# Patient Record
Sex: Female | Born: 1935 | Race: White | Hispanic: No | Smoking: Never smoker
Health system: Southern US, Community
[De-identification: ages and names within clinical notes are randomized; demographics above are authoritative.]

## PROBLEM LIST (undated history)

## (undated) DIAGNOSIS — F439 Reaction to severe stress, unspecified: Secondary | ICD-10-CM

## (undated) DIAGNOSIS — R55 Syncope and collapse: Secondary | ICD-10-CM

## (undated) DIAGNOSIS — M549 Dorsalgia, unspecified: Secondary | ICD-10-CM

## (undated) DIAGNOSIS — J42 Unspecified chronic bronchitis: Secondary | ICD-10-CM

## (undated) DIAGNOSIS — C4490 Unspecified malignant neoplasm of skin, unspecified: Secondary | ICD-10-CM

## (undated) DIAGNOSIS — F329 Major depressive disorder, single episode, unspecified: Secondary | ICD-10-CM

## (undated) DIAGNOSIS — R002 Palpitations: Secondary | ICD-10-CM

## (undated) DIAGNOSIS — R42 Dizziness and giddiness: Secondary | ICD-10-CM

## (undated) DIAGNOSIS — R5383 Other fatigue: Secondary | ICD-10-CM

## (undated) DIAGNOSIS — I10 Essential (primary) hypertension: Secondary | ICD-10-CM

## (undated) DIAGNOSIS — F32A Depression, unspecified: Secondary | ICD-10-CM

## (undated) DIAGNOSIS — R200 Anesthesia of skin: Secondary | ICD-10-CM

## (undated) HISTORY — DX: Reaction to severe stress, unspecified: F43.9

## (undated) HISTORY — DX: Other fatigue: R53.83

## (undated) HISTORY — DX: Essential (primary) hypertension: I10

## (undated) HISTORY — DX: Depression, unspecified: F32.A

## (undated) HISTORY — DX: Anesthesia of skin: R20.0

## (undated) HISTORY — DX: Unspecified chronic bronchitis: J42

## (undated) HISTORY — DX: Syncope and collapse: R55

## (undated) HISTORY — DX: Unspecified malignant neoplasm of skin, unspecified: C44.90

## (undated) HISTORY — DX: Dizziness and giddiness: R42

## (undated) HISTORY — PX: APPENDECTOMY: SHX54

## (undated) HISTORY — DX: Dorsalgia, unspecified: M54.9

## (undated) HISTORY — DX: Palpitations: R00.2

## (undated) HISTORY — DX: Major depressive disorder, single episode, unspecified: F32.9

---

## 1986-03-16 DIAGNOSIS — C4491 Basal cell carcinoma of skin, unspecified: Secondary | ICD-10-CM

## 1986-03-16 HISTORY — DX: Basal cell carcinoma of skin, unspecified: C44.91

## 1995-07-11 DIAGNOSIS — C4491 Basal cell carcinoma of skin, unspecified: Secondary | ICD-10-CM

## 1995-07-11 HISTORY — DX: Basal cell carcinoma of skin, unspecified: C44.91

## 1998-06-08 ENCOUNTER — Other Ambulatory Visit: Admission: RE | Admit: 1998-06-08 | Discharge: 1998-06-08 | Payer: Self-pay | Admitting: Obstetrics and Gynecology

## 1998-07-28 ENCOUNTER — Encounter: Payer: Self-pay | Admitting: Emergency Medicine

## 1998-07-28 ENCOUNTER — Emergency Department (HOSPITAL_COMMUNITY): Admission: EM | Admit: 1998-07-28 | Discharge: 1998-07-28 | Payer: Self-pay | Admitting: Emergency Medicine

## 1998-08-02 ENCOUNTER — Encounter: Payer: Self-pay | Admitting: Emergency Medicine

## 1998-08-02 ENCOUNTER — Emergency Department (HOSPITAL_COMMUNITY): Admission: EM | Admit: 1998-08-02 | Discharge: 1998-08-02 | Payer: Self-pay

## 1998-11-17 ENCOUNTER — Encounter: Admission: RE | Admit: 1998-11-17 | Discharge: 1998-12-17 | Payer: Self-pay | Admitting: Neurology

## 2000-03-20 ENCOUNTER — Encounter: Payer: Self-pay | Admitting: Family Medicine

## 2000-03-20 ENCOUNTER — Encounter: Admission: RE | Admit: 2000-03-20 | Discharge: 2000-03-20 | Payer: Self-pay | Admitting: Family Medicine

## 2000-03-30 ENCOUNTER — Encounter: Payer: Self-pay | Admitting: Orthopedic Surgery

## 2000-03-30 ENCOUNTER — Encounter: Admission: RE | Admit: 2000-03-30 | Discharge: 2000-03-30 | Payer: Self-pay | Admitting: Orthopedic Surgery

## 2000-06-21 ENCOUNTER — Other Ambulatory Visit: Admission: RE | Admit: 2000-06-21 | Discharge: 2000-06-21 | Payer: Self-pay | Admitting: Obstetrics and Gynecology

## 2001-01-24 ENCOUNTER — Encounter: Admission: RE | Admit: 2001-01-24 | Discharge: 2001-01-24 | Payer: Self-pay | Admitting: *Deleted

## 2001-01-24 ENCOUNTER — Encounter: Payer: Self-pay | Admitting: *Deleted

## 2001-07-29 ENCOUNTER — Other Ambulatory Visit: Admission: RE | Admit: 2001-07-29 | Discharge: 2001-07-29 | Payer: Self-pay | Admitting: Obstetrics and Gynecology

## 2003-07-24 ENCOUNTER — Encounter: Admission: RE | Admit: 2003-07-24 | Discharge: 2003-07-24 | Payer: Self-pay | Admitting: Internal Medicine

## 2003-08-06 ENCOUNTER — Encounter: Admission: RE | Admit: 2003-08-06 | Discharge: 2003-08-06 | Payer: Self-pay | Admitting: Internal Medicine

## 2004-01-06 ENCOUNTER — Encounter: Admission: RE | Admit: 2004-01-06 | Discharge: 2004-01-06 | Payer: Self-pay | Admitting: Internal Medicine

## 2004-10-12 ENCOUNTER — Encounter: Admission: RE | Admit: 2004-10-12 | Discharge: 2004-10-12 | Payer: Self-pay | Admitting: Family Medicine

## 2004-11-13 ENCOUNTER — Inpatient Hospital Stay (HOSPITAL_COMMUNITY): Admission: EM | Admit: 2004-11-13 | Discharge: 2004-11-15 | Payer: Self-pay | Admitting: *Deleted

## 2004-11-14 ENCOUNTER — Encounter (INDEPENDENT_AMBULATORY_CARE_PROVIDER_SITE_OTHER): Payer: Self-pay | Admitting: *Deleted

## 2005-05-15 ENCOUNTER — Ambulatory Visit (HOSPITAL_COMMUNITY): Admission: RE | Admit: 2005-05-15 | Discharge: 2005-05-15 | Payer: Self-pay | Admitting: Neurosurgery

## 2005-07-07 ENCOUNTER — Ambulatory Visit: Payer: Self-pay | Admitting: Emergency Medicine

## 2005-07-14 ENCOUNTER — Encounter: Admission: RE | Admit: 2005-07-14 | Discharge: 2005-07-14 | Payer: Self-pay | Admitting: Gastroenterology

## 2005-08-14 ENCOUNTER — Encounter: Admission: RE | Admit: 2005-08-14 | Discharge: 2005-08-14 | Payer: Self-pay | Admitting: Gastroenterology

## 2005-09-01 ENCOUNTER — Ambulatory Visit: Admission: RE | Admit: 2005-09-01 | Discharge: 2005-09-01 | Payer: Self-pay | Admitting: Gynecology

## 2005-09-06 ENCOUNTER — Encounter (INDEPENDENT_AMBULATORY_CARE_PROVIDER_SITE_OTHER): Payer: Self-pay | Admitting: *Deleted

## 2005-09-06 ENCOUNTER — Ambulatory Visit (HOSPITAL_COMMUNITY): Admission: RE | Admit: 2005-09-06 | Discharge: 2005-09-06 | Payer: Self-pay | Admitting: Obstetrics and Gynecology

## 2005-09-21 ENCOUNTER — Encounter: Admission: RE | Admit: 2005-09-21 | Discharge: 2005-09-21 | Payer: Self-pay | Admitting: Gastroenterology

## 2005-09-27 ENCOUNTER — Ambulatory Visit: Payer: Self-pay | Admitting: Internal Medicine

## 2005-09-29 ENCOUNTER — Ambulatory Visit: Payer: Self-pay | Admitting: Cardiology

## 2005-10-27 ENCOUNTER — Ambulatory Visit: Payer: Self-pay | Admitting: Internal Medicine

## 2006-01-25 ENCOUNTER — Ambulatory Visit: Payer: Self-pay | Admitting: Internal Medicine

## 2006-05-15 ENCOUNTER — Encounter: Admission: RE | Admit: 2006-05-15 | Discharge: 2006-05-15 | Payer: Self-pay | Admitting: Orthopedic Surgery

## 2006-08-28 ENCOUNTER — Ambulatory Visit: Payer: Self-pay | Admitting: Internal Medicine

## 2007-08-28 DIAGNOSIS — R059 Cough, unspecified: Secondary | ICD-10-CM | POA: Insufficient documentation

## 2007-08-28 DIAGNOSIS — R05 Cough: Secondary | ICD-10-CM

## 2007-08-28 DIAGNOSIS — J479 Bronchiectasis, uncomplicated: Secondary | ICD-10-CM

## 2007-08-28 HISTORY — DX: Bronchiectasis, uncomplicated: J47.9

## 2007-08-29 ENCOUNTER — Ambulatory Visit: Payer: Self-pay | Admitting: Internal Medicine

## 2007-09-04 ENCOUNTER — Telehealth (INDEPENDENT_AMBULATORY_CARE_PROVIDER_SITE_OTHER): Payer: Self-pay | Admitting: *Deleted

## 2007-09-16 ENCOUNTER — Ambulatory Visit: Payer: Self-pay | Admitting: Internal Medicine

## 2007-09-30 ENCOUNTER — Encounter (INDEPENDENT_AMBULATORY_CARE_PROVIDER_SITE_OTHER): Payer: Self-pay | Admitting: *Deleted

## 2007-10-01 ENCOUNTER — Ambulatory Visit: Payer: Self-pay | Admitting: Pulmonary Disease

## 2007-10-16 ENCOUNTER — Ambulatory Visit: Payer: Self-pay | Admitting: Internal Medicine

## 2007-12-26 ENCOUNTER — Ambulatory Visit (HOSPITAL_COMMUNITY): Admission: RE | Admit: 2007-12-26 | Discharge: 2007-12-26 | Payer: Self-pay | Admitting: Gastroenterology

## 2007-12-26 ENCOUNTER — Encounter (INDEPENDENT_AMBULATORY_CARE_PROVIDER_SITE_OTHER): Payer: Self-pay | Admitting: Diagnostic Radiology

## 2008-11-19 ENCOUNTER — Ambulatory Visit (HOSPITAL_COMMUNITY): Admission: RE | Admit: 2008-11-19 | Discharge: 2008-11-19 | Payer: Self-pay | Admitting: Urology

## 2008-12-10 ENCOUNTER — Encounter: Admission: RE | Admit: 2008-12-10 | Discharge: 2008-12-10 | Payer: Self-pay | Admitting: Neurosurgery

## 2009-01-11 ENCOUNTER — Encounter: Admission: RE | Admit: 2009-01-11 | Discharge: 2009-01-11 | Payer: Self-pay | Admitting: Neurosurgery

## 2009-01-28 ENCOUNTER — Encounter: Admission: RE | Admit: 2009-01-28 | Discharge: 2009-01-28 | Payer: Self-pay | Admitting: Neurosurgery

## 2009-03-09 ENCOUNTER — Encounter: Admission: RE | Admit: 2009-03-09 | Discharge: 2009-03-09 | Payer: Self-pay | Admitting: Urology

## 2009-04-21 ENCOUNTER — Ambulatory Visit (HOSPITAL_COMMUNITY): Admission: RE | Admit: 2009-04-21 | Discharge: 2009-04-21 | Payer: Self-pay | Admitting: Diagnostic Radiology

## 2009-04-27 ENCOUNTER — Telehealth (INDEPENDENT_AMBULATORY_CARE_PROVIDER_SITE_OTHER): Payer: Self-pay | Admitting: *Deleted

## 2009-04-28 ENCOUNTER — Ambulatory Visit: Payer: Self-pay | Admitting: Internal Medicine

## 2010-01-17 ENCOUNTER — Ambulatory Visit: Payer: Self-pay | Admitting: Cardiovascular Disease

## 2010-04-04 ENCOUNTER — Ambulatory Visit: Payer: Self-pay | Admitting: Cardiovascular Disease

## 2010-07-26 ENCOUNTER — Other Ambulatory Visit: Payer: Self-pay | Admitting: Gastroenterology

## 2010-07-26 DIAGNOSIS — R1012 Left upper quadrant pain: Secondary | ICD-10-CM

## 2010-07-29 ENCOUNTER — Ambulatory Visit
Admission: RE | Admit: 2010-07-29 | Discharge: 2010-07-29 | Disposition: A | Discharge status: Home / Self Care | Payer: Medicare Other | Source: Ambulatory Visit | Attending: Gastroenterology | Admitting: Gastroenterology

## 2010-07-29 DIAGNOSIS — R1012 Left upper quadrant pain: Secondary | ICD-10-CM

## 2010-07-29 MED ORDER — IOHEXOL 300 MG/ML  SOLN
100.0000 mL | Freq: Once | INTRAMUSCULAR | Status: AC | PRN
  Administered 2010-07-29: 100 mL via INTRAVENOUS

## 2010-08-09 ENCOUNTER — Ambulatory Visit: Payer: Medicare Other | Admitting: Internal Medicine

## 2010-08-18 ENCOUNTER — Other Ambulatory Visit: Payer: Self-pay | Admitting: Internal Medicine

## 2010-08-18 ENCOUNTER — Encounter: Payer: Self-pay | Admitting: Internal Medicine

## 2010-08-18 ENCOUNTER — Ambulatory Visit (INDEPENDENT_AMBULATORY_CARE_PROVIDER_SITE_OTHER): Payer: Medicare Other | Admitting: Internal Medicine

## 2010-08-18 ENCOUNTER — Ambulatory Visit (INDEPENDENT_AMBULATORY_CARE_PROVIDER_SITE_OTHER)
Admission: RE | Admit: 2010-08-18 | Discharge: 2010-08-18 | Disposition: A | Discharge status: Home / Self Care | Payer: Medicare Other | Source: Ambulatory Visit | Attending: Internal Medicine | Admitting: Internal Medicine

## 2010-08-18 ENCOUNTER — Other Ambulatory Visit: Payer: Medicare Other

## 2010-08-18 DIAGNOSIS — R05 Cough: Secondary | ICD-10-CM

## 2010-08-18 DIAGNOSIS — J479 Bronchiectasis, uncomplicated: Secondary | ICD-10-CM

## 2010-08-18 DIAGNOSIS — R053 Chronic cough: Secondary | ICD-10-CM

## 2010-08-18 DIAGNOSIS — R059 Cough, unspecified: Secondary | ICD-10-CM

## 2010-08-18 LAB — CBC WITH DIFFERENTIAL/PLATELET
Basophils Relative: 0.2 % (ref 0.0–3.0)
Eosinophils Relative: 2.8 % (ref 0.0–5.0)
HCT: 40.3 % (ref 36.0–46.0)
Lymphs Abs: 1.7 10*3/uL (ref 0.7–4.0)
MCV: 93.2 fl (ref 78.0–100.0)
Monocytes Absolute: 0.4 10*3/uL (ref 0.1–1.0)
Neutrophils Relative %: 65.7 % (ref 43.0–77.0)
RBC: 4.33 Mil/uL (ref 3.87–5.11)
WBC: 6.7 10*3/uL (ref 4.5–10.5)

## 2010-08-18 LAB — CONVERTED CEMR LAB: IgE (Immunoglobulin E), Serum: 2 intl units/mL (ref 0.0–180.0)

## 2010-08-23 NOTE — Assessment & Plan Note (Signed)
Summary: Pulmonary/ ext f/u ovfor bronchiectasis   Primary Provider/Referring Provider:  Izola Price  CC:  Cough and left side pain.  History of Present Illness: 11 yowf  never smoker  with recurrent cough  since age 77 with right middle lobe/lingular syndrome with limited bronchiectatic changes on her most recent CT study dated 09/2005   April 28, 2009 Acute visit.  Pt c/o dry cough worse since September occ yellow.  rec:  Levaquin 750 x 5 days Prednisone 4 each am x 2days, 2x2days, 1x2days and stop Vicodin 5  one every 4 hours for excess cough or pain Pneumonia shot today. Last one you'll ever need unless the CDC guidelines change Flu shot  today.   August 18, 2010 ov for  bronchiectasis / chronic cough worse x 6 months and LUQ pain worse when lie down present 24 hours per day x year already neg abd CT Scan >  mucus is clear assoc with nasal drainage also clear, worse in ams with mild nasal congestion.    Pt denies any significant sore throat, dysphagia, itching, sneezing,    fever, chills, sweats, unintended wt loss, classically pleuritic or exertional cp, hempoptysis, change in activity tolerance  orthopnea pnd or leg swelling. Pt also denies any obvious fluctuation in symptoms with weather or environmental change or other alleviating or aggravating factors.           Current Medications (verified): 1)  Adult Aspirin Low Strength 81 Mg  Tbdp (Aspirin) .... Once Daily 2)  Multivitamins   Tabs (Multiple Vitamin) .... Once Daily 3)  Vitamin D 2000 Unit  Tabs (Cholecalciferol) .... Once Daily 4)  Calcium 500 500 Mg  Tabs (Calcium Carbonate) .... Two Times A Day 5)  Tylenol 325 Mg Tabs (Acetaminophen) .... As Directed As Needed 6)  Vicodin 5-500 Mg Tabs (Hydrocodone-Acetaminophen) .... One Every 4 Hours As Needed  Allergies (verified): 1)  ! Pcn 2)  ! Sulfa  Past History:  Past Medical History: BRONCHIECTASIS (ICD-494.0) see CT scan of the chest dated 09/29/2005     - Pneumovax  April 28, 2009  (over 65 so last shot)     - Alpha one Screen sent August 18, 2010 >>>     - Allergy eval August 18, 2010 >>>     - Sinus CT August 18, 2010 >> neg  COUGH, CHRONIC (ICD-786.2)  Left Kidney Cyst        -  04/21/09 [facility]    PROCEDURE(S): ULTRASOUND GUIDED AND FLUOROSCOPIC GUIDED LEFT                      RENAL CYST ASPIRATION AND SCLEROSIS LUQ Abd pain 2011...............................................................Marland KitchenSchooler       - CT ABD  07/29/10  no etiology  Vital Signs:  Patient profile:   77 year old female Weight:      137 pounds BMI:     23.24 O2 Sat:      99 % on Room air Temp:     97.7 degrees F oral Pulse rate:   64 / minute BP sitting:   114 / 70  (left arm)  Vitals Entered By: Vernie Murders (August 18, 2010 2:29 PM)  O2 Flow:  Room air  Physical Exam  Additional Exam:  thin somewhat anxious but pleasant ambulatory white female in no acute distress. wt 137 > 137 April 28, 2009 > August 18, 2010 137  HEENT mild turbinate edema.  Oropharynx no thrush or excess pnd or cobblestoning.  No JVD or cervical adenopathy. Mild accessory muscle hypertrophy. Trachea midline, nl thryroid. Chest was hyperinflated by percussion with diminished breath sounds and moderate increased exp time without wheeze. Hoover sign positive at mid inspiration. Regular rate and rhythm without murmur gallop or rub or increase P2.  no edema Abd: no hsm, nl excursion. Ext warm without cyanosis or clubbing.       White Cell Count          6.7 K/uL                    4.5-10.5   Red Cell Count            4.33 Mil/uL                 3.87-5.11   Hemoglobin                14.0 g/dL                   16.1-09.6   Hematocrit                40.3 %                      36.0-46.0   MCV                       93.2 fl                     78.0-100.0   MCHC                      34.6 g/dL                   04.5-40.9   RDW                       12.9 %                      11.5-14.6   Platelet Count             203.0 K/uL                  150.0-400.0   Neutrophil %              65.7 %                      43.0-77.0   Lymphocyte %              25.4 %                      12.0-46.0   Monocyte %                5.9 %                       3.0-12.0   Eosinophils%              2.8 %                       0.0-5.0   Basophils %               0.2 %  0.0-3.0   Neutrophill Absolute      4.4 K/uL                    1.4-7.7   Lymphocyte Absolute       1.7 K/uL                    0.7-4.0   Monocyte Absolute         0.4 K/uL                    0.1-1.0  Eosinophils, Absolute                             0.2 K/uL                    0.0-0.7   Basophils Absolute        0.0 K/uL                    0.0-0.1  Tests: (2) Sed Rate (ESR)   Sed Rate                  10 mm/hr                    0-22  CXR  Procedure date:  08/18/2010  Findings:      Comparison: 04/28/2009, CT 09/29/2005  Findings: Right middle lobe scarring and bronchiectasis is unchanged.  There is also some mild scarring in the left lung base and in the right lung base.  No acute pneumonia.  Negative for heart failure.  IMPRESSION: COPD and scarring.  Right middle lobe bronchiectasis.  No superimposed pneumonia.   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   I had an extended discussion with the patient today lasting 15 to 20 minutes of a 25 minute visit on the following issues:   there has been no clinical or radiographic progression of her dz no hemoptysis/ sepsis or other complications to date using a relatively conservative approach  vs repeat fob/ search for MAI or resistant gnr involvement which may someday be necessary but not for now  Discussed in detail all the  indications, usual  risks and alternatives  relative to the  benefits with patient who agrees to proceed with conservative rx   Problem # 2:  COUGH, CHRONIC (ICD-786.2) The most common causes of chronic cough in immunocompetent adults include: upper airway cough syndrome (UACS), previously referred to as postnasal drip syndrome,  caused by variety of rhinosinus conditions; (2) asthma; (3) GERD; (4) chronic bronchitis from cigarette smoking or other inhaled environmental irritants; (5) nonasthmatic eosinophilic bronchitis; and (6) bronchiectasis. These conditions, singly or in combination, have accounted for up to 94% of the causes of chronic cough in prospective studies.   Most likely this is bronchiectasis related but could certainly be also  Classic Upper airway cough syndrome, so named because it's frequently impossible to sort out how much is  CR/sinusitis with freq throat clearing (which can be related to primary GERD)   vs  causing  secondary extra esophageal GERD from wide swings in gastric pressure that occur with throat clearing, promoting self use of mint and menthol lozenges that reduce the lower  esophageal sphincter tone and exacerbate the problem further These are the same pts who not infrequently have failed to tolerate ace inhibitors,  dry powder inhalers or biphosphonates or report having reflux symptoms that don't respond to standard doses of PPI   For now try to keep it simple by adding hs h2  and h1 and w/u for allergy with rast.  Medications Added to Medication List This Visit: 1)  Tagamet Hb 200 Mg Tabs (Cimetidine) .... 2 at bedtime 2)  Chlor-trimeton 4 Mg Tabs (Chlorpheniramine maleate) .... One at bedtime  Other Orders: T-2 View CXR (71020TC) TLB-Alpha-1-Antitrypsin Total ( Home Kit) MISC ([address]) T-Allergy Profile Region [place], [place], [place], [place], [place] (5484) Est. Patient Level IV ([address]454) Misc. Referral (Misc. Ref) TLB-CBC Platelet - w/Differential (85025-CBCD) TLB-Sedimentation Rate (ESR) (85652-ESR)  Patient Instructions: 1)   Chlortrimeton 4 mg one at bedtime 2)  Tagamet 400mg  at bedtime 3)  See Patient Care Coordinator before leaving for sinus ct 4)  Please schedule a follow-up appointment in 6 weeks, sooner if needed

## 2010-09-07 ENCOUNTER — Telehealth: Payer: Self-pay | Admitting: Internal Medicine

## 2010-09-07 LAB — CBC
HCT: 40.6 % (ref 36.0–46.0)
Hemoglobin: 13.9 g/dL (ref 12.0–15.0)
RDW: 12.7 % (ref 11.5–15.5)

## 2010-09-07 NOTE — Telephone Encounter (Signed)
Alpha 1 test was negative. ATC pt and inform her this.  LMTCB.

## 2010-09-08 ENCOUNTER — Encounter: Payer: Self-pay | Admitting: Internal Medicine

## 2010-09-08 NOTE — Telephone Encounter (Signed)
Returning [name]'s call ok to lmom.

## 2010-09-12 LAB — CBC
HCT: 45.3 % (ref 36.0–46.0)
MCV: 94.6 fL (ref 78.0–100.0)
Platelets: 230 10*3/uL (ref 150–400)
RBC: 4.8 MIL/uL (ref 3.87–5.11)
WBC: 7 10*3/uL (ref 4.0–10.5)

## 2010-09-12 NOTE — Telephone Encounter (Signed)
Called, spoke with pt.  She was informed alpha 1 test negative.  She verbalized understanding of this and voiced no further questions at this time.

## 2010-09-12 NOTE — Telephone Encounter (Signed)
Pt returned call from leslie. Cell [phone]. [name]

## 2010-09-27 ENCOUNTER — Encounter: Payer: Self-pay | Admitting: Internal Medicine

## 2010-09-29 ENCOUNTER — Ambulatory Visit (INDEPENDENT_AMBULATORY_CARE_PROVIDER_SITE_OTHER): Payer: Medicare Other | Admitting: Internal Medicine

## 2010-09-29 ENCOUNTER — Encounter: Payer: Self-pay | Admitting: Internal Medicine

## 2010-09-29 VITALS — BP 108/60 | HR 78 | Temp 97.8°F | Ht 64.0 in | Wt 137.0 lb

## 2010-09-29 DIAGNOSIS — J479 Bronchiectasis, uncomplicated: Secondary | ICD-10-CM

## 2010-09-29 NOTE — Patient Instructions (Signed)
GERD (REFLUX)  is an extremely common cause of respiratory symptoms, many times with no significant heartburn at all.    It can be treated with medication, but also with lifestyle changes including avoidance of late meals, excessive alcohol, smoking cessation, and avoid fatty foods, chocolate, peppermint, colas, red wine, and acidic juices such as orange juice.  NO MINT OR MENTHOL PRODUCTS SO NO COUGH DROPS  USE SUGARLESS CANDY INSTEAD (jolley ranchers or Stover's)  Or Lifefsavers NO OIL BASED VITAMINS   Return yearly for cxr  - if cough worsens use mucinex dm and if not satisfied return for further evaluation

## 2010-09-29 NOTE — Progress Notes (Signed)
  Subjective:    Patient ID: [name], female    DOB: 01/09/1933, 77 y.o.   MRN: [id number]  HPI Summary: Pulmonary/ ext f/u ovfor bronchiectasis  Primary Provider/Referring Provider: Izola Price    16  yowf never smoker with recurrent cough since age 72 with right middle lobe/lingular syndrome with limited bronchiectatic changes on her most recent CT study dated 09/2005   April 28, 2009 Acute visit. Pt c/o dry cough worse since September occ yellow.  rec: Levaquin 750 x 5 days  Prednisone 4 each am x 2days, 2x2days, 1x2days and stop  Vicodin 5 one every 4 hours for excess cough or pain  Pneumonia @ ov = last one unless the CDC guidelines change  Flu shot today.   August 18, 2010 ov for bronchiectasis / chronic cough worse x 6 months and LUQ pain worse when lie down present 24 hours per day x year already neg abd CT Scan > mucus is clear assoc with nasal drainage also clear, worse in ams with mild nasal congestion. Sinus ct neg so REC 1) Chlortrimeton 4 mg one at bedtime  2) Tagamet 400mg  at bedtime    09/29/2010 ov [name]/ cc cough much better,  No sob.   Pt denies any significant sore throat, dysphagia, itching, sneezing,  nasal congestion or excess/ purulent secretions,  fever, chills, sweats, unintended wt loss, pleuritic or exertional cp, hempoptysis, orthopnea pnd or leg swelling.    Also denies any obvious fluctuation of symptoms with weather or environmental changes or other aggravating or alleviating factors.     Past Medical History:  BRONCHIECTASIS (ICD-494.0) see CT scan of the chest dated 09/29/2005  - Pneumovax April 28, 2009 (over 67 so last shot)  - Alpha one Screen sent August 18, 2010 >>>  Neg - Sinus CT August 18, 2010 >>  neg  COUGH, CHRONIC (ICD-786.2)  Left Kidney Cyst  - 04/21/09 [facility] PROCEDURE(S): ULTRASOUND GUIDED AND FLUOROSCOPIC GUIDED LEFT RENAL CYST ASPIRATION AND SCLEROSIS  LUQ Abd pain  2011...............................................................Marland KitchenSchooler  - CT ABD 07/29/10 no etiology              Review of Systems     Objective:   Physical Exam    thin pleasant ambulatory white female in no acute distress.  wt 137 > 137 April 28, 2009 > August 18, 2010 137 >  137 09/29/2010  HEENT mild turbinate edema. Oropharynx no thrush or excess pnd or cobblestoning. No JVD or cervical adenopathy. Mild accessory muscle hypertrophy. Trachea midline, nl thryroid. Chest was hyperinflated by percussion with diminished breath sounds and moderate increased exp time without wheeze. Hoover sign positive at mid inspiration. Regular rate and rhythm without murmur gallop or rub or increase P2. no edema Abd: no hsm, nl excursion. Ext warm without cyanosis or clubbing.   08/18/10 data reviewed Sed Rate 10 mm/hr 0-22  CXR   Comparison: 04/28/2009, CT 09/29/2005  Findings: Right middle lobe scarring and bronchiectasis is  unchanged. There is also some mild scarring in the left lung base  and in the right lung base. No acute pneumonia. Negative for  heart failure.  IMPRESSION:  COPD and scarring. Right middle lobe bronchiectasis. No  superimposed pneumonia. Assessment & Plan:

## 2010-09-29 NOTE — Assessment & Plan Note (Signed)
Symptoms well controlled on present rx    Each maintenance medication was reviewed in detail including most importantly the difference between maintenance and as needed and under what circumstances the prns are to be used.  Please see instructions for details which were reviewed in writing and the patient given a copy.

## 2010-10-19 ENCOUNTER — Encounter: Payer: Self-pay | Admitting: Cardiovascular Disease

## 2010-10-19 DIAGNOSIS — R55 Syncope and collapse: Secondary | ICD-10-CM | POA: Insufficient documentation

## 2010-10-19 DIAGNOSIS — F439 Reaction to severe stress, unspecified: Secondary | ICD-10-CM | POA: Insufficient documentation

## 2010-10-19 DIAGNOSIS — J42 Unspecified chronic bronchitis: Secondary | ICD-10-CM | POA: Insufficient documentation

## 2010-10-19 DIAGNOSIS — F329 Major depressive disorder, single episode, unspecified: Secondary | ICD-10-CM | POA: Insufficient documentation

## 2010-10-19 DIAGNOSIS — I10 Essential (primary) hypertension: Secondary | ICD-10-CM | POA: Insufficient documentation

## 2010-10-19 DIAGNOSIS — R002 Palpitations: Secondary | ICD-10-CM | POA: Insufficient documentation

## 2010-10-19 DIAGNOSIS — R5383 Other fatigue: Secondary | ICD-10-CM | POA: Insufficient documentation

## 2010-10-19 DIAGNOSIS — M549 Dorsalgia, unspecified: Secondary | ICD-10-CM | POA: Insufficient documentation

## 2010-10-19 DIAGNOSIS — F32A Depression, unspecified: Secondary | ICD-10-CM | POA: Insufficient documentation

## 2010-10-19 DIAGNOSIS — C4490 Unspecified malignant neoplasm of skin, unspecified: Secondary | ICD-10-CM | POA: Insufficient documentation

## 2010-10-19 DIAGNOSIS — R42 Dizziness and giddiness: Secondary | ICD-10-CM | POA: Insufficient documentation

## 2010-10-19 DIAGNOSIS — R200 Anesthesia of skin: Secondary | ICD-10-CM | POA: Insufficient documentation

## 2010-10-20 ENCOUNTER — Encounter: Payer: Self-pay | Admitting: Cardiovascular Disease

## 2010-10-20 ENCOUNTER — Ambulatory Visit (INDEPENDENT_AMBULATORY_CARE_PROVIDER_SITE_OTHER): Payer: Medicare Other | Admitting: Cardiovascular Disease

## 2010-10-20 VITALS — BP 136/78 | HR 68 | Ht 64.0 in | Wt 136.4 lb

## 2010-10-20 DIAGNOSIS — R002 Palpitations: Secondary | ICD-10-CM

## 2010-10-20 NOTE — Patient Instructions (Signed)
Try Zertec for allergies.

## 2010-10-20 NOTE — Assessment & Plan Note (Signed)
She is doing very well. She has not had any significant palpitations. She would like to see me again in 6 months.

## 2010-10-20 NOTE — Progress Notes (Signed)
[name] Date of Birth  [birth year] [facility] [address]     [address] [place], [place]  [address] [phone]  Fax  [phone]  History of Present Illness:  [name] is an elderly female with a history of palpitations and history of dizziness. She has mild hypertension. She's done very well since I last saw her. She has not had any episodes of chest pain or shortness of breath. She has had some leg discomfort particularly on busy days.  Current Outpatient Prescriptions on File Prior to Visit  Medication Sig Dispense Refill  . aspirin 81 MG tablet Take 81 mg by mouth daily.        Marland Kitchen CALCIUM PO Take 1,500 mg by mouth daily.        . Cholecalciferol (VITAMIN D) 2000 UNITS CAPS Take by mouth daily.        . Multiple Vitamin (MULTIVITAMIN) tablet Take 1 tablet by mouth daily.        Marland Kitchen DISCONTD: chlorpheniramine (CHLOR-TRIMETON) 4 MG tablet Take 4 mg by mouth at bedtime.        Marland Kitchen DISCONTD: cimetidine (TAGAMET) 200 MG tablet 2 tablets at bedtime         Allergies  Allergen Reactions  . Penicillins   . Sulfonamide Derivatives     Past Medical History  Diagnosis Date  . Palpitations   . Syncope     History of   . Dizziness   . Hypertension     mild  . Back pain   . Depression   . Stress   . Fatigue   . Arm numbness left   . Skin cancer   . Bronchitis, chronic     Past Surgical History  Procedure Date  . Appendectomy     History  Smoking status  . Never Smoker   Smokeless tobacco  . Never Used    History  Alcohol Use  . Yes    social ETOH    Family History  Problem Relation Age of Onset  . Atrial fibrillation Mother   . Alcohol abuse Father     Reviw of Systems:  Reviewed in the HPI.  All other systems are negative.  Physical Exam: BP 136/78  Pulse 68  Ht 5\' 4"  (1.626 m)  Wt 136 lb 6.4 oz (61.871 kg)  BMI 23.41 kg/m2 The patient is alert and oriented x 3.  The mood and affect are normal.  The skin is  warm and dry.  Color is normal.  The HEENT exam reveals that the sclera are nonicteric.  The mucous membranes are moist.  The carotids are 2+ without bruits.  There is no thyromegaly.  There is no JVD.  The lungs are clear.  The chest wall is non tender.  The heart exam reveals a regular rate with a normal S1 and S2.  There are no murmurs, gallops, or rubs.  The PMI is not displaced.   Abdominal exam reveals good bowel sounds.  There is no guarding or rebound.  There is no hepatosplenomegaly or tenderness.  There are no masses.  Exam of the legs reveal no clubbing, cyanosis, or edema.  The legs are without rashes.  The distal pulses are intact.  Cranial nerves II - XII are intact.  Motor and sensory functions are intact.  The gait is normal.  Assessment / Plan:

## 2010-10-21 NOTE — Op Note (Signed)
[name], [name]             ACCOUNT NO.:  [id number]   MEDICAL RECORD NO.:  [id number]          PATIENT TYPE:  AMB   LOCATION:  SDC                           FACILITY:  [facility]   PHYSICIAN:  Sherry A. Dickstein, M.D.DATE OF BIRTH:  06-27-1932   DATE OF PROCEDURE:  09/06/2005  DATE OF DISCHARGE:                                 OPERATIVE REPORT   PREOPERATIVE DIAGNOSIS:  Left ovarian cyst, left abdominal pain.   POSTOPERATIVE DIAGNOSIS:  Left ovarian cyst, left abdominal pain.   PROCEDURE:  Diagnostic laparoscopy with bilateral salpingo-oophorectomy.   SURGEON:  Dr. Rosalio Macadamia.  Dr. Delia Heady.   ANESTHESIA:  General.   INDICATIONS:  This is a 77 year old G3, P 3-0-0-3 woman who has been having  increasing left lower quadrant pain with left lower back pain.  The patient  has had a known left ovarian cyst several years previously which apparently  disappeared on ultrasound; however, over the past several months, the  patient has had increased abdominal pain.  The patient recently had an  ultrasound which revealed a 3 cm left ovarian cyst.  This appeared to be  simple in nature.  The patient's right ovary had less than 1 cm ovarian  cyst.  The patient was sent for consultation with GYN oncologist for  possibility of having this performed at a center where a periaortic node  dissection could be performed if this were a malignancy.  Because of the  simple nature of the cyst and the unlikelihood that this were malignancy,  the patient declined having it done there.  Because of the size of the cyst  and the persistence of the pain, the patient is brought to the operating  room for bilateral salpingo-oophorectomy.   FINDINGS:  Normal sized anteflexed uterus with a 1 cm fibroid off of the  fundus.  The right fallopian tube and ovary normal, left fallopian tube  normal, left ovary approximately 4 to 5 cm in size.   PROCEDURE:  The patient is brought into the operating room, given  adequate  general anesthesia.  She is placed in dorsal lithotomy position.  Her  abdomen and perineum were washed with Betadine.  Her vagina was then washed  with Betadine.  A Foley catheter was inserted into the bladder and a pelvic  examination was performed.  A speculum placed within the vagina.  Single-  tooth Hulka tenaculum placed in the endocervical canal in anteflexed  fashion.  Surgeon's gown and gloves were changed.  The patient was draped in  sterile fashion.  Subumbilical area was infiltrated with 0.25%  Marcaine and  incision was made in the subumbilical area.  This was brought down to the  fascia.  The fascia was grasped and was incised sharply.  Peritoneal cavity  was entered sharply.  The fascial edges were identified and a pursestring  stitch was taken circumferentially.  The Hasson sleeve was introduced into  the peritoneal cavity and it was cinched down with the pursestring stitch.  Carbon dioxide was insufflated.  The pelvis was inspected by placing the  laparoscope.  Pictures were obtained.  A right  5 mm port was placed after  infiltrating with 0.25% Marcaine.  Incision was made.  The trocar was  introduced under direct visualization.  A left 11 mm incision was made after  infiltrating with 0.25% Marcaine.  Under direct visualization, a 11 mm  trocar was placed.  500 mL of Ringer's lactate was introduced into the  peritoneal cavity.  A washings were obtained approximately 400 mL of fluid  were removed.  The pelvis was completely inspected.  Both ureters were  identified.  The right infundibulopelvic ligament was identified.  It was  cauterized with tripolar cautery.  It was cut. The right adnexa was then  continued be dissected using tripolar cautery and cutting in this fashion  over to the utero-ovarian ligament.  This was cut and cauterized.  The right  tube and ovary was then dropped in the cul-de-sac. There were few adhesions  of the bowel underneath the uterus.   These were just dissected free with  blunt dissection.  The left infundibulopelvic ligament was then felt to be  freed enough to cauterize.  It was clamped with tripolar, it was cauterized  x3 and cut in the middle, then it was continued to be cauterized and cut  over to the utero-ovarian ligament and it was cauterized and cut in this  fashion until it was completely free.  There were some small bleeders along  the cauterized portion.  This was very carefully cauterized and it was  completely free from the bowel while it was being cauterized.  Adequate  hemostasis was present throughout this area.  The left tube and ovary was  then placed in EndoCatch bag. It was brought out through the 11 mm port on  the left.  As was brought out, the fluid was drained off.  It was felt to be  very serous clear fluid and then the tube and ovary was able to be removed  easily through the port.  A new EndoCatch bag was then replaced into the  abdomen and right tube and ovary was placed within this bag.  It was easily  brought out through the port.  The trocar was then replaced.  The pelvis was  completely inspected.  There was adequate hemostasis throughout.  The pelvis  was irrigated.  All the fluid was removed that could be removed.  Approximately 40 mL of 0.25% Marcaine was left in the abdominal cavity for  pain relief postoperatively.  The 11 mm sleeve was removed.  Kocher clamps  were placed on the fascia.  The fascia was closed with a 0 Vicryl figure-of-  eight stitch.  The carbon dioxide was allowed to escape and under direct  visualization, the laparoscope was removed.  The umbilical incision was  closed over a finger to assure that no bowel was caught in the umbilical  stitch.  The pursestring stitch that had been placed was cinched down and  closed.  The skin incisions were closed with 4-0 Vicryl subcuticular running  stitches.  All incisions were then closed with Dermabond.  It was felt  that adequate hemostasis was present throughout.  The tenaculum was removed from  the cervix.  The Foley catheter was to be removed in the recovery room.  The  patient was taken out of dorsal lithotomy position.  She was awakened.  She  was extubated.  She was moved from the operating table to a stretcher in  stable condition.  Complications were none.  Estimated blood loss less than  50 mL.   SPECIMEN:  1.  Right tube and ovary.  2.  Left tube and ovary.      Sherry A. Rosalio Macadamia, M.D.  Electronically Signed     SAD/MEDQ  D:  09/06/2005  T:  09/07/2005  Job:  [id number]   cc:   De Blanch, M.D.  [address]  [place]  [place] [address]

## 2010-10-21 NOTE — Discharge Summary (Signed)
NAMETEEGHAN, HAMMER             ACCOUNT NO.:  [id number]   MEDICAL RECORD NO.:  [id number]          PATIENT TYPE:  INP   LOCATION:  1618                         FACILITY:  [facility]   PHYSICIAN:  Hollice Espy, M.D.DATE OF BIRTH:  29-May-1933   DATE OF ADMISSION:  11/13/2004  DATE OF DISCHARGE:  11/15/2004                                 DISCHARGE SUMMARY   CONSULTANTS ON THIS CASE:  Althea Grimmer. Luther Parody, M.D., [facility].   PRIMARY CARE PHYSICIAN:  [name], M.D.   DISCHARGE DIAGNOSES:  1.  Ischemic colitis.  2.  General complaints of joint pain.   DISCHARGE MEDICATIONS:  Aspirin 325 mg 1 p.o. daily.   HOSPITAL COURSE:  The patient is a 77 year old white female with essentially  no significant past medical history who presented with worsening abdominal  pain in the left lower quadrant as well as some rectal bleeding on the day  of admission.  She came in for further evaluation.  A CT of the abdomen and  pelvis confirmed colitis within the transverse distal region as well as in  the sigmoid area.  The patient was made n.p.o., started on IV antibiotics  for possible infectious colitis.  Stool cultures were sent, which ended up  being negative.  [facility], Dr. Luther Parody, came by to evaluate the patient,  and she underwent a colonoscopy on November 14, 2004.  At that time, he found  evidence of ischemic colitis from the transverse to the descending colon,  suspected erosions, bleeding, and granularity were present with those  ulcers.  Noted some mild friability and muscle-sparing biopsies were taken.  Patient otherwise following the CT is feeling a little bit better although  still quite fatigued.  Dr. Luther Parody recommended starting the patient on a  daily aspirin and discharging her home on a low-residue diet.  The patient  was given information about this diet, which she understands and will  follow.  Plan will be for the patient to be discharged to home.   OVERALL DISPOSITION:   Improved.   She will follow up with Dr. Luther Parody in his office in two to three weeks.  Following that, she will follow up with her PCP, Dr. Lenise Arena, in  approximately four weeks.  The patient, prior to these issues, has told me  for the last month to two months she has been complaining of problems with  pain and numbness over the left side of her body, with involvement of the  head all the way down to her feet.  She also is complaining of significantly  of some left shoulder  pain and bilateral hip pain.  Sed rate was checked, which was found to  actually be normal at 2, thereby likely ruling out a rheumatologic cause.  Dr. Lenise Arena, after this colitis has settled down, may want to follow up on  this to pursue other possible avenues for this.       SKK/MEDQ  D:  11/15/2004  T:  11/15/2004  Job:  [id number]   cc:   [name], M.D.  [address]  [place] [address]  Fax: [phone]   Althea Grimmer. Luther Parody, M.D.  [address]  [place]  [place] [address]  Fax: [phone]

## 2010-10-21 NOTE — Assessment & Plan Note (Signed)
[name]                               PULMONARY OFFICE NOTE   [name], [name]                      MRN:          [id number]  DATE:01/25/2006                            DOB:          [birth year]    HISTORY:  A 77 year old white female with documented bronchiectasis with  bilateral chronic infiltrates involving the right middle lobe and lingula  for which I contemplate the possibility of MAI or resistant organism  infection.  However, the patient has done quite well since her previous  visit using Mucinex DM two b.i.d. and Cipro 750 b.i.d. for 7 days p.r.n.  (she rarely needs either of these, however).  She denies any dyspnea or  chest pain.  She __________ a significant cough.   PHYSICAL EXAMINATION:  GENERAL APPEARANCE:  She is a pleasant ambulatory  white female in no acute distress.  VITAL SIGNS:  Afebrile with stable vital signs.  HEENT:  Unremarkable.  Oropharynx clear.  LUNGS:  Lung fields reveal slightly diminished breath sounds but no  wheezing.  CARDIOVASCULAR:  There is regular rhythm without murmurs, rubs, or gallops.  ABDOMEN:  Soft and benign.  EXTREMITIES:  Warm without calf tenderness, clubbing, cyanosis, or edema.   PFTs performed today and reviewed with the patient and indicated an FEV1 and  FVC ratio of 67% with no improvement after bronchodilators, diffusing  capacity of 81%.   Chest x-ray reveals scarring in the lingula and right middle lobe.   IMPRESSION:  Well compensated bronchiectasis with minimal air flow  obstruction and therefore does not need more aggressive treatment at this  point.   I did recommend follow-up in February of 2008, for an annual chest x-ray to  track the changes that are seen but in the absence of refractory purulent  sputum or increased dyspnea, I do not believe there is any change in regimen  needed.                                   Charlaine Dalton. Sherene Sires, MD, [facility]   MBW/MedQ  DD:   01/25/2006  DT:  01/26/2006  Job #:  [id number]   cc:   [name], MD

## 2010-10-21 NOTE — Consult Note (Signed)
NAMEPERRI, ARAGONES             ACCOUNT NO.:  [id number]   MEDICAL RECORD NO.:  [id number]          PATIENT TYPE:  INP   LOCATION:  1618                         FACILITY:  [facility]   PHYSICIAN:  Althea Grimmer. Santogade, M.D.DATE OF BIRTH:  May 25, 1933   DATE OF CONSULTATION:  11/13/2004  DATE OF DISCHARGE:                                   CONSULTATION   HISTORY OF PRESENT ILLNESS:  Ms. Logsdon is a 77 year old female who was in  her reasonably good state of health until early this morning when she had  cramping lower abdominal pain and three attacks of diarrhea which she  describes as bright red blood mixed with the stool. She has had chronic left  lower quadrant abdominal discomfort previously evaluated with CT scans and  these were negative. She had an appointment with me this week to set up  colonoscopy but came to the emergency room because of recurrent symptoms.   In the ER, she had a CT scan that demonstrates thick wall distal transverse  and sigmoid colon. She denies any recent travel, any ill associates, any  fevers or chills. She has been recently seeing Dr. Genene Churn. Love for left-  sided body numbness and pain and he just placed her on Relafen but she is  only taking two doses. She has not had any recent antibiotics. She denies  weight loss or upper GI symptoms.   PAST MEDICAL HISTORY:  Pertinent for cervical and lumbar disc disease and a  reported heart murmur.   CURRENT MEDICATIONS:  Relafen, Excedrin, and Vicodin. She is supposed to be  taking a baby aspirin which she usually forgets by her report.   ALLERGIES:  PENICILLIN and SULFA.   FAMILY HISTORY:  Married Advertising account planner, nonsmoker and nondrinker.   REVIEW OF SYMPTOMS:  GENERAL:  No weight loss or night sweats. ENDOCRINE:  No history of diabetes or thyroid problems. SKIN:  No rash or pruritus.  EYES:  No icterus or change in vision. ENT:  No aphthous ulcers or chronic  sore throat. RESPIRATORY:  No shortness of  breath, cough, or wheezing.  CARDIAC:  Reported murmur. No chest pain. GI:  As above. GU:  No dysuria or  hematuria.   FAMILY HISTORY:  Mother who is a patient of mine had nonsteroidal-induced  gastrointestinal bleeding. Her uncle has Crohn's colitis. Her father had  ulcers. No one has had colonic neoplasia.   PHYSICAL EXAMINATION:  GENERAL:  She is in no acute distress.  VITAL SIGNS:  Afebrile. Blood pressure 119/67, pulse 73 and regular.  SKIN:  Normal.  HEENT:  Eyes are anicteric. Conjunctivae are pink. Oropharynx unremarkable.  CHEST:  Sounds clear.  HEART:  Regular rate and rhythm. I do not appreciate a murmur.  ABDOMEN:  She has audible bowel sounds. Soft with active bowel sounds. There  is minimal left lower quadrant tenderness to deep palpation. There is no  mass, rebound, or hernia.  RECTAL:  Not performed.  EXTREMITIES:  Without clubbing, cyanosis, edema, or rash.   LABORATORY DATA:  Hemoglobin 14, white blood count 7.9, platelet count  normal. Sedimentation rate  2. Electrolytes and liver function tests normal.  Albumin 3.3.   IMPRESSION:  Left-sided colitis in a 77 year old female. I doubt this is due  to the new institution of nonsteroidals since she is only taking two pills.  I suspect this is ischemic. There is no risk factor for C. difficile. She  does have a family history of Crohn's and nonsteroidal induced bowel disease  and Crohn's does remain a possibility.   RECOMMENDATIONS:  I think she requires a colonoscopy on this admission. I am  uncertain of the timing since the schedule is very packed tomorrow. I would  keep her on clear liquids and continuous low dose of MiraLax to potentially  keep her cleaned out when ever we can schedule it on Monday or Tuesday.  Please see the orders.       PJS/MEDQ  D:  11/13/2004  T:  11/13/2004  Job:  [id number]   cc:   Theone Stanley, MD   [name], M.D.  [address] [place]  [place] [address]  Fax:  [phone]

## 2010-10-21 NOTE — Assessment & Plan Note (Signed)
[facility]                             PULMONARY OFFICE NOTE   Rita, Lane                      MRN:          [id number]  DATE:08/28/2006                            DOB:          03-27-1933    HISTORY:  A 77 year old white female with right middle lobe/lingular  syndrome with limited bronchiectatic changes on her most recent study  dated August 2007.  She returns today continuing to use Cipro and  Mucinex on a p.r.n. basis but has not needed any since her last visit in  August 2007.  She is complaining of jaw pain for which she saw a  dentist yesterday who apparently did x-rays but was not convinced the  pain was related to her teeth.  She denies any difficulty with chewing  or hearing or obvious nasal or sinus syndromes.   She also denies any pleuritic pain, purulent sputum, fever, chills,  sweats, weight loss, or dyspnea.   She is on no maintenance medications, no pulmonary medications at all at  this point.   PHYSICAL EXAMINATION:  GENERAL:  She is a pleasant healthy-appearing  white female in no acute distress.  VITAL SIGNS:  Stable.  HEENT:  Unremarkable.  Pharynx clear.  Dentition is intact.  There was  no tenderness over the TMJ on the right, and ear canal was also  completely normal including the tympanic membrane.  There was no  associated adenopathy.  LUNGS:  Lung fields are completely clear bilaterally to auscultation and  percussion.  HEART:  Regular rhythm without murmur, gallop, or rub.  ABDOMEN:  Soft, benign.  EXTREMITIES:  Normal without calf tenderness, cyanosis, clubbing or  edema.   IMPRESSION:  1. Right jaw pain, acute onset over the last week, probably most      likely dental related but no evidence of active upper respiratory      tract infection including no evidence of otitis or sinusitis      clinically.  2. Silent bronchiectasis involving the right middle lobe and      lingula.  She is certainly at risk  for exacerbations but is doing      great on a very consistent regimen and has no evidence of      significant airflow obstruction warranting more aggressive therapy,      as documented on her previous study dated January 25, 2006.  Most      likely, this is attributed to the fact that she has never smoked.  3. Followup should be yearly with yearly chest x-ray, therefore, which      we completed today, the results of which are pending.    [name]. Rita Sires, MD, [facility]  Electronically Signed   MBW/MedQ  DD: 08/28/2006  DT: 08/28/2006  Job #: [id number]   cc:   [name], M.D.

## 2010-10-21 NOTE — Consult Note (Signed)
[name], [name]             ACCOUNT NO.:  [id number]   MEDICAL RECORD NO.:  [id number]          PATIENT TYPE:  OUT   LOCATION:  GYN                          FACILITY:  [facility]   PHYSICIAN:  De Blanch, M.D.DATE OF BIRTH:  1932/11/30   DATE OF CONSULTATION:  09/01/2005  DATE OF DISCHARGE:  09/01/2005                                   CONSULTATION   CHIEF COMPLAINT:  Ovarian cyst.   HISTORY OF PRESENT ILLNESS:  A 77 year old white female seen in consultation  at the request of Dr. Floyde Parkins regarding management of bilateral  ovarian cysts.  The patient apparently has had ovarian cysts recognized as  far back as 2004 on ultrasound.  Apparently she had an ultrasound in 2005  which showed resolution of the cysts.   More recently, the patient has had an achy left lower quadrant pain and  has undergone GI workup.  The details of that workup are not available but  she claims the only thing noted was diverticulosis.  In addition, the  patient has had an ultrasound obtained which shows the left ovary to have  contained a simple cyst measuring 3.3 x 2.2 x 2.2 cm and a small 1 cm cyst  in the right ovary.  There is no increased blood flow with  the Doppler  studies and no free fluid noted in the cul-de-sac.  CA125 was also obtained  and is 7 units per mL.   The patient denies any vaginal bleeding or discharge and has no apparent  uterine problems.   PAST MEDICAL HISTORY:  Medical illnesses:  The patient claims to have a  heart murmur.   PAST SURGICAL HISTORY:  1.  Cesarean section x 3.  2.  Laparotomy for ruptured appendix.  3.  Tonsils and adenoidectomy.   CURRENT MEDICATIONS:  Baby aspirin.   DRUG ALLERGIES:  PENICILLIN AND SULFA.   FAMILY HISTORY:  Cousin with lung cancer and a cousin with colon cancer.   SOCIAL HISTORY:  The patient is separated for 27 years.  She remains active  as a Scientist, water quality.  She does not smoke.   REVIEW OF  SYSTEMS:  A 10-point comprehensive review of systems negative  except as noted above.   PHYSICAL EXAM:  Weight 132 pounds, height 5 foot 4, blood pressure 120/70,  pulse 80, respiratory rate 20.  GENERAL:  The patient is a healthy white female in no acute distress.  HEENT:  Negative.  NECK:  Supple without thyromegaly.  There is no supraclavicular or inguinal  adenopathy.  ABDOMEN:  Soft, nontender.  No masses, organomegaly, ascites or hernias are  noted.  She has as well-healed midline no interval change.  PELVIC:  EG, BUS, vagina, bladder and urethra are normal but atrophic.  Cervix is atrophic.  Uterus is anterior, normal shape, size and consistency.  I do not feel any adnexal masses.   The patient's laboratory work and scans are reviewed.   IMPRESSION:  Simple bilateral ovarian cysts.  Whether the achy pain in the  left lower quadrant is associated with the left adnexal cyst is uncertain.  I do not think the patient has ovarian cancer based on all the  characteristics noted above and therefore would recommend she undergo  laparoscopic bilateral salpingo-oophorectomy under the care of Dr.  Rosalio Macadamia.  Certainly if she were to have the rare occurrence of an ovarian  cancer, completion of surgical staging with a second laparoscopy would be  very reasonable.   We also discussed the pros and cons of performing a hysterectomy and the  patient is inclined not to extend her surgical procedure any further than  necessary.   She will return to the care of Dr. Rosalio Macadamia and is scheduled to go ahead  with surgery next Wednesday.      De Blanch, M.D.  Electronically Signed     DC/MEDQ  D:  09/01/2005  T:  09/04/2005  Job:  [id number]   cc:   Sherry A. Rosalio Macadamia, M.D.  Fax: [phone]   Telford Nab, R.N.  [address]  [place], [place] [address]

## 2010-10-21 NOTE — H&P (Signed)
[name], [name]             ACCOUNT NO.:  [id number]   MEDICAL RECORD NO.:  [id number]          PATIENT TYPE:  INP   LOCATION:  1618                         FACILITY:  [facility]   PHYSICIAN:  Hollice Espy, M.D.DATE OF BIRTH:  06/22/1932   DATE OF ADMISSION:  11/13/2004  DATE OF DISCHARGE:                                HISTORY & PHYSICAL   PRIMARY CARE PHYSICIAN:  [name], M.D.   CHIEF COMPLAINT:  Abdominal pain, diarrhea, and rectal bleeding.   HISTORY OF PRESENT ILLNESS:  The patient is a 77 year old white female with  past medical history only of DJD and cervical arthritis, who for the past  six months to a year has had episodes of intermittent abdominal pain.  Workup has always been negative, including CT scans.  The patient has also  undergone a flexible sigmoidoscopy in the past with annual Hemoccult which  have been always normal.  For the past few days she has had some worsening  abdominal pain, mostly located in the left lower quadrant.  No associated  nausea or vomiting or abdominal pain elsewhere.  She has been able to  tolerate p.o. and doing well, and then today she started having problems  with diarrhea this morning followed by noticeable rectal bleeding.  She  became quite concerned and came into the emergency room.  She tells me that  normally her bowels move on a regular basis without issue.  Currently she  still complains of some abdominal discomfort, despite being given doses of  morphine.  She denies any other symptoms.  She also has noted in the last  few weeks she has been complaining of intermittent, vague complaints of  pain, including left shoulder pain, neck pain, headache, scalp pain,  bilateral hip pain, and numbness in her left foot, and she has had problems  with bilateral carpal tunnel.  She tells me this has all been going on  within the last month or so.   REVIEW OF SYSTEMS:  Review of systems for now is otherwise negative.  She  has no chest pain or shortness of breath.  No hematuria, dysuria.  No  constipation.  No vision changes or dysphagia.   PAST MEDICAL HISTORY:  1.  Includes intermittent episodes of abdominal pain.  2.  Cervical arthritis.  3.  DJD of the lower spine.   MEDICATIONS:  She has been on Relafen p.r.n., but otherwise is not on any  medications.   PAST SURGICAL HISTORY:  She has had three C-sections.   SOCIAL HISTORY:  She denies tobacco or drug use.  She drinks alcohol on  social occasions.   FAMILY HISTORY:  Noted for an uncle with Crohn's disease.   ALLERGIES:  1.  PENICILLIN.  2.  SULFA.   PHYSICAL EXAMINATION:  VITAL SIGNS:  Temperature 98.2, blood pressure  142/74, respirations 18, heart rate initially 84, down to 62, O2 saturation  96% on room air.  GENERAL:  The patient is alert and oriented x3, in minimal distress  secondary to her abdominal pain.  HEENT:  Normocephalic, atraumatic.  Mucous membranes are slightly dry.  NECK:  She has no carotid bruits.  HEART:  Regular rate and rhythm, S1/S2.  LUNGS:  Clear to auscultation bilaterally.  ABDOMEN:  Soft with some tenderness, especially in the right lower quadrant  with deep palpation.  Scant bowel sounds.  EXTREMITIES:  Show no clubbing or cyanosis.  Trace pitting edema.   LABORATORY DATA:  White count is 7.9, H&H 14 and 41.6, MCV 91, platelet  count 220, 83% neutrophils.  Sodium is 140, potassium 4.1, chloride 108,  bicarb 25, BUN 16, creatinine 0.9, glucose 91.  LFTs are only remarkable for  an albumin of 3.3 and total protein of 5.9.  The rest of her LFTs are  unremarkable.   ASSESSMENT/PLAN:  1.  __________.  May be inflammatory versus infectious.  I less likely think      ischemic, as she has no evidence of any anion gap.  Her white count is      normal with only a mild shift.  Will send off stool for culture.  Have      put her on IV antibiotics and have spoken with Dr. Luther Parody of [facility], who will follow  her and perhaps we will get a colonoscopy.  2.  Generalized complaints of joint pain.  I question whether the patient      may have a systemic rheumatoid condition.  She seems to have signs of      polymyalgia rheumatica and this colitis may be even an inflammatory      component of that.  Checking a sedimentation rate, especially now, may      be nonspecific, but will still check regardless to see if it is markedly      elevated.  If so, once this colitis is treated, perhaps a course of      prednisone may improve her generalized symptoms.       SKK/MEDQ  D:  11/13/2004  T:  11/13/2004  Job:  [id number]   cc:   Althea Grimmer. Luther Parody, M.D.  [address]  [place]  [place] [address]  Fax: [phone]   [name], M.D.  [address] [place]  [place] [address]  Fax: [phone]

## 2010-10-27 ENCOUNTER — Telehealth: Payer: Self-pay | Admitting: *Deleted

## 2010-10-27 NOTE — Telephone Encounter (Signed)
caled to get pcp name

## 2010-12-21 ENCOUNTER — Other Ambulatory Visit: Payer: Self-pay | Admitting: Family Medicine

## 2010-12-21 ENCOUNTER — Ambulatory Visit
Admission: RE | Admit: 2010-12-21 | Discharge: 2010-12-21 | Disposition: A | Discharge status: Home / Self Care | Payer: Medicare Other | Source: Ambulatory Visit | Attending: Family Medicine | Admitting: Family Medicine

## 2010-12-21 DIAGNOSIS — R1032 Left lower quadrant pain: Secondary | ICD-10-CM

## 2010-12-21 DIAGNOSIS — R1012 Left upper quadrant pain: Secondary | ICD-10-CM

## 2010-12-21 MED ORDER — IOHEXOL 300 MG/ML  SOLN: 100.0000 mL | Freq: Once | INTRAMUSCULAR | Status: AC | PRN

## 2011-01-08 ENCOUNTER — Observation Stay (HOSPITAL_COMMUNITY)
Admission: EM | Admit: 2011-01-08 | Discharge: 2011-01-10 | Disposition: A | Discharge status: Home / Self Care | Payer: Medicare Other | Attending: Internal Medicine | Admitting: Internal Medicine

## 2011-01-08 ENCOUNTER — Emergency Department (HOSPITAL_COMMUNITY): Payer: Medicare Other

## 2011-01-08 DIAGNOSIS — Z9089 Acquired absence of other organs: Secondary | ICD-10-CM | POA: Insufficient documentation

## 2011-01-08 DIAGNOSIS — G8929 Other chronic pain: Secondary | ICD-10-CM | POA: Insufficient documentation

## 2011-01-08 DIAGNOSIS — R1032 Left lower quadrant pain: Secondary | ICD-10-CM | POA: Insufficient documentation

## 2011-01-08 DIAGNOSIS — R209 Unspecified disturbances of skin sensation: Principal | ICD-10-CM | POA: Insufficient documentation

## 2011-01-08 DIAGNOSIS — R1310 Dysphagia, unspecified: Secondary | ICD-10-CM | POA: Insufficient documentation

## 2011-01-08 DIAGNOSIS — Z9079 Acquired absence of other genital organ(s): Secondary | ICD-10-CM | POA: Insufficient documentation

## 2011-01-08 DIAGNOSIS — R930 Abnormal findings on diagnostic imaging of skull and head, not elsewhere classified: Secondary | ICD-10-CM | POA: Insufficient documentation

## 2011-01-08 DIAGNOSIS — Z7982 Long term (current) use of aspirin: Secondary | ICD-10-CM | POA: Insufficient documentation

## 2011-01-08 DIAGNOSIS — R51 Headache: Secondary | ICD-10-CM | POA: Insufficient documentation

## 2011-01-08 LAB — DIFFERENTIAL
Eosinophils Relative: 2 % (ref 0–5)
Lymphocytes Relative: 23 % (ref 12–46)
Lymphs Abs: 1.6 10*3/uL (ref 0.7–4.0)
Monocytes Relative: 6 % (ref 3–12)
Neutrophils Relative %: 68 % (ref 43–77)

## 2011-01-08 LAB — COMPREHENSIVE METABOLIC PANEL
ALT: 15 U/L (ref 0–35)
AST: 21 U/L (ref 0–37)
Alkaline Phosphatase: 59 U/L (ref 39–117)
CO2: 26 mEq/L (ref 19–32)
Chloride: 101 mEq/L (ref 96–112)
GFR calc non Af Amer: >60 mL/min (ref >60)
Potassium: 3.8 mEq/L (ref 3.5–5.1)
Sodium: 136 mEq/L (ref 135–145)
Total Bilirubin: 0.4 mg/dL (ref 0.3–1.2)

## 2011-01-08 LAB — CBC
HCT: 40.5 % (ref 36.0–46.0)
MCH: 31 pg (ref 26.0–34.0)
MCV: 89.8 fL (ref 78.0–100.0)
RBC: 4.51 MIL/uL (ref 3.87–5.11)
WBC: 6.8 10*3/uL (ref 4.0–10.5)

## 2011-01-08 LAB — CK TOTAL AND CKMB (NOT AT ARMC): Relative Index: INVALID (ref 0.0–2.5)

## 2011-01-08 LAB — BASIC METABOLIC PANEL
CO2: 20 mEq/L (ref 19–32)
Chloride: 105 mEq/L (ref 96–112)
GFR calc Af Amer: >60 mL/min (ref >60)
Potassium: 4.7 mEq/L (ref 3.5–5.1)

## 2011-01-08 LAB — TROPONIN I: Troponin I: <0.3 ng/mL (ref <0.30)

## 2011-01-08 LAB — PROTIME-INR
INR: 1 (ref 0.00–1.49)
Prothrombin Time: 13.4 seconds (ref 11.6–15.2)

## 2011-01-09 ENCOUNTER — Observation Stay (HOSPITAL_COMMUNITY): Payer: Medicare Other

## 2011-01-09 DIAGNOSIS — G459 Transient cerebral ischemic attack, unspecified: Secondary | ICD-10-CM

## 2011-01-09 DIAGNOSIS — I359 Nonrheumatic aortic valve disorder, unspecified: Secondary | ICD-10-CM

## 2011-01-09 LAB — HEMOGLOBIN A1C: Hgb A1c MFr Bld: 5.5 % (ref <5.7)

## 2011-01-09 LAB — URINALYSIS, ROUTINE W REFLEX MICROSCOPIC
Bilirubin Urine: NEGATIVE
Glucose, UA: NEGATIVE mg/dL
Ketones, ur: NEGATIVE mg/dL
Specific Gravity, Urine: 1.009 (ref 1.005–1.030)
pH: 6 (ref 5.0–8.0)

## 2011-01-09 LAB — LIPID PANEL
Cholesterol: 166 mg/dL (ref 0–200)
Triglycerides: 69 mg/dL (ref <150)
VLDL: 14 mg/dL (ref 0–40)

## 2011-01-09 LAB — URINE MICROSCOPIC-ADD ON

## 2011-01-09 LAB — C-REACTIVE PROTEIN: CRP: 0.4 mg/dL — ABNORMAL LOW (ref <0.6)

## 2011-01-09 NOTE — H&P (Signed)
[name], [name] NO.:  [id number]  MEDICAL RECORD NO.:  [id number]  LOCATION:  WLED                         FACILITY:  [facility]  PHYSICIAN:  Tana Felts, MD     DATE OF BIRTH:  [birth year]  DATE OF ADMISSION:  01/08/2011 DATE OF DISCHARGE:                             HISTORY & PHYSICAL   CHIEF COMPLAINT:  Left-sided facial numbness.  HISTORY OF PRESENT ILLNESS:  This is a 77 year old woman with a past medical history of unusual abdominal pain, otherwise healthy who presents with facial numbness since about 10 a.m. this morning, so about 9 hours ago.  She notes that she has some chronic left lower quadrant abdominal pain, which over the last 3 weeks has been worse, shooting in nature, and she had been seeing her doctor because of that.  She started Cipro couple days ago for some abdominal pain, possibly a UTI, although she does not know the results of her urinalysis.  This morning, she awoke, took her medications and around 10 a.m., had sudden onset left- sided headache starting in her neck causing some orbital pain as well as some numbness and tingling initially on her entire left side, but mainly now in her left face and mouth.  She also has had a little bit of trouble swallowing starting around the same time.  She denies any weakness in her face otherwise.  She took an allergy medication, such as Benadryl.  I am thinking it might be a side effect to the Cipro, but this did not improve her symptoms.  So, she came to the emergency room and in the ED, an MRI was done which showed no evidence of an acute infarct.  So, she was seen and admitted for workup for TIA.  REVIEW OF SYSTEMS:  Ten system review otherwise negative.  PAST MEDICAL HISTORY:  Cervical disk problems and chronic abdominal pain.  SURGICAL HISTORY:  Appendectomy, bilateral oophorectomy, removal of a cyst from her kidney, cesarean section.  SOCIAL HISTORY:  The patient does not smoke.   She is an occasional social drinker.  No recent travel, though she has been to cribbing in [place] in the past.  She was at [facility] on July 4th, but says she otherwise has not been outside very much and does not remember any mosquito bites.  FAMILY HISTORY:  Mother has atrial fibrillation and is currently 82 years old.  Father had ulcers.  She has a daughter who died of CIDP.  MEDICATIONS:  Aspirin 81 mg daily, variety of vitamins although she has not taken any for the last few weeks.  She took 1 dose of Vicodin yesterday with no help for her abdominal pain.  PHYSICAL EXAMINATION:  VITAL SIGNS:  Temperature 98.4, pulse 67, blood pressure 143/61, respiratory rate 18, satting 97% on room air. GENERAL:  She is a well-nourished, well-appearing elderly woman in no acute distress, though she is slightly anxious. HEENT:  Pupils equal, round, reactive to light.  Extraocular movements intact.  No nystagmus.  There is no scleral icterus or conjunctivitis. She has moist mucous membranes.  Oropharynx is clear.  No thrush. NECK: Supple. LUNGS: Clear to auscultation bilaterally. CARDIAC:  Regular rate  and rhythm.  No murmurs, rubs, or gallops. ABDOMEN: Soft and tender all along the left side with voluntary guarding.  Normal bowel sounds.  No masses appreciated. EXTREMITIES:  Warm, well perfused.  No cyanosis, clubbing, or edema. NEUROLOGICAL:  She has 5/5 strength in all extremities and intact sensation in her extremities.  Cranial nerves are symmetric and intact with the exception of decreased sensation on the left V1-V2 distributions.  She does report some dysphagia, though palate and tongue move normally with no deviation or asymmetry.  LABORATORY DATA:  Chemistries were within normal limits with a creatinine of 0.87, troponin was negative.  White count 6.8, hemoglobin 14, platelets 205. MRI, no acute infarct, moderate to marked small vessel disease, and minimal-to-mild paranasal  mucosal thickening.  The right vertebral artery was not well delineated.  ASSESSMENT:  This is a 77 year old woman who presents with facial numbness and dysphasia concerning for acute stroke.  MRI was reassuring, but I think it is reasonable to admit her for observation and complete stroke workup.  PLAN: 1. She will be admitted to a telemetry bed. 2. She will get complete imaging.  She will need an MRA, EKG, echo,     and carotid Dopplers. 3. She will start on a daily aspirin and a statin if her     cholesterol warrants it.  I also continue her multivitamin. 4. We will check the usual stroke labs including TSH, ESR, CRP,     hemoglobin A1c, cardiac enzymes, lipid panel. 5. I did not an antihypertensive at this time.  Her blood pressure has     improved somewhat and if needed she can be started on one in after her     acute symptoms resolve. 6. Abdominal pain: I am not sure what the etiology of this is.  I suspect     some sort of neurological or functional pain.  She has been getting     worked up for this as an outpatient and there are no any acute     issues to address.     Tana Felts, MD     NB/MEDQ  D:  01/08/2011  T:  01/08/2011  Job:  [id number]  Electronically Signed by Tana Felts M.D. on 01/09/2011 10:21:00 AM

## 2011-01-16 ENCOUNTER — Other Ambulatory Visit: Payer: Self-pay | Admitting: Neurosurgery

## 2011-01-16 DIAGNOSIS — M47812 Spondylosis without myelopathy or radiculopathy, cervical region: Secondary | ICD-10-CM

## 2011-01-18 ENCOUNTER — Ambulatory Visit
Admission: RE | Admit: 2011-01-18 | Discharge: 2011-01-18 | Disposition: A | Discharge status: Home / Self Care | Payer: Medicare Other | Source: Ambulatory Visit | Attending: Neurosurgery | Admitting: Neurosurgery

## 2011-01-18 DIAGNOSIS — M47812 Spondylosis without myelopathy or radiculopathy, cervical region: Secondary | ICD-10-CM

## 2011-01-19 NOTE — Discharge Summary (Signed)
[name], [name] NO.:  [id number]  MEDICAL RECORD NO.:  [id number]  LOCATION:  1433                         FACILITY:  [facility]  PHYSICIAN:  [name], M.D.DATE OF BIRTH:  1933/01/17  DATE OF ADMISSION:  01/08/2011 DATE OF DISCHARGE:  01/10/2011                              DISCHARGE SUMMARY   PRIMARY CARE PHYSICIAN:  [name], M.D.  NEUROLOGIST:  Over at [facility].  DISCHARGE DIAGNOSIS:  Facial numbness and arm tingling for the past 2 weeks.  DISCHARGE MEDICATIONS: 1. Aspirin 81 mg daily. 2. Calcium carbonate 1 tablet daily. 3. Vicodin 5/500 one tablet q.6 h. 4. Vitamin p.o. daily.  PROCEDURES PERFORMED:  Carotid Dopplers:  Generalized ICA stenosis, antegrade flow of the carotids.  Two-dimensional echo that showed an EF of 65%, no wall motion abnormalities.  MRA of the head that showed negative intracranial hemorrhage.  MRI of the head that showed no acute infarct, moderate to marked small vessel disease, minimal to mild parasinus mucosal thickening.  BRIEF ADMITTING HISTORY AND PHYSICAL:  This is a 77 year old female with past medical history of unusual abdominal pain, otherwise healthy, presents with facial numbness at about 10:00 a.m. this morning.  Nine hours ago, she noted that she has some chronic left lower quadrant abdominal pain, which over the last 3 weeks have been worsening and shooting in nature.  She is also taking Cipro for a couple of days for the abdominal pain with possible UTI.  She does not know the results of the UA.  This morning, she took her medication around 10:00 a.m.  Sudden onset left facial headache, starting in her neck, causing some orbital pain as well as some numbness and tingling initially to her left side, but mainly on the left face, mouth, and arm.  She also has a little bit of trouble swallowing starting around the same time.  She denies any weakness in her face, arms, or legs.  Please refer to  dictation from January 08, 2011 for further details.  LABS ON ADMISSION:  Shows white count of 6.8, hemoglobin of 14, platelet count 205, ANC of 4.7.  Her first set of cardiac enzymes were negative x1.  Her sodium is 135, potassium 4.7, chloride 105, bicarb of 20, glucose of 83, BUN of 14, creatinine of 0.8, calcium of 9.3.  Her CK-MBs continued to be negative.  PT is 13, INR 1.0.  Her LFTs are within normal limits.  HOSPITAL COURSE: 1. Facial numbness and tingling and also hand numbness.  She was     admitted and was monitored on telemetry with no events.  EKG shows     a normal sinus rhythm with no T-wave inversions. She relates she has had     this in the past, and she has had it in the past for as long as a     week with no improvement.  Her swallowing evaluation was done,     which was negative.  MRI, carotid Dopplers, and 2-D echo were done;     which were negative.  She gets headache with these tinglings.  So     this most likely be a type of migraine.  This resolved  while in the     hospital with no further treatment.  She was discharged in stable     condition.  She was continued on aspirin and no changes were made.     Her fasting lipid panel was checked in the hospital that was great.     Her LDL was 97, her HDL was 55.  She has a neurologist whom she     will follow up as an outpatient.  On the day of discharge;     temperature is 97, pulse of 68, respirations 18, blood pressure     149/74.  She was saturating 97% on room air.  DISCHARGE LABS:  Shows UA with 3 to 6 white blood cells, no bacteria. Her ESR is 10.  Her TSH is 2.1.  Her hemoglobin A1c is 5.5.  Her C- reactive protein is actually low, 0.4.  Fasting lipid panel shows total cholesterol of 166, her HDL 55, LDL 97, and triglycerides 69.  DISPOSITION:  The patient will follow up with her neurologist over at [facility] in 2 weeks.  We will assess how her headaches and tingling are doing, and will start her on medication at  that time if needed.     [name], M.D.     AF/MEDQ  D:  01/10/2011  T:  01/11/2011  Job:  [id number]  cc:   [name], M.D. Fax: [phone]  Electronically Signed by [name] M.D. on 01/19/2011 11:10:25 AM

## 2011-01-27 ENCOUNTER — Other Ambulatory Visit: Payer: Self-pay | Admitting: Neurosurgery

## 2011-01-27 DIAGNOSIS — M47812 Spondylosis without myelopathy or radiculopathy, cervical region: Secondary | ICD-10-CM

## 2011-01-30 ENCOUNTER — Ambulatory Visit
Admission: RE | Admit: 2011-01-30 | Discharge: 2011-01-30 | Disposition: A | Discharge status: Home / Self Care | Payer: Medicare Other | Source: Ambulatory Visit | Attending: Neurosurgery | Admitting: Neurosurgery

## 2011-01-30 DIAGNOSIS — M47812 Spondylosis without myelopathy or radiculopathy, cervical region: Secondary | ICD-10-CM

## 2011-01-30 MED ORDER — IOHEXOL 300 MG/ML  SOLN
1.0000 mL | Freq: Once | INTRAMUSCULAR | Status: AC | PRN
  Administered 2011-01-30: 1 mL via EPIDURAL

## 2011-01-30 MED ORDER — TRIAMCINOLONE ACETONIDE 40 MG/ML IJ SUSP (RADIOLOGY)
60.0000 mg | Freq: Once | INTRAMUSCULAR | Status: AC
  Administered 2011-01-30: 60 mg via EPIDURAL

## 2011-03-03 LAB — CBC
HCT: 42.6
Hemoglobin: 14.5
MCHC: 34
MCV: 92.4
RBC: 4.61
RDW: 12.9

## 2011-03-03 LAB — PROTIME-INR: INR: 0.9

## 2011-05-08 ENCOUNTER — Other Ambulatory Visit: Payer: Self-pay | Admitting: Dermatology

## 2011-05-08 DIAGNOSIS — C4492 Squamous cell carcinoma of skin, unspecified: Secondary | ICD-10-CM

## 2011-05-08 HISTORY — DX: Squamous cell carcinoma of skin, unspecified: C44.92

## 2011-06-09 DIAGNOSIS — M4712 Other spondylosis with myelopathy, cervical region: Secondary | ICD-10-CM | POA: Diagnosis not present

## 2011-06-09 DIAGNOSIS — G43809 Other migraine, not intractable, without status migrainosus: Secondary | ICD-10-CM | POA: Diagnosis not present

## 2011-10-04 ENCOUNTER — Ambulatory Visit (INDEPENDENT_AMBULATORY_CARE_PROVIDER_SITE_OTHER): Payer: Medicare Other | Admitting: Cardiovascular Disease

## 2011-10-04 ENCOUNTER — Encounter: Payer: Self-pay | Admitting: Cardiovascular Disease

## 2011-10-04 VITALS — BP 130/84 | HR 68 | Ht 64.0 in | Wt 132.1 lb

## 2011-10-04 DIAGNOSIS — R002 Palpitations: Secondary | ICD-10-CM

## 2011-10-04 NOTE — Patient Instructions (Signed)
Your physician wants you to follow-up in: 1 YEAR  You will receive a reminder letter in the mail two months in advance. If you don't receive a letter, please call our office to schedule the follow-up appointment.  Your physician recommends that you return for a FASTING lipid profile: 1 YEAR OR BRING COPY OF LAB WORK, THANK YOU

## 2011-10-04 NOTE — Assessment & Plan Note (Signed)
[name] is doing well.  No significant palpitations. We'll continue with her same medications.  I'll see her again in one year.

## 2011-10-04 NOTE — Progress Notes (Signed)
    [name]  Date of Birth  1933/04/17 [facility] [address]     [address] [place], [place]  [address] [phone]  Fax  [phone]   P roblem list: 1. Palpitations 2. History of syncope 3. Dizziness 4. Mild hypertension  History of Present Illness:  [name] is an elderly female with a history of palpitations and history of dizziness. She has mild hypertension. She's done very well since I last saw her. She has not had any episodes of chest pain or shortness of breath. She has had some leg discomfort particularly on busy days.  Current Outpatient Prescriptions on File Prior to Visit  Medication Sig Dispense Refill  . aspirin 81 MG tablet Take 81 mg by mouth daily.        Marland Kitchen CALCIUM PO Take 1,500 mg by mouth daily.        . Cholecalciferol (VITAMIN D) 2000 UNITS CAPS Take by mouth daily.        . Multiple Vitamin (MULTIVITAMIN) tablet Take 1 tablet by mouth daily.          Allergies  Allergen Reactions  . Penicillins Rash  . Sulfonamide Derivatives Swelling  . Ciprofloxacin Other (See Comments)    triggered a migraine that was thought to be a TIA    Past Medical History  Diagnosis Date  . Palpitations   . Syncope     History of   . Dizziness   . Hypertension     mild  . Back pain   . Depression   . Stress   . Fatigue   . Arm numbness left   . Skin cancer   . Bronchitis, chronic     Past Surgical History  Procedure Date  . Appendectomy     History  Smoking status  . Never Smoker   Smokeless tobacco  . Never Used    History  Alcohol Use  . Yes    social ETOH    Family History  Problem Relation Age of Onset  . Atrial fibrillation Mother   . Alcohol abuse Father     Reviw of Systems:  Reviewed in the HPI.  All other systems are negative.  Physical Exam: BP 130/84  Pulse 68  Ht 5\' 4"  (1.626 m)  Wt 132 lb 1.9 oz (59.929 kg)  BMI 22.68 kg/m2 The patient is alert and oriented x 3.  The mood  and affect are normal.  The skin is warm and dry.  Color is normal.  The HEENT exam reveals that the sclera are nonicteric.  The mucous membranes are moist.  The carotids are 2+ without bruits.  There is no thyromegaly.  There is no JVD.  The lungs are clear.  The chest wall is non tender.  The heart exam reveals a regular rate with a normal S1 and S2.  There are no murmurs, gallops, or rubs.  The PMI is not displaced.   Abdominal exam reveals good bowel sounds.  There is no guarding or rebound.  There is no hepatosplenomegaly or tenderness.  There are no masses.  Exam of the legs reveal no clubbing, cyanosis, or edema.  The legs are without rashes.  The distal pulses are intact.  Cranial nerves II - XII are intact.  Motor and sensory functions are intact.  The gait is normal.  Assessment / Plan:

## 2011-10-11 DIAGNOSIS — B351 Tinea unguium: Secondary | ICD-10-CM | POA: Diagnosis not present

## 2011-11-22 DIAGNOSIS — M4712 Other spondylosis with myelopathy, cervical region: Secondary | ICD-10-CM | POA: Diagnosis not present

## 2012-01-03 DIAGNOSIS — Q619 Cystic kidney disease, unspecified: Secondary | ICD-10-CM | POA: Diagnosis not present

## 2012-01-03 DIAGNOSIS — R059 Cough, unspecified: Secondary | ICD-10-CM | POA: Diagnosis not present

## 2012-01-03 DIAGNOSIS — R05 Cough: Secondary | ICD-10-CM | POA: Diagnosis not present

## 2012-01-03 DIAGNOSIS — J3489 Other specified disorders of nose and nasal sinuses: Secondary | ICD-10-CM | POA: Diagnosis not present

## 2012-01-29 ENCOUNTER — Telehealth: Payer: Self-pay | Admitting: Internal Medicine

## 2012-01-29 NOTE — Telephone Encounter (Signed)
Spoke with patient-states she is having continuous cough-white and thick; no acute symptoms and just wants next open appt with MW to have yearly ROV. Appt made for 02-26-12 at 1130am and patient aware to call office if cough gets worse or starts to have any color in phlegm. Pt verbalized her understanding of this.

## 2012-02-26 ENCOUNTER — Ambulatory Visit: Payer: Medicare Other | Admitting: Internal Medicine

## 2012-02-28 DIAGNOSIS — J387 Other diseases of larynx: Secondary | ICD-10-CM | POA: Diagnosis not present

## 2012-02-28 DIAGNOSIS — H9110 Presbycusis, unspecified ear: Secondary | ICD-10-CM | POA: Diagnosis not present

## 2012-02-28 DIAGNOSIS — J310 Chronic rhinitis: Secondary | ICD-10-CM | POA: Diagnosis not present

## 2012-03-06 DIAGNOSIS — M4712 Other spondylosis with myelopathy, cervical region: Secondary | ICD-10-CM | POA: Diagnosis not present

## 2012-03-06 DIAGNOSIS — G43809 Other migraine, not intractable, without status migrainosus: Secondary | ICD-10-CM | POA: Diagnosis not present

## 2012-03-07 ENCOUNTER — Other Ambulatory Visit: Payer: Self-pay | Admitting: Neurosurgery

## 2012-03-07 DIAGNOSIS — M4712 Other spondylosis with myelopathy, cervical region: Secondary | ICD-10-CM

## 2012-03-08 ENCOUNTER — Ambulatory Visit (INDEPENDENT_AMBULATORY_CARE_PROVIDER_SITE_OTHER)
Admission: RE | Admit: 2012-03-08 | Discharge: 2012-03-08 | Disposition: A | Discharge status: Home / Self Care | Payer: Medicare Other | Source: Ambulatory Visit | Attending: Internal Medicine | Admitting: Internal Medicine

## 2012-03-08 ENCOUNTER — Encounter: Payer: Self-pay | Admitting: Internal Medicine

## 2012-03-08 ENCOUNTER — Ambulatory Visit (INDEPENDENT_AMBULATORY_CARE_PROVIDER_SITE_OTHER): Payer: Medicare Other | Admitting: Internal Medicine

## 2012-03-08 VITALS — BP 108/80 | HR 79 | Temp 98.6°F | Ht 64.0 in | Wt 136.0 lb

## 2012-03-08 DIAGNOSIS — R05 Cough: Secondary | ICD-10-CM

## 2012-03-08 DIAGNOSIS — J479 Bronchiectasis, uncomplicated: Secondary | ICD-10-CM | POA: Diagnosis not present

## 2012-03-08 DIAGNOSIS — R059 Cough, unspecified: Secondary | ICD-10-CM

## 2012-03-08 DIAGNOSIS — Z23 Encounter for immunization: Secondary | ICD-10-CM | POA: Diagnosis not present

## 2012-03-08 DIAGNOSIS — J42 Unspecified chronic bronchitis: Secondary | ICD-10-CM | POA: Diagnosis not present

## 2012-03-08 NOTE — Assessment & Plan Note (Signed)
-   Sinus CT August 18, 2010 >>  neg   This cough is more typical of  Classic Upper airway cough syndrome, so named because it's frequently impossible to sort out how much is  CR/sinusitis with freq throat clearing (which can be related to primary GERD)   vs  causing  secondary (" extra esophageal")  GERD from wide swings in gastric pressure that occur with throat clearing, often  promoting self use of mint and menthol lozenges that reduce the lower esophageal sphincter tone and exacerbate the problem further in a cyclical fashion.   These are the same pts (now being labeled as having "irritable larynx syndrome" by some cough centers) who not infrequently have a history of having failed to tolerate ace inhibitors,  dry powder inhalers or biphosphonates or report having atypical reflux symptoms that don't respond to standard doses of PPI , and are easily confused as having aecopd or asthma flares by even experienced allergists/ pulmonologists.   Try qhs h1 and h2 and prn mucinex dm   Each maintenance medication was reviewed in detail including most importantly the difference between maintenance(to "keep the horse in the barn"  and as needed (to chase the horse back if gets out) and under what circumstances the prns are to be used.  Please see instructions for details which were reviewed in writing and the patient given a copy.

## 2012-03-08 NOTE — Assessment & Plan Note (Addendum)
No evidence of clinical or radiographic progression so no changes needed  rec f/u pft's next ov

## 2012-03-08 NOTE — Progress Notes (Signed)
Subjective:    Patient ID: [name], female    DOB: 1933/01/13 .   MRN: [id number]  HPI Summary: Pulmonary/ ext f/u ov for bronchiectasis  Primary Provider/Referring Provider: Izola Price    14  yowf never smoker with recurrent cough since age 78 with right middle lobe/lingular syndrome with limited bronchiectatic changes on her most recent CT study dated 09/2005   April 28, 2009 Acute visit. Pt c/o dry cough worse since September occ yellow.  rec: Levaquin 750 x 5 days  Prednisone 4 each am x 2days, 2x2days, 1x2days and stop  Vicodin 5 one every 4 hours for excess cough or pain  Pneumonia @ ov = last one unless the CDC guidelines change  Flu shot today.   August 18, 2010 ov for bronchiectasis / chronic cough worse x 6 months and LUQ pain worse when lie down present 24 hours per day x year already neg abd CT Scan > mucus is clear assoc with nasal drainage also clear, worse in ams with mild nasal congestion. Sinus ct neg so REC 1) Chlortrimeton 4 mg one at bedtime  2) Tagamet 400mg  at bedtime    09/29/2010 ov [name]/ cc cough much better,  No sob. rec GERD   Return yearly for cxr  - if cough worsens use mucinex dm    03/08/2012 f/u ov/[name] no longer on h1 and h2 and cough worse indolent /persistent, nightly since sev months esp at hs.  Mucus is white, no sob.   Also denies any obvious fluctuation of symptoms with weather or environmental changes or other aggravating or alleviating factors.    ROS  The following are not active complaints unless bolded sore throat, dysphagia, dental problems, itching, sneezing,  nasal congestion or excess/ purulent secretions, ear ache,   fever, chills, sweats, unintended wt loss, pleuritic or exertional cp, hemoptysis,  orthopnea pnd or leg swelling, presyncope, palpitations, heartburn, abdominal pain, anorexia, nausea, vomiting, diarrhea  or change in bowel or urinary habits, change in stools or urine, dysuria,hematuria,  rash, arthralgias, visual  complaints, headache, numbness weakness or ataxia or problems with walking or coordination,  change in mood/affect or memory.       Past Medical History:  BRONCHIECTASIS (ICD-494.0) see CT scan of the chest dated 09/29/2005  - Pneumovax April 28, 2009 (over 65 so last shot)  - Alpha one Screen sent August 18, 2010 >>>  Neg - Sinus CT August 18, 2010 >>  neg  COUGH, CHRONIC (ICD-786.2)  Left Kidney Cyst  - 04/21/09 [facility] PROCEDURE(S): ULTRASOUND GUIDED AND FLUOROSCOPIC GUIDED LEFT RENAL CYST ASPIRATION AND SCLEROSIS  LUQ Abd pain 2011...............................................................Marland KitchenSchooler  - CT ABD 07/29/10 no etiology                   Objective:   Physical Exam    thin pleasant ambulatory white female in no acute distress.  wt 137 > 137 April 28, 2009 > August 18, 2010 137 >  137 09/29/2010 > 03/08/2012  HEENT mild turbinate edema. Oropharynx no thrush or excess pnd or cobblestoning. No JVD or cervical adenopathy. Mild accessory muscle hypertrophy. Trachea midline, nl thryroid. Chest was hyperinflated by percussion with diminished breath sounds and moderate increased exp time without wheeze. Hoover sign positive at mid inspiration. Regular rate and rhythm without murmur gallop or rub or increase P2. no edema Abd: no hsm, nl excursion. Ext warm without cyanosis or clubbing.   CXR  03/08/2012 :  Stable chronic lung disease. No superimposed acute findings are identified.  Assessment & Plan:

## 2012-03-08 NOTE — Patient Instructions (Signed)
Plan A Chlortrimeton 4 mg and Tagamet 400 mg one at bedtime  Plan B If cough use mucinex dm total 1200 every 12 hours and add prilosec 20 mg Take 30-60 min before first meal of the day  GERD (REFLUX)  is an extremely common cause of respiratory symptoms like a cough, many times with no significant heartburn at all.    It can be treated with medication, but also with lifestyle changes including avoidance of late meals, excessive alcohol, smoking cessation, and avoid fatty foods, chocolate, peppermint, colas, red wine, and acidic juices such as orange juice.  NO MINT OR MENTHOL PRODUCTS SO NO COUGH DROPS  USE SUGARLESS CANDY INSTEAD (jolley ranchers or Stover's)  NO OIL BASED VITAMINS - use powdered substitutes.  Please remember to go to the  x-ray department downstairs for your tests - we will call you with the results when they are available.  Please schedule a follow up office visit in 6 weeks, call sooner if needed pft's on return

## 2012-03-08 NOTE — Progress Notes (Signed)
Quick Note:  Spoke with pt and notified of results per Dr. Wert. Pt verbalized understanding and denied any questions.  ______ 

## 2012-03-11 ENCOUNTER — Ambulatory Visit
Admission: RE | Admit: 2012-03-11 | Discharge: 2012-03-11 | Disposition: A | Discharge status: Home / Self Care | Payer: Medicare Other | Source: Ambulatory Visit | Attending: Neurosurgery | Admitting: Neurosurgery

## 2012-03-11 ENCOUNTER — Telehealth: Payer: Self-pay | Admitting: Internal Medicine

## 2012-03-11 VITALS — BP 170/76 | HR 66

## 2012-03-11 DIAGNOSIS — M4712 Other spondylosis with myelopathy, cervical region: Secondary | ICD-10-CM

## 2012-03-11 DIAGNOSIS — M47812 Spondylosis without myelopathy or radiculopathy, cervical region: Secondary | ICD-10-CM | POA: Diagnosis not present

## 2012-03-11 MED ORDER — IOHEXOL 300 MG/ML  SOLN
1.0000 mL | Freq: Once | INTRAMUSCULAR | Status: AC | PRN
  Administered 2012-03-11: 1 mL via EPIDURAL

## 2012-03-11 MED ORDER — TRIAMCINOLONE ACETONIDE 40 MG/ML IJ SUSP (RADIOLOGY)
60.0000 mg | Freq: Once | INTRAMUSCULAR | Status: AC
  Administered 2012-03-11: 60 mg via EPIDURAL

## 2012-03-11 NOTE — Telephone Encounter (Signed)
lmomtcb x1 for pt 

## 2012-03-11 NOTE — Telephone Encounter (Signed)
Pt reports that she has taken Chlortrimeton every night since ov on10-4-13 and has had nausea with stomach upset since then and is not having bladdrer urgency but unable to urinate.  Pt thinks she has had a similar reaction to this in the past.  She said that she has taken Claritin in the past without any problems.  Please advise.

## 2012-03-11 NOTE — Telephone Encounter (Signed)
Best alternative is zyrtec 10 mg at bedtime and if can't tolerat that then clariton is ok but not ideal in this setting Add chlorpheniramine to allergy list

## 2012-03-12 NOTE — Telephone Encounter (Signed)
Pt aware. Terisha Losasso, CMA  

## 2012-03-27 DIAGNOSIS — Z01419 Encounter for gynecological examination (general) (routine) without abnormal findings: Secondary | ICD-10-CM | POA: Diagnosis not present

## 2012-03-27 DIAGNOSIS — M949 Disorder of cartilage, unspecified: Secondary | ICD-10-CM | POA: Diagnosis not present

## 2012-03-27 DIAGNOSIS — N951 Menopausal and female climacteric states: Secondary | ICD-10-CM | POA: Diagnosis not present

## 2012-03-27 DIAGNOSIS — M899 Disorder of bone, unspecified: Secondary | ICD-10-CM | POA: Diagnosis not present

## 2012-03-27 DIAGNOSIS — Z124 Encounter for screening for malignant neoplasm of cervix: Secondary | ICD-10-CM | POA: Diagnosis not present

## 2012-03-28 DIAGNOSIS — Z1329 Encounter for screening for other suspected endocrine disorder: Secondary | ICD-10-CM | POA: Diagnosis not present

## 2012-03-28 DIAGNOSIS — Z130 Encounter for screening for diseases of the blood and blood-forming organs and certain disorders involving the immune mechanism: Secondary | ICD-10-CM | POA: Diagnosis not present

## 2012-03-28 DIAGNOSIS — Z13220 Encounter for screening for lipoid disorders: Secondary | ICD-10-CM | POA: Diagnosis not present

## 2012-03-28 DIAGNOSIS — Z0000 Encounter for general adult medical examination without abnormal findings: Secondary | ICD-10-CM | POA: Diagnosis not present

## 2012-04-19 ENCOUNTER — Ambulatory Visit: Payer: Medicare Other | Admitting: Internal Medicine

## 2012-04-24 DIAGNOSIS — M949 Disorder of cartilage, unspecified: Secondary | ICD-10-CM | POA: Diagnosis not present

## 2012-04-24 DIAGNOSIS — Z8262 Family history of osteoporosis: Secondary | ICD-10-CM | POA: Diagnosis not present

## 2012-04-24 DIAGNOSIS — M899 Disorder of bone, unspecified: Secondary | ICD-10-CM | POA: Diagnosis not present

## 2012-05-14 ENCOUNTER — Ambulatory Visit: Payer: Medicare Other | Admitting: Internal Medicine

## 2012-05-21 ENCOUNTER — Ambulatory Visit: Payer: Medicare Other | Admitting: Internal Medicine

## 2012-06-03 DIAGNOSIS — M810 Age-related osteoporosis without current pathological fracture: Secondary | ICD-10-CM | POA: Diagnosis not present

## 2012-06-18 DIAGNOSIS — G43809 Other migraine, not intractable, without status migrainosus: Secondary | ICD-10-CM | POA: Diagnosis not present

## 2012-06-18 DIAGNOSIS — M4712 Other spondylosis with myelopathy, cervical region: Secondary | ICD-10-CM | POA: Diagnosis not present

## 2012-06-18 DIAGNOSIS — M47817 Spondylosis without myelopathy or radiculopathy, lumbosacral region: Secondary | ICD-10-CM | POA: Diagnosis not present

## 2012-06-20 ENCOUNTER — Other Ambulatory Visit: Payer: Self-pay | Admitting: Neurosurgery

## 2012-06-20 DIAGNOSIS — M4716 Other spondylosis with myelopathy, lumbar region: Secondary | ICD-10-CM

## 2012-06-20 DIAGNOSIS — M810 Age-related osteoporosis without current pathological fracture: Secondary | ICD-10-CM | POA: Diagnosis not present

## 2012-06-21 ENCOUNTER — Encounter: Payer: Self-pay | Admitting: Internal Medicine

## 2012-06-21 ENCOUNTER — Ambulatory Visit (INDEPENDENT_AMBULATORY_CARE_PROVIDER_SITE_OTHER): Payer: Medicare Other | Admitting: Internal Medicine

## 2012-06-21 VITALS — BP 138/70 | HR 85 | Temp 98.3°F | Ht 64.0 in | Wt 134.0 lb

## 2012-06-21 DIAGNOSIS — J479 Bronchiectasis, uncomplicated: Secondary | ICD-10-CM | POA: Diagnosis not present

## 2012-06-21 LAB — PULMONARY FUNCTION TEST

## 2012-06-21 NOTE — Progress Notes (Signed)
PFT done today. 

## 2012-06-21 NOTE — Patient Instructions (Addendum)
No change in medical recommends  Return 03/2013 for yearly cxr, return sooner if needed

## 2012-06-24 ENCOUNTER — Encounter: Payer: Self-pay | Admitting: Internal Medicine

## 2012-06-24 NOTE — Assessment & Plan Note (Signed)
-   PFT's 06/21/2012 1.67 (91%) ratio 64 and no change,  DLCO 77%  Mild airflow obstruction but no symptoms  I had an extended discussion with the patient today lasting 15 to 20 minutes of a 25 minute visit on the following issues:   Discussed in detail all the  indications, usual  risks and alternatives  relative to the benefits with patient who agrees to continue conservative and minimal rx " I don't want to take any meds I don't absolutely have to"  > ok to treat symptoms only

## 2012-06-24 NOTE — Progress Notes (Signed)
Subjective:    Patient ID: [name], female    DOB: 09-Sep-1932 .   MRN: [id number]  HPI Summary: Pulmonary/ ext f/u ov for bronchiectasis  Primary Provider/Referring Provider: Izola Price    19  yowf never smoker with recurrent cough since age 79 with right middle lobe/lingular syndrome with limited bronchiectatic changes on her most recent CT study dated 09/2005   April 28, 2009 Acute visit. Pt c/o dry cough worse since September occ yellow.  rec: Levaquin 750 x 5 days  Prednisone 4 each am x 2days, 2x2days, 1x2days and stop  Vicodin 5 one every 4 hours for excess cough or pain  Pneumonia @ ov = last one unless the CDC guidelines change  Flu shot today.   August 18, 2010 ov for bronchiectasis / chronic cough worse x 6 months and LUQ pain worse when lie down present 24 hours per day x year already neg abd CT Scan > mucus is clear assoc with nasal drainage also clear, worse in ams with mild nasal congestion. Sinus ct neg so REC 1) Chlortrimeton 4 mg one at bedtime  2) Tagamet 400mg  at bedtime    09/29/2010 ov Eilleen Davoli/ cc cough much better,  No sob. rec GERD   Return yearly for cxr  - if cough worsens use mucinex dm    03/08/2012 f/u ov/Jaydian Santana no longer on h1 and h2 and cough worse indolent /persistent, nightly since sev months esp at hs.  Mucus is white, no sob.  rec Plan A Chlortrimeton 4 mg and Tagamet 400 mg one at bedtime Plan B If cough use mucinex dm total 1200 every 12 hours and add prilosec 20 mg Take 30-60 min before first meal of the day GERD diet  06/21/12 Ryosuke Ericksen/ ov f/u pft's cough better, no purulent sputum,  no sob, not using h1 or h2 at this point. No obvious daytime variabilty or assoc cp or chest tightness, subjective wheeze overt sinus or hb symptoms. No unusual exp hx     Sleeping ok without nocturnal  or early am exacerbation  of respiratory  c/o's or need for noct saba. Also denies any obvious fluctuation of symptoms with weather or environmental changes or other  aggravating or alleviating factors except as outlined above   ROS  The following are not active complaints unless bolded sore throat, dysphagia, dental problems, itching, sneezing,  nasal congestion or excess/ purulent secretions, ear ache,   fever, chills, sweats, unintended wt loss, pleuritic or exertional cp, hemoptysis,  orthopnea pnd or leg swelling, presyncope, palpitations, heartburn, abdominal pain, anorexia, nausea, vomiting, diarrhea  or change in bowel or urinary habits, change in stools or urine, dysuria,hematuria,  rash, arthralgias, visual complaints, headache, numbness weakness or ataxia or problems with walking or coordination,  change in mood/affect or memory.       Past Medical History:  BRONCHIECTASIS (ICD-494.0) see CT scan of the chest dated 09/29/2005  - Pneumovax April 28, 2009 (over 65 so last shot)  - Alpha one Screen sent August 18, 2010 >>>  Neg - Sinus CT August 18, 2010 >>  neg  COUGH, CHRONIC (ICD-786.2)  Left Kidney Cyst  - 04/21/76 [facility] PROCEDURE(S): ULTRASOUND GUIDED AND FLUOROSCOPIC GUIDED LEFT RENAL CYST ASPIRATION AND SCLEROSIS  LUQ Abd pain 2011...............................................................Marland KitchenSchooler  - CT ABD 07/29/10 no etiology                   Objective:   Physical Exam    thin pleasant ambulatory white female in no acute distress.  wt 137 > 137 April 28, 2009 > August 18, 2010 137 >  137 09/29/2010 > 06/21/12 wt 134 HEENT mild turbinate edema. Oropharynx no thrush or excess pnd or cobblestoning. No JVD or cervical adenopathy. Mild accessory muscle hypertrophy. Trachea midline, nl thryroid. Chest was hyperinflated by percussion with diminished breath sounds and moderate increased exp time without wheeze. Hoover sign positive at mid inspiration. Regular rate and rhythm without murmur gallop or rub or increase P2. no edema Abd: no hsm, nl excursion. Ext warm without cyanosis or clubbing.   CXR  03/08/2012 :  Stable chronic  lung disease. No superimposed acute findings are identified.    Assessment & Plan:

## 2012-06-26 ENCOUNTER — Ambulatory Visit
Admission: RE | Admit: 2012-06-26 | Discharge: 2012-06-26 | Disposition: A | Discharge status: Home / Self Care | Payer: Medicare Other | Source: Ambulatory Visit | Attending: Neurosurgery | Admitting: Neurosurgery

## 2012-06-26 DIAGNOSIS — M48061 Spinal stenosis, lumbar region without neurogenic claudication: Secondary | ICD-10-CM | POA: Diagnosis not present

## 2012-06-26 DIAGNOSIS — M4716 Other spondylosis with myelopathy, lumbar region: Secondary | ICD-10-CM

## 2012-07-01 ENCOUNTER — Other Ambulatory Visit: Payer: Self-pay | Admitting: Neurosurgery

## 2012-07-01 DIAGNOSIS — M479 Spondylosis, unspecified: Secondary | ICD-10-CM

## 2012-07-03 ENCOUNTER — Ambulatory Visit
Admission: RE | Admit: 2012-07-03 | Discharge: 2012-07-03 | Disposition: A | Discharge status: Home / Self Care | Payer: Medicare Other | Source: Ambulatory Visit | Attending: Neurosurgery | Admitting: Neurosurgery

## 2012-07-03 VITALS — BP 175/75 | HR 67

## 2012-07-03 DIAGNOSIS — M479 Spondylosis, unspecified: Secondary | ICD-10-CM

## 2012-07-03 DIAGNOSIS — M47817 Spondylosis without myelopathy or radiculopathy, lumbosacral region: Secondary | ICD-10-CM | POA: Diagnosis not present

## 2012-07-03 DIAGNOSIS — M48061 Spinal stenosis, lumbar region without neurogenic claudication: Secondary | ICD-10-CM | POA: Diagnosis not present

## 2012-07-03 DIAGNOSIS — M549 Dorsalgia, unspecified: Secondary | ICD-10-CM

## 2012-07-03 MED ORDER — METHYLPREDNISOLONE ACETATE 40 MG/ML INJ SUSP (RADIOLOG
120.0000 mg | Freq: Once | INTRAMUSCULAR | Status: AC
  Administered 2012-07-03: 120 mg via EPIDURAL

## 2012-07-03 MED ORDER — IOHEXOL 180 MG/ML  SOLN
1.0000 mL | Freq: Once | INTRAMUSCULAR | Status: AC | PRN
  Administered 2012-07-03: 1 mL via EPIDURAL

## 2012-07-05 ENCOUNTER — Other Ambulatory Visit (HOSPITAL_COMMUNITY): Payer: Self-pay | Admitting: Obstetrics

## 2012-07-10 ENCOUNTER — Encounter (HOSPITAL_COMMUNITY): Payer: Self-pay

## 2012-07-10 ENCOUNTER — Ambulatory Visit (HOSPITAL_COMMUNITY)
Admission: RE | Admit: 2012-07-10 | Discharge: 2012-07-10 | Disposition: A | Discharge status: Home / Self Care | Payer: Medicare Other | Source: Ambulatory Visit | Attending: Obstetrics | Admitting: Obstetrics

## 2012-07-10 DIAGNOSIS — M810 Age-related osteoporosis without current pathological fracture: Secondary | ICD-10-CM | POA: Insufficient documentation

## 2012-07-10 MED ORDER — SODIUM CHLORIDE 0.9 % IV SOLN
Freq: Once | INTRAVENOUS | Status: AC
  Administered 2012-07-10: 14:00:00 via INTRAVENOUS

## 2012-07-10 MED ORDER — ZOLEDRONIC ACID 5 MG/100ML IV SOLN
5.0000 mg | Freq: Once | INTRAVENOUS | Status: AC
  Administered 2012-07-10: 5 mg via INTRAVENOUS
  Filled 2012-07-10: qty 100

## 2012-08-05 IMAGING — CT CT PARANASAL SINUSES LIMITED
1 of 2 series · 16 of 19 positions shown, 20 images · non-contrast
Comparison: 01/28/2009 head CT.

CLINICAL DATA: Chronic cough.  Evaluate for sinusitis.

CT PARANASAL SINUSES WITHOUT CONTRAST
TECHNIQUE: Multidetector CT through the paranasal sinuses was
performed using the standard protocol without intravenous contrast.

[Series 4: ltd sinus 3.0 h30s · axial · 0.36mm/px · z∈[-92,+6]mm · 16 of 18 slices shown, 20 images]
[im 2/18  brain]
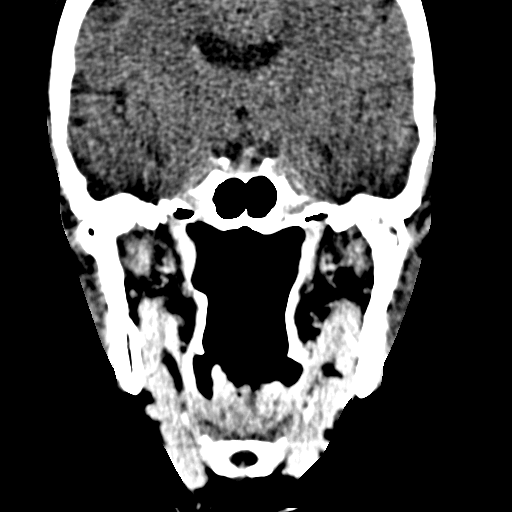
[im 2/18  bone]
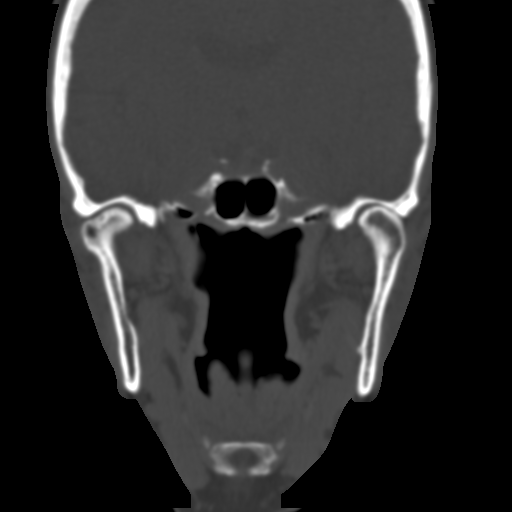
[im 3/18  bone]
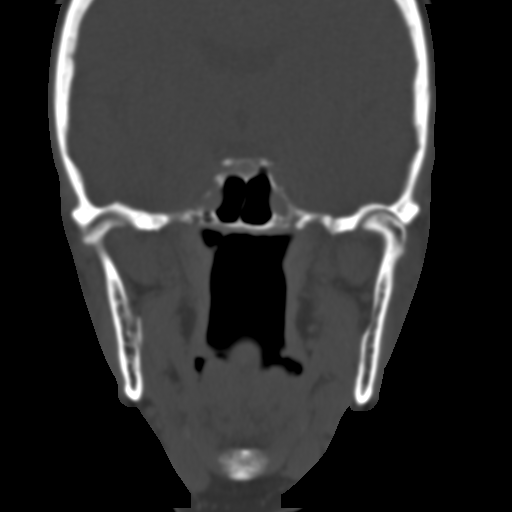
[im 4/18  bone]
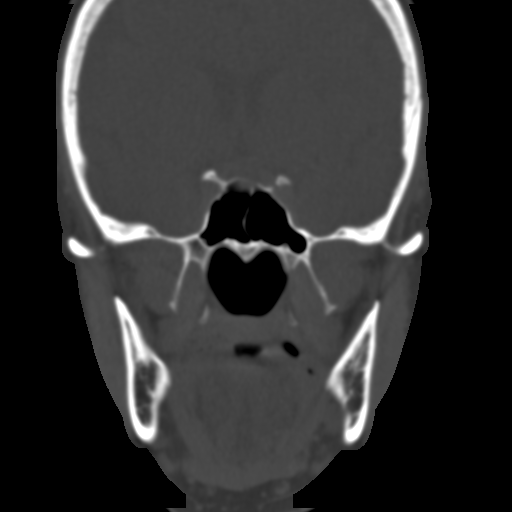
[im 5/18  bone]
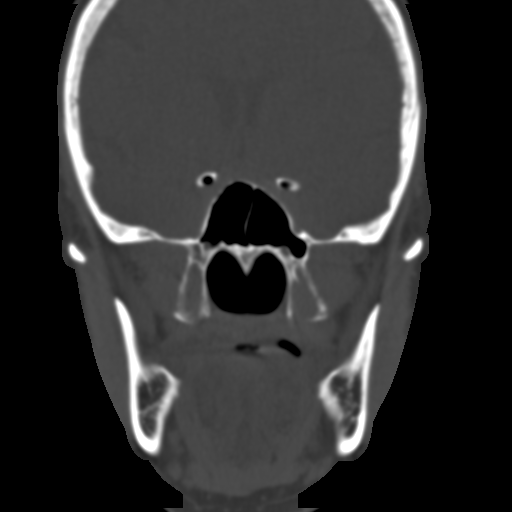
[im 6/18  brain]
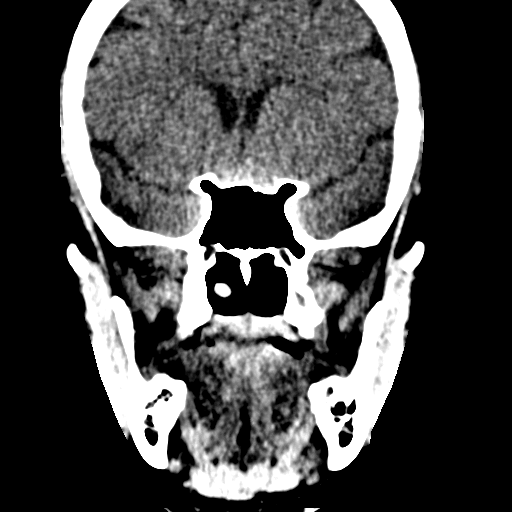
[im 6/18  bone]
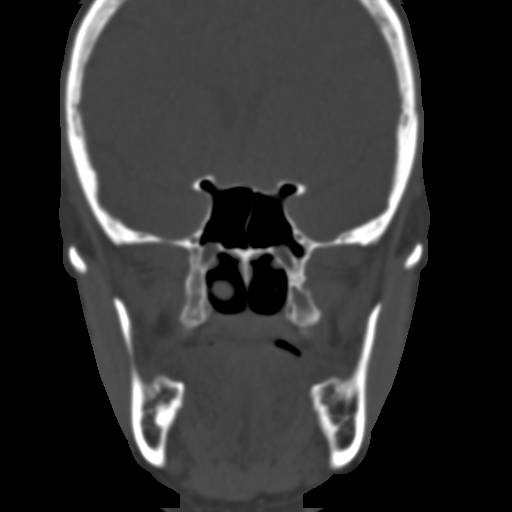
[im 7/18  bone]
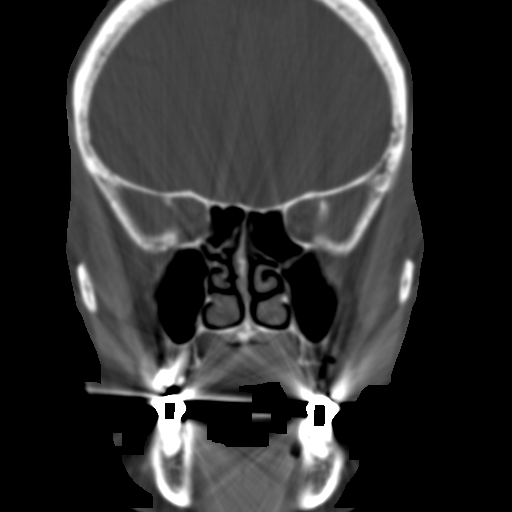
[im 8/18  bone]
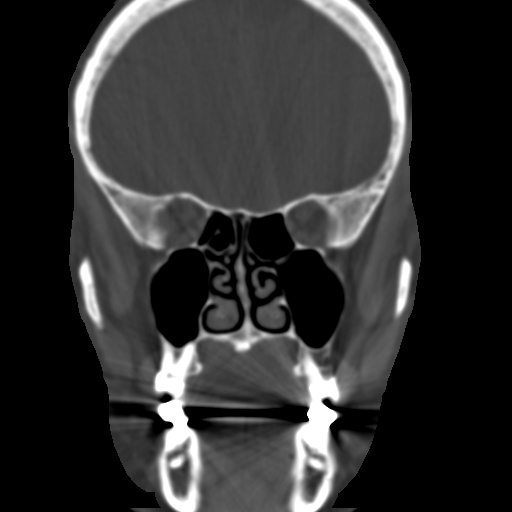
[im 9/18  bone]
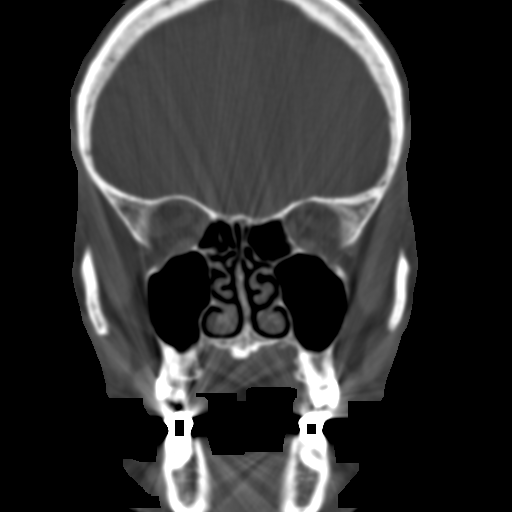
[im 10/18  brain]
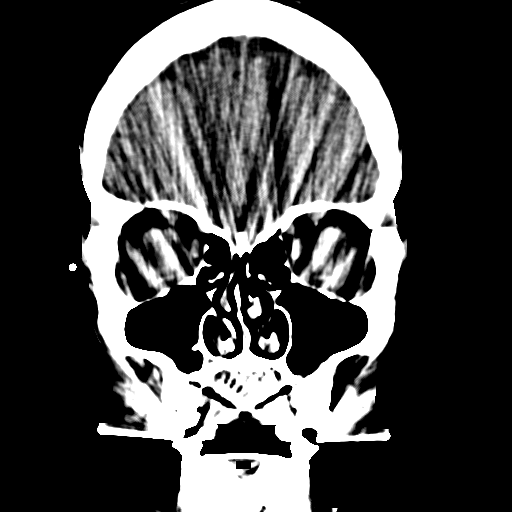
[im 10/18  bone]
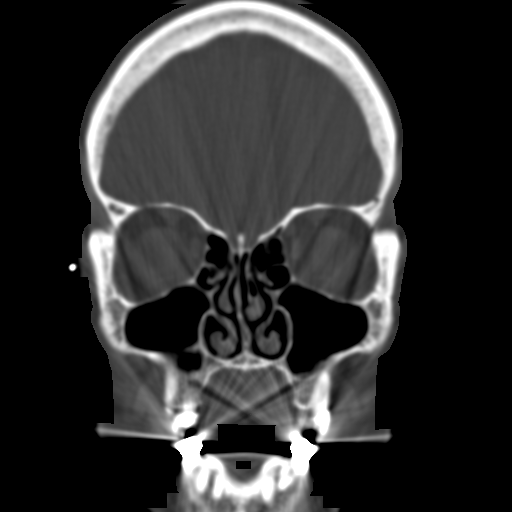
[im 11/18  bone]
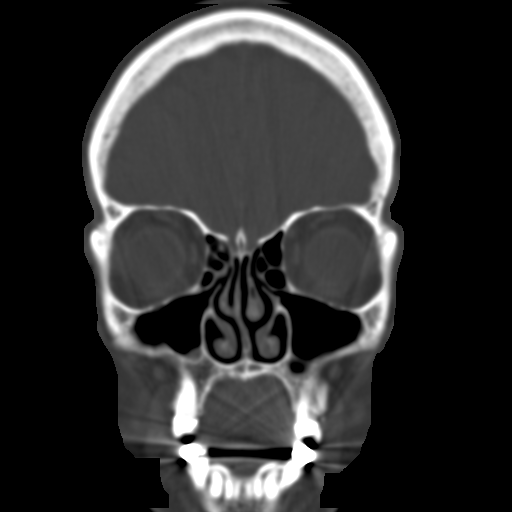
[im 12/18  bone]
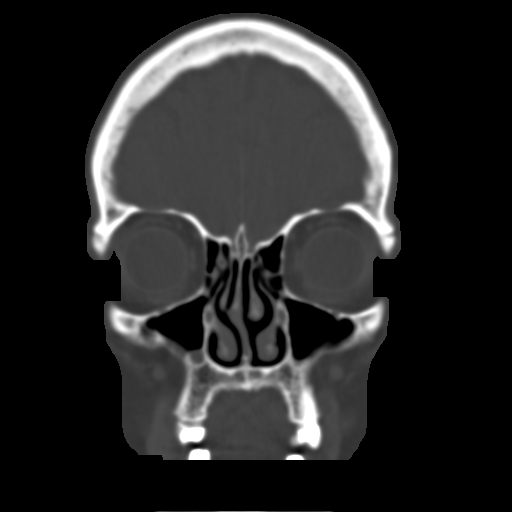
[im 13/18  bone]
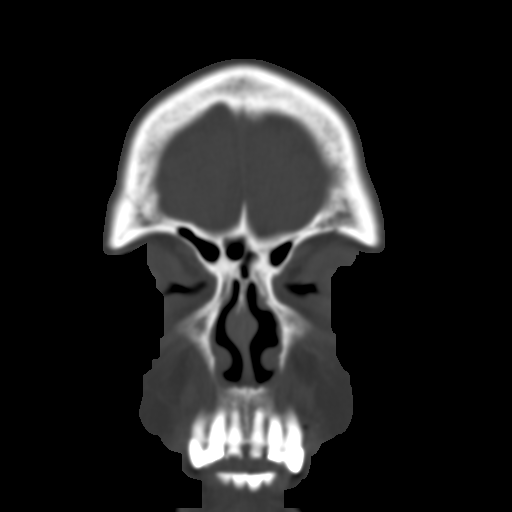
[im 14/18  brain]
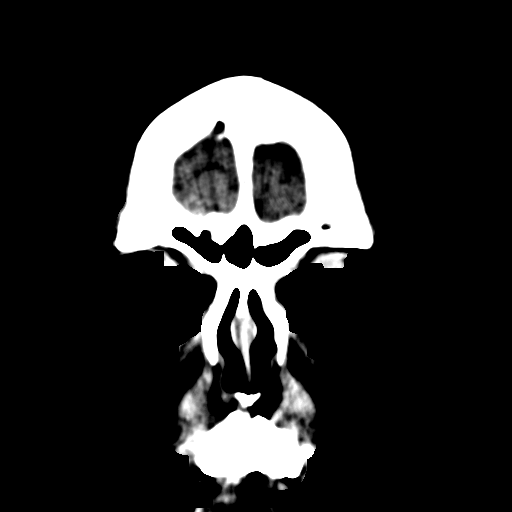
[im 14/18  bone]
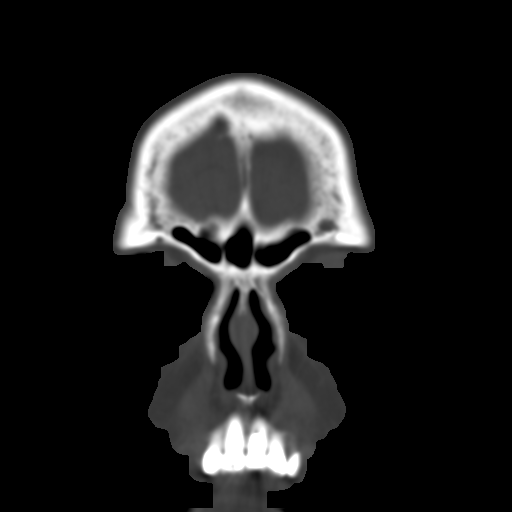
[im 15/18  bone]
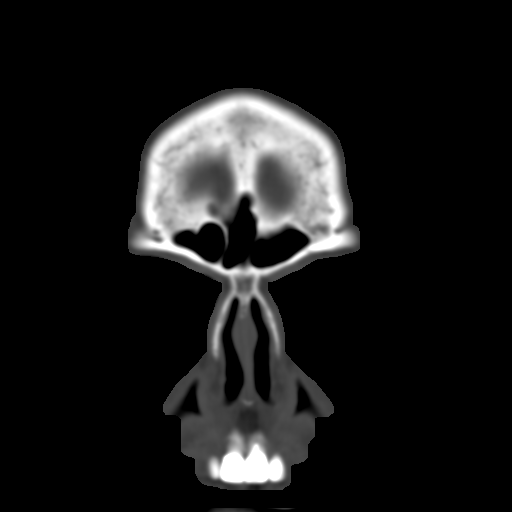
[im 16/18  bone]
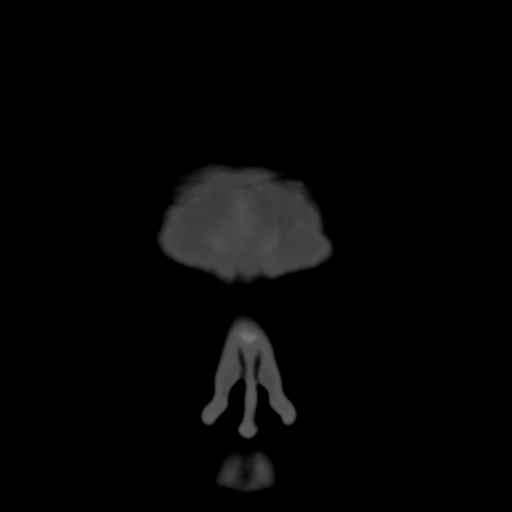
[im 17/18  bone]
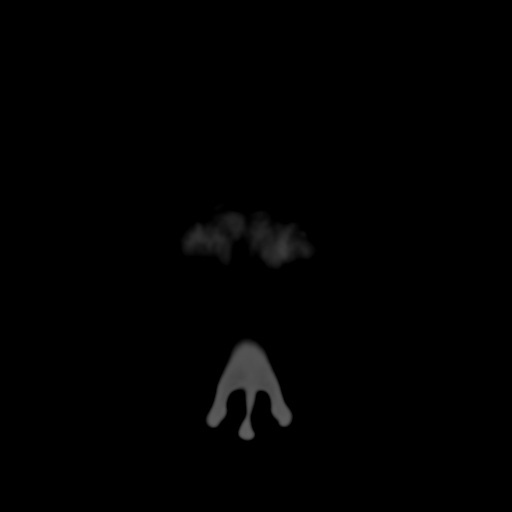

[16 of 19 positions shown; findings below may reference images not displayed]

FINDINGS: Infundibulum patent bilaterally.

Minimal mucosal thickening inferior aspect of the maxillary sinuses
bilaterally.

Visualized aspects of the frontal sinuses, ethmoid sinus air cells
and sphenoid sinus air cells are clear.

Contra bullosa left middle turbinate.  Mild paradoxical
configuration of the right middle turbinate.

Visualized intracranial structures and orbital structures grossly
within normal limits.

Incidentally noted is a small torus palatinus
IMPRESSION: Minimal mucosal thickening inferior aspect of the maxillary sinuses
bilaterally.

## 2012-09-19 DIAGNOSIS — M4712 Other spondylosis with myelopathy, cervical region: Secondary | ICD-10-CM | POA: Diagnosis not present

## 2012-09-19 DIAGNOSIS — M47817 Spondylosis without myelopathy or radiculopathy, lumbosacral region: Secondary | ICD-10-CM | POA: Diagnosis not present

## 2013-01-02 DIAGNOSIS — M25859 Other specified joint disorders, unspecified hip: Secondary | ICD-10-CM | POA: Diagnosis not present

## 2013-01-02 DIAGNOSIS — M47817 Spondylosis without myelopathy or radiculopathy, lumbosacral region: Secondary | ICD-10-CM | POA: Diagnosis not present

## 2013-01-03 ENCOUNTER — Other Ambulatory Visit: Payer: Self-pay | Admitting: Neurosurgery

## 2013-01-03 ENCOUNTER — Ambulatory Visit
Admission: RE | Admit: 2013-01-03 | Discharge: 2013-01-03 | Disposition: A | Discharge status: Home / Self Care | Payer: Medicare Other | Source: Ambulatory Visit | Attending: Neurosurgery | Admitting: Neurosurgery

## 2013-01-03 DIAGNOSIS — R52 Pain, unspecified: Secondary | ICD-10-CM

## 2013-01-03 DIAGNOSIS — M25859 Other specified joint disorders, unspecified hip: Secondary | ICD-10-CM

## 2013-01-03 DIAGNOSIS — M169 Osteoarthritis of hip, unspecified: Secondary | ICD-10-CM | POA: Diagnosis not present

## 2013-01-03 NOTE — Progress Notes (Signed)
BP 188/90  HR 69  She stopped in to have her pressure checked before she left.

## 2013-01-03 NOTE — Progress Notes (Signed)
Pt came to have x-ray of left hip. Pt also c/o feeling numb on the left side of her body. She was told that this is one of the signs of stroke and should see her dr. Immediately or go to ER. Pt wants to have her x-ray anyway. BP 191/83 with HR of 69. Explained to pt again she should see her dr.to follow up with this numbness that started yesterday in her face and now has included her whole left side.

## 2013-01-21 DIAGNOSIS — M545 Low back pain, unspecified: Secondary | ICD-10-CM | POA: Diagnosis not present

## 2013-01-21 DIAGNOSIS — N949 Unspecified condition associated with female genital organs and menstrual cycle: Secondary | ICD-10-CM | POA: Diagnosis not present

## 2013-01-22 DIAGNOSIS — J387 Other diseases of larynx: Secondary | ICD-10-CM | POA: Diagnosis not present

## 2013-01-22 DIAGNOSIS — H9110 Presbycusis, unspecified ear: Secondary | ICD-10-CM | POA: Diagnosis not present

## 2013-01-22 DIAGNOSIS — H9319 Tinnitus, unspecified ear: Secondary | ICD-10-CM | POA: Diagnosis not present

## 2013-01-24 DIAGNOSIS — M1610 Unilateral primary osteoarthritis, unspecified hip: Secondary | ICD-10-CM | POA: Diagnosis not present

## 2013-01-24 DIAGNOSIS — M545 Low back pain, unspecified: Secondary | ICD-10-CM | POA: Diagnosis not present

## 2013-01-27 ENCOUNTER — Other Ambulatory Visit: Payer: Self-pay | Admitting: Dermatology

## 2013-01-27 DIAGNOSIS — C44711 Basal cell carcinoma of skin of unspecified lower limb, including hip: Secondary | ICD-10-CM | POA: Diagnosis not present

## 2013-01-27 DIAGNOSIS — L570 Actinic keratosis: Secondary | ICD-10-CM | POA: Diagnosis not present

## 2013-01-31 DIAGNOSIS — M1610 Unilateral primary osteoarthritis, unspecified hip: Secondary | ICD-10-CM | POA: Diagnosis not present

## 2013-02-11 DIAGNOSIS — M1610 Unilateral primary osteoarthritis, unspecified hip: Secondary | ICD-10-CM | POA: Diagnosis not present

## 2013-02-12 DIAGNOSIS — M25559 Pain in unspecified hip: Secondary | ICD-10-CM | POA: Diagnosis not present

## 2013-02-13 DIAGNOSIS — M1610 Unilateral primary osteoarthritis, unspecified hip: Secondary | ICD-10-CM | POA: Diagnosis not present

## 2013-02-18 DIAGNOSIS — M1610 Unilateral primary osteoarthritis, unspecified hip: Secondary | ICD-10-CM | POA: Diagnosis not present

## 2013-02-20 DIAGNOSIS — M545 Low back pain, unspecified: Secondary | ICD-10-CM | POA: Diagnosis not present

## 2013-03-19 ENCOUNTER — Ambulatory Visit: Payer: Medicare Other | Admitting: Internal Medicine

## 2013-03-19 DIAGNOSIS — M545 Low back pain, unspecified: Secondary | ICD-10-CM | POA: Diagnosis not present

## 2013-03-20 ENCOUNTER — Ambulatory Visit (INDEPENDENT_AMBULATORY_CARE_PROVIDER_SITE_OTHER)
Admission: RE | Admit: 2013-03-20 | Discharge: 2013-03-20 | Disposition: A | Discharge status: Home / Self Care | Payer: Medicare Other | Source: Ambulatory Visit | Attending: Internal Medicine | Admitting: Internal Medicine

## 2013-03-20 ENCOUNTER — Encounter: Payer: Self-pay | Admitting: Internal Medicine

## 2013-03-20 ENCOUNTER — Ambulatory Visit (INDEPENDENT_AMBULATORY_CARE_PROVIDER_SITE_OTHER): Payer: Medicare Other | Admitting: Internal Medicine

## 2013-03-20 VITALS — BP 130/74 | HR 78 | Temp 98.1°F | Ht 64.0 in | Wt 140.0 lb

## 2013-03-20 DIAGNOSIS — J984 Other disorders of lung: Secondary | ICD-10-CM | POA: Diagnosis not present

## 2013-03-20 DIAGNOSIS — J479 Bronchiectasis, uncomplicated: Secondary | ICD-10-CM

## 2013-03-20 MED ORDER — LEVOFLOXACIN 750 MG PO TABS: 750.0000 mg | ORAL_TABLET | Freq: Every day | ORAL | Status: DC

## 2013-03-20 NOTE — Patient Instructions (Addendum)
Any time you are coughing > prilosec 20 mg Take 30-60 min before first meal of the dayTagamet 400 mg one at bedtime  If cough use mucinex dm total 1200 every 12 hours   If sputum turns nasty > levaquin 750 x 5 days   Bronchiectasis =   you have scarring of your bronchial tubes which means that they don't function perfectly normally and mucus tends to pool in certain areas of your lung which can cause pneumonia and further scarring of your lung and bronchial tubes  Whenever you develop cough congestion take mucinex or mucinex dm > these will help keep the mucus loose and flowing but if your condition worsens you need to seek help immediately preferably here or somewhere inside the [facility] system to compare xrays ( worse = darker or bloody mucus or pain on breathing in)    Return in one year for yearly evaluation

## 2013-03-20 NOTE — Assessment & Plan Note (Signed)
-   See CT Chest 09/29/05 Stable bronchiectasis in right middle lobe and lingula.  Scattered tree in bud opacities throughout the lungs, right greater than left,  nonspecific. These can be seen with TB, other infectious etiologies,  bronchiectasis, and bronchiolitis, as well as multiple additional etiologies.  A few slightly larger and more discrete pulmonary nodules are seen which are  nonspecific, recommend followup CT in 3 to 4 months - PFT's 06/21/2012 1.67 (91%) ratio 64 and no change,  DLCO 77%  I had an extended discussion with the patient today lasting 15 to 20 minutes of a 25 minute visit on the following issues:  Reviewed pathophysiology and need to control secondary GERD from exac bronchiectasis and gave her specific action plans for future flares  See instructions for specific recommendations which were reviewed directly with the patient who was given a copy with highlighter outlining the key components.

## 2013-03-20 NOTE — Progress Notes (Signed)
Subjective:    Patient ID: [name], female    DOB: 01/07/1933 .   MRN: [id number]      Primary Provider/Referring Provider: Izola Price   Brief patient profile:  69  yowf never smoker with recurrent cough since age 79 with right middle lobe/lingular syndrome with limited bronchiectatic changes on her most recent CT study dated 09/2005    History of Present Illness  April 28, 2009 Acute visit. Pt c/o dry cough worse since September occ yellow.  rec: Levaquin 750 x 5 days  Prednisone 4 each am x 2days, 2x2days, 1x2days and stop  Vicodin 5 one every 4 hours for excess cough or pain  Pneumonia @ ov = last one unless the CDC guidelines change  Flu shot today.   August 18, 2010 ov for bronchiectasis / chronic cough worse x 6 months and LUQ pain worse when lie down present 24 hours per day x year already neg abd CT Scan > mucus is clear assoc with nasal drainage also clear, worse in ams with mild nasal congestion. Sinus ct neg so REC 1) Chlortrimeton 4 mg one at bedtime  2) Tagamet 400mg  at bedtime        03/08/2012 f/u ov/Yalonda Sample no longer on h1 and h2 and cough worse indolent /persistent, nightly since sev months esp at hs.  Mucus is white, no sob.  rec Plan A Chlortrimeton 4 mg and Tagamet 400 mg one at bedtime Plan B If cough use mucinex dm total 1200 every 12 hours and add prilosec 20 mg Take 30-60 min before first meal of the day GERD diet      03/20/2013 f/u ov/Griffith Santilli re: bronchiectasis Chief Complaint  Patient presents with  . Followup with CXR    Pt reports cough worse x 1 wk- started out with sore throat. Cough is prod with large amounts of yellow to white sputum.    better for about 6 months then  sev months prior to OV   noted indolent onset increased cough qam p stirring up not productive then changed in sept 2014 more productive white then turned yellow with sorethroat x one week     Never took any pepcid or prilosec  No obvious day to day or daytime variabilty or  assoc sob  cp or chest tightness, subjective wheeze overt sinus or hb symptoms. No unusual exp hx or h/o childhood pna/ asthma or knowledge of premature birth.  Sleeping ok without nocturnal  or early am exacerbation  of respiratory  c/o's or need for noct saba. Also denies any obvious fluctuation of symptoms with weather or environmental changes or other aggravating or alleviating factors except as outlined above   Current Medications, Allergies, Complete Past Medical History, Past Surgical History, Family History, and Social History were reviewed in [employer] record.  ROS  The following are not active complaints unless bolded sore throat, dysphagia, dental problems, itching, sneezing,  nasal congestion or excess/ purulent secretions, ear ache,   fever, chills, sweats, unintended wt loss, pleuritic or exertional cp, hemoptysis,  orthopnea pnd or leg swelling, presyncope, palpitations, heartburn, abdominal pain, anorexia, nausea, vomiting, diarrhea  or change in bowel or urinary habits, change in stools or urine, dysuria,hematuria,  rash, arthralgias, visual complaints, headache, numbness weakness or ataxia or problems with walking or coordination,  change in mood/affect or memory.        Past Medical History:  BRONCHIECTASIS (ICD-494.0) see CT scan of the chest dated 09/29/2005  - Pneumovax April 28, 2009 (  over 67 so last shot)  - Alpha one Screen sent August 18, 2010 >>>  Neg - Sinus CT August 18, 2010 >>  neg  COUGH, CHRONIC (ICD-786.2)  Left Kidney Cyst  - 04/21/09 [facility] PROCEDURE(S): ULTRASOUND GUIDED AND FLUOROSCOPIC GUIDED LEFT RENAL CYST ASPIRATION AND SCLEROSIS  LUQ Abd pain 2011...............................................................Marland KitchenSchooler  - CT ABD 07/29/10 no etiology            Objective:   Physical Exam    thin pleasant ambulatory white female in no acute distress.  wt 137 > 137 April 28, 2009 > August 18, 2010 137 >  137 09/29/2010 >  06/21/12 wt 134> 140 03/20/2013  HEENT mild turbinate edema. Oropharynx no thrush or excess pnd or cobblestoning. No JVD or cervical adenopathy. Mild accessory muscle hypertrophy. Trachea midline, nl thryroid. Chest was hyperinflated by percussion with diminished breath sounds and moderate increased exp time without wheeze. Hoover sign positive at mid inspiration. Regular rate and rhythm without murmur gallop or rub or increase P2. no edema Abd: no hsm, nl excursion. Ext warm without cyanosis or clubbing.      CXR  03/20/2013 :  No acute abnormality. Scarring and chronic bronchiectasis changes bilaterally.  Assessment & Plan:

## 2013-03-25 ENCOUNTER — Ambulatory Visit: Payer: Medicare Other | Admitting: Internal Medicine

## 2013-04-07 DIAGNOSIS — M545 Low back pain, unspecified: Secondary | ICD-10-CM | POA: Diagnosis not present

## 2013-05-08 DIAGNOSIS — M810 Age-related osteoporosis without current pathological fracture: Secondary | ICD-10-CM | POA: Diagnosis not present

## 2013-05-08 DIAGNOSIS — Z124 Encounter for screening for malignant neoplasm of cervix: Secondary | ICD-10-CM | POA: Diagnosis not present

## 2013-05-08 DIAGNOSIS — Z01419 Encounter for gynecological examination (general) (routine) without abnormal findings: Secondary | ICD-10-CM | POA: Diagnosis not present

## 2013-05-08 DIAGNOSIS — Z23 Encounter for immunization: Secondary | ICD-10-CM | POA: Diagnosis not present

## 2013-06-02 DIAGNOSIS — Z1231 Encounter for screening mammogram for malignant neoplasm of breast: Secondary | ICD-10-CM | POA: Diagnosis not present

## 2013-06-20 DIAGNOSIS — M810 Age-related osteoporosis without current pathological fracture: Secondary | ICD-10-CM | POA: Diagnosis not present

## 2013-06-24 ENCOUNTER — Other Ambulatory Visit: Payer: Self-pay | Admitting: Obstetrics

## 2013-06-25 DIAGNOSIS — M4712 Other spondylosis with myelopathy, cervical region: Secondary | ICD-10-CM | POA: Diagnosis not present

## 2013-07-15 ENCOUNTER — Ambulatory Visit (HOSPITAL_COMMUNITY)
Admission: RE | Admit: 2013-07-15 | Discharge: 2013-07-15 | Disposition: A | Discharge status: Home / Self Care | Payer: Medicare Other | Source: Ambulatory Visit | Attending: Obstetrics | Admitting: Obstetrics

## 2013-07-15 ENCOUNTER — Other Ambulatory Visit (HOSPITAL_COMMUNITY): Payer: Self-pay | Admitting: Obstetrics

## 2013-07-15 ENCOUNTER — Encounter (HOSPITAL_COMMUNITY): Payer: Self-pay

## 2013-07-15 DIAGNOSIS — M810 Age-related osteoporosis without current pathological fracture: Secondary | ICD-10-CM | POA: Diagnosis not present

## 2013-07-15 MED ORDER — ZOLEDRONIC ACID 5 MG/100ML IV SOLN
5.0000 mg | Freq: Once | INTRAVENOUS | Status: AC
  Administered 2013-07-15: 5 mg via INTRAVENOUS
  Filled 2013-07-15: qty 100

## 2013-07-15 MED ORDER — SODIUM CHLORIDE 0.9 % IV SOLN
Freq: Once | INTRAVENOUS | Status: AC
  Administered 2013-07-15: 14:00:00 via INTRAVENOUS

## 2013-07-15 NOTE — Discharge Instructions (Signed)

## 2013-07-23 DIAGNOSIS — H524 Presbyopia: Secondary | ICD-10-CM | POA: Diagnosis not present

## 2013-07-23 DIAGNOSIS — H5210 Myopia, unspecified eye: Secondary | ICD-10-CM | POA: Diagnosis not present

## 2013-07-23 DIAGNOSIS — H2510 Age-related nuclear cataract, unspecified eye: Secondary | ICD-10-CM | POA: Diagnosis not present

## 2013-07-23 DIAGNOSIS — H52229 Regular astigmatism, unspecified eye: Secondary | ICD-10-CM | POA: Diagnosis not present

## 2013-09-04 ENCOUNTER — Telehealth: Payer: Self-pay | Admitting: *Deleted

## 2013-09-04 NOTE — Telephone Encounter (Signed)
Pt complains of pain beneath the toes of her right foot, feels some better in a shoe.  Pt asked what she could do until her visit with Dr Blenda Mounts, please leave a message on [phone].  I instructed pt to wear a stiffer shoe to decrease the movement of the foot and toes, ice area 3 - 4 times daily for 10 minutes, and if can take antiinflammatory medications take as the package directs.  I encouraged the pt to go to an urgent care or the ER.

## 2013-09-15 ENCOUNTER — Ambulatory Visit (INDEPENDENT_AMBULATORY_CARE_PROVIDER_SITE_OTHER): Payer: Medicare Other

## 2013-09-15 DIAGNOSIS — G5760 Lesion of plantar nerve, unspecified lower limb: Secondary | ICD-10-CM

## 2013-09-15 DIAGNOSIS — R52 Pain, unspecified: Secondary | ICD-10-CM

## 2013-09-15 DIAGNOSIS — M779 Enthesopathy, unspecified: Secondary | ICD-10-CM

## 2013-09-15 MED ORDER — GABAPENTIN 300 MG PO CAPS: 300.0000 mg | ORAL_CAPSULE | Freq: Every day | ORAL | Status: DC

## 2013-09-15 MED ORDER — MELOXICAM 15 MG PO TABS: 15.0000 mg | ORAL_TABLET | Freq: Every day | ORAL | Status: DC

## 2013-09-15 NOTE — Patient Instructions (Addendum)
ICE INSTRUCTIONS  Apply ice or cold pack to the affected area at least 3 times a day for 10-15 minutes each time.  You should also use ice after prolonged activity or vigorous exercise.  Do not apply ice longer than 20 minutes at one time.  Always keep a cloth between your skin and the ice pack to prevent burns.  Being consistent and following these instructions will help control your symptoms.  We suggest you purchase a gel ice pack because they are reusable and do bit leak.  Some of them are designed to wrap around the area.  Use the method that works best for you.  Here are some other suggestions for icing.   Use a frozen bag of peas or corn-inexpensive and molds well to your body, usually stays frozen for 10 to 20 minutes. Wet a towel with cold water and squeeze out the excess until it's damp.  Place in a bag in the freezer for 20 minutes. Then remove and use.    Morton's Neuroma Neuralgia (nerve pain) or neuroma (benign [non-cancerous] nerve tumor) may develop on any interdigital nerve. The interdigital nerves (nerves between digits) of the foot travel beneath and between the metatarsals (long bones of the fore foot) and pass the nerve endings to the toes. The third interdigital is a common place for a small neuroma to form called Morton's neuroma. Another nerve to be affected commonly is the fourth interdigital nerve. This would be in approximately in the area of the base or ball under the bottom of your fourth toe. This condition occurs more commonly in women and is usually on one side. It is usually first noticed by pain radiating (spreading) to the ball of the foot or to the toes. CAUSES The cause of interdigital neuralgia may be from low grade repetitive trauma (damage caused by an accident) as in activities causing a repeated pounding of the foot (running, jumping etc.). It is also caused by improper footwear or recent loss of the fatty padding on the bottom of the foot. TREATMENT  The  condition often resolves (goes away) simply with decreasing activity if that is thought to be the cause. Proper shoes are beneficial. Orthotics (special foot support aids) such as a metatarsal bar are often beneficial. This condition usually responds to conservative therapy, however if surgery is necessary it usually brings complete relief. HOME CARE INSTRUCTIONS  Apply ice to the area of soreness for 15-20 minutes, 03-04 times per day, while awake for the first 2 days. Put ice in a plastic bag and place a towel between the bag of ice and your skin. Only take over-the-counter or prescription medicines for pain, discomfort, or fever as directed by your caregiver. MAKE SURE YOU:  Understand these instructions. Will watch your condition. Will get help right away if you are not doing well or get worse. Document Released: 08/28/2000 Document Revised: 08/14/2011 Document Reviewed: 05/22/2005 [facility] Patient Information 2014 [place].

## 2013-09-15 NOTE — Progress Notes (Signed)
Subjective:    Patient ID: [name], female    DOB: 02/02/1933, 80 y.o.   MRN: [id number]  HPI PT STATED RT FOOT BOTTOM IS SORE FOR 2 WEEKS. THE FOOT IS GETTING BETTER. THE FOOT GET AGGRAVATED PUTTING PRESSURE ON IT AND WALKING BAREFOOT. TRIED TO SOAK WITH EPSON SALT BUT DID NOT HELP.    Review of Systems  All other systems reviewed and are negative.      Objective:   Physical Exam Okay objective findings follows pedal pulses are palpable epicritic and proprioceptive sensations intact and symmetric bilateral there is normal plantar response DTRs are listed dermatologic the skin color pigment normal hair growth absent nails unremarkable or improved from previous treatment orthopedic biomechanical exam reveals rectus foot type semirigid digital contractures patient was high-heeled putting shoes a lot there is pain on direct lateral compression second and third intermetatarsal spaces bilateral also second and third met heads although not as bad as the spaces were interspace bilateral right foot and left foot are both symptomatic on palpation of the patient reports more problems in the right foot. Hurts to go barefoot is also 1 with complaints patient does have a history of some neuropathy in the past also had some hip problems she is taking hydrocodone for that to help her sleep       Assessment & Plan:  Based on clinical findings and x-rays which are unremarkable patient does have signs consistent with Morton's neuroma second somewhat third interspaces bilateral right more so than left at this time patient placed on a regimen of meloxicam 15 mg once daily after the day also gabapentin 3 mg each bedtime reappointed in 3-4 weeks for reevaluation stressed a wide accommodative shoe at all times suggest things I crocs for around the house or [place] no barefoot or flimsy shoes or flip-flops at  Richard Sikora DPM 

## 2013-10-10 DIAGNOSIS — R1032 Left lower quadrant pain: Secondary | ICD-10-CM | POA: Diagnosis not present

## 2014-01-07 DIAGNOSIS — G43809 Other migraine, not intractable, without status migrainosus: Secondary | ICD-10-CM | POA: Diagnosis not present

## 2014-01-07 DIAGNOSIS — M4712 Other spondylosis with myelopathy, cervical region: Secondary | ICD-10-CM | POA: Diagnosis not present

## 2014-01-07 DIAGNOSIS — M47817 Spondylosis without myelopathy or radiculopathy, lumbosacral region: Secondary | ICD-10-CM | POA: Diagnosis not present

## 2014-01-07 DIAGNOSIS — M25859 Other specified joint disorders, unspecified hip: Secondary | ICD-10-CM | POA: Diagnosis not present

## 2014-03-24 DIAGNOSIS — Z23 Encounter for immunization: Secondary | ICD-10-CM | POA: Diagnosis not present

## 2014-04-06 ENCOUNTER — Telehealth: Payer: Self-pay | Admitting: Internal Medicine

## 2014-04-06 ENCOUNTER — Ambulatory Visit: Payer: Medicare Other | Admitting: Internal Medicine

## 2014-04-06 MED ORDER — AZITHROMYCIN 250 MG PO TABS: ORAL_TABLET | ORAL | Status: DC

## 2014-04-06 NOTE — Telephone Encounter (Signed)
Pt states that she received flu vaccine on 03/31/14 and symptoms started on Friday 04/03/14 Pt c/o cough, sore throat, head cold(runny nose, PND) and body aches. Pt states that her mucus production is white.  Cough is worse and mainly present during the daytime. Denies chest congestion and fever. Using Excedrin for body aches. Pt cancelled appt today with MW and rescheduled to 04/27/14 Pt aware to take Mucinex DM PRN congestion per last visit with MW-- has abx on hand Pt wanting to know if she needs to start her antibiotic at this time or wait to see if mucus color changes.   Allergies  Allergen Reactions  . Ciprofloxacin Other (See Comments)    triggered a migraine that was thought to be a TIA  . Penicillins Rash  . Sulfonamide Derivatives Swelling  . Chlorpheniramine Other (See Comments)    Headache, stomach ache, bladder "not working well"   Please advise Dr Melvyn Novas. Thanks.

## 2014-04-06 NOTE — Telephone Encounter (Signed)
Pt aware of MW's recs.  Z-pak sent in to approved pharmacy by pt.  Nothing further needed.

## 2014-04-06 NOTE — Telephone Encounter (Signed)
zpak  F/u by end of week if not better

## 2014-04-24 DIAGNOSIS — H2513 Age-related nuclear cataract, bilateral: Secondary | ICD-10-CM | POA: Diagnosis not present

## 2014-04-27 ENCOUNTER — Ambulatory Visit (INDEPENDENT_AMBULATORY_CARE_PROVIDER_SITE_OTHER): Payer: Medicare Other | Admitting: Internal Medicine

## 2014-04-27 ENCOUNTER — Encounter: Payer: Self-pay | Admitting: Internal Medicine

## 2014-04-27 ENCOUNTER — Ambulatory Visit (INDEPENDENT_AMBULATORY_CARE_PROVIDER_SITE_OTHER)
Admission: RE | Admit: 2014-04-27 | Discharge: 2014-04-27 | Disposition: A | Discharge status: Home / Self Care | Payer: Medicare Other | Source: Ambulatory Visit | Attending: Internal Medicine | Admitting: Internal Medicine

## 2014-04-27 VITALS — BP 126/84 | HR 83 | Ht 64.0 in | Wt 139.0 lb

## 2014-04-27 DIAGNOSIS — Z23 Encounter for immunization: Secondary | ICD-10-CM | POA: Diagnosis not present

## 2014-04-27 DIAGNOSIS — J471 Bronchiectasis with (acute) exacerbation: Secondary | ICD-10-CM

## 2014-04-27 DIAGNOSIS — R05 Cough: Secondary | ICD-10-CM | POA: Diagnosis not present

## 2014-04-27 MED ORDER — FLUTTER DEVI: Status: DC

## 2014-04-27 MED ORDER — DOXYCYCLINE HYCLATE 100 MG PO TABS: 100.0000 mg | ORAL_TABLET | Freq: Two times a day (BID) | ORAL | Status: DC

## 2014-04-27 MED ORDER — BENZONATATE 200 MG PO CAPS: ORAL_CAPSULE | ORAL | Status: DC

## 2014-04-27 MED ORDER — PREDNISONE 10 MG PO TABS: ORAL_TABLET | ORAL | Status: DC

## 2014-04-27 NOTE — Patient Instructions (Addendum)
Prevnar 13 today   Any time you are coughing  > prilosec 20 mg Take 30-60 min before first meal of the day and Tagamet 400 mg one at bedtime  If cough use mucinex dm total 1200 every 12 hours > try 600 every 6 hours prn and if that doesn't work then try benzonate 200mg  every 6 hours as needed   Please remember to go to the  x-ray department downstairs for your tests - we will call you with the results when they are available.  If mucus turn nasty try doxycline 100 mg twice daily with glass of water before eating   Use flutter valve first thing in am and as much as possible during the day  Prednisone 10 mg take  4 each am x 2 days,   2 each am x 2 days,  1 each am x 2 days and stop   Please schedule a follow up visit in 3 months but call sooner if needed

## 2014-04-27 NOTE — Progress Notes (Signed)
Subjective:    Patient ID: [name], female    DOB: 17-Jun-1932 .   MRN: [id number]      Primary Provider/Referring Provider: Doyle Lane   Brief patient profile:  80  yowf never smoker with recurrent cough since age 80 with right middle lobe/lingular syndrome with limited bronchiectatic changes on her most recent CT study dated 09/2005    History of Present Illness  April 28, 2009 Acute visit. Pt c/o dry cough worse since September occ yellow.  rec: Levaquin 750 x 5 days  Prednisone 4 each am x 2days, 2x2days, 1x2days and stop  Vicodin 5 one every 4 hours for excess cough or pain  Pneumonia @ ov = last one unless the CDC guidelines change  Flu shot today.   August 18, 2010 ov for bronchiectasis / chronic cough worse x 6 months and LUQ pain worse when lie down present 24 hours per day x year already neg abd CT Scan > mucus is clear assoc with nasal drainage also clear, worse in ams with mild nasal congestion. Sinus ct neg so REC 1) Chlortrimeton 4 mg one at bedtime  2) Tagamet 400mg  at bedtime        03/08/2012 f/u ov/[name] no longer on h1 and h2 and cough worse indolent /persistent, nightly since sev months esp at hs.  Mucus is white, no sob.  rec Plan A Chlortrimeton 4 mg and Tagamet 400 mg one at bedtime Plan B If cough use mucinex dm total 1200 every 12 hours and add prilosec 20 mg Take 30-60 min before first meal of the day GERD diet      03/20/2013 f/u ov/[name] re: bronchiectasis Chief Complaint  Patient presents with  . Followup with CXR    Pt reports cough worse x 1 wk- started out with sore throat. Cough is prod with large amounts of yellow to white sputum.    better for about 6 months then  sev months prior to OV   noted indolent onset increased cough qam p stirring up not productive then changed in sept 2014 more productive white then turned yellow with sorethroat x one week     Never took any pepcid or prilosec rec Any time you are coughing > prilosec 20 mg  Take 30-60 min before first meal of the dayTagamet 400 mg one at bedtime If cough use mucinex dm total 1200 every 12 hours  If sputum turns nasty > levaquin 750 x 5 days > hurt  in joints esp elbows so stopped it  Bronchiectasis pathophys reviewed   04/27/2014 f/u ov/[name] re: flare of cough esp in am/ did not follow instructions re mucinex dm or prilosec Chief Complaint  Patient presents with  . Follow-up    Pt c/o increased cough since had flu shot 03/24/14. Cough is esp worse in the am and is prod with minimal clear to yellow sputum.     pred helps a lot in past / intol to levaquin and multiple other abx categories noted  Not limited by breathing from desired activities  Though very sedentary   No obvious day to day or daytime variabilty or assoc  cp or chest tightness, subjective wheeze overt sinus or hb symptoms. No unusual exp hx or h/o childhood pna/ asthma or knowledge of premature birth.  Sleeping ok without nocturnal  or early am exacerbation  of respiratory  c/o's or need for noct saba. Also denies any obvious fluctuation of symptoms with weather or environmental changes or other aggravating or  alleviating factors except as outlined above   Current Medications, Allergies, Complete Past Medical History, Past Surgical History, Family History, and Social History were reviewed in [employer] record.  ROS  The following are not active complaints unless bolded sore throat, dysphagia, dental problems, itching, sneezing,  nasal congestion or excess/ purulent secretions, ear ache,   fever, chills, sweats, unintended wt loss, pleuritic or exertional cp, hemoptysis,  orthopnea pnd or leg swelling, presyncope, palpitations, heartburn, abdominal pain, anorexia, nausea, vomiting, diarrhea  or change in bowel or urinary habits, change in stools or urine, dysuria,hematuria,  rash, arthralgias, visual complaints, headache, numbness weakness or ataxia or problems with walking or  coordination,  change in mood/affect or memory.        Past Medical History:  BRONCHIECTASIS (ICD-494.0) see CT scan of the chest dated 09/29/2005  - Pneumovax April 28, 2009 (over 65 so last one)  Prevnar given 04/27/2014   - Alpha one Screen sent August 18, 2010 >>>  Neg - Sinus CT August 18, 2010 >>  neg  COUGH, CHRONIC (ICD-786.2)  Left Kidney Cyst  - 04/21/09 [facility] PROCEDURE(S): ULTRASOUND GUIDED AND FLUOROSCOPIC GUIDED LEFT RENAL CYST ASPIRATION AND SCLEROSIS  LUQ Abd pain 2011...............................................................Marland KitchenSchooler  - CT ABD 07/29/10 no etiology            Objective:   Physical Exam    thin pleasant ambulatory white female in no acute distress. /congested cough/ rattling on exp  wt 137 > 137 April 28, 2009 > August 18, 2010 137 >  137 09/29/2010 > 06/21/12 wt 134> 140 03/20/2013 > 04/27/2014  139 HEENT mild turbinate edema. Oropharynx no thrush or excess pnd or cobblestoning. No JVD or cervical adenopathy. Mild accessory muscle hypertrophy. Trachea midline, nl thryroid. Chest was hyperinflated by percussion with diminished breath sounds and moderate increased exp time without wheeze. Hoover sign positive at mid inspiration. Regular rate and rhythm without murmur gallop or rub or increase P2. no edema Abd: no hsm, nl excursion. Ext warm without cyanosis or clubbing.       CXR  04/27/2014 : Chronic postinflammatory changes bilaterally. New vague density in the left mid lung zone is probably inflammatory as well    Assessment & Plan:

## 2014-04-27 NOTE — Assessment & Plan Note (Addendum)
-   See CT Chest 09/29/05 Stable bronchiectasis in right middle lobe and lingula.  Scattered tree in bud opacities throughout the lungs, right greater than left,  nonspecific. These can be seen with TB, other infectious etiologies,  bronchiectasis, and bronchiolitis, as well as multiple additional etiologies.  A few slightly larger and more discrete pulmonary nodules are seen which are  nonspecific, recommend followup CT in 3 to 4 months - PFT's 06/21/2012 1.67 (91%) ratio 64 and no change,  DLCO 77%  I had an extended discussion with the patient today lasting 15 to 20 minutes of a 25 minute visit on the following issues: Reviewed instructions again line by line re approp use of mucinex/ max dosing/ and need for prevnar since running out of abx choices  rec add flutter valve Try doxy x 10d , pred x 6 for flares  Also rec max gerd rx when cough flares for cyclical mechanism where cough causes gerd causes cough     Each maintenance medication was reviewed in detail including most importantly the difference between maintenance and as needed and under what circumstances the prns are to be used.  Please see instructions for details which were reviewed in writing and the patient given a copy.

## 2014-04-27 NOTE — Progress Notes (Signed)
Quick Note:  Spoke with pt and notified of results per Dr. Wert. Pt verbalized understanding and denied any questions.  ______ 

## 2014-05-05 DIAGNOSIS — Z8262 Family history of osteoporosis: Secondary | ICD-10-CM | POA: Diagnosis not present

## 2014-05-05 DIAGNOSIS — Z780 Asymptomatic menopausal state: Secondary | ICD-10-CM | POA: Diagnosis not present

## 2014-05-12 DIAGNOSIS — M810 Age-related osteoporosis without current pathological fracture: Secondary | ICD-10-CM | POA: Diagnosis not present

## 2014-06-03 DIAGNOSIS — Z1231 Encounter for screening mammogram for malignant neoplasm of breast: Secondary | ICD-10-CM | POA: Diagnosis not present

## 2014-06-24 DIAGNOSIS — M810 Age-related osteoporosis without current pathological fracture: Secondary | ICD-10-CM | POA: Diagnosis not present

## 2014-07-03 ENCOUNTER — Other Ambulatory Visit: Payer: Self-pay | Admitting: Obstetrics

## 2014-07-22 ENCOUNTER — Encounter (HOSPITAL_COMMUNITY): Payer: Self-pay | Admitting: Emergency Medicine

## 2014-07-22 ENCOUNTER — Other Ambulatory Visit (HOSPITAL_COMMUNITY): Payer: Self-pay | Admitting: Obstetrics

## 2014-07-22 ENCOUNTER — Encounter (HOSPITAL_COMMUNITY): Payer: Self-pay

## 2014-07-22 ENCOUNTER — Emergency Department (HOSPITAL_COMMUNITY)
Admission: EM | Admit: 2014-07-22 | Discharge: 2014-07-22 | Disposition: A | Discharge status: Home / Self Care | Payer: Medicare Other | Attending: Emergency Medicine | Admitting: Emergency Medicine

## 2014-07-22 ENCOUNTER — Emergency Department (HOSPITAL_COMMUNITY): Payer: Medicare Other

## 2014-07-22 ENCOUNTER — Ambulatory Visit (HOSPITAL_COMMUNITY)
Admission: RE | Admit: 2014-07-22 | Discharge: 2014-07-22 | Disposition: A | Discharge status: Home / Self Care | Payer: Medicare Other | Source: Ambulatory Visit | Attending: Obstetrics | Admitting: Obstetrics

## 2014-07-22 DIAGNOSIS — R200 Anesthesia of skin: Secondary | ICD-10-CM | POA: Diagnosis not present

## 2014-07-22 DIAGNOSIS — R51 Headache: Secondary | ICD-10-CM | POA: Diagnosis not present

## 2014-07-22 DIAGNOSIS — Z880 Allergy status to penicillin: Secondary | ICD-10-CM | POA: Diagnosis not present

## 2014-07-22 DIAGNOSIS — Z79899 Other long term (current) drug therapy: Secondary | ICD-10-CM | POA: Diagnosis not present

## 2014-07-22 DIAGNOSIS — R202 Paresthesia of skin: Secondary | ICD-10-CM | POA: Insufficient documentation

## 2014-07-22 DIAGNOSIS — M810 Age-related osteoporosis without current pathological fracture: Secondary | ICD-10-CM | POA: Diagnosis not present

## 2014-07-22 DIAGNOSIS — R209 Unspecified disturbances of skin sensation: Secondary | ICD-10-CM | POA: Diagnosis not present

## 2014-07-22 DIAGNOSIS — Z792 Long term (current) use of antibiotics: Secondary | ICD-10-CM | POA: Insufficient documentation

## 2014-07-22 DIAGNOSIS — I159 Secondary hypertension, unspecified: Secondary | ICD-10-CM | POA: Insufficient documentation

## 2014-07-22 DIAGNOSIS — Z8659 Personal history of other mental and behavioral disorders: Secondary | ICD-10-CM | POA: Insufficient documentation

## 2014-07-22 DIAGNOSIS — M542 Cervicalgia: Secondary | ICD-10-CM | POA: Diagnosis present

## 2014-07-22 DIAGNOSIS — Z8709 Personal history of other diseases of the respiratory system: Secondary | ICD-10-CM | POA: Diagnosis not present

## 2014-07-22 DIAGNOSIS — Z85828 Personal history of other malignant neoplasm of skin: Secondary | ICD-10-CM | POA: Insufficient documentation

## 2014-07-22 LAB — BASIC METABOLIC PANEL
ANION GAP: 8 (ref 5–15)
BUN: 20 mg/dL (ref 6–23)
CO2: 23 mmol/L (ref 19–32)
Calcium: 9.2 mg/dL (ref 8.4–10.5)
Chloride: 112 mmol/L (ref 96–112)
Creatinine, Ser: 0.78 mg/dL (ref 0.50–1.10)
GFR calc non Af Amer: 77 mL/min — ABNORMAL LOW (ref >90)
GFR, EST AFRICAN AMERICAN: 89 mL/min — AB (ref >90)
GLUCOSE: 77 mg/dL (ref 70–99)
POTASSIUM: 3.8 mmol/L (ref 3.5–5.1)
Sodium: 143 mmol/L (ref 135–145)

## 2014-07-22 LAB — CBC
HEMATOCRIT: 40.6 % (ref 36.0–46.0)
HEMOGLOBIN: 13.5 g/dL (ref 12.0–15.0)
MCH: 30.9 pg (ref 26.0–34.0)
MCHC: 33.3 g/dL (ref 30.0–36.0)
MCV: 92.9 fL (ref 78.0–100.0)
Platelets: 200 10*3/uL (ref 150–400)
RBC: 4.37 MIL/uL (ref 3.87–5.11)
RDW: 13 % (ref 11.5–15.5)
WBC: 6.8 10*3/uL (ref 4.0–10.5)

## 2014-07-22 MED ORDER — ZOLEDRONIC ACID 5 MG/100ML IV SOLN
5.0000 mg | Freq: Once | INTRAVENOUS | Status: AC
  Administered 2014-07-22: 5 mg via INTRAVENOUS
  Filled 2014-07-22: qty 100

## 2014-07-22 MED ORDER — SODIUM CHLORIDE 0.9 % IV SOLN
Freq: Once | INTRAVENOUS | Status: AC
  Administered 2014-07-22: 12:00:00 via INTRAVENOUS

## 2014-07-22 NOTE — Progress Notes (Addendum)
1310  Patient states she felt a little dizzy and her eyes felt funny  after reclast infusion.   1345 Patient states left side of her face and teeth on left felt numb. Called [facility] Response RN to come help assess patient.  1410  Patient taken to ER for evaluation of elevated BP and numbness on left side of face.

## 2014-07-22 NOTE — ED Notes (Signed)
Patient transported to MRI 

## 2014-07-22 NOTE — Discharge Instructions (Signed)

## 2014-07-22 NOTE — ED Notes (Signed)
Bed: RESA Expected date:  Expected time:  Means of arrival:  Comments: Short stay

## 2014-07-22 NOTE — ED Notes (Signed)
Patient returned from MRI.

## 2014-07-22 NOTE — ED Notes (Signed)
Pt arrived from short-stay with reported hypertension during Reclast infusion. No other c/c. Patient denies dizziness but is having a "strange sensation on the left side with some blurry vision." Denies numbness/tingling unilaterally. Equal grips and dorsiflexion and extension. Hx TIA. Face is symmetrical. No other c/c.

## 2014-07-22 NOTE — Discharge Instructions (Signed)
It was our pleasure to provide your ER care today - we hope that you feel better.  Take a baby aspirin a day.  Follow up with your doctor in the next couple days for recheck, and recheck of blood pressure.  Return to ER if worse, new symptoms, fevers, severe pain, one-sided numbness/weakness, change in speech or vision, other concern.    Paresthesia Paresthesia is an abnormal burning or prickling sensation. This sensation is generally felt in the hands, arms, legs, or feet. However, it may occur in any part of the body. It is usually not painful. The feeling may be described as:  Tingling or numbness.  "Pins and needles."  Skin crawling.  Buzzing.  Limbs "falling asleep."  Itching. Most people experience temporary (transient) paresthesia at some time in their lives. CAUSES  Paresthesia may occur when you breathe too quickly (hyperventilation). It can also occur without any apparent cause. Commonly, paresthesia occurs when pressure is placed on a nerve. The feeling quickly goes away once the pressure is removed. For some people, however, paresthesia is a long-lasting (chronic) condition caused by an underlying disorder. The underlying disorder may be:  A traumatic, direct injury to nerves. Examples include a:  Broken (fractured) neck.  Fractured skull.  A disorder affecting the brain and spinal cord (central nervous system). Examples include:  Transverse myelitis.  Encephalitis.  Transient ischemic attack.  Multiple sclerosis.  Stroke.  Tumor or blood vessel problems, such as an arteriovenous malformation pressing against the brain or spinal cord.  A condition that damages the peripheral nerves (peripheral neuropathy). Peripheral nerves are not part of the brain and spinal cord. These conditions include:  Diabetes.  Peripheral vascular disease.  Nerve entrapment syndromes, such as carpal tunnel syndrome.  Shingles.  Hypothyroidism.  Vitamin B12  deficiencies.  Alcoholism.  Heavy metal poisoning (lead, arsenic).  Rheumatoid arthritis.  Systemic lupus erythematosus. DIAGNOSIS  Your caregiver will attempt to find the underlying cause of your paresthesia. Your caregiver may:  Take your medical history.  Perform a physical exam.  Order various lab tests.  Order imaging tests. TREATMENT  Treatment for paresthesia depends on the underlying cause. HOME CARE INSTRUCTIONS  Avoid drinking alcohol.  You may consider massage or acupuncture to help relieve your symptoms.  Keep all follow-up appointments as directed by your caregiver. SEEK IMMEDIATE MEDICAL CARE IF:   You feel weak.  You have trouble walking or moving.  You have problems with speech or vision.  You feel confused.  You cannot control your bladder or bowel movements.  You feel numbness after an injury.  You faint.  Your burning or prickling feeling gets worse when walking.  You have pain, cramps, or dizziness.  You develop a rash. MAKE SURE YOU:  Understand these instructions.  Will watch your condition.  Will get help right away if you are not doing well or get worse. Document Released: 05/12/2002 Document Revised: 08/14/2011 Document Reviewed: 02/10/2011 [facility] Patient Information 2015 [place], [place]. This information is not intended to replace advice given to you by your health care provider. Make sure you discuss any questions you have with your health care provider.     Hypertension Hypertension, commonly called high blood pressure, is when the force of blood pumping through your arteries is too strong. Your arteries are the blood vessels that carry blood from your heart throughout your body. A blood pressure reading consists of a higher number over a lower number, such as 110/72. The higher number (systolic) is the  pressure inside your arteries when your heart pumps. The lower number (diastolic) is the pressure inside your arteries when  your heart relaxes. Ideally you want your blood pressure below 120/80. Hypertension forces your heart to work harder to pump blood. Your arteries may become narrow or stiff. Having hypertension puts you at risk for heart disease, stroke, and other problems.  RISK FACTORS Some risk factors for high blood pressure are controllable. Others are not.  Risk factors you cannot control include:   Race. You may be at higher risk if you are African American.  Age. Risk increases with age.  Gender. Men are at higher risk than women before age 61 years. After age 10, women are at higher risk than men. Risk factors you can control include:  Not getting enough exercise or physical activity.  Being overweight.  Getting too much fat, sugar, calories, or salt in your diet.  Drinking too much alcohol. SIGNS AND SYMPTOMS Hypertension does not usually cause signs or symptoms. Extremely high blood pressure (hypertensive crisis) may cause headache, anxiety, shortness of breath, and nosebleed. DIAGNOSIS  To check if you have hypertension, your health care provider will measure your blood pressure while you are seated, with your arm held at the level of your heart. It should be measured at least twice using the same arm. Certain conditions can cause a difference in blood pressure between your right and left arms. A blood pressure reading that is higher than normal on one occasion does not mean that you need treatment. If one blood pressure reading is high, ask your health care provider about having it checked again. TREATMENT  Treating high blood pressure includes making lifestyle changes and possibly taking medicine. Living a healthy lifestyle can help lower high blood pressure. You may need to change some of your habits. Lifestyle changes may include:  Following the DASH diet. This diet is high in fruits, vegetables, and whole grains. It is low in salt, red meat, and added sugars.  Getting at least 2 hours  of brisk physical activity every week.  Losing weight if necessary.  Not smoking.  Limiting alcoholic beverages.  Learning ways to reduce stress. If lifestyle changes are not enough to get your blood pressure under control, your health care provider may prescribe medicine. You may need to take more than one. Work closely with your health care provider to understand the risks and benefits. HOME CARE INSTRUCTIONS  Have your blood pressure rechecked as directed by your health care provider.   Take medicines only as directed by your health care provider. Follow the directions carefully. Blood pressure medicines must be taken as prescribed. The medicine does not work as well when you skip doses. Skipping doses also puts you at risk for problems.   Do not smoke.   Monitor your blood pressure at home as directed by your health care provider. SEEK MEDICAL CARE IF:   You think you are having a reaction to medicines taken.  You have recurrent headaches or feel dizzy.  You have swelling in your ankles.  You have trouble with your vision. SEEK IMMEDIATE MEDICAL CARE IF:  You develop a severe headache or confusion.  You have unusual weakness, numbness, or feel faint.  You have severe chest or abdominal pain.  You vomit repeatedly.  You have trouble breathing. MAKE SURE YOU:   Understand these instructions.  Will watch your condition.  Will get help right away if you are not doing well or get worse. Document Released:  05/22/2005 Document Revised: 10/06/2013 Document Reviewed: 03/14/2013 [employer] Patient Information 2015 [place], [place]. This information is not intended to replace advice given to you by your health care provider. Make sure you discuss any questions you have with your health care provider.

## 2014-07-22 NOTE — ED Provider Notes (Signed)
CSN: [id number]     Arrival date & time 07/22/14  1414 History   First MD Initiated Contact with Patient 07/22/14 1454     Chief Complaint  Patient presents with  . Hypertension  . Neck Pain     (Consider location/radiation/quality/duration/timing/severity/associated sxs/prior Treatment) Patient is a 81 y.o. female presenting with hypertension. The history is provided by the patient.  Hypertension Pertinent negatives include no abdominal pain and no shortness of breath.  pt indicates hx osteoporosis, was getting her 3rd reclast infusion today, and had onset left facial numbness/tingling sensation, vague sense of dizziness, and transient blurry vision. No vertigo. No syncope. Denies associated cp or palpitations. No extremity numbness/weakness. No change in speech. Visual symptoms lasted a couple minutes and have resolved. Left facial numbness persists/constant. No hx same symptoms. Denies any other recent numbness or weakness. Pt has been ambulatory since symptom onset. No unsteadiness. No loss of normal functional ability. No asa or other anticoag use. No headache. No neck pain. Afeb.   Pt also states was told bp high at infusion center. Denies hx htn. No cp or sob. No headache.     Past Medical History  Diagnosis Date  . Palpitations   . Syncope     History of   . Dizziness   . Hypertension     mild  . Back pain   . Depression   . Stress   . Fatigue   . Arm numbness left   . Skin cancer   . Bronchitis, chronic    Past Surgical History  Procedure Laterality Date  . Appendectomy    . Cesarean section  [phone]   Family History  Problem Relation Age of Onset  . Atrial fibrillation Mother   . Alcohol abuse Father    History  Substance Use Topics  . Smoking status: Never Smoker   . Smokeless tobacco: Never Used  . Alcohol Use: Yes     Comment: social ETOH   OB History    No data available     Review of Systems  Constitutional: Negative for chills.  HENT:  Negative for hearing loss and tinnitus.   Eyes: Negative for pain.  Respiratory: Negative for shortness of breath.   Gastrointestinal: Negative for nausea, vomiting and abdominal pain.  Genitourinary: Negative for dysuria and flank pain.  Musculoskeletal: Negative for back pain.  Skin: Negative for rash.  Hematological: Does not bruise/bleed easily.  Psychiatric/Behavioral: Negative for confusion.      Allergies  Ciprofloxacin; Penicillins; Sulfonamide derivatives; and Chlorpheniramine  Home Medications   Prior to Admission medications   Medication Sig Start Date End Date Taking? Authorizing Provider  acetaminophen (TYLENOL) 325 MG tablet Take 650 mg by mouth every 6 (six) hours as needed.   Yes Historical Provider, MD  aspirin-acetaminophen-caffeine (EXCEDRIN MIGRAINE) [phone] MG per tablet Take 1 tablet by mouth every 6 (six) hours as needed for headache.   Yes Historical Provider, MD  beta carotene w/minerals (OCUVITE) tablet Take 1 tablet by mouth daily.   Yes Historical Provider, MD  BIOTIN MAXIMUM STRENGTH PO Take 1 tablet by mouth daily.   Yes Historical Provider, MD  CALCIUM PO Take 1,500 mg by mouth daily.     Yes Historical Provider, MD  Cholecalciferol (VITAMIN D) 2000 UNITS CAPS Take 1 capsule by mouth daily.    Yes Historical Provider, MD  dextromethorphan-guaiFENesin (MUCINEX DM) 30-600 MG per 12 hr tablet Take 1 tablet by mouth daily as needed for cough.   Yes Historical Provider,  MD  HYDROcodone-acetaminophen (NORCO/VICODIN) 5-325 MG per tablet Take 1 tablet by mouth at bedtime.   Yes Historical Provider, MD  Multiple Vitamin (MULTIVITAMIN) tablet Take 1 tablet by mouth daily.     Yes Historical Provider, MD  Respiratory Therapy Supplies (FLUTTER) DEVI Use as directed Patient taking differently: Take 1 each by mouth every hour. Use as directed 04/27/14  Yes Tanda Rockers, MD  benzonatate (TESSALON) 200 MG capsule One four times daily as needed for cough Patient not  taking: Reported on 07/22/2014 04/27/14   Tanda Rockers, MD  doxycycline (VIBRA-TABS) 100 MG tablet Take 1 tablet (100 mg total) by mouth 2 (two) times daily. Patient not taking: Reported on 07/22/2014 04/27/14   Tanda Rockers, MD  predniSONE (DELTASONE) 10 MG tablet Take  4 each am x 2 days,   2 each am x 2 days,  1 each am x 2 days and stop Patient not taking: Reported on 07/22/2014 04/27/14   Tanda Rockers, MD   BP 185/76 mmHg  Pulse 69  Temp(Src) 97.7 F (36.5 C) (Oral)  Resp 17  SpO2 95% Physical Exam  Constitutional: She is oriented to person, place, and time. She appears well-developed and well-nourished. No distress.  HENT:  Head: Atraumatic.  Mouth/Throat: Oropharynx is clear and moist.  No facial redness, swelling, or rash.   Eyes: Conjunctivae and EOM are normal. Pupils are equal, round, and reactive to light. No scleral icterus.  Neck: Neck supple. No tracheal deviation present.  No bruit  Cardiovascular: Normal rate, regular rhythm, normal heart sounds and intact distal pulses.   Pulmonary/Chest: Effort normal and breath sounds normal. No respiratory distress.  Abdominal: Soft. Normal appearance. She exhibits no distension. There is no tenderness.  Musculoskeletal: She exhibits no edema or tenderness.  Neurological: She is alert and oriented to person, place, and time. No cranial nerve deficit.  No facial weakness. Subjective pressure/light touch grossly intact bil, and bil face, however states subjectively left face feels 'different'. Motor intact bil, stre 5/5. Steady gait.   Skin: Skin is warm and dry. No rash noted. She is not diaphoretic.  Psychiatric: She has a normal mood and affect.  Nursing note and vitals reviewed.   ED Course  Procedures (including critical care time) Labs Review  Results for orders placed or performed during the hospital encounter of 07/22/14  CBC  Result Value Ref Range   WBC 6.8 4.0 - 10.5 K/uL   RBC 4.37 3.87 - 5.11 MIL/uL    Hemoglobin 13.5 12.0 - 15.0 g/dL   HCT 40.6 36.0 - 46.0 %   MCV 92.9 78.0 - 100.0 fL   MCH 30.9 26.0 - 34.0 pg   MCHC 33.3 30.0 - 36.0 g/dL   RDW 13.0 11.5 - 15.5 %   Platelets 200 150 - 400 K/uL  Basic metabolic panel  Result Value Ref Range   Sodium 143 135 - 145 mmol/L   Potassium 3.8 3.5 - 5.1 mmol/L   Chloride 112 96 - 112 mmol/L   CO2 23 19 - 32 mmol/L   Glucose, Bld 77 70 - 99 mg/dL   BUN 20 6 - 23 mg/dL   Creatinine, Ser 0.78 0.50 - 1.10 mg/dL   Calcium 9.2 8.4 - 10.5 mg/dL   GFR calc non Af Amer 77 (L) >90 mL/min   GFR calc Af Amer 89 (L) >90 mL/min   Anion gap 8 5 - 15   Mr Brain Wo Contrast  07/22/2014   CLINICAL  DATA:  81 year old female with left side numbness, weakness, and occipital headaches. Initial encounter.  EXAM: MRI HEAD WITHOUT CONTRAST  TECHNIQUE: Multiplanar, multiecho pulse sequences of the brain and surrounding structures were obtained without intravenous contrast.  COMPARISON:  Brain MRI 01/08/2011, and earlier .  FINDINGS: Cerebral volume is not significantly changed since 2012. Major intracranial vascular flow voids are stable. No restricted diffusion to suggest acute infarction. No midline shift, mass effect, evidence of mass lesion, ventriculomegaly, extra-axial collection or acute intracranial hemorrhage. Cervicomedullary junction and pituitary are within normal limits. Negative visualized cervical spine.  Chronic Patchy and confluent cerebral white matter T2 and FLAIR hyperintensity, mildly progressed since 2012. Configuration remains nonspecific. However, there is evidence of a right basal ganglia chronic lacunar infarct again noted. There are superimposed deep gray matter and bilateral white matter increased perivascular spaces. Brainstem and cerebellum remain normal for age.  Visible internal auditory structures appear normal. Trace mastoid fluid. Negative nasopharynx. Stable paranasal sinuses. Visualized orbit soft tissues are within normal limits.  Visualized scalp soft tissues are within normal limits. Normal bone marrow signal.  IMPRESSION: 1.  No acute intracranial abnormality. 2. Progressed cerebral white matter signal changes since 2012, favor due to chronic small vessel disease in light of chronic right basal ganglia lacunar infarct.   Electronically Signed   By: Genevie Ann M.D.   On: 07/22/2014 17:07       MDM   Iv ns. Labs. Mri.  Reviewed nursing notes and prior charts for additional history.   Recheck pt comfortable. No pain. No focal deficit noted on neuro exam.  Mri neg acute for cva.  Pt currently appears stable for d/c.  Will refer to close pcp f/u for recheck, and recheck of blood pressure.  Pt currently appears stable for d/c.     Mirna Mires, MD 07/22/14 1726

## 2014-07-29 DIAGNOSIS — R030 Elevated blood-pressure reading, without diagnosis of hypertension: Secondary | ICD-10-CM | POA: Diagnosis not present

## 2014-08-05 DIAGNOSIS — G43809 Other migraine, not intractable, without status migrainosus: Secondary | ICD-10-CM | POA: Diagnosis not present

## 2014-08-05 DIAGNOSIS — M4712 Other spondylosis with myelopathy, cervical region: Secondary | ICD-10-CM | POA: Diagnosis not present

## 2014-08-05 DIAGNOSIS — M47816 Spondylosis without myelopathy or radiculopathy, lumbar region: Secondary | ICD-10-CM | POA: Diagnosis not present

## 2014-08-10 ENCOUNTER — Ambulatory Visit (INDEPENDENT_AMBULATORY_CARE_PROVIDER_SITE_OTHER): Payer: Medicare Other | Admitting: Internal Medicine

## 2014-08-10 ENCOUNTER — Encounter: Payer: Self-pay | Admitting: Internal Medicine

## 2014-08-10 VITALS — BP 140/72 | HR 73 | Ht 64.0 in | Wt 138.0 lb

## 2014-08-10 DIAGNOSIS — H7093 Unspecified mastoiditis, bilateral: Secondary | ICD-10-CM | POA: Insufficient documentation

## 2014-08-10 DIAGNOSIS — J479 Bronchiectasis, uncomplicated: Secondary | ICD-10-CM

## 2014-08-10 NOTE — Patient Instructions (Signed)
Any time you are coughing/worse breathing   > prilosec 20 mg Take 30-60 min before first meal of the day and Tagamet 400 mg one at bedtime  If cough use mucinex dm total 1200 every 12 hours > try 600 every 6 hours prn and if that doesn't work then try benzonate 200 mg every 6 hours as needed   Please remember to go to the  x-ray department downstairs for your tests - we will call you with the results when they are available.  If mucus turn nasty try doxycline 100 mg twice daily with glass of water before eating   Use flutter valve first thing in am and as much as possible during the day  Plan on seeing ENT next - use the same group as your audiologist    Please schedule a follow up visit in 6 months but call sooner if needed with cxr on return

## 2014-08-10 NOTE — Assessment & Plan Note (Signed)
See MRI 07/22/14 > referred to ent for "ear fullness bilaterally  She is already established with audiologist at one of the ent office and should contact him for eval/ referral.  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

## 2014-08-10 NOTE — Progress Notes (Signed)
Subjective:    Patient ID: [name], female    DOB: 28-Sep-1932    MRN: [id number]    Primary Provider/Referring Provider: Doyle Askew   Brief patient profile:  81  yowf never smoker with recurrent cough since age 81 with right middle lobe/lingular syndrome with limited bronchiectatic changes on her most recent CT study dated 09/2005    History of Present Illness  April 28, 2009 Acute visit. Pt c/o dry cough worse since September occ yellow.  rec: Levaquin 750 x 5 days  Prednisone 4 each am x 2days, 2x2days, 1x2days and stop  Vicodin 5 one every 4 hours for excess cough or pain  Pneumonia @ ov = last one unless the CDC guidelines change  Flu shot today.   August 18, 2010 ov for bronchiectasis / chronic cough worse x 6 months and LUQ pain worse when lie down present 24 hours per day x year already neg abd CT Scan > mucus is clear assoc with nasal drainage also clear, worse in ams with mild nasal congestion. Sinus ct neg so REC 1) Chlortrimeton 4 mg one at bedtime  2) Tagamet 400mg  at bedtime        03/08/2012 f/u ov/Brynda Heick no longer on h1 and h2 and cough worse indolent /persistent, nightly since sev months esp at hs.  Mucus is white, no sob.  rec Plan A Chlortrimeton 4 mg and Tagamet 400 mg one at bedtime Plan B If cough use mucinex dm total 1200 every 12 hours and add prilosec 20 mg Take 30-60 min before first meal of the day GERD diet      03/20/2013 f/u ov/Eliot Popper re: bronchiectasis Chief Complaint  Patient presents with  . Followup with CXR    Pt reports cough worse x 1 wk- started out with sore throat. Cough is prod with large amounts of yellow to white sputum.    better for about 6 months then  sev months prior to OV   noted indolent onset increased cough qam p stirring up not productive then changed in sept 2014 more productive white then turned yellow with sorethroat x one week     Never took any pepcid or prilosec rec Any time you are coughing > prilosec 20 mg Take  30-60 min before first meal of the dayTagamet 400 mg one at bedtime If cough use mucinex dm total 1200 every 12 hours  If sputum turns nasty > levaquin 750 x 5 days > hurt  in joints esp elbows so stopped it  Bronchiectasis pathophys reviewed   04/27/2014 f/u ov/Jahmeek Shirk re: flare of cough esp in am/ did not follow instructions re mucinex dm or prilosec Chief Complaint  Patient presents with  . Follow-up    Pt c/o increased cough since had flu shot 03/24/14. Cough is esp worse in the am and is prod with minimal clear to yellow sputum.     pred helps a lot in past / intol to levaquin and multiple other abx categories noted  Not limited by breathing from desired activities  Though very sedentary  rec Prevnar 13 today  Any time you are coughing  > prilosec 20 mg Take 30-60 min before first meal of the day and Tagamet 400 mg one at bedtime If cough use mucinex dm total 1200 every 12 hours > try 600 every 6 hours prn and if that doesn't work then try benzonate 200mg  every 6 hours as needed  Please remember to go to the  x-ray department downstairs for your  tests - we will call you with the results when they are available. if mucus turn nasty try doxycline 100 mg twice daily with glass of water before eating  Use flutter valve first thing in am and as much as possible during the day Prednisone 10 mg take  4 each am x 2 days,   2 each am x 2 days,  1 each am x 2 days and stop     08/10/2014 f/u ov/Ysmael Hires re: bronchiectasis  Chief Complaint  Patient presents with  . Follow-up    Pt states that her cough has resolved. She c/o stuffiness in her ears- ? allergies.  has not been using mucinex dm or flutter or needed prn doxy since last ov   Not taking gerd rx either  No hearing loss, earache or obvious sinus complaints    No obvious day to day or daytime variabilty or assoc sob or   cp or chest tightness, subjective wheeze overt sinus or hb symptoms. No unusual exp hx or h/o childhood pna/ asthma or  knowledge of premature birth.  Sleeping ok without nocturnal  or early am exacerbation  of respiratory  c/o's or need for noct saba. Also denies any obvious fluctuation of symptoms with weather or environmental changes or other aggravating or alleviating factors except as outlined above   Current Medications, Allergies, Complete Past Medical History, Past Surgical History, Family History, and Social History were reviewed in [employer] record.  ROS  The following are not active complaints unless bolded sore throat, dysphagia, dental problems, itching, sneezing,  nasal congestion or excess/ purulent secretions, ear ache,   fever, chills, sweats, unintended wt loss, pleuritic or exertional cp, hemoptysis,  orthopnea pnd or leg swelling, presyncope, palpitations, heartburn, abdominal pain, anorexia, nausea, vomiting, diarrhea  or change in bowel or urinary habits, change in stools or urine, dysuria,hematuria,  rash, arthralgias, visual complaints, headache, numbness weakness or ataxia or problems with walking or coordination,  change in mood/affect or memory.        Past Medical History:  BRONCHIECTASIS (ICD-494.0) see CT scan of the chest dated 09/29/2005  - Pneumovax April 28, 2009 (over 81 so last one)  Prevnar given 04/27/2014   - Alpha one Screen sent August 18, 2010 >>>  Neg - Sinus CT August 18, 2010 >>  neg  COUGH, CHRONIC (ICD-786.2)  Left Kidney Cyst  - 04/21/09 [facility] PROCEDURE(S): ULTRASOUND GUIDED AND FLUOROSCOPIC GUIDED LEFT RENAL CYST ASPIRATION AND SCLEROSIS  LUQ Abd pain 2011...............................................................Marland KitchenSchooler  - CT ABD 07/29/10 no etiology            Objective:   Physical Exam    thin pleasant ambulatory white female in no acute distress   wt 137 > 137 April 28, 2009 > August 18, 2010 137 >  137 09/29/2010 > 06/21/12 wt 134> 140 03/20/2013 > 04/27/2014  139> 08/10/2014  138  Ears nl bilaterally , good light  reflexes s viz fluid  HEENT mild turbinate edema. Oropharynx no thrush or excess pnd or cobblestoning. No JVD or cervical adenopathy. Mild accessory muscle hypertrophy. Trachea midline, nl thryroid. Chest was hyperinflated by percussion with diminished breath sounds and moderate increased exp time without wheeze. Hoover sign positive at mid inspiration. Regular rate and rhythm without murmur gallop or rub or increase P2. no edema Abd: no hsm, nl excursion. Ext warm without cyanosis or clubbing.       CXR  04/27/2014 : Chronic postinflammatory changes bilaterally. New vague density in the  left mid lung zone is probably inflammatory as well   MRI 07/22/14 images reviewed: Visible internal auditory structures appear normal. Trace mastoid fluid. Negative nasopharynx. Stable paranasal sinuses. Assessment & Plan:

## 2014-08-10 NOTE — Assessment & Plan Note (Signed)
-   See CT Chest 09/29/05 Stable bronchiectasis in right middle lobe and lingula.  Scattered tree in bud opacities throughout the lungs, right greater than left,  nonspecific. These can be seen with TB, other infectious etiologies,  bronchiectasis, and bronchiolitis, as well as multiple additional etiologies.  A few slightly larger and more discrete pulmonary nodules are seen which are  nonspecific, recommend followup CT in 3 to 4 months - PFT's 06/21/2012 1.67 (91%) ratio 64 and no change,  DLCO 77% - Flutter valve added 04/27/2014   I had an extended discussion with the patient reviewing all relevant studies completed to date and  lasting 15 to 20 minutes of a 25 minute visit on the following ongoing concerns:   No symptoms attributable to bronchiectasis at present   Critical that she not forget the contingencies we discussed at last ov     Each maintenance medication was reviewed in detail including most importantly the difference between maintenance and as needed and under what circumstances the prns are to be used.  Please see instructions for details which were reviewed in writing and the patient given a copy.

## 2014-08-12 DIAGNOSIS — R05 Cough: Secondary | ICD-10-CM | POA: Diagnosis not present

## 2014-09-02 DIAGNOSIS — H4323 Crystalline deposits in vitreous body, bilateral: Secondary | ICD-10-CM | POA: Diagnosis not present

## 2014-12-28 DIAGNOSIS — M5136 Other intervertebral disc degeneration, lumbar region: Secondary | ICD-10-CM | POA: Diagnosis not present

## 2015-01-19 DIAGNOSIS — R1084 Generalized abdominal pain: Secondary | ICD-10-CM | POA: Diagnosis not present

## 2015-01-19 DIAGNOSIS — L298 Other pruritus: Secondary | ICD-10-CM | POA: Diagnosis not present

## 2015-01-19 DIAGNOSIS — Z1389 Encounter for screening for other disorder: Secondary | ICD-10-CM | POA: Diagnosis not present

## 2015-01-19 DIAGNOSIS — K5792 Diverticulitis of intestine, part unspecified, without perforation or abscess without bleeding: Secondary | ICD-10-CM | POA: Diagnosis not present

## 2015-01-19 DIAGNOSIS — R0789 Other chest pain: Secondary | ICD-10-CM | POA: Diagnosis not present

## 2015-02-01 ENCOUNTER — Other Ambulatory Visit: Payer: Self-pay | Admitting: Physician Assistant

## 2015-02-01 DIAGNOSIS — K5900 Constipation, unspecified: Secondary | ICD-10-CM | POA: Diagnosis not present

## 2015-02-01 DIAGNOSIS — R1032 Left lower quadrant pain: Secondary | ICD-10-CM

## 2015-02-04 ENCOUNTER — Encounter: Payer: Medicare Other | Admitting: Cardiovascular Disease

## 2015-02-09 ENCOUNTER — Ambulatory Visit: Payer: Medicare Other | Admitting: Internal Medicine

## 2015-02-12 ENCOUNTER — Ambulatory Visit (INDEPENDENT_AMBULATORY_CARE_PROVIDER_SITE_OTHER): Payer: Medicare Other | Admitting: Internal Medicine

## 2015-02-12 ENCOUNTER — Encounter: Payer: Self-pay | Admitting: Internal Medicine

## 2015-02-12 ENCOUNTER — Ambulatory Visit (INDEPENDENT_AMBULATORY_CARE_PROVIDER_SITE_OTHER)
Admission: RE | Admit: 2015-02-12 | Discharge: 2015-02-12 | Disposition: A | Discharge status: Home / Self Care | Payer: Medicare Other | Source: Ambulatory Visit | Attending: Internal Medicine | Admitting: Internal Medicine

## 2015-02-12 DIAGNOSIS — J479 Bronchiectasis, uncomplicated: Secondary | ICD-10-CM

## 2015-02-12 NOTE — Assessment & Plan Note (Addendum)
-   See CT Chest 09/29/05 Stable bronchiectasis in right middle lobe and lingula.  Scattered tree in bud opacities throughout the lungs, right greater than left,  nonspecific. These can be seen with TB, other infectious etiologies,  bronchiectasis, and bronchiolitis, as well as multiple additional etiologies.  A few slightly larger and more discrete pulmonary nodules are seen which are  nonspecific, recommend followup CT in 3 to 4 months - PFT's 06/21/2012 1.67 (91%) ratio 64 and no change,  DLCO 77% - Flutter valve added 04/27/2014   Adequate control on present rx, reviewed > no change in rx needed    Each maintenance medication was reviewed in detail including most importantly the difference between maintenance and as needed and under what circumstances the prns are to be used.  Please see instructions for details which were reviewed in writing and the patient given a copy.

## 2015-02-12 NOTE — Patient Instructions (Addendum)
No change in plan  Add tagamet 400 mg at bedtime  Please remember to go to the  x-ray department downstairs for your tests - we will call you with the results when they are available.    Please schedule a follow up visit in 3 months but call sooner if needed with pfts

## 2015-02-12 NOTE — Progress Notes (Signed)
Subjective:    Patient ID: [name], female    DOB: 28-Sep-1932    MRN: [id number]    Primary Provider/Referring Provider: Doyle Lane   Brief patient profile:  32  yowf never smoker with recurrent cough since age 81 with right middle lobe/lingular syndrome with limited bronchiectatic changes on her most recent CT study dated 09/2005    History of Present Illness  April 28, 2009 Acute visit. Pt c/o dry cough worse since September occ yellow.  rec: Levaquin 750 x 5 days  Prednisone 4 each am x 2days, 2x2days, 1x2days and stop  Vicodin 5 one every 4 hours for excess cough or pain  Pneumonia @ ov = last one unless the CDC guidelines change  Flu shot today.   August 18, 2010 ov for bronchiectasis / chronic cough worse x 6 months and LUQ pain worse when lie down present 24 hours per day x year already neg abd CT Scan > mucus is clear assoc with nasal drainage also clear, worse in ams with mild nasal congestion. Sinus ct neg so REC 1) Chlortrimeton 4 mg one at bedtime  2) Tagamet 400mg  at bedtime        03/08/2012 f/u ov/[name] no longer on h1 and h2 and cough worse indolent /persistent, nightly since sev months esp at hs.  Mucus is white, no sob.  rec Plan A Chlortrimeton 4 mg and Tagamet 400 mg one at bedtime Plan B If cough use mucinex dm total 1200 every 12 hours and add prilosec 20 mg Take 30-60 min before first meal of the day GERD diet      03/20/2013 f/u ov/[name] re: bronchiectasis Chief Complaint  Patient presents with  . Followup with CXR    Pt reports cough worse x 1 wk- started out with sore throat. Cough is prod with large amounts of yellow to white sputum.    better for about 6 months then  sev months prior to OV   noted indolent onset increased cough qam p stirring up not productive then changed in sept 2014 more productive white then turned yellow with sorethroat x one week     Never took any pepcid or prilosec rec Any time you are coughing > prilosec 20 mg Take  30-60 min before first meal of the dayTagamet 400 mg one at bedtime If cough use mucinex dm total 1200 every 12 hours  If sputum turns nasty > levaquin 750 x 5 days > hurt  in joints esp elbows so stopped it  Bronchiectasis pathophys reviewed   04/27/2014 f/u ov/[name] re: flare of cough esp in am/ did not follow instructions re mucinex dm or prilosec Chief Complaint  Patient presents with  . Follow-up    Pt c/o increased cough since had flu shot 03/24/14. Cough is esp worse in the am and is prod with minimal clear to yellow sputum.     pred helps a lot in past / intol to levaquin and multiple other abx categories noted  Not limited by breathing from desired activities  Though very sedentary  rec Prevnar 13 today  Any time you are coughing  > prilosec 20 mg Take 30-60 min before first meal of the day and Tagamet 400 mg one at bedtime If cough use mucinex dm total 1200 every 12 hours > try 600 every 6 hours prn and if that doesn't work then try benzonate 200mg  every 6 hours as needed  Please remember to go to the  x-ray department downstairs for your  tests - we will call you with the results when they are available. if mucus turn nasty try doxycline 100 mg twice daily with glass of water before eating  Use flutter valve first thing in am and as much as possible during the day Prednisone 10 mg take  4 each am x 2 days,   2 each am x 2 days,  1 each am x 2 days and stop     08/10/2014 f/u ov/[name] re: bronchiectasis  Chief Complaint  Patient presents with  . Follow-up    Pt states that her cough has resolved. She c/o stuffiness in her ears- ? allergies.  has not been using mucinex dm or flutter or needed prn doxy since last ov   Not taking gerd rx either  No hearing loss, ear ache or obvious sinus complaints  rec Any time you are coughing/worse breathing   > prilosec 20 mg Take 30-60 min before first meal of the day and Tagamet 400 mg one at bedtime If cough use mucinex dm total 1200 every  12 hours > try 600 every 6 hours prn and if that doesn't work then try benzonate 200 mg every 6 hours as needed  Please remember to go to the  x-ray department downstairs for your tests - we will call you with the results when they are available If mucus turn nasty try doxycline 100 mg twice daily with glass of water before eating  Use flutter valve first thing in am and as much as possible during the day Plan on seeing ENT next - use the same group as your audiologist > done        02/12/2015 f/u ov/[name] re: bronchiectasis  Chief Complaint  Patient presents with  . 6 month follow up    Nonprod cough - was given prednsione for back problems approx 1 1/2 months ago.  This seemed to help cough.   Hoarseness.       Much better on prednisone for back pain, more cough/ hoarseness off pred but nothing purulent or bloody so hasn't taken doxy  Not limited by breathing from desired activities  .  No obvious day to day or daytime variabilty or assoc sob or   cp or chest tightness, subjective wheeze overt sinus or hb symptoms. No unusual exp hx or h/o childhood pna/ asthma or knowledge of premature birth.  Sleeping ok without nocturnal  or early am exacerbation  of respiratory  c/o's or need for noct saba. Also denies any obvious fluctuation of symptoms with weather or environmental changes or other aggravating or alleviating factors except as outlined above   Current Medications, Allergies, Complete Past Medical History, Past Surgical History, Family History, and Social History were reviewed in [employer] record.  ROS  The following are not active complaints unless bolded sore throat, dysphagia, dental problems, itching, sneezing,  nasal congestion or excess/ purulent secretions, ear ache,   fever, chills, sweats, unintended wt loss, pleuritic or exertional cp, hemoptysis,  orthopnea pnd or leg swelling, presyncope, palpitations, heartburn, abdominal pain, anorexia, nausea,  vomiting, diarrhea  or change in bowel or urinary habits, change in stools or urine, dysuria,hematuria,  rash, arthralgias, visual complaints, headache, numbness weakness or ataxia or problems with walking or coordination,  change in mood/affect or memory.        Past Medical History:  BRONCHIECTASIS (ICD-494.0) see CT scan of the chest dated 09/29/2005  - Pneumovax April 28, 2009 (over 67 so last one)  Dietitian  given 04/27/2014   - Alpha one Screen sent August 18, 2010 >>>  Neg - Sinus CT August 18, 2010 >>  neg  COUGH, CHRONIC (ICD-786.2)  Left Kidney Cyst  - 04/21/09 [facility] PROCEDURE(S): ULTRASOUND GUIDED AND FLUOROSCOPIC GUIDED LEFT RENAL CYST ASPIRATION AND SCLEROSIS  LUQ Abd pain 2011...............................................................Marland KitchenSchooler  - CT ABD 07/29/10 no etiology            Objective:   Physical Exam    thin pleasant ambulatory white female in no acute distress   wt 137 > 137 April 28, 2009 > August 18, 2010 137 >  137 09/29/2010 > 06/21/12 wt 134> 140 03/20/2013 > 04/27/2014  139> 08/10/2014  138 > 02/12/2015 136  Ears nl bilaterally , good light reflexes s viz fluid  HEENT mild turbinate edema. Oropharynx no thrush or excess pnd or cobblestoning. No JVD or cervical adenopathy. Mild accessory muscle hypertrophy. Trachea midline, nl thryroid. Chest was hyperinflated by percussion with diminished breath sounds and moderate increased exp time without wheeze. Hoover sign positive at mid inspiration. Regular rate and rhythm without murmur gallop or rub or increase P2. no edema Abd: no hsm, nl excursion. Ext warm without cyanosis or clubbing.      CXR PA and Lateral:   02/12/2015 :     I personally reviewed images and agree with radiology impression as follows:   Findings consistent with stable changes of granulomas disease and pleural parenchymal scarring.    Assessment & Plan:

## 2015-02-15 ENCOUNTER — Ambulatory Visit
Admission: RE | Admit: 2015-02-15 | Discharge: 2015-02-15 | Disposition: A | Discharge status: Home / Self Care | Payer: Medicare Other | Source: Ambulatory Visit | Attending: Physician Assistant | Admitting: Physician Assistant

## 2015-02-15 DIAGNOSIS — R1032 Left lower quadrant pain: Secondary | ICD-10-CM | POA: Diagnosis not present

## 2015-02-15 DIAGNOSIS — R1012 Left upper quadrant pain: Secondary | ICD-10-CM | POA: Diagnosis not present

## 2015-02-15 MED ORDER — IOPAMIDOL (ISOVUE-300) INJECTION 61%
100.0000 mL | Freq: Once | INTRAVENOUS | Status: AC | PRN
  Administered 2015-02-15: 100 mL via INTRAVENOUS

## 2015-02-15 NOTE — Progress Notes (Signed)
Quick Note:  Spoke with pt and notified of results per Dr. Wert. Pt verbalized understanding and denied any questions.  ______ 

## 2015-02-17 DIAGNOSIS — M47816 Spondylosis without myelopathy or radiculopathy, lumbar region: Secondary | ICD-10-CM | POA: Diagnosis not present

## 2015-03-02 ENCOUNTER — Encounter: Payer: Self-pay | Admitting: Cardiovascular Disease

## 2015-03-02 ENCOUNTER — Ambulatory Visit (INDEPENDENT_AMBULATORY_CARE_PROVIDER_SITE_OTHER): Payer: Medicare Other | Admitting: Cardiovascular Disease

## 2015-03-02 VITALS — BP 142/84 | HR 70 | Ht 64.0 in | Wt 135.4 lb

## 2015-03-02 DIAGNOSIS — R55 Syncope and collapse: Secondary | ICD-10-CM | POA: Diagnosis not present

## 2015-03-02 DIAGNOSIS — I10 Essential (primary) hypertension: Secondary | ICD-10-CM | POA: Diagnosis not present

## 2015-03-02 DIAGNOSIS — R002 Palpitations: Secondary | ICD-10-CM | POA: Diagnosis not present

## 2015-03-02 DIAGNOSIS — R42 Dizziness and giddiness: Secondary | ICD-10-CM

## 2015-03-02 NOTE — Progress Notes (Signed)
[name]  Date of Birth  August 17, 1932 [facility] [address], [place]  [address] [phone]  Fax  [phone]   P roblem list: 1. Palpitations 2. History of syncope 3. Dizziness 4. Mild hypertension  History of Present Illness:  [name] is an elderly female with a history of palpitations and history of dizziness. She has mild hypertension. She's done very well since I last saw her. She has not had any episodes of chest pain or shortness of breath. She has had some leg discomfort particularly on busy days.  Sept. 27, 2016: [name] is seen back for office visit. Has had some lightheadedness on occasion .  Lasts for a few seconds  Occurs at various times, not associated with any particular activity .  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    Current Outpatient Prescriptions on File Prior to Visit  Medication Sig Dispense Refill  . acetaminophen (TYLENOL) 325 MG tablet Take 650 mg by mouth every 6 (six) hours as needed.    Marland Kitchen aspirin-acetaminophen-caffeine (EXCEDRIN MIGRAINE) 250-250-65 MG per tablet Take 1 tablet by mouth every 6 (six) hours as needed for headache.    . beta carotene w/minerals (OCUVITE) tablet Take 1 tablet by mouth daily.    Marland Kitchen BIOTIN MAXIMUM STRENGTH PO Take 1 tablet by mouth daily.    Marland Kitchen CALCIUM PO Take 1,500 mg by mouth daily.      . Cholecalciferol (VITAMIN D) 2000 UNITS CAPS Take 1 capsule by mouth daily.     Marland Kitchen dextromethorphan-guaiFENesin (MUCINEX DM) 30-600 MG per 12 hr tablet Take 1 tablet by mouth daily as needed for cough.    Marland Kitchen HYDROcodone-acetaminophen (NORCO/VICODIN) 5-325 MG per tablet Take 1 tablet by mouth at bedtime.    . Multiple Vitamin (MULTIVITAMIN) tablet Take 1 tablet by mouth daily.      Marland Kitchen Respiratory Therapy Supplies (FLUTTER) DEVI Use as directed 1 each 0   No current facility-administered medications on file prior to visit.    Allergies    Allergen Reactions  . Ciprofloxacin Other (See Comments)    triggered a migraine that was thought to be a TIA  . Penicillins Rash  . Sulfonamide Derivatives Swelling  . Avelox [Moxifloxacin]     Rash and severe tingling/numbness   . Reclast [Zoledronic Acid]     hypertension  . Chlorpheniramine Other (See Comments)    Headache, stomach ache, bladder "not working well"    Past Medical History  Diagnosis Date  . Palpitations   . Syncope     History of   . Dizziness   . Hypertension     mild  . Back pain   . Depression   . Stress   . Fatigue   . Arm numbness left   . Skin cancer   . Bronchitis, chronic     Past Surgical History  Procedure Laterality Date  . Appendectomy    . Cesarean section  [id number]    History  Smoking status  . Never Smoker   Smokeless tobacco  . Never Used    History  Alcohol Use  . Yes    Comment: social ETOH    Family History  Problem Relation Age of Onset  . Atrial fibrillation Mother   . Alcohol abuse Father     Reviw of Systems:  Reviewed in the HPI.  All other systems are negative.  Physical Exam:  BP 142/84 mmHg  Pulse 70  Ht 5\' 4"  (1.626 m)  Wt 61.417 kg (135 lb 6.4 oz)  BMI 23.23 kg/m2 The patient is alert and oriented x 3.  The mood and affect are normal.  The skin is warm and dry.  Color is normal.  The HEENT exam reveals that the sclera are nonicteric.  The mucous membranes are moist.  The carotids are 2+ without bruits.  There is no thyromegaly.  There is no JVD.  The lungs are clear.  The chest wall is non tender.  The heart exam reveals a regular rate with a normal S1 and S2.  There are no murmurs, gallops, or rubs.  The PMI is not displaced.   Abdominal exam reveals good bowel sounds.  There is no guarding or rebound.  There is no hepatosplenomegaly or tenderness.  There are no masses.  Exam of the legs reveal no clubbing, cyanosis, or edema.  The legs are without rashes.  The distal pulses are intact.  Cranial  nerves II - XII are intact.  Motor and sensory functions are intact.  The gait is normal.  ECG:  NSR at 70.  Normal  ECG   Assessment / Plan:   1. Palpitations 2. History of syncope -  she continues to have symptoms of near-syncope. These occur at various  times. They're not associated with any specific activities.  I would like to place an event monitor on her for further evaluation. We will see her in 3 months for further evaluation.  3. Dizziness  4. Mild hypertension - stable     Nahser, Wonda Cheng, MD  03/02/2015 3:03 PM    [facility] [place],  [address] [place] [place], [place]  [address] Pager [phone] Phone: [phone]; Fax: [phone]   [facility]  [address] [place], [place]  [address] [phone]   Fax [phone]

## 2015-03-02 NOTE — Patient Instructions (Signed)
Medication Instructions:  Your physician recommends that you continue on your current medications as directed. Please refer to the Current Medication list given to you today.   Labwork: None Ordered   Testing/Procedures: Your physician has recommended that you wear a holter monitor. Holter monitors are medical devices that record the heart's electrical activity. Doctors most often use these monitors to diagnose arrhythmias. Arrhythmias are problems with the speed or rhythm of the heartbeat. The monitor is a small, portable device. You can wear one while you do your normal daily activities. This is usually used to diagnose what is causing palpitations/syncope (passing out).   Follow-Up: Your physician recommends that you schedule a follow-up appointment in: 3 months with Dr. Acie Fredrickson.

## 2015-03-03 DIAGNOSIS — K5900 Constipation, unspecified: Secondary | ICD-10-CM | POA: Diagnosis not present

## 2015-03-03 DIAGNOSIS — R1012 Left upper quadrant pain: Secondary | ICD-10-CM | POA: Diagnosis not present

## 2015-03-03 DIAGNOSIS — R1032 Left lower quadrant pain: Secondary | ICD-10-CM | POA: Diagnosis not present

## 2015-03-17 DIAGNOSIS — M5136 Other intervertebral disc degeneration, lumbar region: Secondary | ICD-10-CM | POA: Diagnosis not present

## 2015-03-29 ENCOUNTER — Ambulatory Visit (INDEPENDENT_AMBULATORY_CARE_PROVIDER_SITE_OTHER): Payer: Medicare Other

## 2015-03-29 DIAGNOSIS — R002 Palpitations: Secondary | ICD-10-CM | POA: Diagnosis not present

## 2015-03-29 DIAGNOSIS — R55 Syncope and collapse: Secondary | ICD-10-CM | POA: Diagnosis not present

## 2015-03-30 ENCOUNTER — Telehealth: Payer: Self-pay | Admitting: Cardiovascular Disease

## 2015-03-30 NOTE — Telephone Encounter (Signed)
Pt calling to report to Dr Acie Fredrickson that she took her event monitor off today because she thinks this is causing her "head cold to worsen." Pt states she does not want to wear this for the full 30 days, for she can't tolerate it.  Pt states it causes her a "headache." Pt states that she will put it back on tomorrow, but she needed "a break from it." Pt has no cardiac complaints at this time. Pt has no skin irritation from the monitor.  Informed the pt that the monitor more than likely is not causing her "head cold and headache to worsen." Encouraged the pt to put the monitor back on and wear this as long as she possibly can, for this will provide Dr Acie Fredrickson with pertinent data on any kind of heart rhythm that she could possibly be in.  Informed the pt that given her complaints of palpitations and dizziness, the monitor will be able to trace any rhythms that correspond with these symptoms. Informed the pt that if she could at least tolerate having the monitor for 1/2 the duration, that's better than no data at all.  Encouraged the pt to put the monitor back on tomorrow, and rest for the evening.  Informed the pt that I will route this message to Dr Acie Fredrickson for further review and any other recommendations, and follow-up with the pt thereafter. Pt verbalized understanding and agrees with this plan.

## 2015-03-30 NOTE — Telephone Encounter (Signed)
New message   1. Is this related to a heart monitor you are wearing? Yes  2. What is your issue?? Pt has taken the monitor off and she doesn't want to wear it. It is making her sick to her stomach and giving her a headache.   She just wants to know what to do with it. Please call

## 2015-03-31 NOTE — Telephone Encounter (Signed)
Agree with Rita Olive, RN that the event monitor is not the cause of her head cold or headache. She should talk to her medical doctor

## 2015-04-22 DIAGNOSIS — Z23 Encounter for immunization: Secondary | ICD-10-CM | POA: Diagnosis not present

## 2015-05-04 ENCOUNTER — Telehealth: Payer: Self-pay | Admitting: Cardiovascular Disease

## 2015-05-04 DIAGNOSIS — R002 Palpitations: Secondary | ICD-10-CM

## 2015-05-04 NOTE — Telephone Encounter (Signed)
New Message  Pt returning Rn phone call- stated that she preferred to hear back before 4pm- pt has appt at that time. Please call back and discuss.

## 2015-05-04 NOTE — Telephone Encounter (Signed)
Spoke with patient who states she received monitor results from Lenice Llamas, Therapist, sports.  She states she only wore the monitor for 2 days because it interfered with her daily activities; states she still works full time.  She states she continues to have multiple episodes of palpitations daily.  She states at times she feels like she might pass out.  She would like to know if there is additional testing that Dr. Acie Fredrickson could order that would not involve wearing the monitor for 30 days.  I advised her that I would discuss with him and call her back. Per Dr. Acie Fredrickson I will order lab work (TSH, BMET) and schedule follow-up with him a few days after.  I spoke with patient and scheduled her for lab appointment and office visit for next week.  She verbalized understanding and agreement with plan of care.

## 2015-05-11 ENCOUNTER — Other Ambulatory Visit (INDEPENDENT_AMBULATORY_CARE_PROVIDER_SITE_OTHER): Payer: Medicare Other | Admitting: *Deleted

## 2015-05-11 DIAGNOSIS — R002 Palpitations: Secondary | ICD-10-CM | POA: Diagnosis not present

## 2015-05-11 LAB — TSH: TSH: 1.432 u[IU]/mL (ref 0.350–4.500)

## 2015-05-11 LAB — BASIC METABOLIC PANEL
BUN: 19 mg/dL (ref 7–25)
CO2: 27 mmol/L (ref 20–31)
Calcium: 8.9 mg/dL (ref 8.6–10.4)
Chloride: 106 mmol/L (ref 98–110)
Creat: 0.84 mg/dL (ref 0.60–0.88)
GLUCOSE: 79 mg/dL (ref 65–99)
Potassium: 4.2 mmol/L (ref 3.5–5.3)
SODIUM: 140 mmol/L (ref 135–146)

## 2015-05-13 ENCOUNTER — Ambulatory Visit: Payer: Medicare Other | Admitting: Cardiovascular Disease

## 2015-05-13 ENCOUNTER — Telehealth: Payer: Self-pay | Admitting: Cardiovascular Disease

## 2015-05-13 DIAGNOSIS — R300 Dysuria: Secondary | ICD-10-CM | POA: Diagnosis not present

## 2015-05-13 DIAGNOSIS — N390 Urinary tract infection, site not specified: Secondary | ICD-10-CM | POA: Diagnosis not present

## 2015-05-13 NOTE — Telephone Encounter (Signed)
Spoke with patient who states someone from our office called her earlier today to report lab results.  She states she was informed of those results on Tuesday and did not know why she was getting another call.  I advised her that I did not know either and that it was not me that called her.  She was scheduled to see Dr. Acie Fredrickson today and cancelled due to a UTI.  She is rescheduled for January and I advised her to call back if she has questions or concerns prior to appointment.  She verbalized understanding and agreement.

## 2015-05-13 NOTE — Telephone Encounter (Signed)
New message  ° ° °Patient calling back to speak with nurse  °

## 2015-05-19 ENCOUNTER — Ambulatory Visit: Payer: Medicare Other | Admitting: Internal Medicine

## 2015-05-24 ENCOUNTER — Ambulatory Visit (INDEPENDENT_AMBULATORY_CARE_PROVIDER_SITE_OTHER): Payer: Medicare Other | Admitting: Internal Medicine

## 2015-05-24 DIAGNOSIS — J479 Bronchiectasis, uncomplicated: Secondary | ICD-10-CM | POA: Diagnosis not present

## 2015-05-24 LAB — PULMONARY FUNCTION TEST
DL/VA % pred: 88 %
DL/VA: 4.23 ml/min/mmHg/L
DLCO unc % pred: 62 %
DLCO unc: 15.11 ml/min/mmHg
FEF 25-75 POST: 0.95 L/s
FEF 25-75 Pre: 0.81 L/sec
FEF2575-%CHANGE-POST: 16 %
FEF2575-%PRED-PRE: 59 %
FEF2575-%Pred-Post: 69 %
FEV1-%Change-Post: 6 %
FEV1-%Pred-Post: 81 %
FEV1-%Pred-Pre: 76 %
FEV1-PRE: 1.46 L
FEV1-Post: 1.55 L
FEV1FVC-%CHANGE-POST: 4 %
FEV1FVC-%PRED-PRE: 89 %
FEV6-%Change-Post: 2 %
FEV6-%Pred-Post: 92 %
FEV6-%Pred-Pre: 89 %
FEV6-Post: 2.24 L
FEV6-Pre: 2.19 L
FEV6FVC-%Change-Post: 0 %
FEV6FVC-%Pred-Post: 104 %
FEV6FVC-%Pred-Pre: 104 %
FVC-%CHANGE-POST: 2 %
FVC-%PRED-PRE: 85 %
FVC-%Pred-Post: 87 %
FVC-POST: 2.25 L
FVC-PRE: 2.21 L
PRE FEV1/FVC RATIO: 66 %
Post FEV1/FVC ratio: 69 %
Post FEV6/FVC ratio: 100 %
Pre FEV6/FVC Ratio: 99 %
RV % pred: 98 %
RV: 2.36 L
TLC % pred: 91 %
TLC: 4.6 L

## 2015-05-24 NOTE — Progress Notes (Signed)
PFT done today. 

## 2015-05-26 ENCOUNTER — Ambulatory Visit: Payer: Medicare Other | Admitting: Internal Medicine

## 2015-05-27 ENCOUNTER — Ambulatory Visit (INDEPENDENT_AMBULATORY_CARE_PROVIDER_SITE_OTHER): Payer: Medicare Other | Admitting: Internal Medicine

## 2015-05-27 ENCOUNTER — Encounter: Payer: Self-pay | Admitting: Internal Medicine

## 2015-05-27 VITALS — BP 128/68 | HR 67 | Ht 64.0 in | Wt 138.6 lb

## 2015-05-27 DIAGNOSIS — J479 Bronchiectasis, uncomplicated: Secondary | ICD-10-CM

## 2015-05-27 NOTE — Progress Notes (Signed)
Subjective:    Patient ID: [name], female    DOB: 28-Sep-1932    MRN: [id number]    Primary Provider/Referring Provider: Doyle Askew   Brief patient profile:  81  yowf never smoker with recurrent cough since age 81 with right middle lobe/lingular syndrome with limited bronchiectatic changes on her most recent CT study dated 09/2005    History of Present Illness  April 28, 2009 Acute visit. Pt c/o dry cough worse since September occ yellow.  rec: Levaquin 750 x 5 days  Prednisone 4 each am x 2days, 2x2days, 1x2days and stop  Vicodin 5 one every 4 hours for excess cough or pain  Pneumonia @ ov = last one unless the CDC guidelines change  Flu shot today.   August 18, 2010 ov for bronchiectasis / chronic cough worse x 6 months and LUQ pain worse when lie down present 24 hours per day x year already neg abd CT Scan > mucus is clear assoc with nasal drainage also clear, worse in ams with mild nasal congestion. Sinus ct neg so REC 1) Chlortrimeton 4 mg one at bedtime  2) Tagamet 400mg  at bedtime        03/08/2012 f/u ov/Wert no longer on h1 and h2 and cough worse indolent /persistent, nightly since sev months esp at hs.  Mucus is white, no sob.  rec Plan A Chlortrimeton 4 mg and Tagamet 400 mg one at bedtime Plan B If cough use mucinex dm total 1200 every 12 hours and add prilosec 20 mg Take 30-60 min before first meal of the day GERD diet      03/20/2013 f/u ov/Wert re: bronchiectasis Chief Complaint  Patient presents with  . Followup with CXR    Pt reports cough worse x 1 wk- started out with sore throat. Cough is prod with large amounts of yellow to white sputum.    better for about 6 months then  sev months prior to OV   noted indolent onset increased cough qam p stirring up not productive then changed in sept 2014 more productive white then turned yellow with sorethroat x one week     Never took any pepcid or prilosec rec Any time you are coughing > prilosec 20 mg Take  30-60 min before first meal of the dayTagamet 400 mg one at bedtime If cough use mucinex dm total 1200 every 12 hours  If sputum turns nasty > levaquin 750 x 5 days > hurt  in joints esp elbows so stopped it  Bronchiectasis pathophys reviewed   04/27/2014 f/u ov/Wert re: flare of cough esp in am/ did not follow instructions re mucinex dm or prilosec Chief Complaint  Patient presents with  . Follow-up    Pt c/o increased cough since had flu shot 03/24/14. Cough is esp worse in the am and is prod with minimal clear to yellow sputum.     pred helps a lot in past / intol to levaquin and multiple other abx categories noted  Not limited by breathing from desired activities  Though very sedentary  rec Prevnar 13 today  Any time you are coughing  > prilosec 20 mg Take 30-60 min before first meal of the day and Tagamet 400 mg one at bedtime If cough use mucinex dm total 1200 every 12 hours > try 600 every 6 hours prn and if that doesn't work then try benzonate 200mg  every 6 hours as needed  Please remember to go to the  x-ray department downstairs for your  tests - we will call you with the results when they are available. if mucus turn nasty try doxycline 100 mg twice daily with glass of water before eating  Use flutter valve first thing in am and as much as possible during the day Prednisone 10 mg take  4 each am x 2 days,   2 each am x 2 days,  1 each am x 2 days and stop     08/10/2014 f/u ov/Wert re: bronchiectasis  Chief Complaint  Patient presents with  . Follow-up    Pt states that her cough has resolved. She c/o stuffiness in her ears- ? allergies.  has not been using mucinex dm or flutter or needed prn doxy since last ov   Not taking gerd rx either  No hearing loss, ear ache or obvious sinus complaints  rec Any time you are coughing/worse breathing   > prilosec 20 mg Take 30-60 min before first meal of the day and Tagamet 400 mg one at bedtime If cough use mucinex dm total 1200 every  12 hours > try 600 every 6 hours prn and if that doesn't work then try benzonate 200 mg every 6 hours as needed  Please remember to go to the  x-ray department downstairs for your tests - we will call you with the results when they are available If mucus turn nasty try doxycline 100 mg twice daily with glass of water before eating  Use flutter valve first thing in am and as much as possible during the day Plan on seeing ENT next - use the same group as your audiologist > done        02/12/2015 f/u ov/Wert re: bronchiectasis  Chief Complaint  Patient presents with  . 6 month follow up    Nonprod cough - was given prednsione for back problems approx 1 1/2 months ago.  This seemed to help cough.   Hoarseness.    Much better on prednisone for back pain, more cough/ hoarseness off pred but nothing purulent or bloody so hasn't taken doxy yet  rec No change in plan Add tagamet 400 mg at bedtime    05/27/2015  f/u ov/Wert re: bronchiectasis  Chief Complaint  Patient presents with  . Follow-up    PFT results. pt. states breathing is baseline. dry cough. denies wheezing or chest pain/tightness.  doe x one flight / flat surface is ok  Had one episode uti rx macrodantin one week only and no worse cough or sob     No obvious day to day or daytime variabilty or assoc sob or excess/ purulent sputum or mucus plugs  or   cp or chest tightness, subjective wheeze overt sinus or hb symptoms. No unusual exp hx or h/o childhood pna/ asthma or knowledge of premature birth.  Sleeping ok without nocturnal  or early am exacerbation  of respiratory  c/o's or need for noct saba. Also denies any obvious fluctuation of symptoms with weather or environmental changes or other aggravating or alleviating factors except as outlined above   Current Medications, Allergies, Complete Past Medical History, Past Surgical History, Family History, and Social History were reviewed in [employer]  record.  ROS  The following are not active complaints unless bolded sore throat, dysphagia, dental problems, itching, sneezing,  nasal congestion or excess/ purulent secretions, ear ache,   fever, chills, sweats, unintended wt loss, pleuritic or exertional cp, hemoptysis,  orthopnea pnd or leg swelling, presyncope, palpitations, heartburn, abdominal pain, anorexia,  nausea, vomiting, diarrhea  or change in bowel or urinary habits, change in stools or urine, dysuria,hematuria,  rash, arthralgias, visual complaints, headache, numbness weakness or ataxia or problems with walking or coordination,  change in mood/affect or memory.        Past Medical History:  BRONCHIECTASIS (ICD-494.0) see CT scan of the chest dated 09/29/2005  - Pneumovax April 28, 2009 (over 65 so last one)  Prevnar given 04/27/2014   - Alpha one Screen sent August 18, 2010 >>>  Neg - Sinus CT August 18, 2010 >>  neg  COUGH, CHRONIC (ICD-786.2)  Left Kidney Cyst  - 04/21/09 [facility] PROCEDURE(S): ULTRASOUND GUIDED AND FLUOROSCOPIC GUIDED LEFT RENAL CYST ASPIRATION AND SCLEROSIS  LUQ Abd pain 2011...............................................................Marland KitchenSchooler  - CT ABD 07/29/10 no etiology            Objective:   Physical Exam    thin pleasant ambulatory white female in no acute distress   wt 137 > 137 April 28, 2009 > August 18, 2010 137 >  137 09/29/2010 > 06/21/12 wt 134> 140 03/20/2013 > 04/27/2014  139> 08/10/2014  138 > 02/12/2015 136 >05/27/2015  139    HEENT mild turbinate edema. Oropharynx no thrush or excess pnd or cobblestoning. No JVD or cervical adenopathy. Mild accessory muscle hypertrophy. Trachea midline, nl thryroid. Chest was hyperinflated by percussion with diminished breath sounds and moderate increased exp time without wheeze. Hoover sign positive at mid inspiration. Regular rate and rhythm without murmur gallop or rub or increase P2. no edema Abd: no hsm, nl excursion. Ext warm without cyanosis or  clubbing.      CXR PA and Lateral:   02/12/2015 :     I personally reviewed images and agree with radiology impression as follows:   Findings consistent with stable changes of granulomas disease and pleural parenchymal scarring.    Assessment & Plan:

## 2015-05-27 NOTE — Patient Instructions (Signed)
Please schedule a follow up visit in 6 months but call sooner if needed  

## 2015-05-29 ENCOUNTER — Encounter: Payer: Self-pay | Admitting: Internal Medicine

## 2015-05-29 NOTE — Assessment & Plan Note (Signed)
-   See CT Chest 09/29/05 Stable bronchiectasis in right middle lobe and lingula.  Scattered tree in bud opacities throughout the lungs, right greater than left,  nonspecific.   - PFT's 06/21/2012 1.67 (91%) ratio 64 and no change,  DLCO 77% - Flutter valve added 04/27/2014  - PFTs 05/24/15   FEV1 1.55 (81%) ratio 69 p 6% improvement from saba and dlco 62% ratio 88%   I had an extended discussion with the patient reviewing all relevant studies completed to date and  lasting 15 to 20 minutes of a 25 minute visit on the following ongoing concerns:   1) her disease is moderate and well controlled on her present regimen which does not include bronchodilators nor are they needed at this point. Note that the degree of airflow obstruction is actually less now than it was on her previous study  2)  she needs to remember to use maximum doses of Mucinex and flutter valve whenever she starts coughing and then keep the doxycycline for when the sputum is purulent.  3) I would prefer that she avoid Macrodantin both now and in the future and simply use the doxycycline if she has recurrent symptomatic UTI as a first line agent   4) overall her prognosis is excellent despite the ct findings  there is no radiographic or clinical evidence to suggest progressive disease or opportunistic infection by either gram-negative rods or atypical mycobacteria although she is at risk of both     5) Each maintenance medication was reviewed in detail including most importantly the difference between maintenance and as needed and under what circumstances the prns are to be used. This was done in the context of a medication calendar review which provided the patient with a user-friendly unambiguous mechanism for medication administration and reconciliation and provides an action plan for all active problems. It is critical that this be shown to every doctor  for modification during the office visit if necessary so the patient can use it  as a working document.

## 2015-06-10 DIAGNOSIS — Z1231 Encounter for screening mammogram for malignant neoplasm of breast: Secondary | ICD-10-CM | POA: Diagnosis not present

## 2015-06-23 ENCOUNTER — Ambulatory Visit: Payer: Medicare Other | Admitting: Cardiovascular Disease

## 2015-07-07 ENCOUNTER — Encounter: Payer: Self-pay | Admitting: Cardiovascular Disease

## 2015-07-07 ENCOUNTER — Ambulatory Visit (INDEPENDENT_AMBULATORY_CARE_PROVIDER_SITE_OTHER): Payer: Medicare Other | Admitting: Cardiovascular Disease

## 2015-07-07 VITALS — BP 130/80 | HR 80 | Ht 64.0 in | Wt 138.4 lb

## 2015-07-07 DIAGNOSIS — I10 Essential (primary) hypertension: Secondary | ICD-10-CM | POA: Diagnosis not present

## 2015-07-07 DIAGNOSIS — R55 Syncope and collapse: Secondary | ICD-10-CM | POA: Diagnosis not present

## 2015-07-07 NOTE — Progress Notes (Signed)
[name]  Date of Birth  1932-06-09 [facility] [id number] [place], [place]  [address] [phone]  Fax  [phone]   P roblem list: 1. Palpitations 2. History of syncope 3. Dizziness 4. Mild hypertension  History of Present Illness:  [name] is an elderly female with a history of palpitations and history of dizziness. She has mild hypertension. She's done very well since I last saw her. She has not had any episodes of chest pain or shortness of breath. She has had some leg discomfort particularly on busy days.  Sept. 27, 2016: [name] is seen back for office visit. Has had some lightheadedness on occasion .  Lasts for a few seconds  Occurs at various times, not associated with any particular activity .  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   Feb. 1, 2017 Has been under lots of stress,  Husband had neck surgery and it was complicated by some paralysis  Has been running back and forth to [facility] (Dr. Carloyn Manner did the surgery)    Current Outpatient Prescriptions on File Prior to Visit  Medication Sig Dispense Refill  . acetaminophen (TYLENOL) 325 MG tablet Take 650 mg by mouth every 6 (six) hours as needed.    Marland Kitchen aspirin-acetaminophen-caffeine (EXCEDRIN MIGRAINE) 250-250-65 MG per tablet Take 1 tablet by mouth every 6 (six) hours as needed for headache.    . beta carotene w/minerals (OCUVITE) tablet Take 1 tablet by mouth daily.    Marland Kitchen BIOTIN MAXIMUM STRENGTH PO Take 1 tablet by mouth daily.    Marland Kitchen CALCIUM PO Take 1,500 mg by mouth daily.      . Cholecalciferol (VITAMIN D) 2000 UNITS CAPS Take 1 capsule by mouth daily.     Marland Kitchen dextromethorphan-guaiFENesin (MUCINEX DM) 30-600 MG per 12 hr tablet Take 1 tablet by mouth daily as needed for cough.    Marland Kitchen HYDROcodone-acetaminophen (NORCO/VICODIN) 5-325 MG per tablet Take 1 tablet by mouth at bedtime.    . Multiple Vitamin (MULTIVITAMIN) tablet  Take 1 tablet by mouth daily.      Marland Kitchen Respiratory Therapy Supplies (FLUTTER) DEVI Use as directed 1 each 0   No current facility-administered medications on file prior to visit.    Allergies  Allergen Reactions  . Ciprofloxacin Other (See Comments)    triggered a migraine that was thought to be a TIA  . Penicillins Rash  . Sulfonamide Derivatives Swelling  . Avelox [Moxifloxacin]     Rash and severe tingling/numbness   . Reclast [Zoledronic Acid]     hypertension  . Chlorpheniramine Other (See Comments)    Headache, stomach ache, bladder "not working well"    Past Medical History  Diagnosis Date  . Palpitations   . Syncope     History of   . Dizziness   . Hypertension     mild  . Back pain   . Depression   . Stress   . Fatigue   . Arm numbness left   . Skin cancer   . Bronchitis, chronic (HCC)     Past Surgical History  Procedure Laterality Date  . Appendectomy    . Cesarean section  [phone]    History  Smoking status  . Never Smoker   Smokeless tobacco  . Never Used    History  Alcohol Use  . Yes    Comment: social ETOH    Family History  Problem Relation Age of Onset  . Atrial fibrillation Mother   . Alcohol abuse Father     Reviw of Systems:  Reviewed in the HPI.  All other systems are negative.  Physical Exam: BP 130/80 mmHg  Pulse 80  Ht 5\' 4"  (1.626 m)  Wt 138 lb 6.4 oz (62.778 kg)  BMI 23.74 kg/m2 The patient is alert and oriented x 3.  The mood and affect are normal.  The skin is warm and dry.  Color is normal.  The HEENT exam reveals that the sclera are nonicteric.  The mucous membranes are moist.  The carotids are 2+ without bruits.  There is no thyromegaly.  There is no JVD.  The lungs are clear.  The chest wall is non tender.  The heart exam reveals a regular rate with a normal S1 and S2.  There are no murmurs, gallops, or rubs.  The PMI is not displaced.   Abdominal exam reveals good bowel sounds.  There is no guarding or  rebound.  There is no hepatosplenomegaly or tenderness.  There are no masses.  Exam of the legs reveal no clubbing, cyanosis, or edema.  The legs are without rashes.  The distal pulses are intact.  Cranial nerves II - XII are intact.  Motor and sensory functions are intact.  The gait is normal.  ECG:  NSR at 70.  Normal  ECG   Assessment / Plan:   1. Palpitations 2. History of syncope -  she continues to have symptoms of near-syncope. These occur at various  times.  We have given her a 30 day monitor but she was not able to wear it very long  - perhaps 3-4 days. If her presyncope worsens, she should call our office   3. Dizziness  4. Mild hypertension - stable , BP was initially elevated but when we rechecked it, her BP was normal    Cirilo Canner, Wonda Cheng, MD  07/07/2015 4:53 PM    [facility] [place],  [place], [place]  [address] Pager [phone] Phone: [phone]; Fax: [phone]   [facility]  [address] [place], [place]  [address] [phone]   Fax [phone]

## 2015-07-07 NOTE — Patient Instructions (Signed)
Medication Instructions:  Your physician recommends that you continue on your current medications as directed. Please refer to the Current Medication list given to you today.   Labwork: None Ordered   Testing/Procedures: None Ordered   Follow-Up: Your physician wants you to follow-up in: 6 months with Dr. Nahser.  You will receive a reminder letter in the mail two months in advance. If you don't receive a letter, please call our office to schedule the follow-up appointment.   If you need a refill on your cardiac medications before your next appointment, please call your pharmacy.   Thank you for choosing [facility]! Natasja Niday, RN [phone]    

## 2015-07-08 ENCOUNTER — Telehealth: Payer: Self-pay | Admitting: Internal Medicine

## 2015-07-08 NOTE — Telephone Encounter (Signed)
Really needs to be seen as doesn't sound like can be dx'd accurately over the phone

## 2015-07-08 NOTE — Telephone Encounter (Signed)
Patient says that she has pain in the roof of her mouth, gums, back of throat.  Coughing a lot, taking mucinex DM.  Sinuses are very congested, lot of pressure.  She called her PCP and they asked her to come in to be seen, but she does not want to go anywhere, she says that her head is hurting.  She asks that we call her back on her cell phone.   Allergies  Allergen Reactions  . Ciprofloxacin Other (See Comments)    triggered a migraine that was thought to be a TIA  . Penicillins Rash  . Sulfonamide Derivatives Swelling  . Avelox [Moxifloxacin]     Rash and severe tingling/numbness   . Reclast [Zoledronic Acid]     hypertension  . Chlorpheniramine Other (See Comments)    Headache, stomach ache, bladder "not working well"   Pharmacy: [facility]  Patient Instructions     Please schedule a follow up visit in 6 months but call sooner if needed

## 2015-07-08 NOTE — Telephone Encounter (Signed)
Spoke with patient, aware of appt needed.  Pt states that she does not have transportation and her husband is currently in the hospital. Unable to make an appt at this time and states that she will call back tomorrow morning if she is able to find a ride here. Will send to Dr Melvyn Novas as Juluis Rainier.

## 2015-09-06 DIAGNOSIS — H2513 Age-related nuclear cataract, bilateral: Secondary | ICD-10-CM | POA: Diagnosis not present

## 2015-10-15 DIAGNOSIS — R1032 Left lower quadrant pain: Secondary | ICD-10-CM | POA: Diagnosis not present

## 2015-10-15 DIAGNOSIS — R079 Chest pain, unspecified: Secondary | ICD-10-CM | POA: Diagnosis not present

## 2015-10-15 DIAGNOSIS — R350 Frequency of micturition: Secondary | ICD-10-CM | POA: Diagnosis not present

## 2015-10-15 DIAGNOSIS — R5383 Other fatigue: Secondary | ICD-10-CM | POA: Diagnosis not present

## 2015-10-15 DIAGNOSIS — K5909 Other constipation: Secondary | ICD-10-CM | POA: Diagnosis not present

## 2015-10-21 DIAGNOSIS — Z658 Other specified problems related to psychosocial circumstances: Secondary | ICD-10-CM | POA: Diagnosis not present

## 2015-10-21 DIAGNOSIS — R1032 Left lower quadrant pain: Secondary | ICD-10-CM | POA: Diagnosis not present

## 2015-11-24 ENCOUNTER — Ambulatory Visit (INDEPENDENT_AMBULATORY_CARE_PROVIDER_SITE_OTHER): Payer: Medicare Other | Admitting: Internal Medicine

## 2015-11-24 ENCOUNTER — Encounter: Payer: Self-pay | Admitting: Internal Medicine

## 2015-11-24 ENCOUNTER — Ambulatory Visit: Payer: Medicare Other | Admitting: Internal Medicine

## 2015-11-24 VITALS — BP 124/78 | HR 89 | Ht 64.0 in | Wt 133.0 lb

## 2015-11-24 DIAGNOSIS — J471 Bronchiectasis with (acute) exacerbation: Secondary | ICD-10-CM

## 2015-11-24 DIAGNOSIS — J479 Bronchiectasis, uncomplicated: Secondary | ICD-10-CM

## 2015-11-24 DIAGNOSIS — R058 Other specified cough: Secondary | ICD-10-CM

## 2015-11-24 DIAGNOSIS — R05 Cough: Secondary | ICD-10-CM

## 2015-11-24 NOTE — Progress Notes (Signed)
Subjective:    Patient ID: [name], female    DOB: January 05, 1933    MRN: [id number]    Primary Provider/Referring Provider: Doyle Askew   Brief patient profile:  82  yowf never smoker with recurrent cough since age 82 with right middle lobe/lingular syndrome with limited bronchiectatic changes on  CT study dated 09/2005    History of Present Illness  April 28, 2009 Acute visit. Pt c/o dry cough worse since September occ yellow.  rec: Levaquin 750 x 5 days  Prednisone 4 each am x 2days, 2x2days, 1x2days and stop  Vicodin 5 one every 4 hours for excess cough or pain  Pneumonia @ ov = last one unless the CDC guidelines change  Flu shot today.   August 18, 2010 ov for bronchiectasis / chronic cough worse x 6 months and LUQ pain worse when lie down present 24 hours per day x year already neg abd CT Scan > mucus is clear assoc with nasal drainage also clear, worse in ams with mild nasal congestion. Sinus ct neg so REC 1) Chlortrimeton 4 mg one at bedtime  2) Tagamet 400mg  at bedtime        03/08/2012 f/u ov/Wert no longer on h1 and h2 and cough worse indolent /persistent, nightly since sev months esp at hs.  Mucus is white, no sob.  rec Plan A Chlortrimeton 4 mg and Tagamet 400 mg one at bedtime Plan B If cough use mucinex dm total 1200 every 12 hours and add prilosec 20 mg Take 30-60 min before first meal of the day GERD diet      03/20/2013 f/u ov/Wert re: bronchiectasis Chief Complaint  Patient presents with  . Followup with CXR    Pt reports cough worse x 1 wk- started out with sore throat. Cough is prod with large amounts of yellow to white sputum.    better for about 6 months then  sev months prior to OV   noted indolent onset increased cough qam p stirring up not productive then changed in sept 2014 more productive white then turned yellow with sorethroat x one week     Never took any pepcid or prilosec rec Any time you are coughing > prilosec 20 mg Take 30-60 min before  first meal of the dayTagamet 400 mg one at bedtime If cough use mucinex dm total 1200 every 12 hours  If sputum turns nasty > levaquin 750 x 5 days > hurt  in joints esp elbows so stopped it  Bronchiectasis pathophys reviewed   04/27/2014 f/u ov/Wert re: flare of cough esp in am/ did not follow instructions re mucinex dm or prilosec Chief Complaint  Patient presents with  . Follow-up    Pt c/o increased cough since had flu shot 03/24/14. Cough is esp worse in the am and is prod with minimal clear to yellow sputum.     pred helps a lot in past / intol to levaquin and multiple other abx categories noted  Not limited by breathing from desired activities  Though very sedentary  rec Prevnar 13 today  Any time you are coughing  > prilosec 20 mg Take 30-60 min before first meal of the day and Tagamet 400 mg one at bedtime If cough use mucinex dm total 1200 every 12 hours > try 600 every 6 hours prn and if that doesn't work then try benzonate 200mg  every 6 hours as needed  Please remember to go to the  x-ray department downstairs for your tests -  we will call you with the results when they are available. if mucus turn nasty try doxycline 100 mg twice daily with glass of water before eating  Use flutter valve first thing in am and as much as possible during the day Prednisone 10 mg take  4 each am x 2 days,   2 each am x 2 days,  1 each am x 2 days and stop     08/10/2014 f/u ov/Wert re: bronchiectasis  Chief Complaint  Patient presents with  . Follow-up    Pt states that her cough has resolved. She c/o stuffiness in her ears- ? allergies.  has not been using mucinex dm or flutter or needed prn doxy since last ov   Not taking gerd rx either  No hearing loss, ear ache or obvious sinus complaints  rec Any time you are coughing/worse breathing   > prilosec 20 mg Take 30-60 min before first meal of the day and Tagamet 400 mg one at bedtime If cough use mucinex dm total 1200 every 12 hours > try  600 every 6 hours prn and if that doesn't work then try benzonate 200 mg every 6 hours as needed  Please remember to go to the  x-ray department downstairs for your tests - we will call you with the results when they are available If mucus turn nasty try doxycline 100 mg twice daily with glass of water before eating  Use flutter valve first thing in am and as much as possible during the day Plan on seeing ENT next - use the same group as your audiologist > done        02/12/2015 f/u ov/Wert re: bronchiectasis  Chief Complaint  Patient presents with  . 6 month follow up    Nonprod cough - was given prednsione for back problems approx 1 1/2 months ago.  This seemed to help cough.   Hoarseness.    Much better on prednisone for back pain, more cough/ hoarseness off pred but nothing purulent or bloody so hasn't taken doxy yet  rec No change in plan Add tagamet 400 mg at bedtime    05/27/2015  f/u ov/Wert re: bronchiectasis  Chief Complaint  Patient presents with  . Follow-up    PFT results. pt. states breathing is baseline. dry cough. denies wheezing or chest pain/tightness.  doe x one flight / flat surface is ok  Had one episode uti rx macrodantin one week only and no worse cough or sob rec  no change rx   11/24/2015  f/u ov/Wert re:  Doxy not lately/ some sorethroat during the day/choking sensation/hs tagamet   Chief Complaint  Patient presents with  . Follow-up    pt c/o non prod cough, sob w/exertion, chest pressure.   did not start ppi as rec for cough and now also sorethroat, throat congestion but no excess mucus occ grabbing abd pain x one min with coughing fits     Really Not limited by breathing from desired activities    No obvious day to day or daytime variability or assoc excess/ purulent sputum or mucus plugs or hemoptysis    or overt sinus or hb symptoms. No unusual exp hx or h/o childhood pna/ asthma or knowledge of premature birth.  Sleeping ok without nocturnal  or  early am exacerbation  of respiratory  c/o's or need for noct saba. Also denies any obvious fluctuation of symptoms with weather or environmental changes or other aggravating or alleviating factors except as  outlined above   Current Medications, Allergies, Complete Past Medical History, Past Surgical History, Family History, and Social History were reviewed in [employer] record.  ROS  The following are not active complaints unless bolded sore throat, dysphagia, dental problems, itching, sneezing,  nasal congestion or excess/ purulent secretions, ear ache,   fever, chills, sweats, unintended wt loss, classically pleuritic or exertional cp,  orthopnea pnd or leg swelling, presyncope, palpitations, abdominal pain, anorexia, nausea, vomiting, diarrhea  or change in bowel or bladder habits, change in stools or urine, dysuria,hematuria,  rash, arthralgias, visual complaints, headache, numbness, weakness or ataxia or problems with walking or coordination,  change in mood/affect or memory.             Past Medical History:  BRONCHIECTASIS (ICD-494.0) see CT scan of the chest dated 09/29/2005  - Pneumovax April 28, 2009 (over 65 so last one)  Prevnar given 04/27/2014   - Alpha one Screen sent August 18, 2010 >>>  Neg - Sinus CT August 18, 2010 >>  neg  COUGH, CHRONIC (ICD-786.2)  Left Kidney Cyst  - 04/21/09 [facility] PROCEDURE(S): ULTRASOUND GUIDED AND FLUOROSCOPIC GUIDED LEFT RENAL CYST ASPIRATION AND SCLEROSIS  LUQ Abd pain 2011...............................................................Marland KitchenSchooler  - CT ABD 07/29/10 no etiology            Objective:   Physical Exam    thin pleasant ambulatory white female in no acute distress   wt 137 > 137 April 28, 2009 > August 18, 2010 137 >  137 09/29/2010 > 06/21/12 wt 134> 140 03/20/2013 > 04/27/2014  139> 08/10/2014  138 > 02/12/2015 136 >05/27/2015  139 > 11/24/2015 133    HEENT mild turbinate edema. Oropharynx no thrush or excess  pnd or cobblestoning. No JVD or cervical adenopathy. Mild accessory muscle hypertrophy. Trachea midline, nl thryroid. Chest was hyperinflated by percussion with diminished breath sounds and moderate increased exp time without wheeze. Hoover sign positive at mid inspiration. Regular rate and rhythm without murmur gallop or rub or increase P2. no edema Abd: no hsm, nl excursion. Ext warm without cyanosis or clubbing.      CXR PA and Lateral:   02/12/2015 :     I personally reviewed images and agree with radiology impression as follows:   Findings consistent with stable changes of granulomas disease and pleural parenchymal scarring.    Assessment & Plan:

## 2015-11-24 NOTE — Assessment & Plan Note (Signed)
-   See CT Chest 09/29/05 Stable bronchiectasis in right middle lobe and lingula.  Scattered tree in bud opacities throughout the lungs, right greater than left,  nonspecific.   - PFT's 06/21/2012 1.67 (91%) ratio 64 and no change,  DLCO 77% - Flutter valve added 04/27/2014  - PFTs 05/24/15   FEV1 1.55 (81%) ratio 69 p 6% improvement from saba and dlco 62% ratio 88%   Adequate control on present rx, reviewed > no change in rx needed  > suspect her symptoms are worse due to uacs (see separate a/p)

## 2015-11-24 NOTE — Patient Instructions (Addendum)
For  congested cough > mucinex dm up to 1200 every 12 hours  and use the flutter valve as much as possible  For dry cough > delsym 2 tsp every 12 hours as needed and again use the flutter valve as much as possible  For drainage / throat tickle try take CHLORPHENIRAMINE  4 mg - take one every 4 hours as needed - available over the counter- may cause drowsiness so start with just a bedtime dose or two and see how you tolerate it before trying in daytime    Pantoprazole (protonix) 40 mg   Take  30-60 min before first meal of the day and tagament 400  one @  bedtime until  You see Dr Michail Sermon to see if helps your throat/swallowing   GERD (REFLUX)  is an extremely common cause of respiratory symptoms just like yours , many times with no obvious heartburn at all.    It can be treated with medication, but also with lifestyle changes including elevation of the head of your bed (ideally with 6 inch  bed blocks),  Smoking cessation, avoidance of late meals, excessive alcohol, and avoid fatty foods, chocolate, peppermint, colas, red wine, and acidic juices such as orange juice.  NO MINT OR MENTHOL PRODUCTS SO NO COUGH DROPS  USE SUGARLESS CANDY INSTEAD (Jolley ranchers or Stover's or Life Savers) or even ice chips will also do - the key is to swallow to prevent all throat clearing. NO OIL BASED VITAMINS - use powdered substitutes.   Please schedule a follow up visit in 3 months but call sooner if needed with cxr

## 2015-11-24 NOTE — Assessment & Plan Note (Signed)
Classic Upper airway cough syndrome, so named because it's frequently impossible to sort out how much is  CR/sinusitis with freq throat clearing (which can be related to primary GERD)   vs  causing  secondary (" extra esophageal")  GERD from wide swings in gastric pressure that occur with throat clearing, often  promoting self use of mint and menthol lozenges that reduce the lower esophageal sphincter tone and exacerbate the problem further in a cyclical fashion.   These are the same pts (now being labeled as having "irritable larynx syndrome" by some cough centers) who not infrequently have a history of having failed to tolerate ace inhibitors,  dry powder inhalers or biphosphonates or report having atypical reflux symptoms that don't respond to standard doses of PPI , and are easily confused as having aecopd or asthma flares by even experienced allergists/ pulmonologists.   rec max rx for gerd/ flutter valve/ f/u GI next step  I had an extended discussion with the patient reviewing all relevant studies completed to date and  lasting 15 to 20 minutes of a 25 minute visit    Each maintenance medication was reviewed in detail including most importantly the difference between maintenance and prns and under what circumstances the prns are to be triggered using an action plan format that is not reflected in the computer generated alphabetically organized AVS.    Please see instructions for details which were reviewed in writing and the patient given a copy highlighting the part that I personally wrote and discussed at today's ov.

## 2015-12-02 ENCOUNTER — Telehealth: Payer: Self-pay | Admitting: Internal Medicine

## 2015-12-02 MED ORDER — LEVOFLOXACIN 500 MG PO TABS: 500.0000 mg | ORAL_TABLET | Freq: Every day | ORAL | Status: DC

## 2015-12-02 NOTE — Telephone Encounter (Signed)
Called spoke with pt. Reviewed MW's recs and verified pharmacy as [facility] on [address]. She states that she did not have a reaction last time she took the levaquin. Pt voiced understanding and had no further questions. Rx sent. Nothing further sent.

## 2015-12-02 NOTE — Telephone Encounter (Signed)
Spoke with pt and she states that she has been having Right side flank/rib pain for several days. Pt states that it hurts to take a deep breath and cough. She has been careful to avoid GERD foods, she has not started Protonix (was not sent to pharmacy) and has been trying to use flutter valve and do all other recommendations as directed.   MW Please advise. Thanks!   LOV 11/24/15  Patient Instructions     For congested cough > mucinex dm up to 1200 every 12 hours and use the flutter valve as much as possible  For dry cough > delsym 2 tsp every 12 hours as needed and again use the flutter valve as much as possible  For drainage / throat tickle try take CHLORPHENIRAMINE 4 mg - take one every 4 hours as needed - available over the counter- may cause drowsiness so start with just a bedtime dose or two and see how you tolerate it before trying in daytime   Pantoprazole (protonix) 40 mg Take 30-60 min before first meal of the day and tagament 400 one @ bedtime until You see Dr Michail Sermon to see if helps your throat/swallowing   GERD (REFLUX) is an extremely common cause of respiratory symptoms just like yours , many times with no obvious heartburn at all.   It can be treated with medication, but also with lifestyle changes including elevation of the head of your bed (ideally with 6 inch bed blocks), Smoking cessation, avoidance of late meals, excessive alcohol, and avoid fatty foods, chocolate, peppermint, colas, red wine, and acidic juices such as orange juice.  NO MINT OR MENTHOL PRODUCTS SO NO COUGH DROPS  USE SUGARLESS CANDY INSTEAD (Jolley ranchers or Stover's or Life Savers) or even ice chips will also do - the key is to swallow to prevent all throat clearing. NO OIL BASED VITAMINS - use powdered substitutes.   Please schedule a follow up visit in 3 months but call sooner if needed with cxr

## 2015-12-02 NOTE — Telephone Encounter (Signed)
Should be taking protonix as rec unless instructed otherwise by Michail Sermon (apologize for the snafu but she should have called [place] back the next day if I made a rec for a med that wasn't available in the drugstore w/in an hour)  Needs to take levaquin 500 mg daily x7 days unless had a reaction to it last time and if so doxy 100 bid but either way ov in one week   Ok to use norco for pain and ok to call in #40 if needs more

## 2015-12-09 DIAGNOSIS — R1084 Generalized abdominal pain: Secondary | ICD-10-CM | POA: Diagnosis not present

## 2015-12-27 ENCOUNTER — Ambulatory Visit: Payer: Medicare Other | Admitting: Internal Medicine

## 2016-02-04 ENCOUNTER — Encounter: Payer: Self-pay | Admitting: Cardiovascular Disease

## 2016-02-21 ENCOUNTER — Encounter (INDEPENDENT_AMBULATORY_CARE_PROVIDER_SITE_OTHER): Payer: Self-pay

## 2016-02-21 ENCOUNTER — Ambulatory Visit (INDEPENDENT_AMBULATORY_CARE_PROVIDER_SITE_OTHER): Payer: Medicare Other | Admitting: Cardiovascular Disease

## 2016-02-21 ENCOUNTER — Encounter: Payer: Self-pay | Admitting: Cardiovascular Disease

## 2016-02-21 VITALS — BP 136/65 | HR 76 | Ht 64.0 in | Wt 130.8 lb

## 2016-02-21 DIAGNOSIS — R55 Syncope and collapse: Secondary | ICD-10-CM | POA: Diagnosis not present

## 2016-02-21 DIAGNOSIS — R002 Palpitations: Secondary | ICD-10-CM | POA: Diagnosis not present

## 2016-02-21 NOTE — Patient Instructions (Signed)

## 2016-02-21 NOTE — Progress Notes (Signed)
[name]  Date of Birth  [birth year] [facility] [id number] [place], [place]  [address] [phone]  Fax  [phone]   P roblem list: 1. Palpitations 2. History of syncope 3. Dizziness 4. Mild hypertension  History of Present Illness:  [name] is an elderly female with a history of palpitations and history of dizziness. She has mild hypertension. She's done very well since I last saw her. She has not had any episodes of chest pain or shortness of breath. She has had some leg discomfort particularly on busy days.  Sept. 27, 2016: [name] is seen back for office visit. Has had some lightheadedness on occasion .  Lasts for a few seconds  Occurs at various times, not associated with any particular activity .  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   Feb. 1, 2017 Has been under lots of stress,  Husband had neck surgery and it was complicated by some paralysis  Has been running back and forth to [facility] (Dr. Carloyn Manner did the surgery)    Sept. 18, 2017:  [name] is seen back for follow up visit. Has palpitations. She will wore a monitor but was only able to tolerate for 2 days. She did not have any significant arrhythmias during this 2 days of monitoring.  Bp is elevated here BP readings at home are normal at home  Palpitations seem to be well controlled.     Current Outpatient Prescriptions on File Prior to Visit  Medication Sig Dispense Refill  . acetaminophen (TYLENOL) 325 MG tablet Take 650 mg by mouth every 6 (six) hours as needed.    Marland Kitchen aspirin-acetaminophen-caffeine (EXCEDRIN MIGRAINE) 250-250-65 MG per tablet Take 1 tablet by mouth every 6 (six) hours as needed for headache.    . beta carotene w/minerals (OCUVITE) tablet Take 1 tablet by mouth daily.    Marland Kitchen BIOTIN MAXIMUM STRENGTH PO Take 1 tablet by mouth daily.    Marland Kitchen CALCIUM PO Take 1,500 mg by mouth daily.      .  Cholecalciferol (VITAMIN D) 2000 UNITS CAPS Take 1 capsule by mouth daily.     Marland Kitchen dextromethorphan-guaiFENesin (MUCINEX DM) 30-600 MG per 12 hr tablet Take 1 tablet by mouth daily as needed for cough.    Marland Kitchen HYDROcodone-acetaminophen (NORCO/VICODIN) 5-325 MG per tablet Take 1 tablet by mouth at bedtime.    Marland Kitchen levofloxacin (LEVAQUIN) 500 MG tablet Take 1 tablet (500 mg total) by mouth daily. 7 tablet 0  . Multiple Vitamin (MULTIVITAMIN) tablet Take 1 tablet by mouth daily.      Marland Kitchen Respiratory Therapy Supplies (FLUTTER) DEVI Use as directed 1 each 0   No current facility-administered medications on file prior to visit.     Allergies  Allergen Reactions  . Ciprofloxacin Other (See Comments)    triggered a migraine that was thought to be a TIA  . Penicillins Rash  . Sulfonamide Derivatives Swelling  . Avelox [Moxifloxacin]     Rash and severe tingling/numbness   . Reclast [Zoledronic Acid]     hypertension  . Chlorpheniramine Other (See Comments)    Headache, stomach ache, bladder "not working well"    Past Medical History:  Diagnosis Date  . Arm numbness left   . Back pain   . Bronchitis, chronic ([place])   . Depression   . Dizziness   . Fatigue   . Hypertension  mild  . Palpitations   . Skin cancer   . Stress   . Syncope    History of     Past Surgical History:  Procedure Laterality Date  . APPENDECTOMY    . CESAREAN SECTION  [phone]    History  Smoking Status  . Never Smoker  Smokeless Tobacco  . Never Used    History  Alcohol Use  . Yes    Comment: social ETOH    Family History  Problem Relation Age of Onset  . Atrial fibrillation Mother   . Alcohol abuse Father     Reviw of Systems:  Reviewed in the HPI.  All other systems are negative.  Physical Exam: BP (!) 160/90 (BP Location: Left Arm, Patient Position: Sitting, Cuff Size: Normal)   Pulse 76   Ht 5\' 4"  (1.626 m)   Wt 130 lb 12.8 oz (59.3 kg)   BMI 22.45 kg/m  The patient is alert and  oriented x 3.  The mood and affect are normal.  The skin is warm and dry.  Color is normal.  The HEENT exam reveals that the sclera are nonicteric.  The mucous membranes are moist.  The carotids are 2+ without bruits.  There is no thyromegaly.  There is no JVD.  The lungs are clear.  The chest wall is non tender.  The heart exam reveals a regular rate with a normal S1 and S2.  There are no murmurs, gallops, or rubs.  The PMI is not displaced.   Abdominal exam reveals good bowel sounds.  There is no guarding or rebound.  There is no hepatosplenomegaly or tenderness.  There are no masses.  Exam of the legs reveal no clubbing, cyanosis, or edema.  The legs are without rashes.  The distal pulses are intact.  Cranial nerves II - XII are intact.  Motor and sensory functions are intact.  The gait is normal.  ECG:  NSR at 76.   Sinus arrhythmia   Assessment / Plan:   1. Palpitations - no significant arrhythmias seen on the monitor ( wore it for only 2 days)  Will continue to follow   2. History of syncope -   3. Dizziness  4. Mild hypertension - stable , BP was initially elevated but when we rechecked it, her BP was normal    Mertie Moores, MD  02/21/2016 4:44 PM    [facility] [place],  [place], [place]  [address] Pager [phone] Phone: [phone]; Fax: [phone]   [facility]  [address] [place], [place]  [address] [phone]   Fax [phone]

## 2016-03-01 ENCOUNTER — Ambulatory Visit (INDEPENDENT_AMBULATORY_CARE_PROVIDER_SITE_OTHER): Payer: Medicare Other | Admitting: Internal Medicine

## 2016-03-01 ENCOUNTER — Ambulatory Visit (INDEPENDENT_AMBULATORY_CARE_PROVIDER_SITE_OTHER)
Admission: RE | Admit: 2016-03-01 | Discharge: 2016-03-01 | Disposition: A | Discharge status: Home / Self Care | Payer: Medicare Other | Source: Ambulatory Visit | Attending: Internal Medicine | Admitting: Internal Medicine

## 2016-03-01 ENCOUNTER — Encounter: Payer: Self-pay | Admitting: Internal Medicine

## 2016-03-01 VITALS — BP 148/70 | HR 71 | Ht 64.0 in | Wt 130.5 lb

## 2016-03-01 DIAGNOSIS — J471 Bronchiectasis with (acute) exacerbation: Secondary | ICD-10-CM

## 2016-03-01 DIAGNOSIS — R05 Cough: Secondary | ICD-10-CM | POA: Diagnosis not present

## 2016-03-01 DIAGNOSIS — R058 Other specified cough: Secondary | ICD-10-CM

## 2016-03-01 DIAGNOSIS — J479 Bronchiectasis, uncomplicated: Secondary | ICD-10-CM

## 2016-03-01 MED ORDER — DOXYCYCLINE HYCLATE 100 MG PO TABS: 100.0000 mg | ORAL_TABLET | Freq: Two times a day (BID) | ORAL | 11 refills | Status: DC

## 2016-03-01 NOTE — Patient Instructions (Addendum)
No change in medications  Nasty mucus > doxycline 100 mg twice daily x 10 days   Please remember to go to the x-ray department downstairs for your tests - we will call you with the results when they are available.     Please schedule a follow up visit in 6 months but call sooner if needed  Late add for nodular opacity RUL c/w MAI:  zmax x 250 mg daily x 30 days then recheck cxr

## 2016-03-01 NOTE — Progress Notes (Signed)
Subjective:    Patient ID: [name], female    DOB: 08/17/1932    MRN: [id number]    Primary Provider/Referring Provider: Dr Doyle Askew   Brief patient profile:  75  yowf never smoker with recurrent cough since age 82 with right middle lobe/lingular syndrome with limited bronchiectatic changes on  CT study dated 09/2005    History of Present Illness  April 28, 2009 Acute visit. Pt c/o dry cough worse since September occ yellow.  rec: Levaquin 750 x 5 days  Prednisone 4 each am x 2days, 2x2days, 1x2days and stop  Vicodin 5 one every 4 hours for excess cough or pain  Pneumonia @ ov = last one unless the CDC guidelines change  Flu shot today.   03/20/2013 f/u ov/Joey Lierman re: bronchiectasis Chief Complaint  Patient presents with  . Followup with CXR    Pt reports cough worse x 1 wk- started out with sore throat. Cough is prod with large amounts of yellow to white sputum.    better for about 6 months then  sev months prior to OV   noted indolent onset increased cough qam p stirring up not productive then changed in sept 2014 more productive white then turned yellow with sorethroat x one week     Never took any pepcid or prilosec rec Any time you are coughing > prilosec 20 mg Take 30-60 min before first meal of the dayTagamet 400 mg one at bedtime If cough use mucinex dm total 1200 every 12 hours  If sputum turns nasty > levaquin 750 x 5 days > hurt  in joints esp elbows so stopped it  Bronchiectasis pathophys reviewed   11/24/2015  f/u ov/Uzma Hellmer re:  Doxy not lately/ some sorethroat during the day/choking sensation/hs tagamet   Chief Complaint  Patient presents with  . Follow-up    pt c/o non prod cough, sob w/exertion, chest pressure.   did not start ppi as rec for cough and now also sorethroat, throat congestion but no excess mucus occ grabbing abd pain x one min with coughing fits     Really Not limited by breathing from desired activities   rec For  congested cough > mucinex dm  up to 1200 every 12 hours  and use the flutter valve as much as possible For dry cough > delsym 2 tsp every 12 hours as needed and again use the flutter valve as much as possible For drainage / throat tickle try take CHLORPHENIRAMINE  4 mg - take one every 4 hours as needed - available over the counter- may cause drowsiness so start with just a bedtime dose or two and see how you tolerate it before trying in daytime   Pantoprazole (protonix) 40 mg   Take  30-60 min before first meal of the day and tagament 400  one @  bedtime until  You see Dr Michail Sermon to see if helps your throat/swallowing  GERD diet     03/01/2016  f/u ov/Rhone Ozaki re:  Bronchiectasis with uacs/ some better on gerd rx  Chief Complaint  Patient presents with  . Follow-up    Pt states her dry cough is unchanged since last OV. Pt states she has ocassional chest tightness and SOB but attributes this to stress. Pt also c/o hoarseness.    coughs up to an hour each am, completely non-productive even with flutter   No obvious day to day or daytime variability or assoc excess/ purulent sputum or mucus plugs or hemoptysis  or overt sinus or hb symptoms. No unusual exp hx or h/o childhood pna/ asthma or knowledge of premature birth.  Sleeping ok without nocturnal  or early am exacerbation  of respiratory  c/o's or need for noct saba. Also denies any obvious fluctuation of symptoms with weather or environmental changes or other aggravating or alleviating factors except as outlined above   Current Medications, Allergies, Complete Past Medical History, Past Surgical History, Family History, and Social History were reviewed in [employer] record.  ROS  The following are not active complaints unless bolded sore throat, dysphagia, dental problems, itching, sneezing,  nasal congestion or excess/ purulent secretions, ear ache,   fever, chills, sweats, unintended wt loss, classically pleuritic or exertional cp,  orthopnea pnd  or leg swelling, presyncope, palpitations, abdominal pain, anorexia, nausea, vomiting, diarrhea  or change in bowel or bladder habits, change in stools or urine, dysuria,hematuria,  rash, arthralgias, visual complaints, headache, numbness, weakness or ataxia or problems with walking or coordination,  change in mood/affect or memory.             Past Medical History:  BRONCHIECTASIS (ICD-494.0) see CT scan of the chest dated 09/29/2005  - Pneumovax April 28, 2009 (over 65 so last one)  Prevnar given 04/27/2014   - Alpha one Screen sent August 18, 2010 >>>  Neg - Sinus CT August 18, 2010 >>  neg  COUGH, CHRONIC (ICD-786.2)  Left Kidney Cyst  - 04/21/09 [facility] PROCEDURE(S): ULTRASOUND GUIDED AND FLUOROSCOPIC GUIDED LEFT RENAL CYST ASPIRATION AND SCLEROSIS  LUQ Abd pain 2011...............................................................Marland KitchenSchooler  - CT ABD 07/29/10 no etiology            Objective:   Physical Exam    thin pleasant ambulatory white female in no acute distress   Vital signs reviewed > note sats 97% RA  wt 137 > 137 April 28, 2009 > August 18, 2010 137 >  137 09/29/2010 > 06/21/12 wt 134> 140 03/20/2013 > 04/27/2014  139> 08/10/2014  138 > 02/12/2015 136 >05/27/2015  139 > 11/24/2015 133 > 03/01/2016  130    HEENT mild turbinate edema. Oropharynx no thrush or excess pnd or cobblestoning. No JVD or cervical adenopathy. Mild accessory muscle hypertrophy. Trachea midline, nl thryroid. Chest was hyperinflated by percussion with diminished breath sounds and moderate increased exp time without wheeze. Hoover sign positive at mid inspiration. Regular rate and rhythm without murmur gallop or rub or increase P2. no edema Abd: no hsm, nl excursion. Ext warm without cyanosis or clubbing.       CXR PA and Lateral:   03/01/2016 :    I personally reviewed images and agree with radiology impression as follows:    New nodular density projected over the right upper lung, suspicious for new  pulmonary nodule, measuring approximately 1.6 cm. Recommend chest CT for further characterization.    Assessment & Plan:

## 2016-03-02 ENCOUNTER — Encounter: Payer: Self-pay | Admitting: Internal Medicine

## 2016-03-02 ENCOUNTER — Telehealth: Payer: Self-pay | Admitting: Internal Medicine

## 2016-03-02 MED ORDER — AZITHROMYCIN 250 MG PO TABS: 250.0000 mg | ORAL_TABLET | Freq: Every day | ORAL | 0 refills | Status: DC

## 2016-03-02 NOTE — Assessment & Plan Note (Signed)
Trial of max gerd rx 11/24/2015 >>>   Sore throat / dysphagia better on max  RX > no changes needed

## 2016-03-02 NOTE — Assessment & Plan Note (Signed)
-   See CT Chest 09/29/05 Stable bronchiectasis in right middle lobe and lingula.  Scattered tree in bud opacities throughout the lungs, right greater than left,  nonspecific.   - PFT's 06/21/2012 1.67 (91%) ratio 64 and no change,  DLCO 77% - Flutter valve added 04/27/2014  - PFTs 05/24/15   FEV1 1.55 (81%) ratio 69 p 6% improvement from saba and dlco 62% ratio [id number]   Complicated now by new nodular opacity RUL almost certainly MAI in this never smoker so rec zmax 250 mg daily x 30 days and repeat cxr (conservative rx as she is so intolerant to so many abx)  I had an extended discussion with the patient reviewing all relevant studies completed to date and  lasting 15 to 20 minutes of a 25 minute visit    Each maintenance medication was reviewed in detail including most importantly the difference between maintenance and prns and under what circumstances the prns are to be triggered using an action plan format that is not reflected in the computer generated alphabetically organized AVS.    Please see instructions for details which were reviewed in writing and the patient given a copy highlighting the part that I personally wrote and discussed at today's ov.

## 2016-03-02 NOTE — Telephone Encounter (Signed)
Rita Rockers, MD  Rosana Berger, CMA        Call pt: Reviewed cxr and c/w low grade infection so rec try new rx:= zmax 250 mg daily x 4 weeks then ov with cxr   ---  I spoke with patient about results and she verbalized understanding and had no questions. Rx sent in. appt scheduled.  Nothing further needed

## 2016-03-02 NOTE — Progress Notes (Signed)
lmtcb

## 2016-03-29 ENCOUNTER — Other Ambulatory Visit: Payer: Self-pay

## 2016-03-29 ENCOUNTER — Encounter: Payer: Self-pay | Admitting: Internal Medicine

## 2016-03-29 ENCOUNTER — Ambulatory Visit (INDEPENDENT_AMBULATORY_CARE_PROVIDER_SITE_OTHER)
Admission: RE | Admit: 2016-03-29 | Discharge: 2016-03-29 | Disposition: A | Discharge status: Home / Self Care | Payer: Medicare Other | Source: Ambulatory Visit | Attending: Internal Medicine | Admitting: Internal Medicine

## 2016-03-29 ENCOUNTER — Ambulatory Visit (INDEPENDENT_AMBULATORY_CARE_PROVIDER_SITE_OTHER): Payer: Medicare Other | Admitting: Internal Medicine

## 2016-03-29 VITALS — BP 122/74 | HR 63 | Ht 64.0 in | Wt 133.0 lb

## 2016-03-29 DIAGNOSIS — J471 Bronchiectasis with (acute) exacerbation: Secondary | ICD-10-CM

## 2016-03-29 DIAGNOSIS — J479 Bronchiectasis, uncomplicated: Secondary | ICD-10-CM

## 2016-03-29 DIAGNOSIS — R05 Cough: Secondary | ICD-10-CM | POA: Diagnosis not present

## 2016-03-29 DIAGNOSIS — R058 Other specified cough: Secondary | ICD-10-CM

## 2016-03-29 DIAGNOSIS — R911 Solitary pulmonary nodule: Secondary | ICD-10-CM | POA: Diagnosis not present

## 2016-03-29 MED ORDER — AZITHROMYCIN 250 MG PO TABS: 250.0000 mg | ORAL_TABLET | Freq: Every day | ORAL | 11 refills | Status: DC

## 2016-03-29 NOTE — Patient Instructions (Addendum)
Please see patient coordinator before you leave today  to schedule HRCT chest   Continue zithromax daily for now    Pantoprazole (protonix) 40 mg   Take  30-60 min before first meal of the day and tagament 400  one @  Bedtime   GERD (REFLUX)  is an extremely common cause of respiratory symptoms just like yours , many times with no obvious heartburn at all.    It can be treated with medication, but also with lifestyle changes including elevation of the head of your bed (ideally with 6 inch  bed blocks),  Smoking cessation, avoidance of late meals, excessive alcohol, and avoid fatty foods, chocolate, peppermint, colas, red wine, and acidic juices such as orange juice.  NO MINT OR MENTHOL PRODUCTS SO NO COUGH DROPS  USE SUGARLESS CANDY INSTEAD (Jolley ranchers or Stover's or Life Savers) or even ice chips will also do - the key is to swallow to prevent all throat clearing. NO OIL BASED VITAMINS - use powdered substitutes.    Please schedule a follow up office visit in 6 weeks, call sooner if needed

## 2016-03-29 NOTE — Progress Notes (Signed)
Subjective:    Patient ID: [name], female    DOB: 1933/03/26    MRN: [id number]    Primary Provider/Referring Provider: Dr Doyle Lane   Brief patient profile:  82  yowf never smoker with recurrent cough since age 82 with right middle lobe/lingular syndrome with limited bronchiectatic changes on  CT study dated 09/2005    History of Present Illness  April 28, 2009 Acute visit. Pt c/o dry cough worse since September occ yellow.  rec: Levaquin 750 x 5 days  Prednisone 4 each am x 2days, 2x2days, 1x2days and stop  Vicodin 5 one every 4 hours for excess cough or pain  Pneumonia @ ov = last one unless the CDC guidelines change  Flu shot today.   03/20/2013 f/u ov/[name] re: bronchiectasis Chief Complaint  Patient presents with  . Followup with CXR    Pt reports cough worse x 1 wk- started out with sore throat. Cough is prod with large amounts of yellow to white sputum.    better for about 6 months then  sev months prior to OV   noted indolent onset increased cough qam p stirring up not productive then changed in sept 2014 more productive white then turned yellow with sorethroat x one week     Never took any pepcid or prilosec rec Any time you are coughing > prilosec 20 mg Take 30-60 min before first meal of the dayTagamet 400 mg one at bedtime If cough use mucinex dm total 1200 every 12 hours  If sputum turns nasty > levaquin 750 x 5 days > hurt  in joints esp elbows so stopped it  Bronchiectasis pathophys reviewed   11/24/2015  f/u ov/[name] re:  Doxy not lately/ some sorethroat during the day/choking sensation/hs tagamet   Chief Complaint  Patient presents with  . Follow-up    pt c/o non prod cough, sob w/exertion, chest pressure.   did not start ppi as rec for cough and now also sorethroat, throat congestion but no excess mucus occ grabbing abd pain x one min with coughing fits     Really Not limited by breathing from desired activities   rec For  congested cough > mucinex dm  up to 1200 every 12 hours  and use the flutter valve as much as possible For dry cough > delsym 2 tsp every 12 hours as needed and again use the flutter valve as much as possible For drainage / throat tickle try take CHLORPHENIRAMINE  4 mg - take one every 4 hours as needed - available over the counter- may cause drowsiness so start with just a bedtime dose or two and see how you tolerate it before trying in daytime   Pantoprazole (protonix) 40 mg   Take  30-60 min before first meal of the day and tagament 400  one @  bedtime until  You see [name] to see if helps your throat/swallowing  GERD diet     03/01/2016  f/u ov/[name] re:  Bronchiectasis with uacs/ some better on gerd rx  Chief Complaint  Patient presents with  . Follow-up    Pt states her dry cough is unchanged since last OV. Pt states she has ocassional chest tightness and SOB but attributes this to stress. Pt also c/o hoarseness.    coughs up to an hour each am, completely non-productive even with flutter  rec No change in medications Nasty mucus > doxycline 100 mg twice daily x 10 days  Please remember to go to  the x-ray department downstairs for your tests - we will call you with the results when they are available. Please schedule a follow up visit in 6 months but call sooner if needed  Late add for nodular opacity RUL c/w MAI    zmax x 250 mg daily x 30 days then recheck cxr      03/29/2016  f/u ov/[name] re: obst bronchiectasis / uacs maint rx zmax  Chief Complaint  Patient presents with  . Follow-up    CXR was done today. She is coughing less but still having some hoarseness.  She gets SOB walking up stairs.    cough is now dry/  throat is worse off all gerd rx  Doe = MMRC1 = can walk nl pace, flat grade, can't hurry or go uphills or steps s sob     No obvious day to day or daytime variability or assoc excess/ purulent sputum or mucus plugs or hemoptysis    or overt sinus or hb symptoms. No unusual exp hx or h/o  childhood pna/ asthma or knowledge of premature birth.  Sleeping ok without nocturnal  or early am exacerbation  of respiratory  c/o's or need for noct saba. Also denies any obvious fluctuation of symptoms with weather or environmental changes or other aggravating or alleviating factors except as outlined above   Current Medications, Allergies, Complete Past Medical History, Past Surgical History, Family History, and Social History were reviewed in [employer] record.  ROS  The following are not active complaints unless bolded sore throat, dysphagia, dental problems, itching, sneezing,  nasal congestion or excess/ purulent secretions, ear ache,   fever, chills, sweats, unintended wt loss, classically pleuritic or exertional cp,  orthopnea pnd or leg swelling, presyncope, palpitations, abdominal pain, anorexia, nausea, vomiting, diarrhea  or change in bowel or bladder habits, change in stools or urine, dysuria,hematuria,  rash, arthralgias, visual complaints, headache, numbness, weakness or ataxia or problems with walking or coordination,  change in mood/affect or memory.             Past Medical History:  BRONCHIECTASIS (ICD-494.0) see CT scan of the chest dated 09/29/2005  - Pneumovax April 28, 2009 (over 82 so last one)  Prevnar given 04/27/2014   - Alpha one Screen sent August 18, 2010 >>>  Neg - Sinus CT August 18, 2010 >>  neg  COUGH, CHRONIC (ICD-786.2)  Left Kidney Cyst  - 04/21/09 [facility] PROCEDURE(S): ULTRASOUND GUIDED AND FLUOROSCOPIC GUIDED LEFT RENAL CYST ASPIRATION AND SCLEROSIS  LUQ Abd pain 2011...............................................................Marland KitchenSchooler  - CT ABD 07/29/10 no etiology            Objective:   Physical Exam    thin pleasant ambulatory white female in no acute distress   Vital signs reviewed > note sats 97% RA  wt 137 > 137 April 28, 2009 > August 18, 2010 137 >  137 09/29/2010 > 06/21/12 wt 134> 140 03/20/2013 >  04/27/2014  139> 08/10/2014  138 > 02/12/2015 136 >05/27/2015  139 > 11/24/2015 133 > 03/01/2016  130    HEENT mild turbinate edema. Oropharynx no thrush or excess pnd or cobblestoning. No JVD or cervical adenopathy. Mild accessory muscle hypertrophy. Trachea midline, nl thryroid. Chest was hyperinflated by percussion with diminished breath sounds and moderate increased exp time without wheeze. Hoover sign positive at mid inspiration. Regular rate and rhythm without murmur gallop or rub or increase P2. no edema Abd: no hsm, nl excursion. Ext warm without cyanosis or clubbing.  CXR PA and Lateral:   03/29/2016 :    I personally reviewed images and agree with radiology impression as follows:    Persistent right upper lobe prominent nodule. No interval resolution. Again chest CT suggest for further evaluation.   Assessment & Plan:

## 2016-04-02 NOTE — Assessment & Plan Note (Signed)
-   See CT Chest 09/29/05 Stable bronchiectasis in right middle lobe and lingula.  Scattered tree in bud opacities throughout the lungs, right greater than left,  nonspecific.   - PFT's 06/21/2012 1.67 (91%) ratio 64 and no change,  DLCO 77% - Flutter valve added 04/27/2014  - PFTs 05/24/15   FEV1 1.55 (81%) ratio 69 p 6% improvement from saba and dlco 62% ratio 88%   - HRCT next step > ordered   I had an extended discussion with the patient reviewing all relevant studies completed to date and  lasting 15 to 20 minutes of a 25 minute visit    Each maintenance medication was reviewed in detail including most importantly the difference between maintenance and prns and under what circumstances the prns are to be triggered using an action plan format that is not reflected in the computer generated alphabetically organized AVS.    Please see instructions for details which were reviewed in writing and the patient given a copy highlighting the part that I personally wrote and discussed at today's ov.

## 2016-04-02 NOTE — Assessment & Plan Note (Signed)
Trial of max gerd rx 11/24/2015 >> worse off rx 03/29/2016 > rec resume

## 2016-04-04 ENCOUNTER — Ambulatory Visit (INDEPENDENT_AMBULATORY_CARE_PROVIDER_SITE_OTHER)
Admission: RE | Admit: 2016-04-04 | Discharge: 2016-04-04 | Disposition: A | Discharge status: Home / Self Care | Payer: Medicare Other | Source: Ambulatory Visit | Attending: Internal Medicine | Admitting: Internal Medicine

## 2016-04-04 DIAGNOSIS — J471 Bronchiectasis with (acute) exacerbation: Secondary | ICD-10-CM | POA: Diagnosis not present

## 2016-04-04 DIAGNOSIS — J479 Bronchiectasis, uncomplicated: Secondary | ICD-10-CM

## 2016-04-06 ENCOUNTER — Other Ambulatory Visit: Payer: Self-pay | Admitting: Internal Medicine

## 2016-04-06 DIAGNOSIS — E041 Nontoxic single thyroid nodule: Secondary | ICD-10-CM

## 2016-04-06 NOTE — Progress Notes (Signed)
Spoke with pt and notified of results per Dr. Wert. Pt verbalized understanding and denied any questions. 

## 2016-04-13 ENCOUNTER — Other Ambulatory Visit: Payer: Self-pay | Admitting: Internal Medicine

## 2016-04-13 ENCOUNTER — Ambulatory Visit (HOSPITAL_COMMUNITY)
Admission: RE | Admit: 2016-04-13 | Discharge: 2016-04-13 | Disposition: A | Discharge status: Home / Self Care | Payer: Medicare Other | Source: Ambulatory Visit | Attending: Internal Medicine | Admitting: Internal Medicine

## 2016-04-13 DIAGNOSIS — E041 Nontoxic single thyroid nodule: Secondary | ICD-10-CM

## 2016-04-13 NOTE — Progress Notes (Signed)
Spoke with pt and notified of results per Dr. Wert. Pt verbalized understanding and denied any questions. 

## 2016-04-14 ENCOUNTER — Other Ambulatory Visit: Payer: Self-pay | Admitting: Internal Medicine

## 2016-04-14 DIAGNOSIS — E041 Nontoxic single thyroid nodule: Secondary | ICD-10-CM

## 2016-04-19 ENCOUNTER — Telehealth: Payer: Self-pay | Admitting: Internal Medicine

## 2016-04-19 NOTE — Telephone Encounter (Signed)
lmtcb X1 for pt  

## 2016-04-20 NOTE — Telephone Encounter (Signed)
Pt returning call and can be reached @ [phone] and says that it is ok to leave msg.[name]

## 2016-04-20 NOTE — Telephone Encounter (Signed)
Spoke with the pt  She is concerned that nobody has called her about thyroid bx  [facility]'s can you please find out why they have not called her yet? Thanks

## 2016-04-21 NOTE — Telephone Encounter (Signed)
Spoke with pt. She states that [name] has already called her about this appointment. Nothing further was needed.

## 2016-04-21 NOTE — Telephone Encounter (Signed)
This will be done@ [facility] we have verified they can see the order and they will be calling to set this up # there is [phone] Joellen Jersey

## 2016-05-02 ENCOUNTER — Ambulatory Visit
Admission: RE | Admit: 2016-05-02 | Discharge: 2016-05-02 | Disposition: A | Discharge status: Home / Self Care | Payer: Medicare Other | Source: Ambulatory Visit | Attending: Internal Medicine | Admitting: Internal Medicine

## 2016-05-02 ENCOUNTER — Other Ambulatory Visit (HOSPITAL_COMMUNITY)
Admission: RE | Admit: 2016-05-02 | Discharge: 2016-05-02 | Disposition: A | Discharge status: Home / Self Care | Payer: Medicare Other | Source: Ambulatory Visit | Attending: Radiology | Admitting: Radiology

## 2016-05-02 DIAGNOSIS — E041 Nontoxic single thyroid nodule: Secondary | ICD-10-CM | POA: Diagnosis present

## 2016-05-02 DIAGNOSIS — E079 Disorder of thyroid, unspecified: Secondary | ICD-10-CM | POA: Diagnosis not present

## 2016-05-05 ENCOUNTER — Telehealth: Payer: Self-pay | Admitting: Internal Medicine

## 2016-05-05 NOTE — Telephone Encounter (Signed)
Notes Recorded by Tanda Rockers, MD on 05/05/2016 at 6:39 AM EST Call patient : Study is non-diagnostic but there is no CA, could send to thyroid specialist if she needs more info but for now would put off further w/u --------------- Spoke with pt, aware of results/recs.  Nothing further needed.

## 2016-05-05 NOTE — Progress Notes (Signed)
LMTCB

## 2016-05-10 ENCOUNTER — Encounter: Payer: Self-pay | Admitting: Internal Medicine

## 2016-05-10 ENCOUNTER — Ambulatory Visit (INDEPENDENT_AMBULATORY_CARE_PROVIDER_SITE_OTHER): Payer: Medicare Other | Admitting: Internal Medicine

## 2016-05-10 ENCOUNTER — Ambulatory Visit (INDEPENDENT_AMBULATORY_CARE_PROVIDER_SITE_OTHER)
Admission: RE | Admit: 2016-05-10 | Discharge: 2016-05-10 | Disposition: A | Discharge status: Home / Self Care | Payer: Medicare Other | Source: Ambulatory Visit | Attending: Internal Medicine | Admitting: Internal Medicine

## 2016-05-10 VITALS — BP 160/74 | HR 80 | Ht 64.0 in | Wt 134.0 lb

## 2016-05-10 DIAGNOSIS — J471 Bronchiectasis with (acute) exacerbation: Secondary | ICD-10-CM

## 2016-05-10 DIAGNOSIS — R05 Cough: Secondary | ICD-10-CM

## 2016-05-10 DIAGNOSIS — E041 Nontoxic single thyroid nodule: Secondary | ICD-10-CM

## 2016-05-10 DIAGNOSIS — J479 Bronchiectasis, uncomplicated: Secondary | ICD-10-CM

## 2016-05-10 DIAGNOSIS — R058 Other specified cough: Secondary | ICD-10-CM

## 2016-05-10 NOTE — Progress Notes (Signed)
Subjective:    Patient ID: [name], female    DOB: [birth year]    MRN: [id number]    Primary Provider/Referring Provider: Dr Doyle Askew   Brief patient profile:  51  yowf never smoker with recurrent cough since age 82 with right middle lobe/lingular syndrome with limited bronchiectatic changes on  CT study dated 09/2005    History of Present Illness  April 28, 2009 Acute visit. Pt c/o dry cough worse since September occ yellow.  rec: Levaquin 750 x 5 days  Prednisone 4 each am x 2days, 2x2days, 1x2days and stop  Vicodin 5 one every 4 hours for excess cough or pain  Pneumonia @ ov = last one unless the CDC guidelines change  Flu shot today.   03/20/2013 f/u ov/[name] re: bronchiectasis Chief Complaint  Patient presents with  . Followup with CXR    Pt reports cough worse x 1 wk- started out with sore throat. Cough is prod with large amounts of yellow to white sputum.    better for about 6 months then  sev months prior to OV   noted indolent onset increased cough qam p stirring up not productive then changed in sept 2014 more productive white then turned yellow with sorethroat x one week     Never took any pepcid or prilosec rec Any time you are coughing > prilosec 20 mg Take 30-60 min before first meal of the dayTagamet 400 mg one at bedtime If cough use mucinex dm total 1200 every 12 hours  If sputum turns nasty > levaquin 750 x 5 days > hurt  in joints esp elbows so stopped it  Bronchiectasis pathophys reviewed   11/24/2015  f/u ov/[name] re:  Doxy not lately/ some sorethroat during the day/choking sensation/hs tagamet   Chief Complaint  Patient presents with  . Follow-up    pt c/o non prod cough, sob w/exertion, chest pressure.   did not start ppi as rec for cough and now also sorethroat, throat congestion but no excess mucus occ grabbing abd pain x one min with coughing fits     Really Not limited by breathing from desired activities   rec For  congested cough > mucinex dm  up to 1200 every 12 hours  and use the flutter valve as much as possible For dry cough > delsym 2 tsp every 12 hours as needed and again use the flutter valve as much as possible For drainage / throat tickle try take CHLORPHENIRAMINE  4 mg - take one every 4 hours as needed - available over the counter- may cause drowsiness so start with just a bedtime dose or two and see how you tolerate it before trying in daytime   Pantoprazole (protonix) 40 mg   Take  30-60 min before first meal of the day and tagament 400  one @  bedtime until  You see Dr Michail Sermon to see if helps your throat/swallowing  GERD diet     03/01/2016  f/u ov/[name] re:  Bronchiectasis with uacs/ some better on gerd rx  Chief Complaint  Patient presents with  . Follow-up    Pt states her dry cough is unchanged since last OV. Pt states she has ocassional chest tightness and SOB but attributes this to stress. Pt also c/o hoarseness.    coughs up to an hour each am, completely non-productive even with flutter  rec No change in medications Nasty mucus > doxycline 100 mg twice daily x 10 days    Please schedule a  follow up visit in 6 months but call sooner if needed  Late add for nodular opacity RUL c/w MAI    zmax x 250 mg daily x 30 days then recheck cxr       03/29/2016  f/u ov/[name] re: obst bronchiectasis / uacs maint rx zmax  Chief Complaint  Patient presents with  . Follow-up    CXR was done today. She is coughing less but still having some hoarseness.  She gets SOB walking up stairs.    cough is now dry/  throat is worse off all gerd rx  Doe = MMRC1 = can walk nl pace, flat grade, can't hurry or go uphills or steps s sob   rec Please see patient coordinator before you leave today  to schedule HRCT chest  Continue zithromax daily for now  Pantoprazole (protonix) 40 mg   Take  30-60 min before first meal of the day and tagament 400  one @  Bedtime GERD diet   05/10/2016  f/u ov/[name] re: obst bronchiectasis / uacs  finished 60 days zmax one week prior to [place]   Chief Complaint  Patient presents with  . Follow-up    Not coughing much, but she c/o hoarsenss since her last visit.   spells of ? Hot flashes" occurring x sev years increasing in freq up to sev times never happens lying down and lasts 1-2secs s syncope or presyncopy, helps to close eyes  Cough better while on zmax but developed diarrhea on it/ better since stopped  Also On tagamet  twice daily  Confused with  Meds/ details of  Care  Not limited by breathing from desired activities      No obvious day to day or daytime variability or assoc excess/ purulent sputum or mucus plugs or hemoptysis    or overt sinus or hb symptoms. No unusual exp hx or h/o childhood pna/ asthma or knowledge of premature birth.  Sleeping ok without nocturnal  or early am exacerbation  of respiratory  c/o's or need for noct saba. Also denies any obvious fluctuation of symptoms with weather or environmental changes or other aggravating or alleviating factors except as outlined above   Current Medications, Allergies, Complete Past Medical History, Past Surgical History, Family History, and Social History were reviewed in [employer] record.  ROS  The following are not active complaints unless bolded sore throat, dysphagia, dental problems, itching, sneezing,  nasal congestion or excess/ purulent secretions, ear ache,   fever, chills, sweats, unintended wt loss, classically pleuritic or exertional cp,  orthopnea pnd or leg swelling, presyncope, palpitations, abdominal pain, anorexia, nausea, vomiting, diarrhea  or change in bowel or bladder habits, change in stools or urine, dysuria,hematuria,  rash, arthralgias, visual complaints, headache, numbness, weakness or ataxia or problems with walking or coordination,  change in mood/affect or memory.             Past Medical History:  BRONCHIECTASIS (ICD-494.0) see CT scan of the chest dated 09/29/2005  -  Pneumovax April 28, 2009 (over 67 so last one)  Prevnar given 04/27/2014   - Alpha one Screen sent August 18, 2010 >>>  Neg - Sinus CT August 18, 2010 >>  neg  COUGH, CHRONIC (ICD-786.2)  Left Kidney Cyst  - 04/21/09 [facility] PROCEDURE(S): ULTRASOUND GUIDED AND FLUOROSCOPIC GUIDED LEFT RENAL CYST ASPIRATION AND SCLEROSIS  LUQ Abd pain 2011...............................................................Marland KitchenSchooler  - CT ABD 07/29/10 no etiology  Objective:   Physical Exam   thin pleasant ambulatory white female in no acute distress   Vital signs reviewed > note sats 100% RA and note bp slightly elevated   wt 137 > 137 April 28, 2009 > August 18, 2010 137 >  137 09/29/2010 > 06/21/12 wt 134> 140 03/20/2013 > 04/27/2014  139> 08/10/2014  138 > 02/12/2015 136 >05/27/2015  139 > 11/24/2015 133 > 03/01/2016  130 > 05/10/2016 134   HEENT mild turbinate edema. Oropharynx no thrush or excess pnd or cobblestoning. No JVD or cervical adenopathy. Mild accessory muscle hypertrophy. Trachea midline, nl thryroid. Chest was hyperinflated by percussion with diminished breath sounds and moderate increased exp time without wheeze. Hoover sign positive at mid inspiration. Regular rate and rhythm without murmur gallop or rub or increase P2. no edema Abd: no hsm, nl excursion. Ext warm without cyanosis or clubbing.       CXR PA and Lateral:   05/10/2016 :    I personally reviewed images and agree with radiology impression as follows:   1. Stable radiographic appearance of the chest since October with multiple right lung nodules and bilateral middle lobe bronchiectasis. 2.  No new cardiopulmonary abnormality. 3.  Calcified aortic atherosclerosis.  .   Assessment & Plan:

## 2016-05-10 NOTE — Assessment & Plan Note (Addendum)
-   See CT Chest 09/29/05 Stable bronchiectasis in right middle lobe and lingula.  Scattered tree in bud opacities throughout the lungs, right greater than left,  nonspecific.   - PFT's 06/21/2012 1.67 (91%) ratio 64 and no change,  DLCO 77% - Flutter valve added 04/27/2014  - PFTs 05/24/15   FEV1 1.55 (81%) ratio 69 p 6% improvement from saba and dlco 62% ratio 88%  - HRCT 04/04/16 >>> 1. 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 2. An incidental finding of potential clinical significance has been found. Dominant nodule in the right upper lobe, likely infectious/inflammatory in etiology.  - Zmax 250 mg daily 03/01/16 - 05/01/16 improved cough at d/c but diarrhea while on it and no change in nodule RUL   Hx is very strongly suggestive of MAI in that her cough improved on zmax but certainly this is not diagnostic and the nodule of concern is still clearly evident on cxr so argument could be made for navigational bx/ BAL RUL and refer to ID once we have a specific micro dx   However, Discussed in detail all the indications, usual  risks and alternatives  relative to the benefits with patient who agrees to proceed with conservative f/u off zmax for now and continue rx for UACS (see separate a/p)    I had an extended discussion with the patient reviewing all relevant studies completed to date and  lasting 15 to 20 minutes of a 25 minute visit    Each maintenance medication was reviewed in detail including most importantly the difference between maintenance and prns and under what circumstances the prns are to be triggered using an action plan format that is not reflected in the computer generated alphabetically organized AVS.    Please see AVS for unique instructions that I personally wrote and verbalized to the the pt in detail and then reviewed with pt  by  my nurse highlighting any  changes in therapy recommended at today's visit to their plan of care.

## 2016-05-10 NOTE — Patient Instructions (Signed)
Please remember to go to the   x-ray department downstairs for your tests - we will call you with the results when they are available.     Please schedule a follow up office visit in 6 weeks, call sooner if needed  

## 2016-05-10 NOTE — Assessment & Plan Note (Signed)
U/S 04/13/2016  Dominant left lobe nodule measuring 2.1 cm needs fine-needle aspiration criteria. Biopsy  05/02/16 > non-specific but no ca   Discussed in detail all the  indications, usual  risks and alternatives  relative to the benefits with patient who agrees to proceed with endocrine referral

## 2016-05-11 DIAGNOSIS — M1612 Unilateral primary osteoarthritis, left hip: Secondary | ICD-10-CM | POA: Diagnosis not present

## 2016-05-11 NOTE — Assessment & Plan Note (Signed)
Better p rx with zmax but note also maint now on tagamet bid so no change in rx needed

## 2016-05-11 NOTE — Progress Notes (Signed)
Left detailed msg with results

## 2016-05-15 DIAGNOSIS — M810 Age-related osteoporosis without current pathological fracture: Secondary | ICD-10-CM | POA: Diagnosis not present

## 2016-06-11 NOTE — Progress Notes (Signed)
Subjective:    Patient ID: [name], female    DOB: September 21, 1932, 83 y.o.   MRN: [id number]  HPI Pt is referred by Dr Melvyn Novas, for nodular thyroid.  Pt was noted to have a nodule at the thyroid in 2017.  she is unaware of ever having had thyroid problems in the past.  she has no h/o XRT or surgery to the neck.  She has slight hoarseness sensation in the throat, and assoc cough.  Past Medical History:  Diagnosis Date  . Arm numbness left   . Back pain   . Bronchitis, chronic ([place])   . Depression   . Dizziness   . Fatigue   . Hypertension    mild  . Palpitations   . Skin cancer   . Stress   . Syncope    History of     Past Surgical History:  Procedure Laterality Date  . APPENDECTOMY    . CESAREAN SECTION  [phone]    Social History   Social History  . Marital status: Married    Spouse name: N/A  . Number of children: N/A  . Years of education: N/A   Occupational History  . Not on file.   Social History Main Topics  . Smoking status: Never Smoker  . Smokeless tobacco: Never Used  . Alcohol use Yes     Comment: social ETOH  . Drug use: No  . Sexual activity: Not on file   Other Topics Concern  . Not on file   Social History Narrative  . No narrative on file    Current Outpatient Prescriptions on File Prior to Visit  Medication Sig Dispense Refill  . acetaminophen (TYLENOL) 325 MG tablet Take 650 mg by mouth every 6 (six) hours as needed.    Marland Kitchen aspirin-acetaminophen-caffeine (EXCEDRIN MIGRAINE) 250-250-65 MG per tablet Take 1 tablet by mouth every 6 (six) hours as needed for headache.    . beta carotene w/minerals (OCUVITE) tablet Take 1 tablet by mouth daily.    Marland Kitchen BIOTIN MAXIMUM STRENGTH PO Take 1 tablet by mouth daily.    Marland Kitchen CALCIUM PO Take 1,500 mg by mouth daily.      . Cholecalciferol (VITAMIN D) 2000 UNITS CAPS Take 1 capsule by mouth daily.     Marland Kitchen dextromethorphan-guaiFENesin (MUCINEX DM) 30-600 MG per 12 hr tablet Take 1 tablet by mouth daily  as needed for cough.    Marland Kitchen HYDROcodone-acetaminophen (NORCO/VICODIN) 5-325 MG per tablet Take 1 tablet by mouth at bedtime.    . Multiple Vitamin (MULTIVITAMIN) tablet Take 1 tablet by mouth daily.      Marland Kitchen Respiratory Therapy Supplies (FLUTTER) DEVI Use as directed 1 each 0   No current facility-administered medications on file prior to visit.     Allergies  Allergen Reactions  . Ciprofloxacin Other (See Comments)    triggered a migraine that was thought to be a TIA  . Penicillins Rash  . Sulfonamide Derivatives Swelling  . Avelox [Moxifloxacin]     Rash and severe tingling/numbness   . Reclast [Zoledronic Acid]     hypertension  . Chlorpheniramine Other (See Comments)    Headache, stomach ache, bladder "not working well"    Family History  Problem Relation Age of Onset  . Atrial fibrillation Mother   . Thyroid disease Mother   . Alcohol abuse Father     BP 128/84   Pulse 73   Ht 5\' 4"  (1.626 m)   Wt 134 lb (60.8 kg)  SpO2 93%   BMI 23.00 kg/m   Review of Systems Denies weight change, visual loss, chest pain, sob, dysphagia, diarrhea, itching, flushing, depression, cold intolerance, headache, numbness, and rhinorrhea.  She has slight neck pain and easy bruising.     Objective:   Physical Exam VS: see vs page GEN: no distress HEAD: head: no deformity eyes: no periorbital swelling, no proptosis external nose and ears are normal mouth: no lesion seen NECK: left thyroid nodule is palpable.  CHEST WALL: no deformity LUNGS: clear to auscultation CV: reg rate and rhythm, no murmur ABD: abdomen is soft, nontender.  no hepatosplenomegaly.  not distended.  no hernia MUSCULOSKELETAL: muscle bulk and strength are grossly normal.  no obvious joint swelling.  gait is normal and steady EXTEMITIES: no deformity.  no ulcer on the feet.  feet are of normal color and temp.  no edema PULSES: dorsalis pedis intact bilat.  no carotid bruit NEURO:  cn 2-12 grossly intact.   readily  moves all 4's.  sensation is intact to touch on the feet SKIN:  Normal texture and temperature.  No rash or suspicious lesion is visible.   NODES:  None palpable at the neck PSYCH: alert, well-oriented.  Does not appear anxious nor depressed.   Lab Results  Component Value Date   TSH 1.432 05/11/2015   [place]: Dominant left lobe nodule measuring 2.1 cm.  1.4 cm left lower pole nodule requires annual follow-up.    bx cytol: FOLLICULAR LESION OF UNDETERMINED SIGNIFICANCE (BETHESDA CATEGORY III).  I have reviewed outside records, and summarized: Pt was noted to have goiter, and referred here.  She was seen for bronchiectasis, and has a chronic cough.      Assessment & Plan:  Cough, not thyroid-related Multinodular goiter, new to me.  I told pt there is some risk of malignancy.  We also discussed that her health is better than average, so her surgical risk if less than avg for her age..    Patient is advised the following: Patient Instructions  There is some risk of cancer, but it is low.   I would be happy to refer you to a surgery doctor, to consider having this removed.  Please let me know. If you decide not to have it removed now, please come back for a follow-up appointment in 6 months, so we can follow this.

## 2016-06-14 DIAGNOSIS — Z1231 Encounter for screening mammogram for malignant neoplasm of breast: Secondary | ICD-10-CM | POA: Diagnosis not present

## 2016-06-15 ENCOUNTER — Encounter: Payer: Self-pay | Admitting: Endocrinology

## 2016-06-15 ENCOUNTER — Ambulatory Visit (INDEPENDENT_AMBULATORY_CARE_PROVIDER_SITE_OTHER): Payer: Medicare Other | Admitting: Endocrinology

## 2016-06-15 VITALS — BP 128/84 | HR 73 | Ht 64.0 in | Wt 134.0 lb

## 2016-06-15 DIAGNOSIS — N393 Stress incontinence (female) (male): Secondary | ICD-10-CM | POA: Diagnosis not present

## 2016-06-15 DIAGNOSIS — E041 Nontoxic single thyroid nodule: Secondary | ICD-10-CM

## 2016-06-15 DIAGNOSIS — Z0000 Encounter for general adult medical examination without abnormal findings: Secondary | ICD-10-CM | POA: Diagnosis not present

## 2016-06-15 DIAGNOSIS — Z23 Encounter for immunization: Secondary | ICD-10-CM | POA: Diagnosis not present

## 2016-06-15 DIAGNOSIS — Z131 Encounter for screening for diabetes mellitus: Secondary | ICD-10-CM | POA: Diagnosis not present

## 2016-06-15 DIAGNOSIS — Z124 Encounter for screening for malignant neoplasm of cervix: Secondary | ICD-10-CM | POA: Diagnosis not present

## 2016-06-15 DIAGNOSIS — E559 Vitamin D deficiency, unspecified: Secondary | ICD-10-CM | POA: Diagnosis not present

## 2016-06-15 DIAGNOSIS — Z13220 Encounter for screening for lipoid disorders: Secondary | ICD-10-CM | POA: Diagnosis not present

## 2016-06-15 DIAGNOSIS — Z01419 Encounter for gynecological examination (general) (routine) without abnormal findings: Secondary | ICD-10-CM | POA: Diagnosis not present

## 2016-06-15 DIAGNOSIS — M810 Age-related osteoporosis without current pathological fracture: Secondary | ICD-10-CM | POA: Diagnosis not present

## 2016-06-15 DIAGNOSIS — Z130 Encounter for screening for diseases of the blood and blood-forming organs and certain disorders involving the immune mechanism: Secondary | ICD-10-CM | POA: Diagnosis not present

## 2016-06-15 NOTE — Patient Instructions (Addendum)
There is some risk of cancer, but it is low.   I would be happy to refer you to a surgery doctor, to consider having this removed.  Please let me know. If you decide not to have it removed now, please come back for a follow-up appointment in 6 months, so we can follow this.

## 2016-06-23 ENCOUNTER — Ambulatory Visit: Payer: Medicare Other | Admitting: Internal Medicine

## 2016-07-07 ENCOUNTER — Ambulatory Visit (INDEPENDENT_AMBULATORY_CARE_PROVIDER_SITE_OTHER): Payer: Medicare Other | Admitting: Internal Medicine

## 2016-07-07 ENCOUNTER — Encounter: Payer: Self-pay | Admitting: Internal Medicine

## 2016-07-07 VITALS — BP 124/72 | HR 80 | Ht 64.0 in | Wt 134.0 lb

## 2016-07-07 DIAGNOSIS — J471 Bronchiectasis with (acute) exacerbation: Secondary | ICD-10-CM

## 2016-07-07 DIAGNOSIS — R05 Cough: Secondary | ICD-10-CM

## 2016-07-07 DIAGNOSIS — R058 Other specified cough: Secondary | ICD-10-CM

## 2016-07-07 DIAGNOSIS — J479 Bronchiectasis, uncomplicated: Secondary | ICD-10-CM

## 2016-07-07 NOTE — Patient Instructions (Signed)
Tagamet should be after bfast and supper   GERD (REFLUX)  is an extremely common cause of respiratory symptoms just like yours , many times with no obvious heartburn at all.    It can be treated with medication, but also with lifestyle changes including elevation of the head of your bed (ideally with 6 inch  bed blocks),  Smoking cessation, avoidance of late meals, excessive alcohol, and avoid fatty foods, chocolate, peppermint, colas, red wine, and acidic juices such as orange juice.  NO MINT OR MENTHOL PRODUCTS SO NO COUGH DROPS   USE SUGARLESS CANDY INSTEAD (Jolley ranchers or Stover's or Life Savers) or even ice chips will also do - the key is to swallow to prevent all throat clearing. NO OIL BASED VITAMINS - use powdered substitutes.   Please schedule a follow up visit in 3 months but call sooner if needed with cxr on return

## 2016-07-07 NOTE — Progress Notes (Signed)
Subjective:    Patient ID: [name], female    DOB: [birth year]    MRN: [id number]    Primary Provider/Referring Provider: Dr Doyle Askew   Brief patient profile:  83  yowf never smoker with recurrent cough since age 83 with right middle lobe/lingular syndrome with limited bronchiectatic changes on  CT study dated 09/2005    History of Present Illness    03/01/2016  f/u ov/Wert re:  Bronchiectasis with uacs/ some better on gerd rx  Chief Complaint  Patient presents with  . Follow-up    Pt states her dry cough is unchanged since last OV. Pt states she has ocassional chest tightness and SOB but attributes this to stress. Pt also c/o hoarseness.    coughs up to an hour each am, completely non-productive even with flutter  rec No change in medications Nasty mucus > doxycline 100 mg twice daily x 10 days    Please schedule a follow up visit in 6 months but call sooner if needed  Late add for nodular opacity RUL c/w MAI    zmax x 250 mg daily x 30 days then recheck cxr       03/29/2016  f/u ov/Wert re: obst bronchiectasis / uacs maint rx zmax  Chief Complaint  Patient presents with  . Follow-up    CXR was done today. She is coughing less but still having some hoarseness.  She gets SOB walking up stairs.    cough is now dry/  throat is worse off all gerd rx  Doe = MMRC1 = can walk nl pace, flat grade, can't hurry or go uphills or steps s sob   rec Please see patient coordinator before you leave today  to schedule HRCT chest  Continue zithromax daily for now  Pantoprazole (protonix) 40 mg   Take  30-60 min before first meal of the day and tagament 400  one @  Bedtime GERD diet   05/10/2016  f/u ov/Wert re: obst bronchiectasis / uacs finished 60 days zmax one week prior to [place]   Chief Complaint  Patient presents with  . Follow-up    Not coughing much, but she c/o hoarsenss since her last visit.   spells of ? Hot flashes" occurring x sev years increasing in freq up to sev times never  happens lying down and lasts 1-2secs s syncope or presyncopy, helps to close eyes  Cough better while on zmax but developed diarrhea on it/ better since stopped  Also On tagamet  twice daily  Confused with  Meds/ details of  Care rec No change rx   07/07/2016  f/u ov/Wert re: obst brocnchiectasis/ maint rx zmax Chief Complaint  Patient presents with  . Follow-up    Pt states cough is improving but she does c/o hoarseness.    Not limited by breathing from desired activities    No obvious day to day or daytime variability or assoc excess/ purulent sputum or mucus plugs or hemoptysis or cp or chest tightness, subjective wheeze or overt sinus or hb symptoms. No unusual exp hx or h/o childhood pna/ asthma or knowledge of premature birth.  Sleeping ok without nocturnal  or early am exacerbation  of respiratory  c/o's or need for noct saba. Also denies any obvious fluctuation of symptoms with weather or environmental changes or other aggravating or alleviating factors except as outlined above   Current Medications, Allergies, Complete Past Medical History, Past Surgical History, Family History, and Social History were reviewed in [employer]  electronic medical record.  ROS  The following are not active complaints unless bolded sore throat, dysphagia, dental problems, itching, sneezing,  nasal congestion or excess/ purulent secretions, ear ache,   fever, chills, sweats, unintended wt loss, classically pleuritic or exertional cp,  orthopnea pnd or leg swelling, presyncope, palpitations, abdominal pain, anorexia, nausea, vomiting, diarrhea  or change in bowel or bladder habits, change in stools or urine, dysuria,hematuria,  rash, arthralgias, visual complaints, headache, numbness, weakness or ataxia or problems with walking or coordination,  change in mood/affect or memory.               Past Medical History:  BRONCHIECTASIS (ICD-494.0) see CT scan of the chest dated 09/29/2005  - Pneumovax  April 28, 2009 (over 83 so last one)  Prevnar given 04/27/2014   - Alpha one Screen sent August 18, 2010 >>>  Neg - Sinus CT August 18, 2010 >>  neg  COUGH, CHRONIC (ICD-786.2)  Left Kidney Cyst  - 04/21/09 [facility] PROCEDURE(S): ULTRASOUND GUIDED AND FLUOROSCOPIC GUIDED LEFT RENAL CYST ASPIRATION AND SCLEROSIS  LUQ Abd pain 2011...............................................................Marland KitchenSchooler  - CT ABD 07/29/10 no etiology        Objective:   Physical Exam   thin pleasant ambulatory min hoarse white female in no acute distress   Vital signs reviewed >  - Note on arrival 02 sats  98% on  RA    wt 137 > 137 April 28, 2009 > August 18, 2010 137 >  137 09/29/2010 > 06/21/12 wt 134> 140 03/20/2013 > 04/27/2014  139> 08/10/2014  138 > 02/12/2015 136 >05/27/2015  139 > 11/24/2015 133 > 03/01/2016  130 > 05/10/2016 134 > 07/07/2016  134    HEENT mild turbinate edema. Oropharynx no thrush or excess pnd or cobblestoning. No JVD or cervical adenopathy. Mild accessory muscle hypertrophy. Trachea midline, nl thryroid. Chest was hyperinflated by percussion with diminished breath sounds and moderate increased exp time without wheeze. Hoover sign positive at mid inspiration. Regular rate and rhythm without murmur gallop or rub or increase P2. no edema Abd: no hsm, nl excursion. Ext warm without cyanosis or clubbing.       CXR PA and Lateral:   05/10/2016 :    I personally reviewed images and agree with radiology impression as follows:   1. Stable radiographic appearance of the chest since October with multiple right lung nodules and bilateral middle lobe bronchiectasis. 2.  No new cardiopulmonary abnormality. 3.  Calcified aortic atherosclerosis.  .   Assessment & Plan:

## 2016-07-08 ENCOUNTER — Encounter: Payer: Self-pay | Admitting: Internal Medicine

## 2016-07-08 NOTE — Assessment & Plan Note (Signed)
-   See CT Chest 09/29/05 Stable bronchiectasis in right middle lobe and lingula.  Scattered tree in bud opacities throughout the lungs, right greater than left,  nonspecific.   - PFT's 06/21/2012 1.67 (91%) ratio 64 and no change,  DLCO 77% - Flutter valve added 04/27/2014  - PFTs 05/24/15   FEV1 1.55 (81%) ratio 69 p 6% improvement from saba and dlco 62% ratio 88%  - HRCT 04/04/16 >>> 1. 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 2. An incidental finding of potential clinical significance has been found. Dominant nodule in the right upper lobe, likely infectious/inflammatory in etiology.  - Zmax 250 mg daily 03/01/16 - 05/01/16 improved cough at d/c but diarrhea while on it and no change in nodule RUL > continued daily since improved cough    Overall better x for hoarseness which is likely not related to bronchiectasis but to uacs (see separate a/p)   Each maintenance medication was reviewed in detail including most importantly the difference between maintenance and as needed and under what circumstances the prns are to be used.  Please see AVS for specific  Instructions which are unique to this visit and I personally typed out  which were reviewed in detail in writing with the patient and a copy provided.

## 2016-07-08 NOTE — Assessment & Plan Note (Signed)
Trial of max gerd rx 11/24/2015 >> worse off rx 03/29/2016 > rec resume  Worse again off gerd rx > rec tagamet bid as can't tol ppi > f/u ent prn

## 2016-08-03 DIAGNOSIS — R490 Dysphonia: Secondary | ICD-10-CM | POA: Diagnosis not present

## 2016-08-03 DIAGNOSIS — M542 Cervicalgia: Secondary | ICD-10-CM | POA: Diagnosis not present

## 2016-08-03 DIAGNOSIS — M79609 Pain in unspecified limb: Secondary | ICD-10-CM | POA: Diagnosis not present

## 2016-08-03 DIAGNOSIS — E042 Nontoxic multinodular goiter: Secondary | ICD-10-CM | POA: Diagnosis not present

## 2016-08-03 DIAGNOSIS — G4452 New daily persistent headache (NDPH): Secondary | ICD-10-CM | POA: Diagnosis not present

## 2016-08-03 DIAGNOSIS — R51 Headache: Secondary | ICD-10-CM | POA: Diagnosis not present

## 2016-08-03 DIAGNOSIS — R202 Paresthesia of skin: Secondary | ICD-10-CM | POA: Diagnosis not present

## 2016-08-08 ENCOUNTER — Other Ambulatory Visit: Payer: Self-pay | Admitting: Family Medicine

## 2016-08-08 DIAGNOSIS — R51 Headache: Principal | ICD-10-CM

## 2016-08-08 DIAGNOSIS — M542 Cervicalgia: Secondary | ICD-10-CM

## 2016-08-08 DIAGNOSIS — R519 Headache, unspecified: Secondary | ICD-10-CM

## 2016-08-13 ENCOUNTER — Ambulatory Visit
Admission: RE | Admit: 2016-08-13 | Discharge: 2016-08-13 | Disposition: A | Discharge status: Home / Self Care | Payer: Medicare Other | Source: Ambulatory Visit | Attending: Family Medicine | Admitting: Family Medicine

## 2016-08-13 DIAGNOSIS — R51 Headache: Secondary | ICD-10-CM | POA: Diagnosis not present

## 2016-08-13 DIAGNOSIS — M542 Cervicalgia: Secondary | ICD-10-CM

## 2016-08-13 DIAGNOSIS — R519 Headache, unspecified: Secondary | ICD-10-CM

## 2016-08-13 DIAGNOSIS — R531 Weakness: Secondary | ICD-10-CM | POA: Diagnosis not present

## 2016-08-13 DIAGNOSIS — M4802 Spinal stenosis, cervical region: Secondary | ICD-10-CM | POA: Diagnosis not present

## 2016-09-05 DIAGNOSIS — G521 Disorders of glossopharyngeal nerve: Secondary | ICD-10-CM | POA: Diagnosis not present

## 2016-09-05 DIAGNOSIS — G500 Trigeminal neuralgia: Secondary | ICD-10-CM | POA: Diagnosis not present

## 2016-09-06 ENCOUNTER — Telehealth: Payer: Self-pay | Admitting: Endocrinology

## 2016-09-06 DIAGNOSIS — E041 Nontoxic single thyroid nodule: Secondary | ICD-10-CM

## 2016-09-06 NOTE — Telephone Encounter (Signed)
Pt would like to be referred to Dr. Armandina Gemma to have the thyroid removed please

## 2016-09-07 DIAGNOSIS — H9113 Presbycusis, bilateral: Secondary | ICD-10-CM | POA: Insufficient documentation

## 2016-09-07 DIAGNOSIS — H9313 Tinnitus, bilateral: Secondary | ICD-10-CM | POA: Insufficient documentation

## 2016-09-07 DIAGNOSIS — R05 Cough: Secondary | ICD-10-CM | POA: Diagnosis not present

## 2016-09-07 DIAGNOSIS — J383 Other diseases of vocal cords: Secondary | ICD-10-CM | POA: Diagnosis not present

## 2016-09-07 DIAGNOSIS — R490 Dysphonia: Secondary | ICD-10-CM | POA: Diagnosis not present

## 2016-09-07 NOTE — Telephone Encounter (Signed)
See message.

## 2016-09-07 NOTE — Telephone Encounter (Signed)
Done. you will receive a phone call, about a day and time for an appointment 

## 2016-09-07 NOTE — Telephone Encounter (Signed)
I contacted the patient and advised referral has been placed.  

## 2016-09-15 DIAGNOSIS — H2513 Age-related nuclear cataract, bilateral: Secondary | ICD-10-CM | POA: Diagnosis not present

## 2016-10-03 DIAGNOSIS — G521 Disorders of glossopharyngeal nerve: Secondary | ICD-10-CM | POA: Diagnosis not present

## 2016-10-03 DIAGNOSIS — G43809 Other migraine, not intractable, without status migrainosus: Secondary | ICD-10-CM | POA: Diagnosis not present

## 2016-10-03 DIAGNOSIS — G500 Trigeminal neuralgia: Secondary | ICD-10-CM | POA: Diagnosis not present

## 2016-10-06 ENCOUNTER — Ambulatory Visit: Payer: Medicare Other | Admitting: Internal Medicine

## 2016-10-09 ENCOUNTER — Ambulatory Visit (INDEPENDENT_AMBULATORY_CARE_PROVIDER_SITE_OTHER): Payer: Medicare Other | Admitting: Internal Medicine

## 2016-10-09 ENCOUNTER — Encounter: Payer: Self-pay | Admitting: Internal Medicine

## 2016-10-09 ENCOUNTER — Ambulatory Visit (INDEPENDENT_AMBULATORY_CARE_PROVIDER_SITE_OTHER)
Admission: RE | Admit: 2016-10-09 | Discharge: 2016-10-09 | Disposition: A | Discharge status: Home / Self Care | Payer: Medicare Other | Source: Ambulatory Visit | Attending: Internal Medicine | Admitting: Internal Medicine

## 2016-10-09 VITALS — BP 160/84 | HR 74 | Ht 64.0 in | Wt 134.0 lb

## 2016-10-09 DIAGNOSIS — J479 Bronchiectasis, uncomplicated: Secondary | ICD-10-CM

## 2016-10-09 DIAGNOSIS — R058 Other specified cough: Secondary | ICD-10-CM

## 2016-10-09 DIAGNOSIS — R05 Cough: Secondary | ICD-10-CM | POA: Diagnosis not present

## 2016-10-09 NOTE — Patient Instructions (Signed)
For itching sneezing or runny nose > clariton 10 mg daily   Please schedule a follow up visit in  6  months but call sooner if needed

## 2016-10-09 NOTE — Progress Notes (Signed)
Subjective:    Patient ID: [name], female    DOB: [birth year]    MRN: [id number]    Primary Provider/Referring Provider: Dr Doyle Askew   Brief patient profile:  50  yowf never smoker with recurrent cough since age 83 with right middle lobe/lingular syndrome with limited bronchiectatic changes on  CT study dated 09/2005    History of Present Illness    03/01/2016  f/u ov/[name] re:  Bronchiectasis with uacs/ some better on gerd rx  Chief Complaint  Patient presents with  . Follow-up    Pt states her dry cough is unchanged since last OV. Pt states she has ocassional chest tightness and SOB but attributes this to stress. Pt also c/o hoarseness.    coughs up to an hour each am, completely non-productive even with flutter  rec No change in medications Nasty mucus > doxycline 100 mg twice daily x 10 days    Please schedule a follow up visit in 6 months but call sooner if needed  Late add for nodular opacity RUL c/w MAI    zmax x 250 mg daily x 30 days then recheck cxr       03/29/2016  f/u ov/[name] See re: obst bronchiectasis / uacs maint rx zmax  Chief Complaint  Patient presents with  . Follow-up    CXR was done today. She is coughing less but still having some hoarseness.  She gets SOB walking up stairs.    cough is now dry/  throat is worse off all gerd rx  Doe = MMRC1 = can walk nl pace, flat grade, can't hurry or go uphills or steps s sob   rec Please see patient coordinator before you leave today  to schedule HRCT chest  Continue zithromax daily for now  Pantoprazole (protonix) 40 mg   Take  30-60 min before first meal of the day and tagament 400  one @  Bedtime GERD diet   05/10/2016  f/u ov/[name] re: obst bronchiectasis / uacs finished 60 days zmax one week prior to [place]   Chief Complaint  Patient presents with  . Follow-up    Not coughing much, but she c/o hoarsenss since her last visit.   spells of ? Hot flashes" occurring x sev years increasing in freq up to sev times never  happens lying down and lasts 1-2secs s syncope or presyncopy, helps to close eyes  Cough better while on zmax but developed diarrhea on it/ better since stopped  Also On tagamet  twice daily  Confused with  Meds/ details of  Care rec No change rx   07/07/2016  f/u ov/[name] re: obst brocnchiectasis/ maint rx zmax Chief Complaint  Patient presents with  . Follow-up    Pt states cough is improving but she does c/o hoarseness.   rec Tagamet should be after bfast and supper  GERD diet Leave off zmax    10/09/2016  f/u ov/[name] re: obstructive bronchiecatsis / no worse since off zmax  Chief Complaint  Patient presents with  . Follow-up    CXR repeated today. Pt c/o hoarseness. Her cough is about the same.    some nasal symptoms dripping nose/ sneezing worse spring   Not limited by breathing from desired activities    No obvious day to day or daytime variability or assoc excess/ purulent sputum or mucus plugs or hemoptysis or cp or chest tightness, subjective wheeze or overt  hb symptoms. No unusual exp hx or h/o childhood pna/ asthma or knowledge of  premature birth.  Sleeping ok without nocturnal  or early am exacerbation  of respiratory  c/o's or need for noct saba. Also denies any obvious fluctuation of symptoms with weather or environmental changes or other aggravating or alleviating factors except as outlined above   Current Medications, Allergies, Complete Past Medical History, Past Surgical History, Family History, and Social History were reviewed in [employer] record.  ROS  The following are not active complaints unless bolded sore throat, dysphagia, dental problems, itching, sneezing,  nasal congestion or excess/ purulent secretions, ear ache,   fever, chills, sweats, unintended wt loss, classically pleuritic or exertional cp,  orthopnea pnd or leg swelling, presyncope, palpitations, abdominal pain, anorexia, nausea, vomiting, diarrhea  or change in bowel or  bladder habits, change in stools or urine, dysuria,hematuria,  rash, arthralgias, visual complaints, headache, numbness, weakness or ataxia or problems with walking or coordination,  change in mood/affect or memory.                                   Past Medical History:  BRONCHIECTASIS (ICD-494.0) see CT scan of the chest dated 09/29/2005  - Pneumovax April 28, 2009 (over 65 so last one)  Prevnar given 04/27/2014   - Alpha one Screen sent August 18, 2010 >>>  Neg - Sinus CT August 18, 2010 >>  neg  COUGH, CHRONIC (ICD-786.2)  Left Kidney Cyst  - 04/21/09 [facility] PROCEDURE(S): ULTRASOUND GUIDED AND FLUOROSCOPIC GUIDED LEFT RENAL CYST ASPIRATION AND SCLEROSIS  LUQ Abd pain 2011...............................................................Marland KitchenSchooler  - CT ABD 07/29/10 no etiology        Objective:   Physical Exam   thin pleasant ambulatory  wf nad   Vital signs reviewed >  - Note on arrival 02 sats  96% on  RA    wt 137 > 137 April 28, 2009 > August 18, 2010 137 >  137 09/29/2010 > 06/21/12 wt 134> 140 03/20/2013 > 04/27/2014  139> 08/10/2014  138 > 02/12/2015 136 >05/27/2015  139 > 11/24/2015 133 > 03/01/2016  130 > 05/10/2016 134 > 07/07/2016  134    HEENT mild turbinate edema. Oropharynx no thrush or excess pnd or cobblestoning. No JVD or cervical adenopathy. Mild accessory muscle hypertrophy. Trachea midline, nl thryroid. Chest was hyperinflated by percussion with diminished breath sounds and moderate increased exp time without wheeze. Hoover sign positive at mid inspiration. Regular rate and rhythm without murmur gallop or rub or increase P2. no edema Abd: no hsm, nl excursion. Ext warm without cyanosis or clubbing.        CXR PA and Lateral:   10/09/2016 :    I personally reviewed images and agree with radiology impression as follows:    Unchanged appearance to the chest since prior study. Diffuse emphysematous changes, chronic bronchitic changes, and bronchiectasis. Right mid and  upper lung nodules demonstrate no change .   Assessment & Plan:

## 2016-10-10 DIAGNOSIS — E042 Nontoxic multinodular goiter: Secondary | ICD-10-CM | POA: Diagnosis not present

## 2016-10-10 DIAGNOSIS — D440 Neoplasm of uncertain behavior of thyroid gland: Secondary | ICD-10-CM | POA: Diagnosis not present

## 2016-10-11 ENCOUNTER — Other Ambulatory Visit: Payer: Self-pay | Admitting: Surgery

## 2016-10-11 DIAGNOSIS — D440 Neoplasm of uncertain behavior of thyroid gland: Secondary | ICD-10-CM

## 2016-10-13 NOTE — Assessment & Plan Note (Signed)
-   See CT Chest 09/29/05 Stable bronchiectasis in right middle lobe and lingula.  Scattered tree in bud opacities throughout the lungs, right greater than left,  nonspecific.   - PFT's 06/21/2012 1.67 (91%) ratio 64 and no change,  DLCO 77% - Flutter valve added 04/27/2014  - PFTs 05/24/15   FEV1 1.55 (81%) ratio 69 p 6% improvement from saba and dlco 62% ratio 88%  - HRCT 04/04/16 >>> 1. 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 2. An incidental finding of potential clinical significance has been found. Dominant nodule in the right upper lobe, likely infectious/inflammatory in etiology.  - Zmax 250 mg daily 03/01/16 - 05/01/16 improved cough at d/c but diarrhea while on it and no change in nodule RUL > d/c 07/07/16  No worse off zmax, no change rx need  Each maintenance medication was reviewed in detail including most importantly the difference between maintenance and as needed and under what circumstances the prns are to be used.  Please see AVS for specific  Instructions which are unique to this visit and I personally typed out  which were reviewed in detail in writing with the patient and a copy provided.

## 2016-10-13 NOTE — Assessment & Plan Note (Signed)
Ok to rx pnds with clariton and f/u prn

## 2016-10-24 ENCOUNTER — Other Ambulatory Visit (HOSPITAL_COMMUNITY)
Admission: RE | Admit: 2016-10-24 | Discharge: 2016-10-24 | Disposition: A | Discharge status: Home / Self Care | Payer: Medicare Other | Source: Ambulatory Visit | Attending: General Surgery | Admitting: General Surgery

## 2016-10-24 ENCOUNTER — Ambulatory Visit
Admission: RE | Admit: 2016-10-24 | Discharge: 2016-10-24 | Disposition: A | Discharge status: Home / Self Care | Payer: Medicare Other | Source: Ambulatory Visit | Attending: Surgery | Admitting: Surgery

## 2016-10-24 DIAGNOSIS — D440 Neoplasm of uncertain behavior of thyroid gland: Secondary | ICD-10-CM | POA: Diagnosis not present

## 2016-10-24 DIAGNOSIS — E0789 Other specified disorders of thyroid: Secondary | ICD-10-CM | POA: Diagnosis not present

## 2016-10-24 DIAGNOSIS — E041 Nontoxic single thyroid nodule: Secondary | ICD-10-CM | POA: Insufficient documentation

## 2016-11-15 ENCOUNTER — Encounter (HOSPITAL_COMMUNITY): Payer: Self-pay

## 2016-12-13 ENCOUNTER — Ambulatory Visit: Payer: Medicare Other | Admitting: Endocrinology

## 2017-01-23 DIAGNOSIS — G450 Vertebro-basilar artery syndrome: Secondary | ICD-10-CM | POA: Diagnosis not present

## 2017-04-02 ENCOUNTER — Emergency Department (HOSPITAL_COMMUNITY)
Admission: EM | Admit: 2017-04-02 | Discharge: 2017-04-02 | Disposition: A | Discharge status: Home / Self Care | Payer: Medicare Other | Attending: Emergency Medicine | Admitting: Emergency Medicine

## 2017-04-02 ENCOUNTER — Emergency Department (HOSPITAL_COMMUNITY): Payer: Medicare Other

## 2017-04-02 DIAGNOSIS — I10 Essential (primary) hypertension: Secondary | ICD-10-CM | POA: Insufficient documentation

## 2017-04-02 DIAGNOSIS — I480 Paroxysmal atrial fibrillation: Secondary | ICD-10-CM | POA: Diagnosis not present

## 2017-04-02 DIAGNOSIS — Z79899 Other long term (current) drug therapy: Secondary | ICD-10-CM | POA: Diagnosis not present

## 2017-04-02 DIAGNOSIS — I4891 Unspecified atrial fibrillation: Secondary | ICD-10-CM | POA: Diagnosis not present

## 2017-04-02 DIAGNOSIS — Z85828 Personal history of other malignant neoplasm of skin: Secondary | ICD-10-CM | POA: Insufficient documentation

## 2017-04-02 DIAGNOSIS — R110 Nausea: Secondary | ICD-10-CM | POA: Insufficient documentation

## 2017-04-02 DIAGNOSIS — R000 Tachycardia, unspecified: Secondary | ICD-10-CM | POA: Diagnosis not present

## 2017-04-02 DIAGNOSIS — Z8249 Family history of ischemic heart disease and other diseases of the circulatory system: Secondary | ICD-10-CM | POA: Insufficient documentation

## 2017-04-02 DIAGNOSIS — R079 Chest pain, unspecified: Secondary | ICD-10-CM | POA: Diagnosis not present

## 2017-04-02 LAB — HEPATIC FUNCTION PANEL
ALT: 19 U/L (ref 14–54)
AST: 28 U/L (ref 15–41)
Albumin: 3.4 g/dL — ABNORMAL LOW (ref 3.5–5.0)
Alkaline Phosphatase: 47 U/L (ref 38–126)
BILIRUBIN DIRECT: 0.1 mg/dL (ref 0.1–0.5)
BILIRUBIN INDIRECT: 0.6 mg/dL (ref 0.3–0.9)
BILIRUBIN TOTAL: 0.7 mg/dL (ref 0.3–1.2)
Total Protein: 6.3 g/dL — ABNORMAL LOW (ref 6.5–8.1)

## 2017-04-02 LAB — BASIC METABOLIC PANEL
ANION GAP: 12 (ref 5–15)
BUN: 25 mg/dL — ABNORMAL HIGH (ref 6–20)
CALCIUM: 8.8 mg/dL — AB (ref 8.9–10.3)
CO2: 22 mmol/L (ref 22–32)
Chloride: 106 mmol/L (ref 101–111)
Creatinine, Ser: 1.11 mg/dL — ABNORMAL HIGH (ref 0.44–1.00)
GFR calc Af Amer: 52 mL/min — ABNORMAL LOW (ref >60)
GFR calc non Af Amer: 45 mL/min — ABNORMAL LOW (ref >60)
GLUCOSE: 131 mg/dL — AB (ref 65–99)
Potassium: 3.9 mmol/L (ref 3.5–5.1)
Sodium: 140 mmol/L (ref 135–145)

## 2017-04-02 LAB — CBC
HEMATOCRIT: 43.2 % (ref 36.0–46.0)
HEMOGLOBIN: 14.7 g/dL (ref 12.0–15.0)
MCH: 31.8 pg (ref 26.0–34.0)
MCHC: 34 g/dL (ref 30.0–36.0)
MCV: 93.5 fL (ref 78.0–100.0)
Platelets: 224 10*3/uL (ref 150–400)
RBC: 4.62 MIL/uL (ref 3.87–5.11)
RDW: 13.6 % (ref 11.5–15.5)
WBC: 7.7 10*3/uL (ref 4.0–10.5)

## 2017-04-02 LAB — TROPONIN I: Troponin I: <0.03 ng/mL (ref <0.03)

## 2017-04-02 MED ORDER — APIXABAN 2.5 MG PO TABS
2.5000 mg | ORAL_TABLET | Freq: Two times a day (BID) | ORAL | Status: DC
  Administered 2017-04-02: 2.5 mg via ORAL
  Filled 2017-04-02: qty 1

## 2017-04-02 MED ORDER — METOPROLOL SUCCINATE ER 25 MG PO TB24: 50.0000 mg | ORAL_TABLET | Freq: Every day | ORAL | 0 refills | Status: DC

## 2017-04-02 MED ORDER — APIXABAN 2.5 MG PO TABS: 2.5000 mg | ORAL_TABLET | Freq: Two times a day (BID) | ORAL | 0 refills | Status: DC

## 2017-04-02 MED ORDER — METOPROLOL SUCCINATE ER 50 MG PO TB24
50.0000 mg | ORAL_TABLET | Freq: Every day | ORAL | Status: DC
  Administered 2017-04-02: 50 mg via ORAL
  Filled 2017-04-02: qty 1

## 2017-04-02 NOTE — Consult Note (Signed)
Cardiology Consultation:   Patient ID: [name]; [id number]; [birth year]   Admit date: 04/02/2017 Date of Consult: 04/02/2017  Primary Care Provider: Orpah Melter, MD Primary Cardiologist: Dr. Acie Fredrickson   Patient Profile:   [name] is a 83 y.o. female with a hx of palpitations, lightheadedness, syncope, and HTN who is being seen today for the evaluation of atrial fib awith RVR at the request of Dr. Vanita Panda.  History of Present Illness:   [name] presented with tachycardia. She awoke this morning at about 3:30 am with weakness, sweating, palpitations, and lightheadedness. She was able to go back to sleep. She awoke at about 6:30 with continued weakness. She has been caring for her husband who has terminal cancer and has been under considerable stress.  She felt that she could not make it up stairs to check on her husband due to weakness so she called 911.   She was given adenosine 6 mg by EMS and noted to have atrial flutter at 139 bpm. She is currently in sinus rhythm and had a brief episode of afib with RVR, few seconds.   She has had no exertional chest pain or shortness of breath. She feels that she has not been eating well and has lost about 3 pounds in the last few weeks. She is a non smoker. She drinks about 1 oz of scotch per day and at leaswt 5 cups of coffee per day to deal with the stress of caring for her husband. She feels that she is having the episodes of palpitations almost daily, but not usually lasting so long.   She was last seen in the office by Dr. Acie Fredrickson on 02/21/2016 at which times she was complaining of palpitations. She has worn a monitor in 2016, but was only able to tolerate the monitor for 2 days. She did not have any significant arrhythmias during that time. Her BP was elevated in the office, but normal at home and normal on recheck.  Past Medical History:  Diagnosis Date  . Arm numbness left   . Back pain   . Bronchitis, chronic ([place])   .  Depression   . Dizziness   . Fatigue   . Hypertension    mild  . Palpitations   . Skin cancer   . Stress   . Syncope    History of     Past Surgical History:  Procedure Laterality Date  . APPENDECTOMY    . CESAREAN SECTION  [id number]     Home Medications:  Prior to Admission medications   Medication Sig Start Date End Date Taking? Authorizing Provider  acetaminophen (TYLENOL) 325 MG tablet Take 650 mg by mouth every 6 (six) hours as needed.    [provider]  aspirin-acetaminophen-caffeine (EXCEDRIN MIGRAINE) [phone] MG per tablet Take 1 tablet by mouth every 6 (six) hours as needed for headache.    [provider]  beta carotene w/minerals (OCUVITE) tablet Take 1 tablet by mouth daily.    [provider]  BIOTIN MAXIMUM STRENGTH PO Take 1 tablet by mouth daily.    [provider]  CALCIUM PO Take 1,500 mg by mouth daily.      [provider]  Cholecalciferol (VITAMIN D) 2000 UNITS CAPS Take 1 capsule by mouth daily.     [provider]  dextromethorphan-guaiFENesin (MUCINEX DM) 30-600 MG per 12 hr tablet Take 1 tablet by mouth daily as needed for cough.    [provider]  HYDROcodone-acetaminophen (NORCO/VICODIN) 5-325  MG per tablet Take 1 tablet by mouth at bedtime.    [provider]  Multiple Vitamin (MULTIVITAMIN) tablet Take 1 tablet by mouth daily.      [provider]  Respiratory Therapy Supplies (FLUTTER) DEVI Use as directed 04/27/14   Tanda Rockers, MD    Inpatient Medications: Scheduled Meds:  Continuous Infusions:  PRN Meds:   Allergies:    Allergies  Allergen Reactions  . Ciprofloxacin Other (See Comments)    triggered a migraine that was thought to be a TIA  . Penicillins Rash  . Sulfonamide Derivatives Swelling  . Avelox [Moxifloxacin]     Rash and severe tingling/numbness   . Reclast [Zoledronic Acid]     hypertension  . Chlorpheniramine Other (See  Comments)    Headache, stomach ache, bladder "not working well"    Social History:   Social History   Social History  . Marital status: Married    Spouse name: N/A  . Number of children: N/A  . Years of education: N/A   Occupational History  . Not on file.   Social History Main Topics  . Smoking status: Never Smoker  . Smokeless tobacco: Never Used  . Alcohol use Yes     Comment: social ETOH  . Drug use: No  . Sexual activity: Not on file   Other Topics Concern  . Not on file   Social History Narrative  . No narrative on file    Family History:    Family History  Problem Relation Age of Onset  . Atrial fibrillation Mother   . Thyroid disease Mother   . Alcohol abuse Father      ROS:  Please see the history of present illness.  ROS  All other ROS reviewed and negative.     Physical Exam/Data:   Vitals:   04/02/17 0915 04/02/17 0930 04/02/17 0945 04/02/17 1000  BP: 117/63 (!) 97/47 119/65 123/64  Pulse: 78 86 75 80  Resp: 16 16 14 17   Temp:      TempSrc:      SpO2: 100% 100% 100% 100%  Weight:      Height:       No intake or output data in the 24 hours ending 04/02/17 1138 Filed Weights   04/02/17 0852  Weight: 134 lb (60.8 kg)   Body mass index is 23 kg/m.  General:  Thin, well developed, elderly female in no acute distress HEENT: normal Lymph: no adenopathy Neck: no JVD Endocrine:  No thryomegaly Vascular: No carotid bruits; FA pulses 2+ bilaterally without bruits  Cardiac:  normal S1, S2; RRR; no murmur  Lungs:  clear to auscultation bilaterally, no wheezing, rhonchi or rales  Abd: soft, nontender, no hepatomegaly  Ext: no edema Musculoskeletal:  No deformities, BUE and BLE strength normal and equal Skin: warm and dry  Neuro:  CNs 2-12 intact, no focal abnormalities noted Psych:  Normal affect   EKG:  The EKG was personally reviewed and demonstrates:  Atrial fibrillation at 139 bpm. Telemetry:  Telemetry was personally reviewed and  demonstrates:  Sinus rhythm  Relevant CV Studies:  none  Laboratory Data:  Chemistry Recent Labs Lab 04/02/17 0855  NA 140  K 3.9  CL 106  CO2 22  GLUCOSE 131*  BUN 25*  CREATININE 1.11*  CALCIUM 8.8*  GFRNONAA 45*  GFRAA 52*  ANIONGAP 12     Recent Labs Lab 04/02/17 0855  PROT 6.3*  ALBUMIN 3.4*  AST 28  ALT  19  ALKPHOS 47  BILITOT 0.7   Hematology Recent Labs Lab 04/02/17 0855  WBC 7.7  RBC 4.62  HGB 14.7  HCT 43.2  MCV 93.5  MCH 31.8  MCHC 34.0  RDW 13.6  PLT 224   Cardiac Enzymes Recent Labs Lab 04/02/17 0855  TROPONINI <0.03   No results for input(s): TROPIPOC in the last 168 hours.  BNPNo results for input(s): BNP, PROBNP in the last 168 hours.  DDimer No results for input(s): DDIMER in the last 168 hours.  Radiology/Studies:  Dg Chest 2 View  Result Date: 04/02/2017 CLINICAL DATA:  Chest pain EXAM: CHEST  2 VIEW COMPARISON:  10/09/2016, CT 04/04/2016. FINDINGS: Heart is normal size. There is hyperinflation of the lungs compatible with COPD. Interstitial prominence. Areas of nodularity in the right upper and mid lung, stable. Stable chronic peribronchial thickening. No confluent opacities or effusions. No acute bony abnormality. IMPRESSION: COPD/chronic changes. Continued nodular densities in the right lung. These appear stable. Recommend continued follow-up as recommended on prior CT from 04/04/2016. Electronically Signed   By: Rolm Baptise M.D.   On: 04/02/2017 09:38    Assessment and Plan:   1. Paroxysmal atrial fibrillation: Pt with symptomatic afib RVR early this am. Has had intermittent brief episodes of palpitations almost daily. Currently in sinus rhythm without intervention. Pt is under considerable stress in caring for her husband who has terminal cancer. Will add a beta blocker to control heart rate/rhythm. CHA2DS2/VAS Stroke Risk Score is at least 42 (age (4), female). Will initiate Eliquis 2.5 mg bid (reduced dose for age >80 and wt  60 kg, she may need dose increase if she gains wt) for stroke risk reduction. She has been seen by Dr. Acie Fredrickson with me and she is OK to discharge home. Will arrange for follow up in our office in the next 2 weeks. Pt advised to try to take some time for herself to relax, try to eat a balanced diet and reduce her caffeine intake.     For questions or updates, please contact [place] Please consult [email or web address] for contact info under Cardiology/STEMI.   Signed, Daune Perch, NP  04/02/2017 11:38 AM   Attending Note:   The patient was seen and examined.  Agree with assessment and plan as noted above.  Changes made to the above note as needed.  Patient seen and independently examined with Pecolia Ades, NP.   We discussed all aspects of the encounter. I agree with the assessment and plan as stated above.   1.  PAF:   Truman Hayward presents with an episode of  PAF She is under lots of stress with the recent dramatic decline in the health of her husband. She is not eating well,  Not getting much rest.  The episode has resolved. CHADS 2 VASC is 3 ( age, female) Will start her on Elliqui 2.5 mg BID ( age 6, Wt = 60 kg)  Start toprol XL 50 mg a day   Will see her back in the office.   Will need to get an echo  Check TSH  She is stable and can be discharged from the ER    I have spent a total of 40 minutes with patient reviewing hospital  notes , telemetry, EKGs, labs and examining patient as well as establishing an assessment and plan that was discussed with the patient. > 50% of time was spent in direct patient care.    Thayer Headings, Brooke Bonito., MD, [facility] 04/02/2017, 1:41  PM [address]  [place] Pager [phone]

## 2017-04-02 NOTE — ED Notes (Signed)
Per Pt she drinks 1 vanilla boost per day and 5 cups of caffinated coffee per day. 2 cups in the morning with rest spread out throughout the day

## 2017-04-02 NOTE — Discharge Instructions (Signed)
Follow up as scheduled with Dr. Acie Fredrickson.   Return if any problems.

## 2017-04-02 NOTE — ED Provider Notes (Signed)
[facility] EMERGENCY DEPARTMENT Provider Note   CSN: [id number] Arrival date & time: 04/02/17  0844     History   Chief Complaint Chief Complaint  Patient presents with  . Tachycardia    HPI [name] is a 83 y.o. female.  The history is provided by the patient. No language interpreter was used.  Chest Pain   This is a new problem. The problem occurs constantly. The problem has been rapidly improving. The pain is associated with walking. The pain is moderate. The quality of the pain is described as pressure-like. The pain radiates to the left arm. Duration of episode(s) is 3 hours. Associated symptoms include nausea and weakness. She has tried nothing for the symptoms. The treatment provided significant relief. There are no known risk factors.  Her family medical history is significant for CAD.  Procedure history is negative for cardiac catheterization.    Past Medical History:  Diagnosis Date  . Arm numbness left   . Back pain   . Bronchitis, chronic ([place])   . Depression   . Dizziness   . Fatigue   . Hypertension    mild  . Palpitations   . Skin cancer   . Stress   . Syncope    History of     Patient Active Problem List   Diagnosis Date Noted  . Thyroid nodule 04/06/2016  . Upper airway cough syndrome 11/24/2015  . Near syncope 03/02/2015  . Mastoiditis of both sides 08/10/2014  . Palpitations   . Syncope   . Dizziness   . Hypertension   . Back pain   . Depression   . Stress   . Fatigue   . Arm numbness left   . Skin cancer   . Bronchitis, chronic ([place])   . Obstructive bronchiectasis ([place]) 08/28/2007    Past Surgical History:  Procedure Laterality Date  . APPENDECTOMY    . CESAREAN SECTION  [phone]    OB History    No data available       Home Medications    Prior to Admission medications   Medication Sig Start Date End Date Taking? Authorizing Provider  acetaminophen (TYLENOL) 325 MG tablet Take 650 mg  by mouth every 6 (six) hours as needed.    [provider]  aspirin-acetaminophen-caffeine (EXCEDRIN MIGRAINE) [phone] MG per tablet Take 1 tablet by mouth every 6 (six) hours as needed for headache.    [provider]  beta carotene w/minerals (OCUVITE) tablet Take 1 tablet by mouth daily.    [provider]  BIOTIN MAXIMUM STRENGTH PO Take 1 tablet by mouth daily.    [provider]  CALCIUM PO Take 1,500 mg by mouth daily.      [provider]  Cholecalciferol (VITAMIN D) 2000 UNITS CAPS Take 1 capsule by mouth daily.     [provider]  dextromethorphan-guaiFENesin (MUCINEX DM) 30-600 MG per 12 hr tablet Take 1 tablet by mouth daily as needed for cough.    [provider]  HYDROcodone-acetaminophen (NORCO/VICODIN) 5-325 MG per tablet Take 1 tablet by mouth at bedtime.    [provider]  Multiple Vitamin (MULTIVITAMIN) tablet Take 1 tablet by mouth daily.      [provider]  Respiratory Therapy Supplies (FLUTTER) DEVI Use as directed 04/27/14   Tanda Rockers, MD    Family History Family History  Problem Relation Age of Onset  . Atrial fibrillation Mother   . Thyroid disease Mother   .  Alcohol abuse Father     Social History Social History  Substance Use Topics  . Smoking status: Never Smoker  . Smokeless tobacco: Never Used  . Alcohol use Yes     Comment: social ETOH     Allergies   Ciprofloxacin; Penicillins; Sulfonamide derivatives; Avelox [moxifloxacin]; Reclast [zoledronic acid]; and Chlorpheniramine   Review of Systems Review of Systems  Cardiovascular: Positive for chest pain.  Gastrointestinal: Positive for nausea.  Neurological: Positive for weakness.  All other systems reviewed and are negative.    Physical Exam Updated Vital Signs BP 123/64   Pulse 80   Temp 97.6 F (36.4 C) (Oral)   Resp 17   Ht 5\' 4"  (1.626 m)   Wt 60.8 kg (134 lb)   SpO2 100%   BMI 23.00 kg/m    Physical Exam  Constitutional: She is oriented to person, place, and time. She appears well-developed and well-nourished.  HENT:  Head: Normocephalic.  Mouth/Throat: Oropharynx is clear and moist.  Eyes: EOM are normal.  Neck: Normal range of motion.  Cardiovascular: Normal rate, regular rhythm, normal heart sounds and intact distal pulses.   Pulmonary/Chest: Effort normal.  Abdominal: She exhibits no distension.  Musculoskeletal: Normal range of motion.  Neurological: She is alert and oriented to person, place, and time.  Psychiatric: She has a normal mood and affect.  Nursing note and vitals reviewed.    ED Treatments / Results  Labs (all labs ordered are listed, but only abnormal results are displayed) Labs Reviewed  BASIC METABOLIC PANEL - Abnormal; Notable for the following:       Result Value   Glucose, Bld 131 (*)    BUN 25 (*)    Creatinine, Ser 1.11 (*)    Calcium 8.8 (*)    GFR calc non Af Amer 45 (*)    GFR calc Af Amer 52 (*)    All other components within normal limits  HEPATIC FUNCTION PANEL - Abnormal; Notable for the following:    Total Protein 6.3 (*)    Albumin 3.4 (*)    All other components within normal limits  CBC  TROPONIN I    EKG  EKG Interpretation  Date/Time:  Monday April 02 2017 08:49:41 EDT Ventricular Rate:  139 PR Interval:    QRS Duration: 120 QT Interval:  326 QTC Calculation: 487 R Axis:   108 Text Interpretation:  Atrial fibrillation Artifact Abnormal ekg Confirmed by Carmin Muskrat [phone]) on 04/02/2017 8:59:24 AM       Radiology Dg Chest 2 View  Result Date: 04/02/2017 CLINICAL DATA:  Chest pain EXAM: CHEST  2 VIEW COMPARISON:  10/09/2016, CT 04/04/2016. FINDINGS: Heart is normal size. There is hyperinflation of the lungs compatible with COPD. Interstitial prominence. Areas of nodularity in the right upper and mid lung, stable. Stable chronic peribronchial thickening. No confluent opacities or effusions. No acute  bony abnormality. IMPRESSION: COPD/chronic changes. Continued nodular densities in the right lung. These appear stable. Recommend continued follow-up as recommended on prior CT from 04/04/2016. Electronically Signed   By: Rolm Baptise M.D.   On: 04/02/2017 09:38    Procedures Procedures (including critical care time)  Medications Ordered in ED Medications - No data to display   Initial Impression / Assessment and Plan / ED Course  I have reviewed the triage vital signs and the nursing notes.  Pertinent labs & imaging results that were available during my care of the patient were reviewed by me and considered in my medical  decision making (see chart for details).     Pt given adenosine 6mg  by EMS.  Pt captured in aflutter at 139.    Pt feels better.  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  Pt reports she has to go home.   I will consult cardiology for treatment.  Final Clinical Impressions(s) / ED Diagnoses   Final diagnoses:  Atrial fibrillation with rapid ventricular response ([place])    New Prescriptions New Prescriptions   No medications on file  I spoke to Engineer, maintenance (IT).  They will have cardiology see and assist with a plan for patient.   Fransico Meadow, PA-C 04/02/17 1129    Carmin Muskrat, MD 04/02/17 [phone]

## 2017-04-02 NOTE — ED Triage Notes (Signed)
Pt was up at 72 helping husband who is bedridden and experienced "hot sweat" and felt pain in chest area and radiating down left arm. Pt brought in my ems with a-fib on strip, 6 adenosine given, 325 asa given.

## 2017-04-02 NOTE — ED Notes (Signed)
Cardiology consult at bedside.

## 2017-04-02 NOTE — ED Notes (Signed)
Help get patient undress on the monitor did ekg shown to Dr Vanita Panda patient is resting with call bell in reach

## 2017-04-02 NOTE — ED Notes (Signed)
Patient transported to X-ray 

## 2017-04-02 NOTE — ED Notes (Signed)
ED Provider at bedside. 

## 2017-04-10 ENCOUNTER — Ambulatory Visit: Payer: Medicare Other | Admitting: Internal Medicine

## 2017-04-16 ENCOUNTER — Encounter: Payer: Self-pay | Admitting: Cardiovascular Disease

## 2017-04-16 ENCOUNTER — Ambulatory Visit (INDEPENDENT_AMBULATORY_CARE_PROVIDER_SITE_OTHER): Payer: Medicare Other | Admitting: Cardiovascular Disease

## 2017-04-16 VITALS — BP 158/76 | HR 85 | Ht 64.0 in | Wt 132.0 lb

## 2017-04-16 DIAGNOSIS — I480 Paroxysmal atrial fibrillation: Secondary | ICD-10-CM | POA: Diagnosis not present

## 2017-04-16 NOTE — Patient Instructions (Addendum)
Medication Instructions:  Your physician recommends that you continue on your current medications as directed. Please refer to the Current Medication list given to you today.   Labwork: Your physician recommends that you return for lab work in: 1 month on December 13 - you do not have to fast You may come in anytime between 8 am and 4:30 pm   Testing/Procedures: None Ordered   Follow-Up: Your physician wants you to follow-up in: 6 months with Dr. Acie Fredrickson.  You will receive a reminder letter in the mail two months in advance. If you don't receive a letter, please call our office to schedule the follow-up appointment.   If you need a refill on your cardiac medications before your next appointment, please call your pharmacy.   Thank you for choosing [facility]! Christen Bame, RN [phone]

## 2017-04-16 NOTE — Progress Notes (Signed)
[name]  Date of Birth  1932-07-09 [facility] [address] [place], [place]  [address] [phone]  Fax  [phone]   P roblem list: 1. Palpitations 2. History of syncope 3. Dizziness 4. Mild hypertension  History of Present Illness:  [name] is an elderly female with a history of palpitations and history of dizziness. She has mild hypertension. She's done very well since I last saw her. She has not had any episodes of chest pain or shortness of breath. She has had some leg discomfort particularly on busy days.  Sept. 27, 2016: [name] is seen back for office visit. Has had some lightheadedness on occasion .  Lasts for a few seconds  Occurs at various times, not associated with any particular activity .  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   Feb. 1, 2017 Has been under lots of stress,  Husband had neck surgery and it was complicated by some paralysis  Has been running back and forth to [facility] (Dr. Carloyn Manner did the surgery)    Sept. 18, 2017:  [name] is seen back for follow up visit. Has palpitations. She will wore a monitor but was only able to tolerate for 2 days. She did not have any significant arrhythmias during this 2 days of monitoring.  Bp is elevated here BP readings at home are normal at home  Palpitations seem to be well controlled.   Nov. 12, 2018:    [name] is seen today  Was seen 2 weeks ago in the ER with rapid Afib.  Her husband passed away 1 week ago .  BP is a bit high  No additional episodes of atrial fibrillation  Current Outpatient Medications on File Prior to Visit  Medication Sig Dispense Refill  . apixaban (ELIQUIS) 2.5 MG TABS tablet Take 1 tablet (2.5 mg total) by mouth 2 (two) times daily. 60 tablet 0  . HYDROcodone-acetaminophen (NORCO/VICODIN) 5-325 MG per tablet Take 1 tablet by mouth at bedtime.    . metoprolol succinate (TOPROL-XL) 25 MG 24  hr tablet Take 2 tablets (50 mg total) by mouth daily. 30 tablet 0   No current facility-administered medications on file prior to visit.     Allergies  Allergen Reactions  . Ciprofloxacin Other (See Comments)    triggered a migraine that was thought to be a TIA  . Penicillins Rash  . Sulfonamide Derivatives Swelling  . Avelox [Moxifloxacin]     Rash and severe tingling/numbness   . Reclast [Zoledronic Acid]     hypertension  . Chlorpheniramine Other (See Comments)    Headache, stomach ache, bladder "not working well"    Past Medical History:  Diagnosis Date  . Arm numbness left   . Back pain   . Bronchitis, chronic ([place])   . Depression   . Dizziness   . Fatigue   . Hypertension    mild  . Palpitations   . Skin cancer   . Stress   . Syncope    History of     Past Surgical History:  Procedure Laterality Date  . APPENDECTOMY    . CESAREAN SECTION  [phone]    Social History   Tobacco Use  Smoking Status Never Smoker  Smokeless Tobacco Never Used    Social History   Substance and Sexual Activity  Alcohol Use Yes   Comment: social ETOH    Family  History  Problem Relation Age of Onset  . Atrial fibrillation Mother   . Thyroid disease Mother   . Alcohol abuse Father     Reviw of Systems:  Reviewed in the HPI.  All other systems are negative.  Physical Exam: Blood pressure (!) 158/76, pulse 85, height 5\' 4"  (1.626 m), weight 132 lb (59.9 kg), SpO2 91 %.  GEN:  Well nourished, well developed in no acute distress HEENT: Normal NECK: No JVD; No carotid bruits LYMPHATICS: No lymphadenopathy CARDIAC: RR, no murmurs, rubs, gallops RESPIRATORY:  Clear to auscultation without rales, wheezing or rhonchi  ABDOMEN: Soft, non-tender, non-distended MUSCULOSKELETAL:  No edema; No deformity  SKIN: Warm and dry NEUROLOGIC:  Alert and oriented x 3    ECG:     Assessment / Plan:   1.  Atrial fibrillation- the presented with paroxysmal A. fib to the  emergency room several weeks ago.  [name]  had been under significant stress with her critically ill husband.  Her husband is now passed away.  She still has not taken any significant time off.  Fortunately, she remains in normal sinus rhythm. Will continue to follow  Continue Eliquis.  We will check a CBC and a basic medical profile in 1 month.  I will see her in 6 months for follow-up visit.   2. History of syncope -    Mertie Moores, MD  04/16/2017 4:29 PM    [address],  [place], [place]  [address] Pager [phone] Phone: [phone]; Fax: [phone]

## 2017-05-08 DIAGNOSIS — M47812 Spondylosis without myelopathy or radiculopathy, cervical region: Secondary | ICD-10-CM | POA: Diagnosis not present

## 2017-06-18 DIAGNOSIS — Z1231 Encounter for screening mammogram for malignant neoplasm of breast: Secondary | ICD-10-CM | POA: Diagnosis not present

## 2017-06-21 DIAGNOSIS — Z124 Encounter for screening for malignant neoplasm of cervix: Secondary | ICD-10-CM | POA: Diagnosis not present

## 2017-06-21 DIAGNOSIS — N393 Stress incontinence (female) (male): Secondary | ICD-10-CM | POA: Diagnosis not present

## 2017-06-21 DIAGNOSIS — N8111 Cystocele, midline: Secondary | ICD-10-CM | POA: Diagnosis not present

## 2017-06-21 DIAGNOSIS — R05 Cough: Secondary | ICD-10-CM | POA: Diagnosis not present

## 2017-06-21 DIAGNOSIS — K219 Gastro-esophageal reflux disease without esophagitis: Secondary | ICD-10-CM | POA: Diagnosis not present

## 2017-06-29 ENCOUNTER — Telehealth: Payer: Self-pay | Admitting: Cardiovascular Disease

## 2017-06-29 MED ORDER — METOPROLOL SUCCINATE ER 25 MG PO TB24: 25.0000 mg | ORAL_TABLET | Freq: Every day | ORAL | 3 refills | Status: DC

## 2017-06-29 NOTE — Telephone Encounter (Signed)
May be afib HR and BP are low Agree with encouraging fluids and some extra salt Decrease Toprol XL to 25 mg a day She may take an extra 25 mg as needed   Cal [place] back in a week or so

## 2017-06-29 NOTE — Telephone Encounter (Signed)
States HR was fast all night and then when she woke up this morning she felt very weak. She does not know what her heart rate was but she finally fell asleep. States she took Toprol XL 50 mg this morning and HR is currently 56-78 bpm and BP is 100/62 mmHg. States she currently has a headache and feels weak. I verified that she is taking her eliquis. I encouraged her to eat something salty now to help her BP increase and that I will forward message to Dr. Acie Fredrickson for further advice. I advised I will call her back with his advice. She verbalized understanding and thanked me for the call.

## 2017-06-29 NOTE — Telephone Encounter (Signed)
New message  Patient calling with concerns of palpitations that started last night. Please  call  Patient c/o Palpitations:  High priority if patient c/o lightheadedness, shortness of breath, or chest pain  1) How long have you had palpitations/irregular HR/ Afib? Are you having the symptoms now? yes  2) Are you currently experiencing lightheadedness, SOB or CP?No  3) Do you have a history of afib (atrial fibrillation) or irregular heart rhythm? yes  4) Have you checked your BP or HR? (document readings if available): 100/62  5) Are you experiencing any other symptoms? weak

## 2017-06-29 NOTE — Telephone Encounter (Signed)
Reviewed Dr. Elmarie Shiley advice with patient who verbalized understanding. She denies recent increase in caffeine or other stiumulants. She asks if an appointment is needed and I advised her that her follow-up is scheduled for May and to call back if she needs a sooner appointment. I advised her to monitor HR and BP and to call back to report. She thanked me for the call.

## 2017-08-14 DIAGNOSIS — Z6822 Body mass index (BMI) 22.0-22.9, adult: Secondary | ICD-10-CM | POA: Diagnosis not present

## 2017-08-14 DIAGNOSIS — G450 Vertebro-basilar artery syndrome: Secondary | ICD-10-CM | POA: Diagnosis not present

## 2017-08-14 DIAGNOSIS — M47812 Spondylosis without myelopathy or radiculopathy, cervical region: Secondary | ICD-10-CM | POA: Diagnosis not present

## 2017-08-28 ENCOUNTER — Ambulatory Visit (INDEPENDENT_AMBULATORY_CARE_PROVIDER_SITE_OTHER)
Admission: RE | Admit: 2017-08-28 | Discharge: 2017-08-28 | Disposition: A | Discharge status: Home / Self Care | Payer: Medicare Other | Source: Ambulatory Visit | Attending: Internal Medicine | Admitting: Internal Medicine

## 2017-08-28 ENCOUNTER — Encounter: Payer: Self-pay | Admitting: Internal Medicine

## 2017-08-28 ENCOUNTER — Ambulatory Visit (INDEPENDENT_AMBULATORY_CARE_PROVIDER_SITE_OTHER): Payer: Medicare Other | Admitting: Internal Medicine

## 2017-08-28 VITALS — BP 132/68 | HR 85 | Ht 64.0 in | Wt 132.6 lb

## 2017-08-28 DIAGNOSIS — R058 Other specified cough: Secondary | ICD-10-CM

## 2017-08-28 DIAGNOSIS — J479 Bronchiectasis, uncomplicated: Secondary | ICD-10-CM | POA: Diagnosis not present

## 2017-08-28 DIAGNOSIS — R05 Cough: Secondary | ICD-10-CM | POA: Diagnosis not present

## 2017-08-28 DIAGNOSIS — J984 Other disorders of lung: Secondary | ICD-10-CM | POA: Diagnosis not present

## 2017-08-28 NOTE — Progress Notes (Signed)
Subjective:    Patient ID: [name], female    DOB: 31-Jan-1933    MRN: [id number]    Primary Provider/Referring Provider: Dr Doyle Askew   Brief patient profile:  29  yowf never smoker with recurrent cough since age 84 with right middle lobe/lingular syndrome with limited bronchiectatic changes on  CT study dated 09/2005    History of Present Illness    03/01/2016  f/u ov/[name] re:  Bronchiectasis with uacs/ some better on gerd rx  Chief Complaint  Patient presents with  . Follow-up    Pt states her dry cough is unchanged since last OV. Pt states she has ocassional chest tightness and SOB but attributes this to stress. Pt also c/o hoarseness.    coughs up to an hour each am, completely non-productive even with flutter  rec No change in medications Nasty mucus > doxycline 100 mg twice daily x 10 days    Please schedule a follow up visit in 6 months but call sooner if needed  Late add for nodular opacity RUL c/w MAI    zmax x 250 mg daily x 30 days then recheck cxr       03/29/2016  f/u ov/[name] re: obst bronchiectasis / uacs maint rx zmax  Chief Complaint  Patient presents with  . Follow-up    CXR was done today. She is coughing less but still having some hoarseness.  She gets SOB walking up stairs.    cough is now dry/  throat is worse off all gerd rx  Doe = MMRC1 = can walk nl pace, flat grade, can't hurry or go uphills or steps s sob   rec Please see patient coordinator before you leave today  to schedule HRCT chest  Continue zithromax daily for now  Pantoprazole (protonix) 40 mg   Take  30-60 min before first meal of the day and tagament 400  one @  Bedtime GERD diet   05/10/2016  f/u ov/[name] re: obst bronchiectasis / uacs finished 60 days zmax one week prior to [place]   Chief Complaint  Patient presents with  . Follow-up    Not coughing much, but she c/o hoarsenss since her last visit.   spells of ? Hot flashes" occurring x sev years increasing in freq up to sev times never  happens lying down and lasts 1-2secs s syncope or presyncopy, helps to close eyes  Cough better while on zmax but developed diarrhea on it/ better since stopped  Also On tagamet  twice daily  Confused with  Meds/ details of  Care rec No change rx   07/07/2016  f/u ov/[name] re: obst bronchiectasis/ maint rx zmax Chief Complaint  Patient presents with  . Follow-up    Pt states cough is improving but she does c/o hoarseness.   rec Tagamet should be after bfast and supper  GERD diet Leave off zmax       08/28/2017  f/u ov/[name] re: obst bronchiectasis  Chief Complaint  Patient presents with  . Follow-up    coughs all the time productive only in the morning and hoase all the time,  SOB , chest tightness and in her upper back at times,    Dyspnea:  MMRC1 = can walk nl pace, flat grade, can't hurry or go uphills or steps s sob   Cough: transient green mucus one day prior to OV  / no recent abx  Sleep: no resp issues  SABA use: n/a    No obvious day to  day or daytime variability or assoc consistently excess/ purulent sputum or mucus plugs or hemoptysis or cp or chest tightness, subjective wheeze or overt sinus or hb symptoms. No unusual exposure hx or h/o childhood pna/ asthma or knowledge of premature birth.  Sleeping ok flat without nocturnal  or early am exacerbation  of respiratory  c/o's or need for noct saba. Also denies any obvious fluctuation of symptoms with weather or environmental changes or other aggravating or alleviating factors except as outlined above   Current Allergies, Complete Past Medical History, Past Surgical History, Family History, and Social History were reviewed in [employer] record.  ROS  The following are not active complaints unless bolded Hoarseness, sore throat, dysphagia, dental problems, itching, sneezing,  nasal congestion or discharge of excess mucus or purulent secretions, ear ache,   fever, chills, sweats, unintended wt loss or  wt gain, classically pleuritic or exertional cp,  orthopnea pnd or leg swelling, presyncope, palpitations, abdominal pain, anorexia, nausea, vomiting, diarrhea  or change in bowel habits or change in bladder habits, change in stools or change in urine, dysuria, hematuria,  rash, arthralgias, visual complaints, headache, numbness, weakness or ataxia or problems with walking or coordination,  change in mood/affect or memory.        Current Meds  Medication Sig  . apixaban (ELIQUIS) 2.5 MG TABS tablet Take 1 tablet (2.5 mg total) by mouth 2 (two) times daily.  Marland Kitchen HYDROcodone-acetaminophen (NORCO/VICODIN) 5-325 MG per tablet Take 1 tablet by mouth at bedtime.  . metoprolol succinate (TOPROL XL) 25 MG 24 hr tablet Take 1 tablet (25 mg total) by mouth daily. May take additional 1/2 to 1 tablet as needed for pulse > 110 bpm                  Past Medical History:  BRONCHIECTASIS (ICD-494.0) see CT scan of the chest dated 09/29/2005  - Pneumovax April 28, 2009 (over 65 so last one)  Prevnar given 04/27/2014   - Alpha one Screen sent August 18, 2010 >>>  Neg - Sinus CT August 18, 2010 >>  neg  COUGH, CHRONIC (ICD-786.2)  Left Kidney Cyst  - 04/21/09 [facility] PROCEDURE(S): ULTRASOUND GUIDED AND FLUOROSCOPIC GUIDED LEFT RENAL CYST ASPIRATION AND SCLEROSIS  LUQ Abd pain 2011...............................................................Marland KitchenSchooler  - CT ABD 07/29/10 no etiology        Objective:   Physical Exam   thin pleasant wf nad   Vital signs reviewed - Note on arrival 02 sats 96% on RA    wt 137 > 137 April 28, 2009 > August 18, 2010 137 >  137 09/29/2010 > 06/21/12 wt 134> 140 03/20/2013 > 04/27/2014  139> 08/10/2014  138 > 02/12/2015 136 >05/27/2015  139 > 11/24/2015 133 > 03/01/2016  130 > 05/10/2016 134 > 07/07/2016  134 > 08/28/2017  132  HEENT: nl dentition, turbinates bilaterally, and oropharynx. Nl external ear canals without cough reflex   NECK :  without JVD/Nodes/TM/ nl carotid upstrokes  bilaterally   LUNGS: no acc muscle use,  Nl contour chest with minimal insp pops and squeaks on insp/ min exp rhonchi bilaterally    CV:  RRR  no s3 or murmur or increase in P2, and no edema   ABD:  soft and nontender with nl inspiratory excursion in the supine position. No bruits or organomegaly appreciated, bowel sounds nl  MS:  Nl gait/ ext warm without deformities, calf tenderness, cyanosis or clubbing No obvious joint restrictions   SKIN: warm  and dry without lesions    NEURO:  alert, approp, nl sensorium with  no motor or cerebellar deficits apparent.     CXR PA and Lateral:   08/28/2017 :    I personally reviewed images and agree with radiology impression as follows:   1. Diffuse peribronchial cuffing and scattered areas of nodularity corresponding to areas of mucoid impaction in bronchiectatic regions on prior chest CT 04/04/2016. No definite new acute findings are noted on today's examination. 2. Aortic atherosclerosis.      Assessment & Plan:

## 2017-08-28 NOTE — Patient Instructions (Addendum)
Take zpak if the mucus stays nasty  Try taking protonix 40 mg Take 30-60 min before first meal of the day   GERD (REFLUX)  is an extremely common cause of respiratory symptoms just like yours , many times with no obvious heartburn at all.    It can be treated with medication, but also with lifestyle changes including elevation of the head of your bed (ideally with 6 inch  bed blocks),  Smoking cessation, avoidance of late meals, excessive alcohol, and avoid fatty foods, chocolate, peppermint, colas, red wine, and acidic juices such as orange juice.  NO MINT OR MENTHOL PRODUCTS SO NO COUGH DROPS   USE SUGARLESS CANDY INSTEAD (Jolley ranchers or Stover's or Life Savers) or even ice chips will also do - the key is to swallow to prevent all throat clearing. NO OIL BASED VITAMINS - use powdered substitutes.  Please remember to go to the  x-ray department downstairs in the basement  for your tests - we will call you with the results when they are available.      Please schedule a follow up visit in 3 months but call sooner if needed

## 2017-08-29 NOTE — Progress Notes (Signed)
Spoke with pt and notified of results per Dr. Wert. Pt verbalized understanding and denied any questions. 

## 2017-09-02 ENCOUNTER — Encounter: Payer: Self-pay | Admitting: Internal Medicine

## 2017-09-02 NOTE — Assessment & Plan Note (Addendum)
-   See CT Chest 09/29/05 Stable bronchiectasis in right middle lobe and lingula.  Scattered tree in bud opacities throughout the lungs, right greater than left,  nonspecific.   - PFT's 06/21/2012 1.67 (91%) ratio 64 and no change,  DLCO 77% - Flutter valve added 04/27/2014  - PFTs 05/24/15   FEV1 1.55 (81%) ratio 69 p 6% improvement from saba and dlco 62% ratio 88%  - HRCT 04/04/16 >>> 1. 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 2. An incidental finding of potential clinical significance has been found. Dominant nodule in the right upper lobe, likely infectious/inflammatory in etiology.  - Zmax 250 mg daily 03/01/16 - 05/01/16 improved cough at d/c but diarrhea while on it and no change in nodule RUL > d/c 07/07/16  - 08/28/2017 added back cycles of zpak prn flare of purulent bronchitis      This is an extremely common benign condition in the elderly and does not warrant aggressive eval/ rx at this point unless there is a clinical correlation suggesting unaddressed pulmonary infection (purulent sputum, night sweats, unintended wt loss, doe) or evolution of  obvious changes on plain cxr (as opposed to serial CT, which is way over sensitive to make clinical decisions re intervention and treatment in the elderly, who tend to tolerate both dx and treatment poorly) .   Discussed various options with pt and agrees to try more aggressive rx for gerd and cycles of zpak prn flare of purulent sputum  I had an extended discussion with the patient reviewing all relevant studies completed to date and  lasting 15 to 20 minutes of a 25 minute visit    Each maintenance medication was reviewed in detail including most importantly the difference between maintenance and prns and under what circumstances the prns are to be triggered using an action plan format that is not  reflected in the computer generated alphabetically organized AVS.    Please see AVS for specific instructions unique to this visit that I personally wrote and verbalized to the the pt in detail and then reviewed with pt  by my nurse highlighting any  changes in therapy recommended at today's visit to their plan of care.

## 2017-09-02 NOTE — Assessment & Plan Note (Signed)
Trial of max gerd rx 11/24/2015 >> worse off rx 03/29/2016 and 08/28/2017 > resume ppi qam ac as may also contribute to bronchiectasis flares / hoarseness to which she is prone

## 2017-09-18 DIAGNOSIS — H2513 Age-related nuclear cataract, bilateral: Secondary | ICD-10-CM | POA: Diagnosis not present

## 2017-09-27 ENCOUNTER — Other Ambulatory Visit: Payer: Self-pay | Admitting: Surgery

## 2017-09-27 DIAGNOSIS — D440 Neoplasm of uncertain behavior of thyroid gland: Secondary | ICD-10-CM

## 2017-09-27 DIAGNOSIS — H9113 Presbycusis, bilateral: Secondary | ICD-10-CM | POA: Diagnosis not present

## 2017-09-27 DIAGNOSIS — H903 Sensorineural hearing loss, bilateral: Secondary | ICD-10-CM | POA: Diagnosis not present

## 2017-09-27 DIAGNOSIS — H6993 Unspecified Eustachian tube disorder, bilateral: Secondary | ICD-10-CM | POA: Insufficient documentation

## 2017-09-27 DIAGNOSIS — H6983 Other specified disorders of Eustachian tube, bilateral: Secondary | ICD-10-CM | POA: Diagnosis not present

## 2017-09-29 DIAGNOSIS — M545 Low back pain: Secondary | ICD-10-CM | POA: Diagnosis not present

## 2017-09-29 DIAGNOSIS — M7062 Trochanteric bursitis, left hip: Secondary | ICD-10-CM | POA: Diagnosis not present

## 2017-10-08 ENCOUNTER — Ambulatory Visit
Admission: RE | Admit: 2017-10-08 | Discharge: 2017-10-08 | Disposition: A | Discharge status: Home / Self Care | Payer: Medicare Other | Source: Ambulatory Visit | Attending: Surgery | Admitting: Surgery

## 2017-10-08 DIAGNOSIS — E042 Nontoxic multinodular goiter: Secondary | ICD-10-CM | POA: Diagnosis not present

## 2017-10-08 DIAGNOSIS — D440 Neoplasm of uncertain behavior of thyroid gland: Secondary | ICD-10-CM | POA: Diagnosis not present

## 2017-10-16 DIAGNOSIS — H2511 Age-related nuclear cataract, right eye: Secondary | ICD-10-CM | POA: Diagnosis not present

## 2017-10-16 DIAGNOSIS — H04123 Dry eye syndrome of bilateral lacrimal glands: Secondary | ICD-10-CM | POA: Diagnosis not present

## 2017-10-16 DIAGNOSIS — H2512 Age-related nuclear cataract, left eye: Secondary | ICD-10-CM | POA: Diagnosis not present

## 2017-10-30 ENCOUNTER — Ambulatory Visit: Payer: Medicare Other | Admitting: Cardiovascular Disease

## 2017-11-14 DIAGNOSIS — H2511 Age-related nuclear cataract, right eye: Secondary | ICD-10-CM | POA: Diagnosis not present

## 2017-11-14 DIAGNOSIS — H25811 Combined forms of age-related cataract, right eye: Secondary | ICD-10-CM | POA: Diagnosis not present

## 2017-11-26 DIAGNOSIS — E042 Nontoxic multinodular goiter: Secondary | ICD-10-CM | POA: Diagnosis not present

## 2017-11-30 ENCOUNTER — Ambulatory Visit (INDEPENDENT_AMBULATORY_CARE_PROVIDER_SITE_OTHER): Payer: Medicare Other | Admitting: Internal Medicine

## 2017-11-30 ENCOUNTER — Encounter: Payer: Self-pay | Admitting: Internal Medicine

## 2017-11-30 ENCOUNTER — Other Ambulatory Visit: Payer: Self-pay | Admitting: Surgery

## 2017-11-30 VITALS — BP 130/70 | HR 75 | Ht 62.0 in | Wt 131.0 lb

## 2017-11-30 DIAGNOSIS — J479 Bronchiectasis, uncomplicated: Secondary | ICD-10-CM

## 2017-11-30 DIAGNOSIS — E042 Nontoxic multinodular goiter: Secondary | ICD-10-CM

## 2017-11-30 DIAGNOSIS — R058 Other specified cough: Secondary | ICD-10-CM

## 2017-11-30 DIAGNOSIS — E041 Nontoxic single thyroid nodule: Secondary | ICD-10-CM | POA: Diagnosis not present

## 2017-11-30 DIAGNOSIS — R05 Cough: Secondary | ICD-10-CM

## 2017-11-30 NOTE — Assessment & Plan Note (Signed)
-   See CT Chest 09/29/05 Stable bronchiectasis in right middle lobe and lingula.  Scattered tree in bud opacities throughout the lungs, right greater than left,  nonspecific.   - PFT's 06/21/2012 1.67 (91%) ratio 64 and no change,  DLCO 77% - Flutter valve added 04/27/2014  - PFTs 05/24/15   FEV1 1.55 (81%) ratio 69 p 6% improvement from saba and dlco 62% ratio 88%  - HRCT 04/04/16 >>> 1. 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 2. An incidental finding of potential clinical significance has been found. Dominant nodule in the right upper lobe, likely infectious/inflammatory in etiology.  - Zmax 250 mg daily 03/01/16 - 05/01/16 improved cough at d/c but diarrhea while on it and no change in nodule RUL > d/c 07/07/16 - 08/28/2017 added back cycles of zpak prn flare of purulent bronchitis   Adequate control on present rx, reviewed in detail with pt > no change in rx needed  > see avs for instructions unique to this ov

## 2017-11-30 NOTE — Patient Instructions (Signed)
Next time you start coughing for any reason >  mucinex dm up to 1200 mg every 12 hours and the flutter valve and take Try prilosec otc 20mg   Take 30-60 min before first meal of the day and Pepcid ac (famotidine) 20 mg one @  bedtime until cough is completely gone for at least a week without the need for cough suppression     GERD (REFLUX)  is an extremely common cause of respiratory symptoms just like yours , many times with no obvious heartburn at all.    It can be treated with medication, but also with lifestyle changes including elevation of the head of your bed (ideally with 6 inch  bed blocks),  Smoking cessation, avoidance of late meals, excessive alcohol, and avoid fatty foods, chocolate, peppermint, colas, red wine, and acidic juices such as orange juice.  NO MINT OR MENTHOL PRODUCTS SO NO COUGH DROPS   USE SUGARLESS CANDY INSTEAD (Jolley ranchers or Stover's or Life Savers) or even ice chips will also do - the key is to swallow to prevent all throat clearing. NO OIL BASED VITAMINS - use powdered substitutes.    .Please schedule a follow up visit in 6  months but call sooner if needed

## 2017-11-30 NOTE — Progress Notes (Signed)
Subjective:    Patient ID: [name], female    DOB: 07-26-1932    MRN: [id number]    Primary Provider/Referring Provider: Dr Doyle Askew   Brief patient profile:  84  yowf never smoker with recurrent cough since age 84 with right middle lobe/lingular syndrome with limited bronchiectatic changes on  CT study dated 09/2005    History of Present Illness    03/01/2016  f/u ov/Kianah Harries re:  Bronchiectasis with uacs/ some better on gerd rx  Chief Complaint  Patient presents with  . Follow-up    Pt states her dry cough is unchanged since last OV. Pt states she has ocassional chest tightness and SOB but attributes this to stress. Pt also c/o hoarseness.    coughs up to an hour each am, completely non-productive even with flutter  rec No change in medications Nasty mucus > doxycline 100 mg twice daily x 10 days    Please schedule a follow up visit in 6 months but call sooner if needed  Late add for nodular opacity RUL c/w MAI    zmax x 250 mg daily x 30 days then recheck cxr       03/29/2016  f/u ov/Stacy Deshler re: obst bronchiectasis / uacs maint rx zmax  Chief Complaint  Patient presents with  . Follow-up    CXR was done today. She is coughing less but still having some hoarseness.  She gets SOB walking up stairs.    cough is now dry/  throat is worse off all gerd rx  Doe = MMRC1 = can walk nl pace, flat grade, can't hurry or go uphills or steps s sob   rec Please see patient coordinator before you leave today  to schedule HRCT chest  Continue zithromax daily for now  Pantoprazole (protonix) 40 mg   Take  30-60 min before first meal of the day and tagament 400  one @  Bedtime GERD diet   05/10/2016  f/u ov/Akasia Ahmad re: obst bronchiectasis / uacs finished 60 days zmax one week prior to [place]   Chief Complaint  Patient presents with  . Follow-up    Not coughing much, but she c/o hoarsenss since her last visit.   spells of ? Hot flashes" occurring x sev years increasing in freq up to sev times never  happens lying down and lasts 1-2secs s syncope or presyncopy, helps to close eyes  Cough better while on zmax but developed diarrhea on it/ better since stopped  Also On tagamet  twice daily  Confused with  Meds/ details of  Care rec No change rx   07/07/2016  f/u ov/Pietra Zuluaga re: obst bronchiectasis/ maint rx zmax Chief Complaint  Patient presents with  . Follow-up    Pt states cough is improving but she does c/o hoarseness.   rec Tagamet should be after bfast and supper  GERD diet Leave off zmax       08/28/2017  f/u ov/Avalene Sealy re: obst bronchiectasis  Chief Complaint  Patient presents with  . Follow-up    coughs all the time productive only in the morning and hoase all the time,  SOB , chest tightness and in her upper back at times,   Dyspnea:  MMRC1 = can walk nl pace, flat grade, can't hurry or go uphills or steps s sob   Cough: transient green mucus one day prior to OV  / no recent abx  Sleep: no resp issues  SABA use: n/a  rec Take zpak if the mucus stays  nasty Try taking protonix 40 mg Take 30-60 min before first meal of the day  GERD diet  Please remember to go to the  x-ray department downstairs in the basement  for your tests - we will call you with the results when they are available.    11/30/2017  f/u ov/Evens Meno re: bronchiectasis / improved cough on gerd diet/ no longer on ppi  Chief Complaint  Patient presents with  . Follow-up    Cough has improved.    Dyspnea: improved / up steps now better  Cough: sporadic daytime/  s excess/ purulent sputum or mucus plugs    SABA use: none 02: none     No obvious day to day or daytime variability or assoc excess/ purulent sputum or mucus plugs or hemoptysis or cp or chest tightness, subjective wheeze or overt sinus or hb symptoms.   Sleeping: 2 pillows ok  without nocturnal  or early am exacerbation  of respiratory  c/o's or need for noct saba. Also denies any obvious fluctuation of symptoms with weather or environmental  changes or other aggravating or alleviating factors except as outlined above   No unusual exposure hx or h/o childhood pna/ asthma or knowledge of premature birth.  Current Allergies, Complete Past Medical History, Past Surgical History, Family History, and Social History were reviewed in [employer] record.  ROS  The following are not active complaints unless bolded Hoarseness, sore throat, dysphagia, dental problems, itching, sneezing,  nasal congestion or discharge of excess mucus or purulent secretions, ear ache,   fever, chills, sweats, unintended wt loss or wt gain, classically pleuritic or exertional cp,  orthopnea pnd or arm/hand swelling  or leg swelling, presyncope, palpitations, abdominal pain, anorexia, nausea, vomiting, diarrhea  or change in bowel habits or change in bladder habits, change in stools or change in urine, dysuria, hematuria,  rash, arthralgias, visual complaints, headache, numbness, weakness or ataxia or problems with walking or coordination,  change in mood or  memory.        Current Meds  Medication Sig  . HYDROcodone-acetaminophen (NORCO/VICODIN) 5-325 MG per tablet Take 1 tablet by mouth at bedtime.  . prednisoLONE acetate (PRED FORTE) 1 % ophthalmic suspension As directed                     Past Medical History:  BRONCHIECTASIS (ICD-494.0) see CT scan of the chest dated 09/29/2005  - Pneumovax April 28, 2009 (over 65 so last one)  Prevnar given 04/27/2014   - Alpha one Screen sent August 18, 2010 >>>  Neg - Sinus CT August 18, 2010 >>  neg  COUGH, CHRONIC (ICD-786.2)  Left Kidney Cyst  - 04/21/09 [facility] PROCEDURE(S): ULTRASOUND GUIDED AND FLUOROSCOPIC GUIDED LEFT RENAL CYST ASPIRATION AND SCLEROSIS  LUQ Abd pain 2011...............................................................Marland KitchenSchooler  - CT ABD 07/29/10 no etiology        Objective:   Physical Exam  amb wf nad   Vital signs reviewed - Note on arrival 02 sats  100% on RA        wt 137 > 137 April 28, 2009 > August 18, 2010 137 >  137 09/29/2010 > 06/21/12 wt 134> 140 03/20/2013 > 04/27/2014  139> 08/10/2014  138 > 02/12/2015 136 >05/27/2015  139 > 11/24/2015 133 > 03/01/2016  130 > 05/10/2016 134 > 07/07/2016  134 > 08/28/2017  132> 11/30/2017    131      HEENT: nl dentition, turbinates bilaterally, and oropharynx. Nl external ear  canals without cough reflex   NECK :  without JVD/Nodes/TM/ nl carotid upstrokes bilaterally   LUNGS: no acc muscle use,  Nl contour chest  Very minimal insp pops/squeaks bilaterally without cough on insp or exp maneuvers   CV:  RRR  no s3 or murmur or increase in P2, and no edema   ABD:  soft and nontender with nl inspiratory excursion in the supine position. No bruits or organomegaly appreciated, bowel sounds nl  MS:  Nl gait/ ext warm without deformities, calf tenderness, cyanosis or clubbing No obvious joint restrictions   SKIN: warm and dry without lesions    NEURO:  alert, approp, nl sensorium with  no motor or cerebellar deficits apparent.          Assessment & Plan:

## 2017-11-30 NOTE — Assessment & Plan Note (Signed)
U/S 04/13/2016  Dominant left lobe nodule measuring 2.1 cm needs fine-needle aspiration criteria. Biopsy  05/02/16 > non-specific but no ca> referred to endocrine 05/10/2016 > for possible thryoid surgery Dr Harlow Asa 12/2017    should do fine with thryoid surgery if needed / no pulmonary clearance required prior to surgery

## 2017-11-30 NOTE — Assessment & Plan Note (Signed)
Trial of max gerd rx 11/24/2015 >> worse off rx 03/29/2016 and 08/28/2017 > resume ppi qam ac> resolved as of 11/30/2017    Of the three most common causes of  Sub-acute / recurrent or chronic cough, only one (GERD)  can actually contribute to/ trigger  the other two (asthma and post nasal drip syndrome)  and perpetuate the cylce of cough.  While not intuitively obvious, many patients with chronic low grade reflux do not cough until there is a primary insult that disturbs the protective epithelial barrier and exposes sensitive nerve endings.   This is typically viral but can due to PNDS and  either may apply here.   The point is that once this occurs, it is difficult to eliminate the cycle  using anything but a maximally effective acid suppression regimen at least in the short run, accompanied by an appropriate diet to address non acid GERD and control / eliminate the cough itself for at least 3 days with mucinex dm and flutter valve and then f/u here if not better  O/w f/u can be q 6 months.   I had an extended discussion with the patient reviewing all relevant studies completed to date and  lasting 15 to 20 minutes of a 25 minute visit     Each maintenance medication was reviewed in detail including most importantly the difference between maintenance and prns and under what circumstances the prns are to be triggered using an action plan format that is not reflected in the computer generated alphabetically organized AVS.    Please see AVS for specific instructions unique to this visit that I personally wrote and verbalized to the the pt in detail and then reviewed with pt  by my nurse highlighting any  changes in therapy recommended at today's visit to their plan of care.

## 2017-12-13 ENCOUNTER — Other Ambulatory Visit: Payer: Self-pay | Admitting: Surgery

## 2017-12-13 ENCOUNTER — Ambulatory Visit
Admission: RE | Admit: 2017-12-13 | Discharge: 2017-12-13 | Disposition: A | Discharge status: Home / Self Care | Payer: Medicare Other | Source: Ambulatory Visit | Attending: Surgery | Admitting: Surgery

## 2017-12-13 DIAGNOSIS — E042 Nontoxic multinodular goiter: Secondary | ICD-10-CM

## 2017-12-13 DIAGNOSIS — E041 Nontoxic single thyroid nodule: Secondary | ICD-10-CM | POA: Diagnosis not present

## 2018-01-08 DIAGNOSIS — G450 Vertebro-basilar artery syndrome: Secondary | ICD-10-CM | POA: Diagnosis not present

## 2018-01-08 DIAGNOSIS — M47812 Spondylosis without myelopathy or radiculopathy, cervical region: Secondary | ICD-10-CM | POA: Diagnosis not present

## 2018-01-08 DIAGNOSIS — Z6822 Body mass index (BMI) 22.0-22.9, adult: Secondary | ICD-10-CM | POA: Diagnosis not present

## 2018-01-18 DIAGNOSIS — H2512 Age-related nuclear cataract, left eye: Secondary | ICD-10-CM | POA: Diagnosis not present

## 2018-01-23 DIAGNOSIS — H2512 Age-related nuclear cataract, left eye: Secondary | ICD-10-CM | POA: Diagnosis not present

## 2018-01-23 DIAGNOSIS — H25812 Combined forms of age-related cataract, left eye: Secondary | ICD-10-CM | POA: Diagnosis not present

## 2018-02-12 ENCOUNTER — Encounter

## 2018-02-12 ENCOUNTER — Encounter: Payer: Self-pay | Admitting: Cardiovascular Disease

## 2018-02-12 ENCOUNTER — Ambulatory Visit (INDEPENDENT_AMBULATORY_CARE_PROVIDER_SITE_OTHER): Payer: Medicare Other | Admitting: Cardiovascular Disease

## 2018-02-12 VITALS — BP 120/70 | HR 76 | Ht 62.0 in | Wt 130.2 lb

## 2018-02-12 DIAGNOSIS — I480 Paroxysmal atrial fibrillation: Secondary | ICD-10-CM | POA: Diagnosis not present

## 2018-02-12 NOTE — Progress Notes (Signed)
[name]  Date of Birth  03-18-1933 [facility] [address] [place], [place]  [address] [phone]  Fax  [phone]   P roblem list: 1. Palpitations 2. History of syncope 3. Dizziness 4. Mild hypertension    [name] is an elderly female with a history of palpitations and history of dizziness. She has mild hypertension. She's done very well since I last saw her. She has not had any episodes of chest pain or shortness of breath. She has had some leg discomfort particularly on busy days.  Sept. 27, 2016: [name] is seen back for office visit. Has had some lightheadedness on occasion .  Lasts for a few seconds  Occurs at various times, not associated with any particular activity .  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   Feb. 1, 2017 Has been under lots of stress,  Husband had neck surgery and it was complicated by some paralysis  Has been running back and forth to [facility] (Dr. Carloyn Manner did the surgery)    Sept. 18, 2017:  [name] is seen back for follow up visit. Has palpitations. She will wore a monitor but was only able to tolerate for 2 days. She did not have any significant arrhythmias during this 2 days of monitoring.  Bp is elevated here BP readings at home are normal at home  Palpitations seem to be well controlled.   Nov. 12, 2018:    [name] is seen today  Was seen 2 weeks ago in the ER with rapid Afib.  Her husband passed away 1 week ago .  BP is a bit high  No additional episodes of atrial fibrillation  February 12, 2018: Seen today for follow-up visit.  She has a history of paroxysmal atrial fibrillation. Has chronic bronchiactasis - sees Dr. Melvyn Novas.  No further episodes of Afib    Current Outpatient Medications on File Prior to Visit  Medication Sig Dispense Refill  . HYDROcodone-acetaminophen (NORCO/VICODIN) 5-325 MG per tablet Take 1 tablet by mouth at bedtime.     . prednisoLONE acetate (PRED FORTE) 1 % ophthalmic suspension As directed  1   No current facility-administered medications on file prior to visit.     Allergies  Allergen Reactions  . Ciprofloxacin Other (See Comments)    triggered a migraine that was thought to be a TIA  . Penicillins Rash  . Sulfonamide Derivatives Swelling  . Avelox [Moxifloxacin]     Rash and severe tingling/numbness   . Reclast [Zoledronic Acid]     hypertension  . Chlorpheniramine Other (See Comments)    Headache, stomach ache, bladder "not working well"    Past Medical History:  Diagnosis Date  . Arm numbness left   . Back pain   . Bronchitis, chronic ([place])   . Depression   . Dizziness   . Fatigue   . Hypertension    mild  . Palpitations   . Skin cancer   . Stress   . Syncope    History of     Past Surgical History:  Procedure Laterality Date  . APPENDECTOMY    . CESAREAN SECTION  [phone]    Social History   Tobacco Use  Smoking Status Never Smoker  Smokeless Tobacco Never Used    Social History   Substance and Sexual Activity  Alcohol Use Yes   Comment: social ETOH    Family History  Problem Relation Age of Onset  . Atrial fibrillation Mother   . Thyroid disease Mother   . Alcohol abuse Father     Reviw of Systems:  Reviewed in the HPI.  All other systems are negative.  Physical Exam: Blood pressure 120/70, pulse 76, height 5\' 2"  (1.575 m), weight 130 lb 3.2 oz (59.1 kg), SpO2 90 %.  GEN:   Elderly female  HEENT: Normal NECK: No JVD; No carotid bruits LYMPHATICS: No lymphadenopathy CARDIAC: RRR, no murmurs, rubs, gallops RESPIRATORY:  Clear to auscultation without rales, wheezing or rhonchi  ABDOMEN: Soft, non-tender, non-distended MUSCULOSKELETAL:  No edema; No deformity  SKIN: Warm and dry NEUROLOGIC:  Alert and oriented x 3   ECG:    NSR at 76.   RAD,  Borderline ECG    Assessment / Plan:   1.  Paroxysmal Atrial fibrillation-  No recurrent  episodes of Af .  Under lots of stress  Will see her in 1 year     2. History of syncope -    Mertie Moores, MD  02/12/2018 4:46 PM    [address],  [place], [place]  [address] Pager [phone] Phone: [phone]; Fax: [phone]

## 2018-02-12 NOTE — Patient Instructions (Signed)

## 2018-04-12 ENCOUNTER — Emergency Department (HOSPITAL_COMMUNITY)
Admission: EM | Admit: 2018-04-12 | Discharge: 2018-04-12 | Disposition: A | Discharge status: Home / Self Care | Payer: Medicare Other | Attending: Emergency Medicine | Admitting: Emergency Medicine

## 2018-04-12 ENCOUNTER — Encounter (HOSPITAL_COMMUNITY): Payer: Self-pay

## 2018-04-12 ENCOUNTER — Emergency Department (HOSPITAL_COMMUNITY): Payer: Medicare Other

## 2018-04-12 DIAGNOSIS — R55 Syncope and collapse: Secondary | ICD-10-CM | POA: Diagnosis not present

## 2018-04-12 DIAGNOSIS — Z79899 Other long term (current) drug therapy: Secondary | ICD-10-CM | POA: Diagnosis not present

## 2018-04-12 DIAGNOSIS — Z85828 Personal history of other malignant neoplasm of skin: Secondary | ICD-10-CM | POA: Insufficient documentation

## 2018-04-12 DIAGNOSIS — I10 Essential (primary) hypertension: Secondary | ICD-10-CM | POA: Insufficient documentation

## 2018-04-12 DIAGNOSIS — R110 Nausea: Secondary | ICD-10-CM

## 2018-04-12 DIAGNOSIS — R531 Weakness: Secondary | ICD-10-CM | POA: Diagnosis not present

## 2018-04-12 DIAGNOSIS — R42 Dizziness and giddiness: Secondary | ICD-10-CM | POA: Insufficient documentation

## 2018-04-12 DIAGNOSIS — R51 Headache: Secondary | ICD-10-CM | POA: Diagnosis not present

## 2018-04-12 DIAGNOSIS — R112 Nausea with vomiting, unspecified: Secondary | ICD-10-CM | POA: Diagnosis not present

## 2018-04-12 DIAGNOSIS — R0789 Other chest pain: Secondary | ICD-10-CM

## 2018-04-12 DIAGNOSIS — R109 Unspecified abdominal pain: Secondary | ICD-10-CM | POA: Insufficient documentation

## 2018-04-12 DIAGNOSIS — R918 Other nonspecific abnormal finding of lung field: Secondary | ICD-10-CM | POA: Diagnosis not present

## 2018-04-12 LAB — BASIC METABOLIC PANEL
ANION GAP: 8 (ref 5–15)
BUN: 25 mg/dL — ABNORMAL HIGH (ref 8–23)
CALCIUM: 8.5 mg/dL — AB (ref 8.9–10.3)
CO2: 26 mmol/L (ref 22–32)
CREATININE: 0.79 mg/dL (ref 0.44–1.00)
Chloride: 106 mmol/L (ref 98–111)
GFR calc Af Amer: >60 mL/min (ref >60)
GLUCOSE: 102 mg/dL — AB (ref 70–99)
Potassium: 3.8 mmol/L (ref 3.5–5.1)
Sodium: 140 mmol/L (ref 135–145)

## 2018-04-12 LAB — URINALYSIS, ROUTINE W REFLEX MICROSCOPIC
Bacteria, UA: NONE SEEN
Bilirubin Urine: NEGATIVE
GLUCOSE, UA: NEGATIVE mg/dL
Hgb urine dipstick: NEGATIVE
KETONES UR: 5 mg/dL — AB
Nitrite: NEGATIVE
PH: 7 (ref 5.0–8.0)
Protein, ur: NEGATIVE mg/dL
SPECIFIC GRAVITY, URINE: 1.015 (ref 1.005–1.030)

## 2018-04-12 LAB — HEPATIC FUNCTION PANEL
ALT: 18 U/L (ref 0–44)
AST: 20 U/L (ref 15–41)
Albumin: 3.5 g/dL (ref 3.5–5.0)
Alkaline Phosphatase: 49 U/L (ref 38–126)
BILIRUBIN INDIRECT: 0.5 mg/dL (ref 0.3–0.9)
Bilirubin, Direct: 0.1 mg/dL (ref 0.0–0.2)
TOTAL PROTEIN: 6.1 g/dL — AB (ref 6.5–8.1)
Total Bilirubin: 0.6 mg/dL (ref 0.3–1.2)

## 2018-04-12 LAB — CBC
HCT: 41.9 % (ref 36.0–46.0)
Hemoglobin: 13.8 g/dL (ref 12.0–15.0)
MCH: 31.2 pg (ref 26.0–34.0)
MCHC: 32.9 g/dL (ref 30.0–36.0)
MCV: 94.6 fL (ref 80.0–100.0)
PLATELETS: 236 10*3/uL (ref 150–400)
RBC: 4.43 MIL/uL (ref 3.87–5.11)
RDW: 12.8 % (ref 11.5–15.5)
WBC: 8.2 10*3/uL (ref 4.0–10.5)
nRBC: 0 % (ref 0.0–0.2)

## 2018-04-12 LAB — TROPONIN I: Troponin I: <0.03 ng/mL (ref <0.03)

## 2018-04-12 LAB — LIPASE, BLOOD: LIPASE: 40 U/L (ref 11–51)

## 2018-04-12 LAB — CBG MONITORING, ED: Glucose-Capillary: 85 mg/dL (ref 70–99)

## 2018-04-12 MED ORDER — ONDANSETRON HCL 4 MG/2ML IJ SOLN
4.0000 mg | Freq: Once | INTRAMUSCULAR | Status: AC
  Administered 2018-04-12: 4 mg via INTRAVENOUS
  Filled 2018-04-12: qty 2

## 2018-04-12 MED ORDER — ASPIRIN 81 MG PO CHEW
324.0000 mg | CHEWABLE_TABLET | Freq: Once | ORAL | Status: AC
  Administered 2018-04-12: 324 mg via ORAL
  Filled 2018-04-12: qty 4

## 2018-04-12 MED ORDER — MECLIZINE HCL 25 MG PO TABS
25.0000 mg | ORAL_TABLET | Freq: Once | ORAL | Status: AC
  Administered 2018-04-12: 25 mg via ORAL
  Filled 2018-04-12: qty 1

## 2018-04-12 NOTE — ED Notes (Signed)
Urine sample obtained. Patient ambulated to RR without assistance.

## 2018-04-12 NOTE — ED Notes (Signed)
Bed: HU31 Expected date:  Expected time:  Means of arrival:  Comments: 84 yo near syncope

## 2018-04-12 NOTE — ED Triage Notes (Addendum)
Patient arrived via [facility] from work. Patient was sitting at her computer desk. Patient got dizzy, clammy, light headed and had a near syncope episode. Denies chest pain & shob. Stroke screen negative per ems. NSR. Orthostatic negative per ems.   Hx. A.fib

## 2018-04-12 NOTE — ED Provider Notes (Signed)
[facility] DEPT Provider Note   CSN: [id number] Arrival date & time: 04/12/18  1641     History   Chief Complaint Chief Complaint  Patient presents with  . Near Syncope    HPI [name] is a 84 y.o. female.  84 year old female with past medical history including hypertension, chronic bronchitis, depression who presents with multiple complaints.  Earlier today prior to arrival, she was sitting at her desk doing work when she began having heaviness in her neck and shoulders.  She has had this several times previously, usually related to working on her computer, and has seen a neurologist in the past for her neck.  She then began feeling like she could not focus her eyes and started feeling dizzy and lightheaded.  Coworker noted that she felt clammy. She felt like she may pass out but she never had syncope/LOC.  She has had a gradual onset of headache.  She then had some nausea and vomiting.  Currently she still feels nauseated.  They eventually called EMS for her to be brought here and since arriving here she has had some mild left-sided heaviness that goes from her left abdomen to her left chest.  The heaviness is not associated with any shortness of breath.  She notes some epigastric pain that she has had previously.  She denies any fevers, recent illness, urinary symptoms, or change in her medications recently.  The history is provided by the patient.  Near Syncope     Past Medical History:  Diagnosis Date  . Arm numbness left   . Back pain   . Bronchitis, chronic ([place])   . Depression   . Dizziness   . Fatigue   . Hypertension    mild  . Palpitations   . Skin cancer   . Stress   . Syncope    History of     Patient Active Problem List   Diagnosis Date Noted  . Thyroid nodule 04/06/2016  . Upper airway cough syndrome 11/24/2015  . Near syncope 03/02/2015  . Mastoiditis of both sides 08/10/2014  . Palpitations   . Syncope   .  Dizziness   . Hypertension   . Back pain   . Depression   . Stress   . Fatigue   . Arm numbness left   . Skin cancer   . Bronchitis, chronic ([place])   . Obstructive bronchiectasis ([place]) 08/28/2007    Past Surgical History:  Procedure Laterality Date  . APPENDECTOMY    . CESAREAN SECTION  [phone]     OB History   None      Home Medications    Prior to Admission medications   Medication Sig Start Date End Date Taking? Authorizing Provider  acetaminophen (TYLENOL) 500 MG tablet Take 1,000 mg by mouth every 6 (six) hours as needed for mild pain.   Yes [provider]  HYDROcodone-acetaminophen (NORCO/VICODIN) 5-325 MG per tablet Take 1 tablet by mouth at bedtime.   Yes [provider]    Family History Family History  Problem Relation Age of Onset  . Atrial fibrillation Mother   . Thyroid disease Mother   . Alcohol abuse Father     Social History Social History   Tobacco Use  . Smoking status: Never Smoker  . Smokeless tobacco: Never Used  Substance Use Topics  . Alcohol use: Yes    Comment: social ETOH  . Drug use: No     Allergies   Ciprofloxacin; Penicillins;  Sulfonamide derivatives; Avelox [moxifloxacin]; Reclast [zoledronic acid]; and Chlorpheniramine   Review of Systems Review of Systems  Cardiovascular: Positive for near-syncope.   All other systems reviewed and are negative except that which was mentioned in HPI   Physical Exam Updated Vital Signs BP (!) 170/65 (BP Location: Right Arm)   Pulse 63   Temp 97.6 F (36.4 C) (Oral)   Resp 16   Ht 5\' 4"  (1.626 m)   Wt 58.5 kg   SpO2 100%   BMI 22.14 kg/m   Physical Exam  Constitutional: She is oriented to person, place, and time. She appears well-developed and well-nourished. No distress.  HENT:  Head: Normocephalic and atraumatic.  Moist mucous membranes  Eyes: Pupils are equal, round, and reactive to light. Conjunctivae and EOM are normal.  Neck: Neck supple.    Cardiovascular: Normal rate, regular rhythm and normal heart sounds.  No murmur heard. Pulmonary/Chest: Effort normal and breath sounds normal.  Abdominal: Soft. Bowel sounds are normal. She exhibits no distension. There is tenderness (mild epigastric). There is no rebound and no guarding.  Musculoskeletal: She exhibits no edema.  Neurological: She is alert and oriented to person, place, and time.  Fluent speech 5/5 strength x all 4 ext  Skin: Skin is warm and dry.  Psychiatric: She has a normal mood and affect. Judgment normal.  Nursing note and vitals reviewed.    ED Treatments / Results  Labs (all labs ordered are listed, but only abnormal results are displayed) Labs Reviewed  BASIC METABOLIC PANEL - Abnormal; Notable for the following components:      Result Value   Glucose, Bld 102 (*)    BUN 25 (*)    Calcium 8.5 (*)    All other components within normal limits  URINALYSIS, ROUTINE W REFLEX MICROSCOPIC - Abnormal; Notable for the following components:   Ketones, ur 5 (*)    Leukocytes, UA TRACE (*)    All other components within normal limits  HEPATIC FUNCTION PANEL - Abnormal; Notable for the following components:   Total Protein 6.1 (*)    All other components within normal limits  CBC  TROPONIN I  LIPASE, BLOOD  TROPONIN I  CBG MONITORING, ED    EKG EKG Interpretation  Date/Time:  Friday April 12 2018 17:01:00 EST Ventricular Rate:  68 PR Interval:    QRS Duration: 111 QT Interval:  407 QTC Calculation: 433 R Axis:   66 Text Interpretation:  Sinus rhythm RSR' in V1 or V2, right VCD or RVH Converted from A fib to sinus rhythm Confirmed by Theotis Burrow [phone]) on 04/12/2018 5:10:36 PM   Radiology Dg Chest 2 View  Result Date: 04/12/2018 CLINICAL DATA:  84 y/o F; episode of dizziness, clamminess, lightheadedness, and near syncope. EXAM: CHEST - 2 VIEW COMPARISON:  08/28/2017 chest radiograph. 04/04/2016 CT chest. FINDINGS: Normal cardiac silhouette.  Stable reticular and nodular opacities concentrated in the right mid lung zone. No consolidation. No pleural effusion or pneumothorax. Right upper lobe pulmonary nodule on prior study is obscured by the EKG leads. No acute osseous abnormality is evident. IMPRESSION: Stable reticular and nodular opacities concentrated in the right mid lung zone. No acute pulmonary process identified. Electronically Signed   By: Kristine Garbe M.D.   On: 04/12/2018 18:37    Procedures Procedures (including critical care time)  Medications Ordered in ED Medications  aspirin chewable tablet 324 mg (324 mg Oral Given 04/12/18 1818)  ondansetron (ZOFRAN) injection 4 mg (4 mg Intravenous Given  04/12/18 1817)  meclizine (ANTIVERT) tablet 25 mg (25 mg Oral Given 04/12/18 1818)     Initial Impression / Assessment and Plan / ED Course  I have reviewed the triage vital signs and the nursing notes.  Pertinent labs & imaging results that were available during my care of the patient were reviewed by me and considered in my medical decision making (see chart for details).     Uncomfortable initially but non-toxic on exam, mildly hypertensive. Neuro intact. EKG reassuring.  Lab work is overall reassuring with normal BMP and CBC, negative serial troponins.  Chest x-ray negative acute.  UA without evidence of infection.  After receiving meclizine and Zofran, patient stated she felt better.  Her predominant complaint seems to be dizziness that has improved after these medications.  Her chest pain description is is very atypical for ACS and seems to be connected to her complaint of abdominal pain up and down her left side.  This pain has completely resolved.  Given her reassuring work-up and no pain on reassessment, I do not feel she needs any abdominal imaging.  Her description is extremely atypical for aortic dissection or AAA.  I have discussed follow-up with her PCP and supportive measures at home.  I have extensively  reviewed return precautions with her.  She feels better and feels comfortable going home.  Final Clinical Impressions(s) / ED Diagnoses   Final diagnoses:  Dizziness  Nausea  Left sided abdominal pain  Atypical chest pain    ED Discharge Orders    None       Cate Oravec, Wenda Overland, MD 04/13/18 [phone]

## 2018-04-12 NOTE — ED Notes (Signed)
Patient aware we need urine sample. Patient will call out when ready.  

## 2018-04-19 ENCOUNTER — Emergency Department (HOSPITAL_COMMUNITY): Payer: Medicare Other

## 2018-04-19 ENCOUNTER — Emergency Department (HOSPITAL_COMMUNITY)
Admission: EM | Admit: 2018-04-19 | Discharge: 2018-04-19 | Disposition: A | Discharge status: Home / Self Care | Payer: Medicare Other | Attending: Emergency Medicine | Admitting: Emergency Medicine

## 2018-04-19 ENCOUNTER — Encounter (HOSPITAL_COMMUNITY): Payer: Self-pay

## 2018-04-19 DIAGNOSIS — R42 Dizziness and giddiness: Secondary | ICD-10-CM | POA: Diagnosis not present

## 2018-04-19 DIAGNOSIS — J449 Chronic obstructive pulmonary disease, unspecified: Secondary | ICD-10-CM | POA: Diagnosis not present

## 2018-04-19 DIAGNOSIS — R000 Tachycardia, unspecified: Secondary | ICD-10-CM | POA: Diagnosis not present

## 2018-04-19 DIAGNOSIS — I4891 Unspecified atrial fibrillation: Secondary | ICD-10-CM | POA: Insufficient documentation

## 2018-04-19 DIAGNOSIS — R51 Headache: Secondary | ICD-10-CM | POA: Diagnosis not present

## 2018-04-19 DIAGNOSIS — I10 Essential (primary) hypertension: Secondary | ICD-10-CM | POA: Insufficient documentation

## 2018-04-19 DIAGNOSIS — R0902 Hypoxemia: Secondary | ICD-10-CM | POA: Diagnosis not present

## 2018-04-19 DIAGNOSIS — Z79899 Other long term (current) drug therapy: Secondary | ICD-10-CM | POA: Diagnosis not present

## 2018-04-19 LAB — DIFFERENTIAL
Abs Immature Granulocytes: 0.06 10*3/uL (ref 0.00–0.07)
BASOS ABS: 0.1 10*3/uL (ref 0.0–0.1)
Basophils Relative: 1 %
EOS PCT: 2 %
Eosinophils Absolute: 0.2 10*3/uL (ref 0.0–0.5)
Immature Granulocytes: 1 %
LYMPHS ABS: 1.3 10*3/uL (ref 0.7–4.0)
LYMPHS PCT: 14 %
MONO ABS: 0.6 10*3/uL (ref 0.1–1.0)
Monocytes Relative: 6 %
Neutro Abs: 7.1 10*3/uL (ref 1.7–7.7)
Neutrophils Relative %: 76 %

## 2018-04-19 LAB — I-STAT CHEM 8, ED
BUN: 19 mg/dL (ref 8–23)
CALCIUM ION: 1.21 mmol/L (ref 1.15–1.40)
CHLORIDE: 108 mmol/L (ref 98–111)
Creatinine, Ser: 1.1 mg/dL — ABNORMAL HIGH (ref 0.44–1.00)
Glucose, Bld: 109 mg/dL — ABNORMAL HIGH (ref 70–99)
HCT: 51 % — ABNORMAL HIGH (ref 36.0–46.0)
HEMOGLOBIN: 17.3 g/dL — AB (ref 12.0–15.0)
Potassium: 4.4 mmol/L (ref 3.5–5.1)
Sodium: 143 mmol/L (ref 135–145)
TCO2: 28 mmol/L (ref 22–32)

## 2018-04-19 LAB — I-STAT TROPONIN, ED
TROPONIN I, POC: 0.01 ng/mL (ref 0.00–0.08)
Troponin i, poc: 0.01 ng/mL (ref 0.00–0.08)

## 2018-04-19 LAB — APTT: aPTT: 35 seconds (ref 24–36)

## 2018-04-19 LAB — URINALYSIS, ROUTINE W REFLEX MICROSCOPIC
Bilirubin Urine: NEGATIVE
Glucose, UA: NEGATIVE mg/dL
KETONES UR: NEGATIVE mg/dL
Nitrite: NEGATIVE
PROTEIN: NEGATIVE mg/dL
Specific Gravity, Urine: 1.008 (ref 1.005–1.030)
pH: 7 (ref 5.0–8.0)

## 2018-04-19 LAB — COMPREHENSIVE METABOLIC PANEL
ALK PHOS: 48 U/L (ref 38–126)
ALT: 18 U/L (ref 0–44)
ANION GAP: 8 (ref 5–15)
AST: 24 U/L (ref 15–41)
Albumin: 3.7 g/dL (ref 3.5–5.0)
BILIRUBIN TOTAL: 0.6 mg/dL (ref 0.3–1.2)
BUN: 15 mg/dL (ref 8–23)
CALCIUM: 9.5 mg/dL (ref 8.9–10.3)
CO2: 25 mmol/L (ref 22–32)
CREATININE: 1.17 mg/dL — AB (ref 0.44–1.00)
Chloride: 109 mmol/L (ref 98–111)
GFR calc non Af Amer: 42 mL/min — ABNORMAL LOW (ref >60)
GFR, EST AFRICAN AMERICAN: 49 mL/min — AB (ref >60)
Glucose, Bld: 115 mg/dL — ABNORMAL HIGH (ref 70–99)
Potassium: 4.5 mmol/L (ref 3.5–5.1)
Sodium: 142 mmol/L (ref 135–145)
Total Protein: 6.9 g/dL (ref 6.5–8.1)

## 2018-04-19 LAB — CBC
HEMATOCRIT: 50.7 % — AB (ref 36.0–46.0)
Hemoglobin: 16.4 g/dL — ABNORMAL HIGH (ref 12.0–15.0)
MCH: 30.7 pg (ref 26.0–34.0)
MCHC: 32.3 g/dL (ref 30.0–36.0)
MCV: 94.8 fL (ref 80.0–100.0)
Platelets: 279 10*3/uL (ref 150–400)
RBC: 5.35 MIL/uL — ABNORMAL HIGH (ref 3.87–5.11)
RDW: 13.1 % (ref 11.5–15.5)
WBC: 9.3 10*3/uL (ref 4.0–10.5)
nRBC: 0 % (ref 0.0–0.2)

## 2018-04-19 LAB — PROTIME-INR
INR: 1.04
PROTHROMBIN TIME: 13.5 s (ref 11.4–15.2)

## 2018-04-19 LAB — RAPID URINE DRUG SCREEN, HOSP PERFORMED
AMPHETAMINES: NOT DETECTED
Barbiturates: NOT DETECTED
Benzodiazepines: NOT DETECTED
COCAINE: NOT DETECTED
Opiates: POSITIVE — AB
Tetrahydrocannabinol: NOT DETECTED

## 2018-04-19 LAB — ETHANOL: Alcohol, Ethyl (B): <10 mg/dL (ref <10)

## 2018-04-19 MED ORDER — SODIUM CHLORIDE 0.9 % IV BOLUS
1000.0000 mL | Freq: Once | INTRAVENOUS | Status: AC
  Administered 2018-04-19: 1000 mL via INTRAVENOUS

## 2018-04-19 MED ORDER — LORAZEPAM 2 MG/ML IJ SOLN
1.0000 mg | Freq: Once | INTRAMUSCULAR | Status: AC
  Administered 2018-04-19: 1 mg via INTRAVENOUS
  Filled 2018-04-19: qty 1

## 2018-04-19 MED ORDER — IOPAMIDOL (ISOVUE-370) INJECTION 76%
75.0000 mL | Freq: Once | INTRAVENOUS | Status: AC | PRN
  Administered 2018-04-19: 55 mL via INTRAVENOUS

## 2018-04-19 MED ORDER — MECLIZINE HCL 25 MG PO TABS: 12.5000 mg | ORAL_TABLET | Freq: Two times a day (BID) | ORAL | 0 refills | Status: DC | PRN

## 2018-04-19 MED ORDER — METOPROLOL TARTRATE 5 MG/5ML IV SOLN: 5.0000 mg | Freq: Once | INTRAVENOUS | Status: DC

## 2018-04-19 MED ORDER — MECLIZINE HCL 25 MG PO TABS: 25.0000 mg | ORAL_TABLET | Freq: Two times a day (BID) | ORAL | 0 refills | Status: DC | PRN

## 2018-04-19 MED ORDER — IOPAMIDOL (ISOVUE-370) INJECTION 76%
INTRAVENOUS | Status: AC
  Filled 2018-04-19: qty 100

## 2018-04-19 MED ORDER — FENTANYL CITRATE (PF) 100 MCG/2ML IJ SOLN
50.0000 ug | Freq: Once | INTRAMUSCULAR | Status: AC
  Administered 2018-04-19: 50 ug via INTRAVENOUS
  Filled 2018-04-19: qty 2

## 2018-04-19 NOTE — ED Provider Notes (Signed)
[facility] EMERGENCY DEPARTMENT Provider Note   CSN: [id number] Arrival date & time: 04/19/18  1540     History   Chief Complaint Chief Complaint  Patient presents with  . Atrial Fibrillation    HPI [name] is a 84 y.o. female with a past medical history of hypertension, atrial fibrillation currently on as needed metoprolol and as needed Eliquis presents to ED for chest pressure, palpitations, headache, pain in the back of her neck that woke her up from sleep approximately 4 hours ago.  States that she had a diagnosis of A. fib several months ago and that was the last time she took the metoprolol and Eliquis.  She also feels as though "my throat is tight."  She denies history of similar symptoms in the past.  She was seen and evaluated on 11/8 here in the ED and was diagnosed with vertigo.  She cannot recall any incident that may have triggered the symptoms.  States that she felt "fine" when going to bed last night.  Denies any injuries or falls, vision changes, numbness in arms or legs, shortness of breath, hemoptysis.  HPI  Past Medical History:  Diagnosis Date  . Arm numbness left   . Back pain   . Bronchitis, chronic ([place])   . Depression   . Dizziness   . Fatigue   . Hypertension    mild  . Palpitations   . Skin cancer   . Stress   . Syncope    History of     Patient Active Problem List   Diagnosis Date Noted  . Thyroid nodule 04/06/2016  . Upper airway cough syndrome 11/24/2015  . Near syncope 03/02/2015  . Mastoiditis of both sides 08/10/2014  . Palpitations   . Syncope   . Dizziness   . Hypertension   . Back pain   . Depression   . Stress   . Fatigue   . Arm numbness left   . Skin cancer   . Bronchitis, chronic ([place])   . Obstructive bronchiectasis ([place]) 08/28/2007    Past Surgical History:  Procedure Laterality Date  . APPENDECTOMY    . CESAREAN SECTION  [phone]     OB History   None      Home Medications     Prior to Admission medications   Medication Sig Start Date End Date Taking? Authorizing Provider  acetaminophen (TYLENOL) 500 MG tablet Take 1,000 mg by mouth every 6 (six) hours as needed for mild pain.   Yes [provider]  apixaban (ELIQUIS) 2.5 MG TABS tablet Take 2.5 mg by mouth once.    Yes [provider]  HYDROcodone-acetaminophen (NORCO/VICODIN) 5-325 MG per tablet Take 1 tablet by mouth at bedtime.   Yes [provider]  metoprolol succinate (TOPROL-XL) 25 MG 24 hr tablet Take 25 mg by mouth once.    Yes [provider]  Multiple Vitamin (MULTI-DAY VITAMINS) TABS Take 1 tablet by mouth daily.   Yes [provider]  meclizine (ANTIVERT) 25 MG tablet Take 0.5 tablets (12.5 mg total) by mouth 2 (two) times daily as needed for dizziness. 04/19/18   Delia Heady, PA-C    Family History Family History  Problem Relation Age of Onset  . Atrial fibrillation Mother   . Thyroid disease Mother   . Alcohol abuse Father     Social History Social History   Tobacco Use  . Smoking status: Never Smoker  . Smokeless tobacco: Never Used  Substance  Use Topics  . Alcohol use: Yes    Comment: social ETOH  . Drug use: No     Allergies   Ciprofloxacin; Penicillins; Sulfonamide derivatives; Avelox [moxifloxacin]; Reclast [zoledronic acid]; and Chlorpheniramine   Review of Systems Review of Systems  Constitutional: Negative for appetite change, chills and fever.  HENT: Positive for sore throat. Negative for ear pain, rhinorrhea and sneezing.   Eyes: Negative for photophobia and visual disturbance.  Respiratory: Negative for cough, chest tightness, shortness of breath and wheezing.   Cardiovascular: Positive for chest pain. Negative for palpitations.  Gastrointestinal: Negative for abdominal pain, blood in stool, constipation, diarrhea, nausea and vomiting.  Genitourinary: Negative for dysuria, hematuria and urgency.  Musculoskeletal:  Positive for neck pain. Negative for myalgias.  Skin: Negative for rash.  Neurological: Positive for headaches. Negative for dizziness, weakness and light-headedness.     Physical Exam Updated Vital Signs BP 134/67   Pulse (!) 58   Temp (!) 97.5 F (36.4 C) (Oral)   Resp 14   Ht 5\' 4"  (1.626 m)   Wt 58.1 kg   SpO2 97%   BMI 21.97 kg/m   Physical Exam  Constitutional: She is oriented to person, place, and time. She appears well-developed and well-nourished. No distress.  Speaking complete sentences without difficulty.  No signs of angioedema or anaphylaxis.  Appears anxious.  HENT:  Head: Normocephalic and atraumatic.  Nose: Nose normal.  Mouth/Throat: Uvula is midline and oropharynx is clear and moist. Tonsils are 0 on the right. Tonsils are 0 on the left.  Eyes: Conjunctivae and EOM are normal. Right eye exhibits no discharge. Left eye exhibits no discharge. No scleral icterus.  Neck: Normal range of motion. Neck supple.  No meningismus.   Cardiovascular: Normal heart sounds and intact distal pulses. An irregularly irregular rhythm present. Tachycardia present. Exam reveals no gallop and no friction rub.  No murmur heard. Pulmonary/Chest: Effort normal and breath sounds normal. No respiratory distress.  Abdominal: Soft. Bowel sounds are normal. She exhibits no distension. There is no tenderness. There is no guarding.  Musculoskeletal: Normal range of motion. She exhibits no edema.  Neurological: She is alert and oriented to person, place, and time. No cranial nerve deficit or sensory deficit. She exhibits normal muscle tone. Coordination normal.  Pupils reactive. No facial asymmetry noted. Cranial nerves appear grossly intact. Sensation intact to light touch on face, BUE and BLE. Strength 5/5 in BUE and BLE.  Skin: Skin is warm and dry. No rash noted.  Psychiatric: She has a normal mood and affect.  Nursing note and vitals reviewed.    ED Treatments / Results  Labs (all  labs ordered are listed, but only abnormal results are displayed) Labs Reviewed  CBC - Abnormal; Notable for the following components:      Result Value   RBC 5.35 (*)    Hemoglobin 16.4 (*)    HCT 50.7 (*)    All other components within normal limits  COMPREHENSIVE METABOLIC PANEL - Abnormal; Notable for the following components:   Glucose, Bld 115 (*)    Creatinine, Ser 1.17 (*)    GFR calc non Af Amer 42 (*)    GFR calc Af Amer 49 (*)    All other components within normal limits  RAPID URINE DRUG SCREEN, HOSP PERFORMED - Abnormal; Notable for the following components:   Opiates POSITIVE (*)    All other components within normal limits  URINALYSIS, ROUTINE W REFLEX MICROSCOPIC - Abnormal; Notable for the  following components:   Hgb urine dipstick SMALL (*)    Leukocytes, UA TRACE (*)    Bacteria, UA RARE (*)    All other components within normal limits  I-STAT CHEM 8, ED - Abnormal; Notable for the following components:   Creatinine, Ser 1.10 (*)    Glucose, Bld 109 (*)    Hemoglobin 17.3 (*)    HCT 51.0 (*)    All other components within normal limits  ETHANOL  PROTIME-INR  APTT  DIFFERENTIAL  I-STAT TROPONIN, ED  I-STAT TROPONIN, ED    EKG EKG Interpretation  Date/Time:  Friday April 19 2018 09:52:04 EST Ventricular Rate:  72 PR Interval:    QRS Duration: 96 QT Interval:  387 QTC Calculation: 424 R Axis:   96 Text Interpretation:  Right and left arm electrode reversal, interpretation assumes no reversal Sinus rhythm Probable left atrial enlargement Low voltage, precordial leads Consider right ventricular hypertrophy Repol abnrm suggests ischemia, lateral leads No significant change since last tracing Confirmed by Wandra Arthurs [phone]) on 04/19/2018 10:23:26 AM   Radiology Ct Angio Head W Or Wo Contrast  Result Date: 04/19/2018 CLINICAL DATA:  Patient with atrial fibrillation, patient awoke feeling weak, diagnosed with vertigo recently. Headache. EXAM: CT  ANGIOGRAPHY HEAD AND NECK TECHNIQUE: Multidetector CT imaging of the head and neck was performed using the standard protocol during bolus administration of intravenous contrast. Multiplanar CT image reconstructions and MIPs were obtained to evaluate the vascular anatomy. Carotid stenosis measurements (when applicable) are obtained utilizing NASCET criteria, using the distal internal carotid diameter as the denominator. CONTRAST:  60mL ISOVUE-370 IOPAMIDOL (ISOVUE-370) INJECTION 76% COMPARISON:  MRI brain earlier today was negative for acute stroke. FINDINGS: CT HEAD FINDINGS Brain: No evidence of acute infarction, hemorrhage, hydrocephalus, extra-axial collection or mass lesion/mass effect. Atrophy with chronic ischemic change, better demonstrated on MR. Vascular: Reported separately Skull: Intact Sinuses: No acute findings. Orbits: Unremarkable Review of the MIP images confirms the above findings CTA NECK FINDINGS Aortic arch: Incompletely visualized great vessel origins. Minor atheromatous change proximal LEFT subclavian. Right carotid system: No evidence of dissection, stenosis (50% or greater) or occlusion. Left carotid system: No evidence of dissection, stenosis (50% or greater) or occlusion. Vertebral arteries: LEFT vertebral dominant.  No ostial narrowing. Skeleton: Minor spondylosis.  No aggressive osseous lesion. Other neck: Mixed attenuation LEFT thyroid lesion, 13 x 17 mm. Upper chest: No mass or pneumothorax. Review of the MIP images confirms the above findings CTA HEAD FINDINGS Anterior circulation: No significant stenosis, proximal occlusion, aneurysm, or vascular malformation. Posterior circulation: No significant stenosis, proximal occlusion, aneurysm, or vascular malformation. RIGHT vertebral contributes solely to PICA. LEFT vertebral dominant, and supplies the basilar. Venous sinuses: As permitted by contrast timing, patent. Anatomic variants: None of significance. Delayed phase: No abnormal  postcontrast enhancement. Review of the MIP images confirms the above findings IMPRESSION: No intracranial or extracranial stenosis, vascular occlusion, or dissection. No cause is seen for the reported symptoms. Atrophy and small vessel disease, better demonstrated on prior MR. No abnormal postcontrast enhancement. Mixed attenuation LEFT thyroid lesion, 13 x 17 mm. Consider further evaluation with thyroid ultrasound. If patient is clinically hyperthyroid, consider nuclear medicine thyroid uptake and scan. Electronically Signed   By: Staci Righter M.D.   On: 04/19/2018 13:32   Dg Chest 2 View  Result Date: 04/19/2018 CLINICAL DATA:  Two week history of malaise. Near syncopal episode today, dizziness, sensation of throat closing. Patient also reports chills. History of chronic bronchitis. Never smoked.  EXAM: CHEST - 2 VIEW COMPARISON:  PA and lateral chest x-ray of January 10, 2018 and August 28, 2017. FINDINGS: The lungs remain hyperinflated with mild hemidiaphragm flattening. Patchy nodular densities are present in the right lung and appears stable since the August 28, 2017 study. The interstitial markings of the right lung are slightly increased overall however. The left lung is clear. The heart and pulmonary vascularity are normal. The mediastinum is normal in width. There is no pleural effusion. The bony thorax exhibits no acute abnormality. IMPRESSION: COPD-reactive airway disease. Mildly increased lung markings on the right may reflect acute bronchitis. Patchy nodular densities in the right lung are stable. If the patient's symptoms persist following a trial of antibiotic therapy, chest CT scanning would be a useful next imaging step. Electronically Signed   By: David  Martinique M.D.   On: 04/19/2018 11:01   Ct Angio Neck W Or Wo Contrast  Result Date: 04/19/2018 CLINICAL DATA:  Patient with atrial fibrillation, patient awoke feeling weak, diagnosed with vertigo recently. Headache. EXAM: CT ANGIOGRAPHY HEAD  AND NECK TECHNIQUE: Multidetector CT imaging of the head and neck was performed using the standard protocol during bolus administration of intravenous contrast. Multiplanar CT image reconstructions and MIPs were obtained to evaluate the vascular anatomy. Carotid stenosis measurements (when applicable) are obtained utilizing NASCET criteria, using the distal internal carotid diameter as the denominator. CONTRAST:  46mL ISOVUE-370 IOPAMIDOL (ISOVUE-370) INJECTION 76% COMPARISON:  MRI brain earlier today was negative for acute stroke. FINDINGS: CT HEAD FINDINGS Brain: No evidence of acute infarction, hemorrhage, hydrocephalus, extra-axial collection or mass lesion/mass effect. Atrophy with chronic ischemic change, better demonstrated on MR. Vascular: Reported separately Skull: Intact Sinuses: No acute findings. Orbits: Unremarkable Review of the MIP images confirms the above findings CTA NECK FINDINGS Aortic arch: Incompletely visualized great vessel origins. Minor atheromatous change proximal LEFT subclavian. Right carotid system: No evidence of dissection, stenosis (50% or greater) or occlusion. Left carotid system: No evidence of dissection, stenosis (50% or greater) or occlusion. Vertebral arteries: LEFT vertebral dominant.  No ostial narrowing. Skeleton: Minor spondylosis.  No aggressive osseous lesion. Other neck: Mixed attenuation LEFT thyroid lesion, 13 x 17 mm. Upper chest: No mass or pneumothorax. Review of the MIP images confirms the above findings CTA HEAD FINDINGS Anterior circulation: No significant stenosis, proximal occlusion, aneurysm, or vascular malformation. Posterior circulation: No significant stenosis, proximal occlusion, aneurysm, or vascular malformation. RIGHT vertebral contributes solely to PICA. LEFT vertebral dominant, and supplies the basilar. Venous sinuses: As permitted by contrast timing, patent. Anatomic variants: None of significance. Delayed phase: No abnormal postcontrast  enhancement. Review of the MIP images confirms the above findings IMPRESSION: No intracranial or extracranial stenosis, vascular occlusion, or dissection. No cause is seen for the reported symptoms. Atrophy and small vessel disease, better demonstrated on prior MR. No abnormal postcontrast enhancement. Mixed attenuation LEFT thyroid lesion, 13 x 17 mm. Consider further evaluation with thyroid ultrasound. If patient is clinically hyperthyroid, consider nuclear medicine thyroid uptake and scan. Electronically Signed   By: Staci Righter M.D.   On: 04/19/2018 13:32   Mr Brain Wo Contrast  Result Date: 04/19/2018 CLINICAL DATA:  New headache. Weakness. History of atrial fibrillation. EXAM: MRI HEAD WITHOUT CONTRAST TECHNIQUE: Multiplanar, multiecho pulse sequences of the brain and surrounding structures were obtained without intravenous contrast. COMPARISON:  08/13/2016 FINDINGS: Brain: There is no evidence of acute infarct, intracranial hemorrhage, mass, midline shift, or extra-axial fluid collection. Patchy T2 hyperintensities throughout the cerebral white matter are unchanged  and nonspecific but compatible with moderate chronic small vessel ischemic disease. A chronic lacunar infarct is again noted near the genu of the right internal capsule. There are punctate chronic infarcts in the cerebellum. Mild cerebral atrophy is within normal limits for age. Vascular: Major intracranial vascular flow voids are preserved. Skull and upper cervical spine: Unremarkable bone marrow signal. Sinuses/Orbits: Bilateral cataract extraction. Trace left mastoid effusion. Clear paranasal sinuses. Other: None. IMPRESSION: 1. No acute intracranial abnormality. 2. Moderate chronic small vessel ischemic disease. Electronically Signed   By: Logan Bores M.D.   On: 04/19/2018 12:22    Procedures Procedures (including critical care time)  Medications Ordered in ED Medications  iopamidol (ISOVUE-370) 76 % injection (has no  administration in time range)  sodium chloride 0.9 % bolus 1,000 mL (0 mLs Intravenous Stopped 04/19/18 1030)  fentaNYL (SUBLIMAZE) injection 50 mcg (50 mcg Intravenous Given 04/19/18 1118)  LORazepam (ATIVAN) injection 1 mg (1 mg Intravenous Given 04/19/18 1122)  iopamidol (ISOVUE-370) 76 % injection 75 mL (55 mLs Intravenous Contrast Given 04/19/18 1241)     Initial Impression / Assessment and Plan / ED Course  I have reviewed the triage vital signs and the nursing notes.  Pertinent labs & imaging results that were available during my care of the patient were reviewed by me and considered in my medical decision making (see chart for details).  Clinical Course as of Apr 19 1457  Fri Apr 19, 2018  1015 Patient converted back to sinus rhythm.  Will discontinue metoprolol and add fluids.   [HK]    Clinical Course User Index [HK] Delia Heady, [place]    84yo F with a pmh of HTN, Afib (taking prn Eliquis and metoprolol for palpitations, last took PTA), presents to ED for multiple complaints. She reports chest pressure, palpitations, pain the back of her neck that woke her up from sleep 4hrs prior to my initial evaluation. She was in Afib on my assessment and then converted back to NSR and has been in NSR since then. She feels as though her throat is tight but I see no signs of respiratory distress, airway compromise, anaphylaxis. Labwork significant for mild elevation in Cr, otherwise unremarkable. Initial and delta troponin both negative. EKG shows no changes from prior tracings. CTA neck and head negative for acute abnormality, as well as MR of the brain. Patient continuing to rest in NAD. Suspect that symptoms could be due to dehydration. She reports improvement in symptoms with IV hydration.  Patient resting in NAD.  I feel though today and in her prior visit last week, there has been a thorough work-up to rule out cardiac or neurological cause of her symptoms.  Will advise her to take Eliquis  twice a day instead of as needed as she was originally instructed to take per cardiology notes.  We will also add meclizine.  Advised to return to ED for any severe worsening symptoms.  Patient is hemodynamically stable, in NAD, and able to ambulate in the ED. Evaluation does not show pathology that would require ongoing emergent intervention or inpatient treatment. I explained the diagnosis to the patient. Pain has been managed and has no complaints prior to discharge. Patient is comfortable with above plan and is stable for discharge at this time. All questions were answered prior to disposition. Strict return precautions for returning to the ED were discussed. Encouraged follow up with PCP.    Portions of this note were generated with [employer]. Dictation errors may occur  despite best attempts at proofreading.   Final Clinical Impressions(s) / ED Diagnoses   Final diagnoses:  Vertigo  Atrial fibrillation, unspecified type [facility])    ED Discharge Orders         Ordered    meclizine (ANTIVERT) 25 MG tablet  2 times daily PRN,   Status:  Discontinued     04/19/18 1456    meclizine (ANTIVERT) 25 MG tablet  2 times daily PRN     04/19/18 1456           Delia Heady, PA-C 04/19/18 1458    Drenda Freeze, MD 04/22/18 [phone]

## 2018-04-19 NOTE — Discharge Instructions (Signed)
Follow-up with your doctor for further evaluation. Return to ED for worsening symptoms, headache, vision changes, severe chest pain, shortness of breath, trouble walking.

## 2018-04-19 NOTE — ED Notes (Signed)
Patient transported to MRI 

## 2018-04-19 NOTE — ED Notes (Signed)
Patient transported to X-ray 

## 2018-04-19 NOTE — ED Notes (Signed)
Patient transported to CT 

## 2018-04-19 NOTE — ED Triage Notes (Addendum)
Pt from home via ems; called out for Afib; pt woke this am feeling weak, diagnosed w/ vertigo last Friday; eliquis and metoprolol prescribed, pt "only takes them when she needs them"; pt also states she throat is closing since 0700; a fib on monitor 80-140; hx afib; NAD at this time  140/102 CBG 178 100% RA HR 10-140

## 2018-04-19 NOTE — ED Notes (Signed)
Pt ambulatory to restroom w/ steady gait 

## 2018-04-19 NOTE — ED Notes (Signed)
Patient verbalizes understanding of discharge instructions. Opportunity for questioning and answers were provided. Pt discharged from ED. 

## 2018-04-23 DIAGNOSIS — M47812 Spondylosis without myelopathy or radiculopathy, cervical region: Secondary | ICD-10-CM | POA: Diagnosis not present

## 2018-04-23 DIAGNOSIS — Z6821 Body mass index (BMI) 21.0-21.9, adult: Secondary | ICD-10-CM | POA: Diagnosis not present

## 2018-04-23 DIAGNOSIS — G450 Vertebro-basilar artery syndrome: Secondary | ICD-10-CM | POA: Diagnosis not present

## 2018-05-06 ENCOUNTER — Encounter: Payer: Self-pay | Admitting: Nurse Practitioner

## 2018-05-06 ENCOUNTER — Ambulatory Visit (INDEPENDENT_AMBULATORY_CARE_PROVIDER_SITE_OTHER): Payer: Medicare Other | Admitting: Nurse Practitioner

## 2018-05-06 VITALS — BP 132/84 | HR 85 | Ht 64.0 in | Wt 130.0 lb

## 2018-05-06 DIAGNOSIS — J209 Acute bronchitis, unspecified: Secondary | ICD-10-CM

## 2018-05-06 MED ORDER — BENZONATATE 100 MG PO CAPS: 100.0000 mg | ORAL_CAPSULE | Freq: Three times a day (TID) | ORAL | 1 refills | Status: DC

## 2018-05-06 MED ORDER — DOXYCYCLINE HYCLATE 100 MG PO TABS: 100.0000 mg | ORAL_TABLET | Freq: Two times a day (BID) | ORAL | 0 refills | Status: DC

## 2018-05-06 NOTE — Patient Instructions (Addendum)
Will order doxycycline Stop azithromycin Take mucinex daily  Use flutter valve Will order Tessalon pearls for cough Please follow up with Dr. Melvyn Novas at first available appointment  General instructions for cough: Continue gastroesophageal reflux disease treatment with elevating the head your bed and taking antacids Prilosec otc 20mg   Take 30-60 min before first meal of the day and Pepcid ac (famotidine) 20 mg one @  bedtime until cough is completely gone for at least a week without the need for cough suppression Continue taking over-the-counter antihistamines and nasal fluticasone to help with allergic rhinitis You need to try to suppress your cough to allow your larynx (voice box) to heal.  For three days don't talk, laugh, sing, or clear your throat. Do everything you can to suppress the cough during this time. Use hard candies (sugarless Jolly Ranchers) or non-mint or non-menthol containing cough drops during this time to soothe your throat.  Use a cough suppressant (Delsym or what I have prescribed you) around the clock during this time.  After three days, gradually increase the use of your voice and back off on the cough suppressants.

## 2018-05-06 NOTE — Progress Notes (Signed)
_0  ID: [name], female    DOB: [birth year], 84 y.o.   MRN: [id number]  Chief Complaint  Patient presents with  . Cough    Referring provider: Orpah Melter, MD  HPI  50  yowf never smoker with recurrent cough since age 44 with right middle lobe/lingular syndrome with limited bronchiectatic changes on  CT study dated 09/2005   Tests/events: CT Chest 09/29/05 Stable bronchiectasis in right middle lobe and lingula.  Scattered tree in bud opacities throughout the lungs, right greater than left,  nonspecific.   - PFT's 06/21/2012 1.67 (91%) ratio 64 and no change,  DLCO 77% - Flutter valve added 04/27/2014  - PFTs 05/24/15   FEV1 1.55 (81%) ratio 69 p 6% improvement from saba and dlco 62% ratio 88%  - HRCT 04/04/16 >>> 1. 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 2. An incidental finding of potential clinical significance has been found. Dominant nodule in the right upper lobe, likely infectious/inflammatory in etiology.  - Zmax 250 mg daily 03/01/16 - 05/01/16 improved cough at d/c but diarrhea while on it and no change in nodule RUL > d/c 07/07/16 - 08/28/2017 added back cycles of zpak prn flare of purulent bronchitis   OV 05/06/18 - acute cough Patient presents with cough and chest congestion.  She states that symptoms started a couple weeks ago.  Her cough is productive of thick white sputum.  States that symptoms have progressively worsened. She was prescribed a Z-Pak by PCP.  She completed the Z-Pak and had some azithromycin left over at home.  She has been taking 1 tablet daily since she finished a Z-Pak few days ago.  He states that the azithromycin has not helped.  Her cough continues to be persistent.  Is any fever, shortness of breath, chest pain, or edema.    Allergies  Allergen Reactions  . Ciprofloxacin Other (See  Comments)    triggered a migraine that was thought to be a TIA  . Penicillins Rash    Has patient had a PCN reaction causing immediate rash, facial/tongue/throat swelling, SOB or lightheadedness with hypotension: Yes Has patient had a PCN reaction causing severe rash involving mucus membranes or skin necrosis: No Has patient had a PCN reaction that required hospitalization: No Has patient had a PCN reaction occurring within the last 10 years: No If all of the above answers are "NO", then may proceed with Cephalosporin use.  . Sulfonamide Derivatives Swelling  . Avelox [Moxifloxacin] Other (See Comments)    Rash and severe tingling/numbness   . Reclast [Zoledronic Acid] Other (See Comments)    hypertension  . Chlorpheniramine Other (See Comments)    Headache, stomach ache, bladder "not working well"    Immunization History  Administered Date(s) Administered  . Influenza Split 03/08/2012, 03/24/2014  . Influenza Whole 04/28/2009  . Influenza, Seasonal, Injecte, Preservative Fre 04/06/2015  . Pneumococcal Conjugate-13 04/27/2014  . Pneumococcal Polysaccharide-23 04/28/2009    Past Medical History:  Diagnosis Date  . Arm numbness left   . Back pain   . Bronchitis, chronic (Home)   . Depression   . Dizziness   . Fatigue   . Hypertension    mild  . Palpitations   . Skin cancer   . Stress   . Syncope    History of     Tobacco History: Social History   Tobacco Use  Smoking Status Never Smoker  Smokeless  Tobacco Never Used   Counseling given: Yes   Outpatient Encounter Medications as of 05/06/2018  Medication Sig  . acetaminophen (TYLENOL) 500 MG tablet Take 1,000 mg by mouth every 6 (six) hours as needed for mild pain.  Marland Kitchen apixaban (ELIQUIS) 2.5 MG TABS tablet Take 2.5 mg by mouth once.   Marland Kitchen HYDROcodone-acetaminophen (NORCO/VICODIN) 5-325 MG per tablet Take 1 tablet by mouth at bedtime.  . meclizine (ANTIVERT) 25 MG tablet Take 0.5 tablets (12.5 mg total) by mouth 2  (two) times daily as needed for dizziness.  . metoprolol succinate (TOPROL-XL) 25 MG 24 hr tablet Take 25 mg by mouth once.   . Multiple Vitamin (MULTI-DAY VITAMINS) TABS Take 1 tablet by mouth daily.  . benzonatate (TESSALON) 100 MG capsule Take 1 capsule (100 mg total) by mouth 3 (three) times daily.  Marland Kitchen doxycycline (VIBRA-TABS) 100 MG tablet Take 1 tablet (100 mg total) by mouth 2 (two) times daily.   No facility-administered encounter medications on file as of 05/06/2018.      Review of Systems  Review of Systems  Constitutional: Negative.  Negative for chills and fever.  HENT: Positive for congestion and postnasal drip.   Respiratory: Positive for cough. Negative for shortness of breath.   Cardiovascular: Negative.  Negative for chest pain and leg swelling.  Gastrointestinal: Negative.   Allergic/Immunologic: Negative.   Neurological: Negative.   Psychiatric/Behavioral: Negative.        Physical Exam  BP 132/84 (BP Location: Left Arm, Patient Position: Sitting, Cuff Size: Normal)   Pulse 85   Ht _0  (1.626 m)   Wt 130 lb (59 kg)   SpO2 97%   BMI 22.31 kg/m   Wt Readings from Last 5 Encounters:  05/06/18 130 lb (59 kg)  04/19/18 128 lb (58.1 kg)  04/12/18 129 lb (58.5 kg)  02/12/18 130 lb 3.2 oz (59.1 kg)  11/30/17 131 lb (59.4 kg)     Physical Exam  Constitutional: She is oriented to person, place, and time. She appears well-developed and well-nourished. No distress.  Cardiovascular: Normal rate and regular rhythm.  Pulmonary/Chest: Effort normal and breath sounds normal. No respiratory distress. She has no wheezes. She has no rales.  Musculoskeletal: She exhibits no edema.  Neurological: She is alert and oriented to person, place, and time.  Psychiatric: She has a normal mood and affect.  Nursing note and vitals reviewed.    Imaging: Ct Angio Head W Or Wo Contrast  Result Date: 04/19/2018 CLINICAL DATA:  Patient with atrial fibrillation, patient awoke  feeling weak, diagnosed with vertigo recently. Headache. EXAM: CT ANGIOGRAPHY HEAD AND NECK TECHNIQUE: Multidetector CT imaging of the head and neck was performed using the standard protocol during bolus administration of intravenous contrast. Multiplanar CT image reconstructions and MIPs were obtained to evaluate the vascular anatomy. Carotid stenosis measurements (when applicable) are obtained utilizing NASCET criteria, using the distal internal carotid diameter as the denominator. CONTRAST:  109m ISOVUE-370 IOPAMIDOL (ISOVUE-370) INJECTION 76% COMPARISON:  MRI brain earlier today was negative for acute stroke. FINDINGS: CT HEAD FINDINGS Brain: No evidence of acute infarction, hemorrhage, hydrocephalus, extra-axial collection or mass lesion/mass effect. Atrophy with chronic ischemic change, better demonstrated on MR. Vascular: Reported separately Skull: Intact Sinuses: No acute findings. Orbits: Unremarkable Review of the MIP images confirms the above findings CTA NECK FINDINGS Aortic arch: Incompletely visualized great vessel origins. Minor atheromatous change proximal LEFT subclavian. Right carotid system: No evidence of dissection, stenosis (50% or greater) or occlusion. Left carotid system:  No evidence of dissection, stenosis (50% or greater) or occlusion. Vertebral arteries: LEFT vertebral dominant.  No ostial narrowing. Skeleton: Minor spondylosis.  No aggressive osseous lesion. Other neck: Mixed attenuation LEFT thyroid lesion, 13 x 17 mm. Upper chest: No mass or pneumothorax. Review of the MIP images confirms the above findings CTA HEAD FINDINGS Anterior circulation: No significant stenosis, proximal occlusion, aneurysm, or vascular malformation. Posterior circulation: No significant stenosis, proximal occlusion, aneurysm, or vascular malformation. RIGHT vertebral contributes solely to PICA. LEFT vertebral dominant, and supplies the basilar. Venous sinuses: As permitted by contrast timing, patent. Anatomic  variants: None of significance. Delayed phase: No abnormal postcontrast enhancement. Review of the MIP images confirms the above findings IMPRESSION: No intracranial or extracranial stenosis, vascular occlusion, or dissection. No cause is seen for the reported symptoms. Atrophy and small vessel disease, better demonstrated on prior MR. No abnormal postcontrast enhancement. Mixed attenuation LEFT thyroid lesion, 13 x 17 mm. Consider further evaluation with thyroid ultrasound. If patient is clinically hyperthyroid, consider nuclear medicine thyroid uptake and scan. Electronically Signed   By: Staci Righter M.D.   On: 04/19/2018 13:32   Dg Chest 2 View  Result Date: 04/19/2018 CLINICAL DATA:  Two week history of malaise. Near syncopal episode today, dizziness, sensation of throat closing. Patient also reports chills. History of chronic bronchitis. Never smoked. EXAM: CHEST - 2 VIEW COMPARISON:  PA and lateral chest x-ray of January 10, 2018 and August 28, 2017. FINDINGS: The lungs remain hyperinflated with mild hemidiaphragm flattening. Patchy nodular densities are present in the right lung and appears stable since the August 28, 2017 study. The interstitial markings of the right lung are slightly increased overall however. The left lung is clear. The heart and pulmonary vascularity are normal. The mediastinum is normal in width. There is no pleural effusion. The bony thorax exhibits no acute abnormality. IMPRESSION: COPD-reactive airway disease. Mildly increased lung markings on the right may reflect acute bronchitis. Patchy nodular densities in the right lung are stable. If the patient's symptoms persist following a trial of antibiotic therapy, chest CT scanning would be a useful next imaging step. Electronically Signed   By: David  Martinique M.D.   On: 04/19/2018 11:01   Dg Chest 2 View  Result Date: 04/12/2018 CLINICAL DATA:  84 y/o F; episode of dizziness, clamminess, lightheadedness, and near syncope. EXAM:  CHEST - 2 VIEW COMPARISON:  08/28/2017 chest radiograph. 04/04/2016 CT chest. FINDINGS: Normal cardiac silhouette. Stable reticular and nodular opacities concentrated in the right mid lung zone. No consolidation. No pleural effusion or pneumothorax. Right upper lobe pulmonary nodule on prior study is obscured by the EKG leads. No acute osseous abnormality is evident. IMPRESSION: Stable reticular and nodular opacities concentrated in the right mid lung zone. No acute pulmonary process identified. Electronically Signed   By: Kristine Garbe M.D.   On: 04/12/2018 18:37   Ct Angio Neck W Or Wo Contrast  Result Date: 04/19/2018 CLINICAL DATA:  Patient with atrial fibrillation, patient awoke feeling weak, diagnosed with vertigo recently. Headache. EXAM: CT ANGIOGRAPHY HEAD AND NECK TECHNIQUE: Multidetector CT imaging of the head and neck was performed using the standard protocol during bolus administration of intravenous contrast. Multiplanar CT image reconstructions and MIPs were obtained to evaluate the vascular anatomy. Carotid stenosis measurements (when applicable) are obtained utilizing NASCET criteria, using the distal internal carotid diameter as the denominator. CONTRAST:  24m ISOVUE-370 IOPAMIDOL (ISOVUE-370) INJECTION 76% COMPARISON:  MRI brain earlier today was negative for acute stroke. FINDINGS: CT  HEAD FINDINGS Brain: No evidence of acute infarction, hemorrhage, hydrocephalus, extra-axial collection or mass lesion/mass effect. Atrophy with chronic ischemic change, better demonstrated on MR. Vascular: Reported separately Skull: Intact Sinuses: No acute findings. Orbits: Unremarkable Review of the MIP images confirms the above findings CTA NECK FINDINGS Aortic arch: Incompletely visualized great vessel origins. Minor atheromatous change proximal LEFT subclavian. Right carotid system: No evidence of dissection, stenosis (50% or greater) or occlusion. Left carotid system: No evidence of  dissection, stenosis (50% or greater) or occlusion. Vertebral arteries: LEFT vertebral dominant.  No ostial narrowing. Skeleton: Minor spondylosis.  No aggressive osseous lesion. Other neck: Mixed attenuation LEFT thyroid lesion, 13 x 17 mm. Upper chest: No mass or pneumothorax. Review of the MIP images confirms the above findings CTA HEAD FINDINGS Anterior circulation: No significant stenosis, proximal occlusion, aneurysm, or vascular malformation. Posterior circulation: No significant stenosis, proximal occlusion, aneurysm, or vascular malformation. RIGHT vertebral contributes solely to PICA. LEFT vertebral dominant, and supplies the basilar. Venous sinuses: As permitted by contrast timing, patent. Anatomic variants: None of significance. Delayed phase: No abnormal postcontrast enhancement. Review of the MIP images confirms the above findings IMPRESSION: No intracranial or extracranial stenosis, vascular occlusion, or dissection. No cause is seen for the reported symptoms. Atrophy and small vessel disease, better demonstrated on prior MR. No abnormal postcontrast enhancement. Mixed attenuation LEFT thyroid lesion, 13 x 17 mm. Consider further evaluation with thyroid ultrasound. If patient is clinically hyperthyroid, consider nuclear medicine thyroid uptake and scan. Electronically Signed   By: Staci Righter M.D.   On: 04/19/2018 13:32   Mr Brain Wo Contrast  Result Date: 04/19/2018 CLINICAL DATA:  New headache. Weakness. History of atrial fibrillation. EXAM: MRI HEAD WITHOUT CONTRAST TECHNIQUE: Multiplanar, multiecho pulse sequences of the brain and surrounding structures were obtained without intravenous contrast. COMPARISON:  08/13/2016 FINDINGS: Brain: There is no evidence of acute infarct, intracranial hemorrhage, mass, midline shift, or extra-axial fluid collection. Patchy T2 hyperintensities throughout the cerebral white matter are unchanged and nonspecific but compatible with moderate chronic small  vessel ischemic disease. A chronic lacunar infarct is again noted near the genu of the right internal capsule. There are punctate chronic infarcts in the cerebellum. Mild cerebral atrophy is within normal limits for age. Vascular: Major intracranial vascular flow voids are preserved. Skull and upper cervical spine: Unremarkable bone marrow signal. Sinuses/Orbits: Bilateral cataract extraction. Trace left mastoid effusion. Clear paranasal sinuses. Other: None. IMPRESSION: 1. No acute intracranial abnormality. 2. Moderate chronic small vessel ischemic disease. Electronically Signed   By: Logan Bores M.D.   On: 04/19/2018 12:22     Assessment & Plan:   Acute bronchitis Will treat for acute bronchitis  Patient Instructions  Will order doxycycline Stop azithromycin Take mucinex daily  Use flutter valve Will order Tessalon pearls for cough Please follow up with Dr. Melvyn Novas at first available appointment  General instructions for cough: Continue gastroesophageal reflux disease treatment with elevating the head your bed and taking antacids Prilosec otc 74m  Take 30-60 min before first meal of the day and Pepcid ac (famotidine) 20 mg one @  bedtime until cough is completely gone for at least a week without the need for cough suppression Continue taking over-the-counter antihistamines and nasal fluticasone to help with allergic rhinitis You need to try to suppress your cough to allow your larynx (voice box) to heal.  For three days don't talk, laugh, sing, or clear your throat. Do everything you can to suppress the cough during this time.  Use hard candies (sugarless Jolly Ranchers) or non-mint or non-menthol containing cough drops during this time to soothe your throat.  Use a cough suppressant (Delsym or what I have prescribed you) around the clock during this time.  After three days, gradually increase the use of your voice and back off on the cough suppressants.           Fenton Foy,  NP 05/07/2018

## 2018-05-07 ENCOUNTER — Encounter: Payer: Self-pay | Admitting: Nurse Practitioner

## 2018-05-07 DIAGNOSIS — J209 Acute bronchitis, unspecified: Secondary | ICD-10-CM | POA: Insufficient documentation

## 2018-05-07 NOTE — Assessment & Plan Note (Signed)
Will treat for acute bronchitis  Patient Instructions  Will order doxycycline Stop azithromycin Take mucinex daily  Use flutter valve Will order Tessalon pearls for cough Please follow up with Dr. Melvyn Novas at first available appointment  General instructions for cough: Continue gastroesophageal reflux disease treatment with elevating the head your bed and taking antacids Prilosec otc 20mg   Take 30-60 min before first meal of the day and Pepcid ac (famotidine) 20 mg one @  bedtime until cough is completely gone for at least a week without the need for cough suppression Continue taking over-the-counter antihistamines and nasal fluticasone to help with allergic rhinitis You need to try to suppress your cough to allow your larynx (voice box) to heal.  For three days don't talk, laugh, sing, or clear your throat. Do everything you can to suppress the cough during this time. Use hard candies (sugarless Jolly Ranchers) or non-mint or non-menthol containing cough drops during this time to soothe your throat.  Use a cough suppressant (Delsym or what I have prescribed you) around the clock during this time.  After three days, gradually increase the use of your voice and back off on the cough suppressants.

## 2018-05-20 ENCOUNTER — Encounter: Payer: Self-pay | Admitting: Internal Medicine

## 2018-05-20 ENCOUNTER — Ambulatory Visit (INDEPENDENT_AMBULATORY_CARE_PROVIDER_SITE_OTHER)
Admission: RE | Admit: 2018-05-20 | Discharge: 2018-05-20 | Disposition: A | Discharge status: Home / Self Care | Payer: Medicare Other | Source: Ambulatory Visit | Attending: Internal Medicine | Admitting: Internal Medicine

## 2018-05-20 ENCOUNTER — Ambulatory Visit (INDEPENDENT_AMBULATORY_CARE_PROVIDER_SITE_OTHER): Payer: Medicare Other | Admitting: Internal Medicine

## 2018-05-20 VITALS — BP 146/74 | HR 77 | Ht 64.0 in | Wt 129.0 lb

## 2018-05-20 DIAGNOSIS — J479 Bronchiectasis, uncomplicated: Secondary | ICD-10-CM

## 2018-05-20 DIAGNOSIS — R05 Cough: Secondary | ICD-10-CM | POA: Diagnosis not present

## 2018-05-20 DIAGNOSIS — R058 Other specified cough: Secondary | ICD-10-CM

## 2018-05-20 MED ORDER — METHYLPREDNISOLONE ACETATE 80 MG/ML IJ SUSP
120.0000 mg | Freq: Once | INTRAMUSCULAR | Status: AC
  Administered 2018-05-20: 120 mg via INTRAMUSCULAR

## 2018-05-20 NOTE — Progress Notes (Signed)
Chart and office note reviewed in detail  > agree with a/p as outlined    

## 2018-05-20 NOTE — Patient Instructions (Addendum)
Prilosec 20mg  should be Take 30- 60 min before your first and last meals of the day as long as you are coughing or need  any cough medication   GERD (REFLUX)  is an extremely common cause of respiratory symptoms just like yours , many times with no obvious heartburn at all.    It can be treated with medication, but also with lifestyle changes including elevation of the head of your bed (ideally with 6 inch  bed blocks),  Smoking cessation, avoidance of late meals, excessive alcohol, and avoid fatty foods, chocolate, peppermint, colas, red wine, and acidic juices such as orange juice.  NO MINT OR MENTHOL PRODUCTS SO NO COUGH DROPS   USE SUGARLESS CANDY INSTEAD (Jolley ranchers or Stover's or Life Savers) or even ice chips will also do - the key is to swallow to prevent all throat clearing. NO OIL BASED VITAMINS - use powdered substitutes.    Depomedrol 120 mg IM   For cough mucinex or mucinex dm up 1200 mg every 12 hours and flutter valve and supplement with the hydrocodone  Please remember to go to the  x-ray department at the [facility] building (directly across from [facility])  @ [place]  in the basement  for your tests - we will call you with the results when they are available     Please schedule a follow up office visit in 4 weeks, sooner if needed

## 2018-05-20 NOTE — Progress Notes (Signed)
Subjective:    Patient ID: [name], female    DOB: Apr 04, 1933    MRN: [id number]    Primary Provider/Referring Provider: Dr Doyle Lane   Brief patient profile:  4 yowf never smoker/ MM   with recurrent cough since age 84 with right middle lobe/lingular syndrome with limited bronchiectatic changes on  CT study dated 09/2005    History of Present Illness    03/01/2016  f/u ov/[name] re:  Bronchiectasis with uacs/ some better on gerd rx  Chief Complaint  Patient presents with  . Follow-up    Pt states her dry cough is unchanged since last OV. Pt states she has ocassional chest tightness and SOB but attributes this to stress. Pt also c/o hoarseness.    coughs up to an hour each am, completely non-productive even with flutter  rec No change in medications Nasty mucus > doxycline 100 mg twice daily x 10 days    Please schedule a follow up visit in 6 months but call sooner if needed  Late add for nodular opacity RUL c/w MAI    zmax x 250 mg daily x 30 days then recheck cxr       03/29/2016  f/u ov/[name] re: obst bronchiectasis / uacs maint rx zmax  Chief Complaint  Patient presents with  . Follow-up    CXR was done today. She is coughing less but still having some hoarseness.  She gets SOB walking up stairs.    cough is now dry/  throat is worse off all gerd rx  Doe = MMRC1 = can walk nl pace, flat grade, can't hurry or go uphills or steps s sob   rec Please see patient coordinator before you leave today  to schedule HRCT chest  Continue zithromax daily for now  Pantoprazole (protonix) 40 mg   Take  30-60 min before first meal of the day and tagament 400  one @  Bedtime GERD diet     07/07/2016  f/u ov/[name] re: obst bronchiectasis/ maint rx zmax Chief Complaint  Patient presents with  . Follow-up    Pt states cough is improving but she does c/o hoarseness.   rec Tagamet should be after bfast and supper  GERD diet Leave off zmax          11/30/2017  f/u ov/[name] re:  bronchiectasis / improved cough on gerd diet/ no longer on ppi  Chief Complaint  Patient presents with  . Follow-up    Cough has improved.    Dyspnea: improved / up steps now better  Cough: sporadic daytime/  s excess/ purulent sputum or mucus plugs   SABA use: none 02: none   rec Next time you start coughing for any reason >  mucinex dm up to 1200 mg every 12 hours and the flutter valve and take Try prilosec otc 20mg   Take 30-60 min before first meal of the day and Pepcid ac (famotidine) 20 mg one @  bedtime until cough is completely gone for at least a week without the need for cough suppression GERD  Diet    05/20/2018 acute extended ov/[name] re: refractory cough/ did not start gerd rx as above  Chief Complaint  Patient presents with  . Acute Visit    She is still bothered by the cough- prod with white sputum.    onset Nov  4th  2019 acute vertigo / vomiting/st  > [facility] eval > then by Nov 15th  Weak/light headed cough most dry did not implement  rx for gerd > saw [name] on Nov 19th rec zpak > no better so came to pulmonary office 05/06/18 NP rx Will order doxycycline Stop azithromycin Take mucinex daily  Use flutter valve Will order Tessalon pearls for cough Taking tagamet before bfast and bedtime   05/20/2018 Not limited by breathing from desired activities   Sleeps on 2 pillows but takes hydrocodone  Cough conMucus mostly white / using flutter valve mostly  in am   No obvious day to day or daytime variability or assoc  purulent sputum or mucus plugs or hemoptysis or cp or chest tightness, subjective wheeze or overt sinus or hb symptoms.   sleeping without nocturnal  exacerbation  of respiratory  c/o's or need for noct saba. Also denies any obvious fluctuation of symptoms with weather or environmental changes or other aggravating or alleviating factors except as outlined above   No unusual exposure hx or h/o childhood pna/ asthma or knowledge of premature birth.  Current  Allergies, Complete Past Medical History, Past Surgical History, Family History, and Social History were reviewed in [employer] record.  ROS  The following are not active complaints unless bolded Hoarseness, sore throat, dysphagia, dental problems, itching, sneezing,  nasal congestion or discharge of excess mucus= clear  or purulent secretions, ear ache,   fever, chills, sweats, unintended wt loss or wt gain, classically pleuritic or exertional cp,  orthopnea pnd or arm/hand swelling  or leg swelling, presyncope, palpitations, abdominal pain, anorexia, nausea, vomiting, diarrhea  or change in bowel habits or change in bladder habits, change in stools or change in urine, dysuria, hematuria,  Rash ? From erythromyci, resolving, arthralgias, visual complaints, headache, numbness, weakness or ataxia or problems with walking or coordination,  change in mood or  memory.        Current Meds  Medication Sig  . acetaminophen (TYLENOL) 500 MG tablet Take 1,000 mg by mouth every 6 (six) hours as needed for mild pain.  Marland Kitchen apixaban (ELIQUIS) 2.5 MG TABS tablet Take 2.5 mg by mouth once.   Marland Kitchen HYDROcodone-acetaminophen (NORCO/VICODIN) 5-325 MG per tablet Take 1 tablet by mouth at bedtime.  . meclizine (ANTIVERT) 25 MG tablet Take 0.5 tablets (12.5 mg total) by mouth 2 (two) times daily as needed for dizziness.  . metoprolol succinate (TOPROL-XL) 25 MG 24 hr tablet Take 25 mg by mouth once.   . Multiple Vitamin (MULTI-DAY VITAMINS) TABS Take 1 tablet by mouth daily.                     Past Medical History:  BRONCHIECTASIS (ICD-494.0) see CT scan of the chest dated 09/29/2005  - Pneumovax April 28, 2009 (over 65 so last one)  Prevnar given 04/27/2014   - Alpha one Screen  August 18, 2010 = MM  - Sinus CT August 18, 2010 >>  neg  COUGH, CHRONIC (ICD-786.2)  Left Kidney Cyst  - 04/21/09 [facility] PROCEDURE(S): ULTRASOUND GUIDED AND FLUOROSCOPIC GUIDED LEFT RENAL CYST ASPIRATION AND  SCLEROSIS  LUQ Abd pain 2011...............................................................Marland KitchenSchooler  - CT ABD 07/29/10 no etiology        Objective:   Physical Exam  amb wf nad   Vital signs reviewed - Note on arrival 02 sats  96% on RA     wt 137 > 137 April 28, 2009 > August 18, 2010 137 >  137 09/29/2010 > 06/21/12 wt 134> 140 03/20/2013 > 04/27/2014  139> 08/10/2014  138 > 02/12/2015 136 >05/27/2015  139 >  11/24/2015 133 > 03/01/2016  130 > 05/10/2016 134 > 07/07/2016  134 > 08/28/2017  132> 11/30/2017    131 > 05/20/2018  129     LUNGS: no acc muscle use,  Nl contour chest  Very minimal insp pops/squeaks bilaterally without cough on insp or exp maneuvers  HEENT: nl dentition, turbinates bilaterally, and oropharynx. Nl external ear canals without cough reflex   NECK :  without JVD/Nodes/TM/ nl carotid upstrokes bilaterally   LUNGS: no acc muscle use,  Nl contour chest with minimal insp pops/ squeaks on insp with a few rhonchi on expiraton s wheeze.  CV:  RRR  no s3 or murmur or increase in P2, and no edema   ABD:  soft and nontender with nl inspiratory excursion in the supine position. No bruits or organomegaly appreciated, bowel sounds nl  MS:  Nl gait/ ext warm without deformities, calf tenderness, cyanosis or clubbing No obvious joint restrictions   SKIN: warm and dry without lesions    NEURO:  alert, approp, nl sensorium with  no motor or cerebellar deficits apparent.       CXR PA and Lateral:   05/20/2018 :    I personally reviewed images and   impression as follows:   Coarse markings c/w bronchiectasis but no as dz      Assessment & Plan:

## 2018-05-21 ENCOUNTER — Telehealth: Payer: Self-pay | Admitting: Internal Medicine

## 2018-05-21 ENCOUNTER — Encounter: Payer: Self-pay | Admitting: Internal Medicine

## 2018-05-21 NOTE — Progress Notes (Signed)
LMTCB

## 2018-05-21 NOTE — Telephone Encounter (Signed)
  Notes recorded by Tanda Rockers, MD on 05/21/2018 at 8:18 AM EST Call pt: Reviewed cxr and no acute change so no change in recommendations made at [facility] and spoke with pt letting her know results of cxr. Pt expressed understanding. Nothing further needed.

## 2018-05-21 NOTE — Telephone Encounter (Signed)
Pt states that she may not be avail to answer the phone and is requesting a detailed msg about CXR results be left on her VM.

## 2018-05-21 NOTE — Assessment & Plan Note (Addendum)
Trial of max gerd rx 11/24/2015 >> worse off rx 03/29/2016 and 08/28/2017 > resume ppi qam ac> resolved as of 11/30/2017    Of the three most common causes of  Sub-acute / recurrent or chronic cough, only one (GERD)  can actually contribute to/ trigger  the other two (asthma and post nasal drip syndrome)  and perpetuate the cylce of cough.  While not intuitively obvious, many patients with chronic low grade reflux do not cough until there is a primary insult that disturbs the protective epithelial barrier and exposes sensitive nerve endings.   This is typically viral but can due to PNDS and  either may apply here.   The point is that once this occurs, it is difficult to eliminate the cycle  using anything but a maximally effective acid suppression regimen at least in the short run, accompanied by an appropriate diet to address non acid GERD and control coughing fits as above.   Discussed the recent press about ppi's in the context of a statistically significant (but questionably clinically relevant) increase in CRI in pts on ppi vs h2's > bottom line is the lowest dose of ppi that controls   gerd is the right dose and if that dose is zero that's fine esp since h2's are cheaper.       I had an extended discussion with the patient reviewing all relevant studies completed to date and  lasting 15 to 20 minutes of a 25 minute acute office  visit    Each maintenance medication was reviewed in detail including most importantly the difference between maintenance and prns and under what circumstances the prns are to be triggered using an action plan format that is not reflected in the computer generated alphabetically organized AVS.     Please see AVS for specific instructions unique to this visit that I personally wrote and verbalized to the the pt in detail and then reviewed with pt  by my nurse highlighting any  changes in therapy recommended at today's visit to their plan of care.

## 2018-05-21 NOTE — Assessment & Plan Note (Addendum)
-   Alpha one Screen  August 18, 2010 = MM  - See CT Chest 09/29/05 Stable bronchiectasis in right middle lobe and lingula.  Scattered tree in bud opacities throughout the lungs, right greater than left,  nonspecific.   - PFT's 06/21/2012 1.67 (91%) ratio 64 and no change,  DLCO 77% - Flutter valve added 04/27/2014  - PFTs 05/24/15   FEV1 1.55 (81%) ratio 69 p 6% improvement from saba and dlco 62% ratio 88%  - HRCT 04/04/16 >>> 1. 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 2. An incidental finding of potential clinical significance has been found. Dominant nodule in the right upper lobe, likely infectious/inflammatory in etiology.  - Zmax 250 mg daily 03/01/16 - 05/01/16 improved cough at d/c but diarrhea while on it and no change in nodule RUL > d/c 07/07/16 - 08/28/2017 added back cycles of zpak prn flare of purulent bronchitis > d/c 05/20/2018    zpak no longer effective for exac and not convinced there's infection at this point s/p completion of doxy so rec max rx with flutter/ mucinex dm and hydocodone prn and depomedrol 120 IM for any inflammatory component to the airway problems  And rx gerd for uacs (see separate a/p)

## 2018-06-03 ENCOUNTER — Ambulatory Visit: Payer: Medicare Other | Admitting: Internal Medicine

## 2018-06-04 ENCOUNTER — Ambulatory Visit: Payer: Medicare Other | Admitting: Internal Medicine

## 2018-06-10 ENCOUNTER — Telehealth: Payer: Self-pay | Admitting: Internal Medicine

## 2018-06-10 NOTE — Telephone Encounter (Signed)
Called and spoke with Patient. She stated that she has felt bad since Nov. 2019.  She feels that she is getting worse instead of better. She has a productive cough with green sputum, weakness, weight loss, and fever of 100.9.  She declined appointment with NP's and requested to only see Dr. Melvyn Novas.  Appointment scheduled for 06/11/18 at 1100, with Dr. Melvyn Novas.  Nothing further at this time.

## 2018-06-11 ENCOUNTER — Ambulatory Visit (INDEPENDENT_AMBULATORY_CARE_PROVIDER_SITE_OTHER): Payer: Medicare Other | Admitting: Internal Medicine

## 2018-06-11 ENCOUNTER — Encounter: Payer: Self-pay | Admitting: Internal Medicine

## 2018-06-11 ENCOUNTER — Telehealth: Payer: Self-pay | Admitting: Internal Medicine

## 2018-06-11 DIAGNOSIS — R05 Cough: Secondary | ICD-10-CM

## 2018-06-11 DIAGNOSIS — J479 Bronchiectasis, uncomplicated: Secondary | ICD-10-CM

## 2018-06-11 DIAGNOSIS — R058 Other specified cough: Secondary | ICD-10-CM

## 2018-06-11 LAB — BASIC METABOLIC PANEL
BUN: 16 mg/dL (ref 6–23)
CALCIUM: 9.4 mg/dL (ref 8.4–10.5)
CO2: 28 meq/L (ref 19–32)
CREATININE: 1.03 mg/dL (ref 0.40–1.20)
Chloride: 101 mEq/L (ref 96–112)
GFR: 54.27 mL/min — ABNORMAL LOW (ref >60.00)
GLUCOSE: 88 mg/dL (ref 70–99)
Potassium: 4 mEq/L (ref 3.5–5.1)
Sodium: 136 mEq/L (ref 135–145)

## 2018-06-11 LAB — CBC WITH DIFFERENTIAL/PLATELET
BASOS ABS: 0.1 10*3/uL (ref 0.0–0.1)
Basophils Relative: 1.4 % (ref 0.0–3.0)
EOS ABS: 0.1 10*3/uL (ref 0.0–0.7)
Eosinophils Relative: 1.4 % (ref 0.0–5.0)
HCT: 46 % (ref 36.0–46.0)
Hemoglobin: 15.7 g/dL — ABNORMAL HIGH (ref 12.0–15.0)
LYMPHS ABS: 1.1 10*3/uL (ref 0.7–4.0)
Lymphocytes Relative: 13.9 % (ref 12.0–46.0)
MCHC: 34.1 g/dL (ref 30.0–36.0)
MCV: 93.5 fl (ref 78.0–100.0)
Monocytes Absolute: 0.5 10*3/uL (ref 0.1–1.0)
Monocytes Relative: 6.9 % (ref 3.0–12.0)
NEUTROS ABS: 5.8 10*3/uL (ref 1.4–7.7)
NEUTROS PCT: 76.4 % (ref 43.0–77.0)
PLATELETS: 217 10*3/uL (ref 150.0–400.0)
RBC: 4.92 Mil/uL (ref 3.87–5.11)
RDW: 13.1 % (ref 11.5–15.5)
WBC: 7.6 10*3/uL (ref 4.0–10.5)

## 2018-06-11 LAB — SEDIMENTATION RATE: Sed Rate: 16 mm/hr (ref 0–30)

## 2018-06-11 MED ORDER — LEVOFLOXACIN 500 MG PO TABS: 500.0000 mg | ORAL_TABLET | Freq: Every day | ORAL | 1 refills | Status: DC

## 2018-06-11 MED ORDER — FAMOTIDINE 20 MG PO TABS: ORAL_TABLET | ORAL | Status: DC

## 2018-06-11 MED ORDER — PREDNISONE 10 MG PO TABS: ORAL_TABLET | ORAL | 0 refills | Status: DC

## 2018-06-11 MED ORDER — PANTOPRAZOLE SODIUM 40 MG PO TBEC: 40.0000 mg | DELAYED_RELEASE_TABLET | Freq: Every day | ORAL | 2 refills | Status: DC

## 2018-06-11 NOTE — Telephone Encounter (Signed)
Called and spoke with [place], [facility].  Dr. Melvyn Novas recommendations given.  Understanding stated.  Nothing further at this time.

## 2018-06-11 NOTE — Telephone Encounter (Signed)
Ok to over ride - the allergies that are listed are not of as much severity (even if she gets side effects)  To outweigh the benefits of the drug

## 2018-06-11 NOTE — Patient Instructions (Signed)
Prednisone 10 mg take  4 each am x 2 days,   2 each am x 2 days,  1 each am x 2 days and stop   levaquin 500 mg one daily x 10 days - stops if aches in tendons   Stop prilosec and start protonix 40 mg Take 30-60 min before first meal of the day and pepcid 20mg  after supper   For cough mucinex or mucinex dm up 1200 mg every 12 hours and flutter valve and supplement with the hydrocodone   Please remember to go to the lab department   for your tests - we will call you with the results when they are available.      Keep previous appt

## 2018-06-11 NOTE — Progress Notes (Signed)
Subjective:    Patient ID: [name], female    DOB: December 31, 1932    MRN: [id number]    Primary Provider/Referring Provider: Dr Doyle Askew   Brief patient profile:  85 yowf never smoker/ MM   with recurrent cough since age 85 with right middle lobe/lingular syndrome with limited bronchiectatic changes on  CT study dated 09/2005    History of Present Illness    03/01/2016  f/u ov/Gerald Kuehl re:  Bronchiectasis with uacs/ some better on gerd rx  Chief Complaint  Patient presents with  . Follow-up    Pt states her dry cough is unchanged since last OV. Pt states she has ocassional chest tightness and SOB but attributes this to stress. Pt also c/o hoarseness.    coughs up to an hour each am, completely non-productive even with flutter  rec No change in medications Nasty mucus > doxycline 100 mg twice daily x 10 days    Please schedule a follow up visit in 6 months but call sooner if needed  Late add for nodular opacity RUL c/w MAI    zmax x 250 mg daily x 30 days then recheck cxr       03/29/2016  f/u ov/Aleasha Fregeau re: obst bronchiectasis / uacs maint rx zmax  Chief Complaint  Patient presents with  . Follow-up    CXR was done today. She is coughing less but still having some hoarseness.  She gets SOB walking up stairs.    cough is now dry/  throat is worse off all gerd rx  Doe = MMRC1 = can walk nl pace, flat grade, can't hurry or go uphills or steps s sob   rec Please see patient coordinator before you leave today  to schedule HRCT chest  Continue zithromax daily for now  Pantoprazole (protonix) 40 mg   Take  30-60 min before first meal of the day and tagament 400  one @  Bedtime GERD diet     07/07/2016  f/u ov/Correna Meacham re: obst bronchiectasis/ maint rx zmax Chief Complaint  Patient presents with  . Follow-up    Pt states cough is improving but she does c/o hoarseness.   rec Tagamet should be after bfast and supper  GERD diet Leave off zmax          11/30/2017  f/u ov/Abu Heavin re:  bronchiectasis / improved cough on gerd diet/ no longer on ppi  Chief Complaint  Patient presents with  . Follow-up    Cough has improved.    Dyspnea: improved / up steps now better  Cough: sporadic daytime/  s excess/ purulent sputum or mucus plugs   SABA use: none 02: none   rec Next time you start coughing for any reason >  mucinex dm up to 1200 mg every 12 hours and the flutter valve and take Try prilosec otc 20mg   Take 30-60 min before first meal of the day and Pepcid ac (famotidine) 20 mg one @  bedtime until cough is completely gone for at least a week without the need for cough suppression GERD  Diet    05/20/2018 acute extended ov/Rhiley Tarver re: refractory cough/ did not start gerd rx as above  Chief Complaint  Patient presents with  . Acute Visit    She is still bothered by the cough- prod with white sputum.    onset Nov  4th  2019 acute vertigo / vomiting/st  > [facility] eval > then by Nov 15th  Weak/light headed cough most dry did not implement  rx for gerd > saw Dr Carloyn Manner on Nov 19th rec zpak > no better so came to pulmonary office 05/06/18 NP rx Will order doxycycline Stop azithromycin Take mucinex daily  Use flutter valve Will order Tessalon pearls for cough Taking tagamet before bfast and bedtime   05/20/2018 Not limited by breathing from desired activities   Sleeps on 2 pillows but takes hydrocodone  Cough  Mucus mostly white / using flutter valve mostly  in am  rec Prilosec 20mg  should be Take 30- 60 min before your first and last meals of the day as long as you are coughing or need  any cough medication  GERD diet  Depomedrol 120 mg IM  For cough mucinex or mucinex dm up 1200 mg every 12 hours and flutter valve and supplement with the hydrocodone      06/11/2018 acute  ov/Sidnee Gambrill re: bronchiectasis/ cough really never better since early Nov 2019 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 Acute Visit    severe cough x 2 months with white/yellow/green  congestion. Has been taking medication but not helping.  Dyspnea:  Fine unless coughing  Cough: better at hs then starts up in  am after stirs and all day and lasts all day with variably purulent but never bloody mus   Sleeping: ok bed flat/ 2 pillows  SABA use: none  02: none    No obvious day to day or daytime variability or assoc   mucus plugs or hemoptysis or cp or chest tightness, subjective wheeze or overt sinus or hb symptoms.   Sleeping as above  without nocturnal  or early am exacerbation  of respiratory  c/o's or need for noct saba. Also denies any obvious fluctuation of symptoms with weather or environmental changes or other aggravating or alleviating factors except as outlined above   No unusual exposure hx or h/o childhood pna/ asthma or knowledge of premature birth.  Current Allergies, Complete Past Medical History, Past Surgical History, Family History, and Social History were reviewed in [employer] record.  ROS  The following are not active complaints unless bolded Hoarseness, sore throat, dysphagia, dental problems, itching, sneezing,  nasal congestion or discharge of excess mucus or purulent secretions, ear ache,   fever, chills, sweats, unintended wt loss or wt gain, classically pleuritic or exertional cp,  orthopnea pnd or arm/hand swelling  or leg swelling, presyncope, palpitations, abdominal pain, anorexia, nausea, vomiting, diarrhea  or change in bowel habits or change in bladder habits, change in stools or change in urine, dysuria, hematuria,  rash, arthralgias, visual complaints, headache, numbness, weakness or ataxia or problems with walking or coordination,  change in mood or  memory.        Current Meds  Medication Sig  . acetaminophen (TYLENOL) 500 MG tablet Take 1,000 mg by mouth every 6 (six) hours as needed for mild pain.  Marland Kitchen HYDROcodone-acetaminophen (NORCO/VICODIN) 5-325 MG per tablet Take 1 tablet by mouth at bedtime.  . meclizine  (ANTIVERT) 25 MG tablet Take 0.5 tablets (12.5 mg total) by mouth 2 (two) times daily as needed for dizziness.  . metoprolol succinate (TOPROL-XL) 25 MG 24 hr tablet Take 25 mg by mouth once.   . Multiple Vitamin (MULTI-DAY VITAMINS) TABS Take 1 tablet by mouth daily.                       Past Medical History:  BRONCHIECTASIS (ICD-494.0) see CT scan of  the chest dated 09/29/2005  - Pneumovax April 28, 2009 (over 65 so last one)  Prevnar given 04/27/2014   - Alpha one Screen  August 18, 2010 = MM  - Sinus CT August 18, 2010 >>  neg  COUGH, CHRONIC (ICD-786.2)  Left Kidney Cyst  - 04/21/09 [facility] PROCEDURE(S): ULTRASOUND GUIDED AND FLUOROSCOPIC GUIDED LEFT RENAL CYST ASPIRATION AND SCLEROSIS  LUQ Abd pain 2011...............................................................Marland KitchenSchooler  - CT ABD 07/29/10 no etiology        Objective:   Physical Exam  amb wf immaculately  dressed  Vital signs reviewed - Note on arrival 02 sats  97% on RA      wt 137 > 137 April 28, 2009 > August 18, 2010 137 >  137 09/29/2010 > 06/21/12 wt 134> 140 03/20/2013 > 04/27/2014  139> 08/10/2014  138 > 02/12/2015 136 >05/27/2015  139 > 11/24/2015 133 > 03/01/2016  130 > 05/10/2016 134 > 07/07/2016  134 > 08/28/2017  132> 11/30/2017    131 > 05/20/2018  129 > 06/11/2018  123  Vital signs reviewed - Note on arrival 02 sats  97% on RA        LUNGS: no acc muscle use,  Nl contour chest with minimal insp pops/ squeaks on insp with a few rhonchi on expiraton s wheeze.     HEENT: nl dentition, turbinates bilaterally, and oropharynx. Nl external ear canals without cough reflex   NECK :  without JVD/Nodes/TM/ nl carotid upstrokes bilaterally   LUNGS: no acc muscle use,  Nl contour chest with insp pops/squeaks and minimal exp rhonchi bilaterally s egophony or bronchial changes or dullness to percussion    CV:  RRR  no s3 or murmur or increase in P2, and no edema   ABD:  soft and nontender with nl inspiratory excursion  in the supine position. No bruits or organomegaly appreciated, bowel sounds nl  MS:  Nl gait/ ext warm without deformities, calf tenderness, cyanosis or clubbing No obvious joint restrictions   SKIN: warm and dry without lesions    NEURO:  alert, approp, nl sensorium with  no motor or cerebellar deficits apparent.       Labs ordered/ reviewed:      Chemistry      Component Value Date/Time   NA 136 06/11/2018 1151   K 4.0 06/11/2018 1151   CL 101 06/11/2018 1151   CO2 28 06/11/2018 1151   BUN 16 06/11/2018 1151   CREATININE 1.03 06/11/2018 1151   CREATININE 0.84 05/11/2015 1009      Component Value Date/Time   CALCIUM 9.4 06/11/2018 1151   ALKPHOS 48 04/19/2018 0934   AST 24 04/19/2018 0934   ALT 18 04/19/2018 0934   BILITOT 0.6 04/19/2018 0934        Lab Results  Component Value Date   WBC 7.6 06/11/2018   HGB 15.7 (H) 06/11/2018   HCT 46.0 06/11/2018   MCV 93.5 06/11/2018   PLT 217.0 06/11/2018       EOS                                                              0.1  06/11/2018            Assessment & Plan:

## 2018-06-11 NOTE — Telephone Encounter (Signed)
Dr. Melvyn Novas, the pharmacist is stating the prednisone and Levaquin are listed on her allergy list. Do you want me to override this. Here are the allergies we have listed below. I don't see prednisone on our list. I called pt but there was no answer.   Allergies  Allergen Reactions  . Ciprofloxacin Other (See Comments)    triggered a migraine that was thought to be a TIA  . Penicillins Rash    Has patient had a PCN reaction causing immediate rash, facial/tongue/throat swelling, SOB or lightheadedness with hypotension: Yes Has patient had a PCN reaction causing severe rash involving mucus membranes or skin necrosis: No Has patient had a PCN reaction that required hospitalization: No Has patient had a PCN reaction occurring within the last 10 years: No If all of the above answers are "NO", then may proceed with Cephalosporin use.  . Sulfonamide Derivatives Swelling  . Avelox [Moxifloxacin] Other (See Comments)    Rash and severe tingling/numbness   . Reclast [Zoledronic Acid] Other (See Comments)    hypertension  . Chlorpheniramine Other (See Comments)    Headache, stomach ache, bladder "not working well"

## 2018-06-11 NOTE — Progress Notes (Signed)
Spoke with pt and notified of results per Dr. Wert. Pt verbalized understanding and denied any questions. 

## 2018-06-12 ENCOUNTER — Encounter: Payer: Self-pay | Admitting: Internal Medicine

## 2018-06-12 NOTE — Assessment & Plan Note (Addendum)
Trial of max gerd rx 11/24/2015 >> worse off rx 03/29/2016 and 08/28/2017 > resume ppi qam ac> resolved as of 11/30/2017  Then flared again 04/2018 > rechallenged 06/11/2018   Could have asthmatic component so rec pred x 6 days but of the three most common causes of  Sub-acute / recurrent or chronic cough, only one (GERD)  can actually contribute to/ trigger  the other two (asthma and post nasal drip syndrome)  and perpetuate the cylce of cough.  While not intuitively obvious, many patients with chronic low grade reflux do not cough until there is a primary insult that disturbs the protective epithelial barrier and exposes sensitive nerve endings.   This is typically viral but can due to PNDS and  either may apply here.    >>>  The point is that once this occurs, it is difficult to eliminate the cycle  using anything but a maximally effective acid suppression regimen at least in the short run, accompanied by an appropriate diet to address non acid GERD and control / eliminate the cough itself for at least 3 days with hydrocodone and use flutter valve as much as possible year round.   I had an extended discussion with the patient reviewing all relevant studies completed to date and  lasting 15 to 20 minutes of a 25 minute acute office visit    Each maintenance medication was reviewed in detail including most importantly the difference between maintenance and prns and under what circumstances the prns are to be triggered using an action plan format that is not reflected in the computer generated alphabetically organized AVS.     Please see AVS for specific instructions unique to this visit that I personally wrote and verbalized to the the pt in detail and then reviewed with pt  by my nurse highlighting any  changes in therapy recommended at today's visit to their plan of care.

## 2018-06-12 NOTE — Assessment & Plan Note (Signed)
-   Alpha one Screen  August 18, 2010 = MM  - See CT Chest 09/29/05 Stable bronchiectasis in right middle lobe and lingula.  Scattered tree in bud opacities throughout the lungs, right greater than left,  nonspecific.   - PFT's 06/21/2012 1.67 (91%) ratio 64 and no change,  DLCO 77% - Flutter valve added 04/27/2014  - PFTs 05/24/15   FEV1 1.55 (81%) ratio 69 p 6% improvement from saba and dlco 62% ratio 88%  - HRCT 04/04/16 >>> 1. 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 2. An incidental finding of potential clinical significance has been found. Dominant nodule in the right upper lobe, likely infectious/inflammatory in etiology.  - Zmax 250 mg daily 03/01/16 - 05/01/16 improved cough at d/c but diarrhea while on it and no change in nodule RUL > d/c 07/07/16 - 08/28/2017 added back cycles of zpak prn flare of purulent bronchitis > d/c 05/20/2018      zpak no longer effective with variably purulent sputum suggesting progressive mai or opportunistic gnr > note multiple abx allergy/ intol so no good choice here but suspect she could take low dose levaquin so try 500 mg x 5 day then extend to 10 if tolerating   If not better then ct/ fob next steps   Discussed in detail all the  indications, usual  risks and alternatives  relative to the benefits with patient who agrees to proceed with rx then w/u as outlined.

## 2018-06-18 DIAGNOSIS — H04123 Dry eye syndrome of bilateral lacrimal glands: Secondary | ICD-10-CM | POA: Diagnosis not present

## 2018-06-18 DIAGNOSIS — Z961 Presence of intraocular lens: Secondary | ICD-10-CM | POA: Diagnosis not present

## 2018-06-24 ENCOUNTER — Encounter: Payer: Self-pay | Admitting: Internal Medicine

## 2018-06-24 ENCOUNTER — Ambulatory Visit (INDEPENDENT_AMBULATORY_CARE_PROVIDER_SITE_OTHER)
Admission: RE | Admit: 2018-06-24 | Discharge: 2018-06-24 | Disposition: A | Discharge status: Home / Self Care | Payer: Medicare Other | Source: Ambulatory Visit | Attending: Internal Medicine | Admitting: Internal Medicine

## 2018-06-24 ENCOUNTER — Ambulatory Visit (INDEPENDENT_AMBULATORY_CARE_PROVIDER_SITE_OTHER): Payer: Medicare Other | Admitting: Internal Medicine

## 2018-06-24 VITALS — BP 148/82 | HR 81 | Ht 64.0 in | Wt 125.6 lb

## 2018-06-24 DIAGNOSIS — J479 Bronchiectasis, uncomplicated: Secondary | ICD-10-CM

## 2018-06-24 DIAGNOSIS — R05 Cough: Secondary | ICD-10-CM | POA: Diagnosis not present

## 2018-06-24 DIAGNOSIS — R058 Other specified cough: Secondary | ICD-10-CM

## 2018-06-24 MED ORDER — HYDROCODONE-ACETAMINOPHEN 5-325 MG PO TABS: 1.0000 | ORAL_TABLET | ORAL | 0 refills | Status: DC | PRN

## 2018-06-24 NOTE — Patient Instructions (Addendum)
Take delsym two tsp every 12 hours and supplement if needed with vicodin mg up to 1-2 every 4 hours to suppress the urge to cough. Swallowing water and/or using ice chips/non mint and menthol containing candies (such as lifesavers or sugarless jolly ranchers) are also effective.  You should rest your voice and avoid activities that you know make you cough.  Once you have eliminated the cough for 3 straight days try reducing the vicodin  first,  then the delsym as tolerated.    Use the flutter valve as much as possible and stop mucinex    Change the pepcid to where you take it after supper    Please remember to go to the  x-ray department  for your tests - we will call you with the results when they are available     Please schedule a follow up office visit in 4 weeks, sooner if needed  - bring the flutter valve with you

## 2018-06-24 NOTE — Progress Notes (Signed)
Subjective:    Patient ID: Tesha Archambeau, female    DOB: Jan 04, 1933    MRN: [id number]    Primary Provider/Referring Provider: Dr Doyle Askew   Brief patient profile:  85 yowf never smoker/ MM   with recurrent cough since age 85 with right middle lobe/lingular syndrome with limited bronchiectatic changes on  CT study dated 09/2005    History of Present Illness    03/01/2016  f/u ov/Sarinity Dicicco re:  Bronchiectasis with uacs/ some better on gerd rx  Chief Complaint  Patient presents with  . Follow-up    Pt states her dry cough is unchanged since last OV. Pt states she has ocassional chest tightness and SOB but attributes this to stress. Pt also c/o hoarseness.    coughs up to an hour each am, completely non-productive even with flutter  rec No change in medications Nasty mucus > doxycline 100 mg twice daily x 10 days    Please schedule a follow up visit in 6 months but call sooner if needed  Late add for nodular opacity RUL c/w MAI    zmax x 250 mg daily x 30 days then recheck cxr       03/29/2016  f/u ov/Shakirah Kirkey re: obst bronchiectasis / uacs maint rx zmax  Chief Complaint  Patient presents with  . Follow-up    CXR was done today. She is coughing less but still having some hoarseness.  She gets SOB walking up stairs.    cough is now dry/  throat is worse off all gerd rx  Doe = MMRC1 = can walk nl pace, flat grade, can't hurry or go uphills or steps s sob   rec Please see patient coordinator before you leave today  to schedule HRCT chest  Continue zithromax daily for now  Pantoprazole (protonix) 40 mg   Take  30-60 min before first meal of the day and tagament 400  one @  Bedtime GERD diet     07/07/2016  f/u ov/Marly Schuld re: obst bronchiectasis/ maint rx zmax Chief Complaint  Patient presents with  . Follow-up    Pt states cough is improving but she does c/o hoarseness.   rec Tagamet should be after bfast and supper  GERD diet Leave off zmax      05/20/2018 cough since 04/2018   Sleeps on 2 pillows but takes hydrocodone  Cough  Mucus mostly white / using flutter valve mostly  in am  rec Prilosec 20mg  should be Take 30- 60 min before your first and last meals of the day as long as you are coughing or need  any cough medication  GERD diet  Depomedrol 120 mg IM  For cough mucinex or mucinex dm up 1200 mg every 12 hours and flutter valve and supplement with the hydrocodone      06/11/2018 acute  ov/Johncarlo Maalouf re: bronchiectasis/ cough really never better since early Nov 2019 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 Acute Visit    severe cough x 2 months with white/yellow/green congestion. Has been taking medication but not helping.  Dyspnea:  Fine unless coughing  Cough: better at hs then starts up in  am after stirs and all day and lasts all day with variably purulent but never bloody mus   Sleeping: ok bed flat/ 2 pillows  rec Prednisone 10 mg take  4 each am x 2 days,   2 each am x 2 days,  1 each am x 2 days and stop  levaquin 500 mg one daily x 10 days - stops if aches in tendons  Stop prilosec and start protonix 40 mg Take 30-60 min before first meal of the day and pepcid 20mg  after supper  For cough mucinex or mucinex dm up 1200 mg every 12 hours and flutter valve and supplement with the hydrocodone      06/24/2018  f/u ov/Cranford Blessinger re: bronchiectasis with cough since early Nov 2019 not typical of her bronchiectasis Chief Complaint  Patient presents with  . Follow-up    cough with white mucus and ocasion green piece, ribs hurt from coughing, feel like eyes swollen   Dyspnea:  No change  = MMRC1 = can walk nl pace, flat grade, can't hurry or go uphills or steps s sob   Cough: dry > wet and day > night and?  nauseated form mucinex dm/ using flutter valve but only hs vicodin Sleeping: sleeping flat on L side s noct symptoms SABA use: none  02: none    No obvious day to day or daytime variability or assoc excess/ purulent sputum or mucus  plugs or hemoptysis or cp or chest tightness, subjective wheeze or overt sinus or hb symptoms.   Sleeping as above  without nocturnal  or early am exacerbation  of respiratory  c/o's or need for noct saba. Also denies any obvious fluctuation of symptoms with weather or environmental changes or other aggravating or alleviating factors except as outlined above   No unusual exposure hx or h/o childhood pna/ asthma or knowledge of premature birth.  Current Allergies, Complete Past Medical History, Past Surgical History, Family History, and Social History were reviewed in [employer] record.  ROS  The following are not active complaints unless bolded Hoarseness, sore throat, dysphagia, dental problems, itching, sneezing,  nasal congestion or discharge of excess mucus or purulent secretions, ear ache,   fever, chills, sweats, unintended wt loss or wt gain, classically pleuritic or exertional cp,  orthopnea pnd or arm/hand swelling  or leg swelling, presyncope, palpitations, abdominal pain, anorexia, nausea, vomiting, diarrhea  or change in bowel habits or change in bladder habits, change in stools or change in urine, dysuria, hematuria,  rash, arthralgias, visual complaints, headache, numbness, weakness or ataxia or problems with walking or coordination,  change in mood or  memory.        Current Meds  Medication Sig  . acetaminophen (TYLENOL) 500 MG tablet Take 1,000 mg by mouth every 6 (six) hours as needed for mild pain.  Marland Kitchen apixaban (ELIQUIS) 2.5 MG TABS tablet Take 2.5 mg by mouth once.   . famotidine (PEPCID) 20 MG tablet One at bedtime  . HYDROcodone-acetaminophen (NORCO/VICODIN) 5-325 MG per tablet Take 1 tablet by mouth at bedtime.  . meclizine (ANTIVERT) 25 MG tablet Take 0.5 tablets (12.5 mg total) by mouth 2 (two) times daily as needed for dizziness.  . metoprolol succinate (TOPROL-XL) 25 MG 24 hr tablet Take 25 mg by mouth once.   . Multiple Vitamin (MULTI-DAY  VITAMINS) TABS Take 1 tablet by mouth daily.  . pantoprazole (PROTONIX) 40 MG tablet Take 1 tablet (40 mg total) by mouth daily. Take 30-60 min before first meal of the day  .     Marland Kitchen                           Past Medical History:  BRONCHIECTASIS (ICD-494.0) see CT scan of the chest dated 09/29/2005  - Pneumovax  April 28, 2009 (over 65 so last one)  Prevnar given 04/27/2014   - Alpha one Screen  August 18, 2010 = MM  - Sinus CT August 18, 2010 >>  neg  COUGH, CHRONIC (ICD-786.2)  Left Kidney Cyst  - 04/21/09 [facility] PROCEDURE(S): ULTRASOUND GUIDED AND FLUOROSCOPIC GUIDED LEFT RENAL CYST ASPIRATION AND SCLEROSIS  LUQ Abd pain 2011...............................................................Marland KitchenSchooler  - CT ABD 07/29/10 no etiology        Objective:   Physical Exam  amb pleasant  wf nad   wt 137 > 137 April 28, 2009 > August 18, 2010 137 >  137 09/29/2010 > 06/21/12 wt 134> 140 03/20/2013 > 04/27/2014  139> 08/10/2014  138 > 02/12/2015 136 >05/27/2015  139 > 11/24/2015 133 > 03/01/2016  130 > 05/10/2016 134 > 07/07/2016  134 > 08/28/2017  132> 11/30/2017    131 > 05/20/2018  129 > 06/11/2018  123 > 06/24/2018  125   Vital signs reviewed - Note on arrival 02 sats  100% on Ra     HEENT: nl dentition, turbinates bilaterally, and oropharynx. Nl external ear canals without cough reflex   NECK :  without JVD/Nodes/TM/ nl carotid upstrokes bilaterally   LUNGS: no acc muscle use,  slt kyphotic  contour chest with min insp pops/ squeaks and no rexp rhonchi  bilaterally without cough on insp or exp maneuvers   CV:  RRR  no s3 or murmur or increase in P2, and no edema   ABD:  soft and nontender with nl inspiratory excursion in the supine position. No bruits or organomegaly appreciated, bowel sounds nl  MS:  Nl gait/ ext warm without deformities, calf tenderness, cyanosis or clubbing No obvious joint restrictions   SKIN: warm and dry without lesions    NEURO:  alert, approp, nl sensorium  with  no motor or cerebellar deficits apparent.         CXR PA and Lateral:   06/24/2018 :    I personally reviewed images and agree with radiology impression as follows:   Unchanged appearance of interstitial prominence with bronchiectasis and multifocal mucous plugging. No acute abnormality.          Assessment & Plan:

## 2018-06-24 NOTE — Progress Notes (Signed)
LMTCB

## 2018-06-24 NOTE — Assessment & Plan Note (Addendum)
-   Alpha one Screen  August 18, 2010 = MM  - See CT Chest 09/29/05 Stable bronchiectasis in right middle lobe and lingula.  Scattered tree in bud opacities throughout the lungs, right greater than left,  nonspecific.   - PFT's 06/21/2012 1.67 (91%) ratio 64 and no change,  DLCO 77% - Flutter valve added 04/27/2014  - PFTs 05/24/15   FEV1 1.55 (81%) ratio 69 p 6% improvement from saba and dlco 62% ratio 88%  - HRCT 04/04/16 >>> 1. 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 2. An incidental finding of potential clinical significance has been found. Dominant nodule in the right upper lobe, likely infectious/inflammatory in etiology.  - Zmax 250 mg daily 03/01/16 - 05/01/16 improved cough at d/c but diarrhea while on it and no change in nodule RUL > d/c 07/07/16 - 08/28/2017 added back cycles of zpak prn flare of purulent bronchitis > d/c 05/20/2018  - Flutter valve training done 06/24/2018    This cough not typical at all for bronchiectasis and much more likely uacs at this point in that it is dry > wet, day > noct or early am.  No further abx needed and no role for laba/ics as did not respond to pred rx.  No role for fob based on cxr review.

## 2018-06-25 ENCOUNTER — Telehealth: Payer: Self-pay | Admitting: Internal Medicine

## 2018-06-25 NOTE — Telephone Encounter (Signed)
.  Advised pt of results. Pt understood and nothing further is needed.    Notes recorded by Tanda Rockers, MD on 06/24/2018 at 5:09 PM EST Call pt: Reviewed cxr and no acute change so no change in recommendations made at [facility]

## 2018-06-26 ENCOUNTER — Telehealth: Payer: Self-pay | Admitting: Internal Medicine

## 2018-06-26 NOTE — Progress Notes (Signed)
Pt was notified of results

## 2018-06-26 NOTE — Telephone Encounter (Signed)
I already spoke to pt, will close encounter.  Me      06/25/18 10:26 AM  Note     .Advised pt of results. Pt understood and nothing further is needed.    Notes recorded by Tanda Rockers, MD on 06/24/2018 at 5:09 PM EST Call pt: Reviewed cxr and no acute change so no change in recommendations made at [facility]

## 2018-07-11 DIAGNOSIS — M810 Age-related osteoporosis without current pathological fracture: Secondary | ICD-10-CM | POA: Diagnosis not present

## 2018-07-11 DIAGNOSIS — Z8262 Family history of osteoporosis: Secondary | ICD-10-CM | POA: Diagnosis not present

## 2018-07-11 DIAGNOSIS — Z90710 Acquired absence of both cervix and uterus: Secondary | ICD-10-CM | POA: Diagnosis not present

## 2018-07-11 DIAGNOSIS — Z1231 Encounter for screening mammogram for malignant neoplasm of breast: Secondary | ICD-10-CM | POA: Diagnosis not present

## 2018-07-22 ENCOUNTER — Encounter: Payer: Self-pay | Admitting: Internal Medicine

## 2018-07-22 ENCOUNTER — Ambulatory Visit (INDEPENDENT_AMBULATORY_CARE_PROVIDER_SITE_OTHER): Payer: Medicare Other | Admitting: Internal Medicine

## 2018-07-22 VITALS — BP 124/74 | HR 90 | Ht 64.0 in | Wt 122.0 lb

## 2018-07-22 DIAGNOSIS — J479 Bronchiectasis, uncomplicated: Secondary | ICD-10-CM

## 2018-07-22 DIAGNOSIS — R05 Cough: Secondary | ICD-10-CM

## 2018-07-22 DIAGNOSIS — R058 Other specified cough: Secondary | ICD-10-CM

## 2018-07-22 NOTE — Patient Instructions (Addendum)
Mucinex dm would be a better choice to take as needed instead of the mucinex or delsym  Vicodin is for emergency purposes when you can't step coughing   Keep the cat out of your bedroom if at all possible   Please schedule a follow up visit in 3 months but call sooner if needed

## 2018-07-22 NOTE — Progress Notes (Signed)
Subjective:    Patient ID: [name], female    DOB: 1933-05-17    MRN: [id number]    Primary Provider/Referring Provider: Dr Doyle Askew   Brief patient profile:  85 yowf never smoker/ MM   with recurrent cough since age 85 with right middle lobe/lingular syndrome with limited bronchiectatic changes on  CT study dated 09/2005    History of Present Illness    03/01/2016  f/u ov/[name] re:  Bronchiectasis with uacs/ some better on gerd rx  Chief Complaint  Patient presents with  . Follow-up    Pt states her dry cough is unchanged since last OV. Pt states she has ocassional chest tightness and SOB but attributes this to stress. Pt also c/o hoarseness.    coughs up to an hour each am, completely non-productive even with flutter  rec No change in medications Nasty mucus > doxycline 100 mg twice daily x 10 days    Please schedule a follow up visit in 6 months but call sooner if needed  Late add for nodular opacity RUL c/w MAI    zmax x 250 mg daily x 30 days then recheck cxr       03/29/2016  f/u ov/[name] re: obst bronchiectasis / uacs maint rx zmax  Chief Complaint  Patient presents with  . Follow-up    CXR was done today. She is coughing less but still having some hoarseness.  She gets SOB walking up stairs.    cough is now dry/  throat is worse off all gerd rx  Doe = MMRC1 = can walk nl pace, flat grade, can't hurry or go uphills or steps s sob   rec Please see patient coordinator before you leave today  to schedule HRCT chest  Continue zithromax daily for now  Pantoprazole (protonix) 40 mg   Take  30-60 min before first meal of the day and tagament 400  one @  Bedtime GERD diet     07/07/2016  f/u ov/[name] re: obst bronchiectasis/ maint rx zmax Chief Complaint  Patient presents with  . Follow-up    Pt states cough is improving but she does c/o hoarseness.   rec Tagamet should be after bfast and supper  GERD diet Leave off maint zmax > no change in pattern       05/20/2018 cough since 04/2018  Sleeps on 2 pillows but takes hydrocodone  Cough  Mucus mostly white / using flutter valve mostly  in am  rec Prilosec 20mg  should be Take 30- 60 min before your first and last meals of the day as long as you are coughing or need  any cough medication  GERD diet  Depomedrol 120 mg IM  For cough mucinex or mucinex dm up 1200 mg every 12 hours and flutter valve and supplement with the hydrocodone      06/11/2018 acute  ov/[name] re: bronchiectasis/ cough really never better since early Nov 2019 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 Acute Visit    severe cough x 2 months with white/yellow/green congestion. Has been taking medication but not helping.  Dyspnea:  Fine unless coughing  Cough: better at hs then starts up in  am after stirs and all day and lasts all day with variably purulent but never bloody mus   Sleeping: ok bed flat/ 2 pillows  rec Prednisone 10 mg take  4 each am x 2 days,   2 each am x 2 days,  1 each  am x 2 days and stop  levaquin 500 mg one daily x 10 days - stops if aches in tendons  Stop prilosec and start protonix 40 mg Take 30-60 min before first meal of the day and pepcid 20mg  after supper  For cough mucinex or mucinex dm up 1200 mg every 12 hours and flutter valve and supplement with the hydrocodone      06/24/2018  f/u ov/[name] re: bronchiectasis with cough since early Nov 2019 not typical of her bronchiectasis Chief Complaint  Patient presents with  . Follow-up    cough with white mucus and ocasion green piece, ribs hurt from coughing, feel like eyes swollen  Dyspnea:  No change  = MMRC1 = can walk nl pace, flat grade, can't hurry or go uphills or steps s sob   Cough: dry > wet and day > night and?  nauseated form mucinex dm/ using flutter valve but only hs vicodin Sleeping: sleeping flat on L side s noct symptoms rec Take delsym two tsp every 12 hours and supplement if needed with vicodin mg  up to 1-2 every 4 hours to suppress the urge to cough.   Once you have eliminated the cough for 3 straight days try reducing the vicodin  first,  then the delsym as tolerated.   Use the flutter valve as much as possible and stop mucinex   Change the pepcid to where you take it after supper     07/22/2018  f/u ov/[name] re: bronchiectasis / cyclical cough since nov 2019 resolved  Chief Complaint  Patient presents with  . Follow-up    Cough has improved.   Dyspnea:  Not limited by breathing from desired activities   Cough: no mucus production/ no longer using delsym or vicodin  Sleeping: bed is flat, 2 pillows, prefers L side  SABA use: none 02: none    No obvious day to day or daytime variability or assoc excess/ purulent sputum or mucus plugs or hemoptysis or cp or chest tightness, subjective wheeze or overt sinus or hb symptoms.    Also denies any obvious fluctuation of symptoms with weather or environmental changes or other aggravating or alleviating factors except as outlined above   No unusual exposure hx or h/o childhood pna/ asthma or knowledge of premature birth.  Current Allergies, Complete Past Medical History, Past Surgical History, Family History, and Social History were reviewed in [employer] record.  ROS  The following are not active complaints unless bolded Hoarseness, sore throat, dysphagia, dental problems, itching, sneezing,  nasal congestion or discharge of excess mucus or purulent secretions, ear ache,   fever, chills, sweats, unintended wt loss or wt gain, classically pleuritic or exertional cp,  orthopnea pnd or arm/hand swelling  or leg swelling, presyncope, palpitations, abdominal pain, anorexia, nausea, vomiting, diarrhea  or change in bowel habits or change in bladder habits, change in stools or change in urine, dysuria, hematuria,  rash, arthralgias, visual complaints, headache, numbness, weakness or ataxia or problems with walking or  coordination,  change in mood or  memory.        Current Meds  Medication Sig  . acetaminophen (TYLENOL) 500 MG tablet Take 1,000 mg by mouth every 6 (six) hours as needed for mild pain.  . famotidine (PEPCID) 20 MG tablet One at bedtime  . HYDROcodone-acetaminophen (NORCO/VICODIN) 5-325 MG tablet Take 1 tablet by mouth every 4 (four) hours as needed for moderate pain.  . meclizine (ANTIVERT) 25 MG tablet Take 0.5  tablets (12.5 mg total) by mouth 2 (two) times daily as needed for dizziness.  . Multiple Vitamin (MULTI-DAY VITAMINS) TABS Take 1 tablet by mouth daily.  . pantoprazole (PROTONIX) 40 MG tablet Take 1 tablet (40 mg total) by mouth daily. Take 30-60 min before first meal of the day               Past Medical History:  BRONCHIECTASIS (ICD-494.0) see CT scan of the chest dated 09/29/2005  - Pneumovax April 28, 2009 (over 65 so last one)  Prevnar given 04/27/2014   - Alpha one Screen  August 18, 2010 = MM  - Sinus CT August 18, 2010 >>  neg  COUGH, CHRONIC (ICD-786.2)  Left Kidney Cyst  - 04/21/09 [facility] PROCEDURE(S): ULTRASOUND GUIDED AND FLUOROSCOPIC GUIDED LEFT RENAL CYST ASPIRATION AND SCLEROSIS  LUQ Abd pain 2011...............................................................Marland KitchenSchooler  - CT ABD 07/29/10 no etiology        Objective:   Physical Exam  amb well dressed  wf nad   wt 137 > 137 April 28, 2009 > August 18, 2010 137 >  137 09/29/2010 > 06/21/12 wt 134> 140 03/20/2013 > 04/27/2014  139> 08/10/2014  138 > 02/12/2015 136 >05/27/2015  139 > 11/24/2015 133 > 03/01/2016  130 > 05/10/2016 134 > 07/07/2016  134 > 08/28/2017  132> 11/30/2017    131 > 05/20/2018  129 > 06/11/2018  123 > 06/24/2018  125 > 07/22/2018  122   Vital signs reviewed - Note on arrival 02 sats  97% on RA        HEENT: nl dentition, turbinates bilaterally, and oropharynx. Nl external ear canals without cough reflex   NECK :  without JVD/Nodes/TM/ nl carotid upstrokes bilaterally   LUNGS: no acc muscle  use,  Nl contour chest with minimal insp pops/squeaks bases  bilaterally without cough on insp or exp maneuvers   CV:  RRR  no s3 or murmur or increase in P2, and no edema   ABD:  soft and nontender with nl inspiratory excursion in the supine position. No bruits or organomegaly appreciated, bowel sounds nl  MS:  Nl gait/ ext warm without deformities, calf tenderness, cyanosis or clubbing No obvious joint restrictions   SKIN: warm and dry without lesions    NEURO:  alert, approp, nl sensorium with  no motor or cerebellar deficits apparent.              Assessment & Plan:

## 2018-07-23 ENCOUNTER — Encounter: Payer: Self-pay | Admitting: Internal Medicine

## 2018-07-23 NOTE — Assessment & Plan Note (Signed)
Trial of max gerd rx 11/24/2015 >> worse off rx 03/29/2016 and 08/28/2017 > resume ppi qam ac> resolved as of 11/30/2017   - Flared again 04/2018 > rechallenged 06/11/2018 and did not follow instructions  Reviewed with Pt: The standardized cough guidelines published in Chest by Lissa Morales in 2006 are still the best available and consist of a multiple step process (up to 12!),  not a single office visit,  and are intended  to address this problem logically,  with an alogrithm dependent on response to empiric treatment at  each progressive step  to determine a specific diagnosis with  minimal addtional testing needed. Therefore if adherence is an issue or can't be accurately verified,  it's very unlikely the standard evaluation and treatment will be successful here.    Furthermore, response to therapy (other than acute cough suppression, which should only be used short term with avoidance of narcotic containing cough syrups if possible), can be a gradual process for which the patient is not likely to  perceive immediate benefit.  Unlike going to an eye doctor where the best perscription is almost always the first one and is immediately effective, this is almost never the case in the management of chronic cough syndromes. Therefore the patient needs to commit up front to consistently adhere to recommendations  for up to 6 weeks of therapy directed at the likely underlying problem(s) before the response can be reasonably evaluated.   Will try again with delsym/ flutter valve/ max rx for gerd and use vicodin to eliminate the cycle of cough inducing cough as discussed last ov but not done correctly.   Return in 4 weeks with flutter valve to assure using correctly     I had an extended discussion with the patient reviewing all relevant studies completed to date and  lasting 15 to 20 minutes of a 25 minute acute office  visit    See device teaching (flutter valve)which extended face to face time for this  visit.  Each maintenance medication was reviewed in detail including emphasizing most importantly the difference between maintenance and prns and under what circumstances the prns are to be triggered using an action plan format that is not reflected in the computer generated alphabetically organized AVS which I have not found useful in most complex patients, especially with respiratory illnesses  Please see AVS for specific instructions unique to this visit that I personally wrote and verbalized to the the pt in detail and then reviewed with pt  by my nurse highlighting any  changes in therapy recommended at today's visit to their plan of care.

## 2018-07-23 NOTE — Assessment & Plan Note (Addendum)
-   Alpha one Screen  August 18, 2010 = MM  - See CT Chest 09/29/05 Stable bronchiectasis in right middle lobe and lingula.  Scattered tree in bud opacities throughout the lungs, right greater than left,  nonspecific.   - PFT's 06/21/2012 1.67 (91%) ratio 64 and no change,  DLCO 77% - Flutter valve added 04/27/2014  - PFTs 05/24/15   FEV1 1.55 (81%) ratio 69 p 6% improvement from saba and dlco 62% ratio 88%  - HRCT 04/04/16 >>> 1. 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 2. An incidental finding of potential clinical significance has been found. Dominant nodule in the right upper lobe, likely infectious/inflammatory in etiology.  - Zmax 250 mg daily 03/01/16 - 05/01/16 improved cough at d/c but diarrhea while on it and no change in nodule RUL > d/c 07/07/16 - 08/28/2017 added back cycles of zpak prn flare of purulent bronchitis > d/c 05/20/2018  - Flutter valve training 06/24/18  Adequate control on present rx, reviewed in detail with pt > no change in rx needed

## 2018-07-23 NOTE — Assessment & Plan Note (Signed)
Trial of max gerd rx 11/24/2015 >> worse off rx 03/29/2016 and 08/28/2017 > resume ppi qam ac> resolved as of 11/30/2017   - Flared again 04/2018 > rechallenged 06/11/2018 and did not follow instructions> resolved 07/22/2018      I had an extended discussion with the patient reviewing all relevant studies completed to date and  lasting 10 minutes of a 15 minute visit    Each maintenance medication was reviewed in detail including most importantly the difference between maintenance and prns and under what circumstances the prns are to be triggered using an action plan format that is not reflected in the computer generated alphabetically organized AVS.     Please see AVS for specific instructions unique to this visit that I personally wrote and verbalized to the the pt in detail and then reviewed with pt  by my nurse highlighting any  changes in therapy recommended at today's visit to their plan of care.

## 2018-07-25 DIAGNOSIS — M810 Age-related osteoporosis without current pathological fracture: Secondary | ICD-10-CM | POA: Diagnosis not present

## 2018-07-30 DIAGNOSIS — M47812 Spondylosis without myelopathy or radiculopathy, cervical region: Secondary | ICD-10-CM | POA: Diagnosis not present

## 2018-07-30 DIAGNOSIS — Z6821 Body mass index (BMI) 21.0-21.9, adult: Secondary | ICD-10-CM | POA: Diagnosis not present

## 2018-07-30 DIAGNOSIS — G450 Vertebro-basilar artery syndrome: Secondary | ICD-10-CM | POA: Diagnosis not present

## 2018-08-06 DIAGNOSIS — R1032 Left lower quadrant pain: Secondary | ICD-10-CM | POA: Diagnosis not present

## 2018-08-06 DIAGNOSIS — Z01411 Encounter for gynecological examination (general) (routine) with abnormal findings: Secondary | ICD-10-CM | POA: Diagnosis not present

## 2018-08-06 DIAGNOSIS — Z0000 Encounter for general adult medical examination without abnormal findings: Secondary | ICD-10-CM | POA: Diagnosis not present

## 2018-08-06 DIAGNOSIS — Z1329 Encounter for screening for other suspected endocrine disorder: Secondary | ICD-10-CM | POA: Diagnosis not present

## 2018-08-06 DIAGNOSIS — M810 Age-related osteoporosis without current pathological fracture: Secondary | ICD-10-CM | POA: Diagnosis not present

## 2018-08-06 DIAGNOSIS — Z1151 Encounter for screening for human papillomavirus (HPV): Secondary | ICD-10-CM | POA: Diagnosis not present

## 2018-08-06 DIAGNOSIS — Z13220 Encounter for screening for lipoid disorders: Secondary | ICD-10-CM | POA: Diagnosis not present

## 2018-08-06 DIAGNOSIS — Z131 Encounter for screening for diabetes mellitus: Secondary | ICD-10-CM | POA: Diagnosis not present

## 2018-08-06 DIAGNOSIS — Z124 Encounter for screening for malignant neoplasm of cervix: Secondary | ICD-10-CM | POA: Diagnosis not present

## 2018-08-06 DIAGNOSIS — Z01419 Encounter for gynecological examination (general) (routine) without abnormal findings: Secondary | ICD-10-CM | POA: Diagnosis not present

## 2018-08-12 DIAGNOSIS — C4491 Basal cell carcinoma of skin, unspecified: Secondary | ICD-10-CM | POA: Diagnosis not present

## 2018-08-12 DIAGNOSIS — L570 Actinic keratosis: Secondary | ICD-10-CM | POA: Diagnosis not present

## 2018-08-12 DIAGNOSIS — D229 Melanocytic nevi, unspecified: Secondary | ICD-10-CM | POA: Diagnosis not present

## 2018-08-23 ENCOUNTER — Observation Stay (HOSPITAL_COMMUNITY)
Admission: EM | Admit: 2018-08-23 | Discharge: 2018-08-23 | Disposition: A | Discharge status: Home / Self Care | Payer: Medicare Other | Attending: Family Medicine | Admitting: Family Medicine

## 2018-08-23 ENCOUNTER — Other Ambulatory Visit: Payer: Self-pay

## 2018-08-23 ENCOUNTER — Encounter (HOSPITAL_COMMUNITY): Payer: Self-pay

## 2018-08-23 ENCOUNTER — Emergency Department (HOSPITAL_COMMUNITY): Payer: Medicare Other

## 2018-08-23 DIAGNOSIS — I10 Essential (primary) hypertension: Secondary | ICD-10-CM | POA: Diagnosis not present

## 2018-08-23 DIAGNOSIS — Z882 Allergy status to sulfonamides status: Secondary | ICD-10-CM | POA: Insufficient documentation

## 2018-08-23 DIAGNOSIS — I480 Paroxysmal atrial fibrillation: Secondary | ICD-10-CM

## 2018-08-23 DIAGNOSIS — Z881 Allergy status to other antibiotic agents status: Secondary | ICD-10-CM | POA: Diagnosis not present

## 2018-08-23 DIAGNOSIS — R0789 Other chest pain: Secondary | ICD-10-CM | POA: Diagnosis not present

## 2018-08-23 DIAGNOSIS — R531 Weakness: Secondary | ICD-10-CM | POA: Diagnosis not present

## 2018-08-23 DIAGNOSIS — R002 Palpitations: Secondary | ICD-10-CM | POA: Diagnosis not present

## 2018-08-23 DIAGNOSIS — R079 Chest pain, unspecified: Secondary | ICD-10-CM | POA: Diagnosis not present

## 2018-08-23 DIAGNOSIS — Z880 Allergy status to penicillin: Secondary | ICD-10-CM | POA: Diagnosis not present

## 2018-08-23 DIAGNOSIS — Z79899 Other long term (current) drug therapy: Secondary | ICD-10-CM | POA: Insufficient documentation

## 2018-08-23 DIAGNOSIS — K219 Gastro-esophageal reflux disease without esophagitis: Secondary | ICD-10-CM | POA: Diagnosis not present

## 2018-08-23 DIAGNOSIS — R0902 Hypoxemia: Secondary | ICD-10-CM | POA: Diagnosis not present

## 2018-08-23 DIAGNOSIS — R000 Tachycardia, unspecified: Secondary | ICD-10-CM | POA: Diagnosis not present

## 2018-08-23 DIAGNOSIS — Z888 Allergy status to other drugs, medicaments and biological substances status: Secondary | ICD-10-CM | POA: Insufficient documentation

## 2018-08-23 DIAGNOSIS — I959 Hypotension, unspecified: Secondary | ICD-10-CM | POA: Diagnosis not present

## 2018-08-23 DIAGNOSIS — R42 Dizziness and giddiness: Secondary | ICD-10-CM | POA: Diagnosis not present

## 2018-08-23 LAB — CBC WITH DIFFERENTIAL/PLATELET
Abs Immature Granulocytes: 0.12 10*3/uL — ABNORMAL HIGH (ref 0.00–0.07)
BASOS PCT: 1 %
Basophils Absolute: 0.1 10*3/uL (ref 0.0–0.1)
Eosinophils Absolute: 0.1 10*3/uL (ref 0.0–0.5)
Eosinophils Relative: 0 %
HCT: 43.3 % (ref 36.0–46.0)
Hemoglobin: 14.2 g/dL (ref 12.0–15.0)
Immature Granulocytes: 1 %
Lymphocytes Relative: 7 %
Lymphs Abs: 0.8 10*3/uL (ref 0.7–4.0)
MCH: 31.3 pg (ref 26.0–34.0)
MCHC: 32.8 g/dL (ref 30.0–36.0)
MCV: 95.6 fL (ref 80.0–100.0)
Monocytes Absolute: 0.5 10*3/uL (ref 0.1–1.0)
Monocytes Relative: 5 %
NEUTROS ABS: 10.4 10*3/uL — AB (ref 1.7–7.7)
Neutrophils Relative %: 86 %
PLATELETS: 300 10*3/uL (ref 150–400)
RBC: 4.53 MIL/uL (ref 3.87–5.11)
RDW: 13.2 % (ref 11.5–15.5)
WBC: 12 10*3/uL — ABNORMAL HIGH (ref 4.0–10.5)
nRBC: 0 % (ref 0.0–0.2)

## 2018-08-23 LAB — COMPREHENSIVE METABOLIC PANEL
ALT: 17 U/L (ref 0–44)
AST: 22 U/L (ref 15–41)
Albumin: 3.1 g/dL — ABNORMAL LOW (ref 3.5–5.0)
Alkaline Phosphatase: 54 U/L (ref 38–126)
Anion gap: 6 (ref 5–15)
BUN: 18 mg/dL (ref 8–23)
CO2: 23 mmol/L (ref 22–32)
Calcium: 8.2 mg/dL — ABNORMAL LOW (ref 8.9–10.3)
Chloride: 110 mmol/L (ref 98–111)
Creatinine, Ser: 1.03 mg/dL — ABNORMAL HIGH (ref 0.44–1.00)
GFR calc non Af Amer: 50 mL/min — ABNORMAL LOW (ref >60)
GFR, EST AFRICAN AMERICAN: 58 mL/min — AB (ref >60)
Glucose, Bld: 101 mg/dL — ABNORMAL HIGH (ref 70–99)
Potassium: 4.1 mmol/L (ref 3.5–5.1)
Sodium: 139 mmol/L (ref 135–145)
Total Bilirubin: 0.4 mg/dL (ref 0.3–1.2)
Total Protein: 6 g/dL — ABNORMAL LOW (ref 6.5–8.1)

## 2018-08-23 LAB — I-STAT TROPONIN, ED: Troponin i, poc: 0.02 ng/mL (ref 0.00–0.08)

## 2018-08-23 MED ORDER — MORPHINE SULFATE (PF) 2 MG/ML IV SOLN
2.0000 mg | Freq: Once | INTRAVENOUS | Status: AC
  Administered 2018-08-23: 2 mg via INTRAVENOUS
  Filled 2018-08-23: qty 1

## 2018-08-23 MED ORDER — METOPROLOL TARTRATE 25 MG PO TABS: 25.0000 mg | ORAL_TABLET | Freq: Two times a day (BID) | ORAL | 0 refills | Status: DC | PRN

## 2018-08-23 MED ORDER — ONDANSETRON HCL 4 MG/2ML IJ SOLN
4.0000 mg | Freq: Once | INTRAMUSCULAR | Status: AC
  Administered 2018-08-23: 4 mg via INTRAVENOUS
  Filled 2018-08-23: qty 2

## 2018-08-23 NOTE — ED Notes (Signed)
ED TO INPATIENT HANDOFF REPORT  ED Nurse Name and Phone #: Ermalene Searing Name/Age/Gender [name] 85 y.o. female Room/Bed: WA02/WA02  Code Status   Code Status: Not on file  Home/SNF/Other Home Patient oriented to: self, place, time and situation Is this baseline? Yes   Triage Complete: Triage complete  Chief Complaint Weakness; Dizziness  Triage Note Pt arrived via EMS, from home. Pt c/o weakness and dizziness x 1 day  this am. Pt denies n/v/d.  Pt was recently treated with abx. for  Bronchiolitis completed treatment . Pt reports that she had a HR this AM of 130 and was instructed to take Metoprolol PRN RX for management by PCP. Per EMS Pt was Hypotensive at 88/60 and was administered 500 cc bolus and BP improved to 110/60.  Pt is alert and oriented x 4 and is verbally responsive. Pt is able to ambulate independently.    BP 88/60/ 110/60 HR 96, SPO2 100% RA CBG 213.    Allergies Allergies  Allergen Reactions  . Ciprofloxacin Other (See Comments)    triggered a migraine that was thought to be a TIA  . Penicillins Rash    Has patient had a PCN reaction causing immediate rash, facial/tongue/throat swelling, SOB or lightheadedness with hypotension: Yes Has patient had a PCN reaction causing severe rash involving mucus membranes or skin necrosis: No Has patient had a PCN reaction that required hospitalization: No Has patient had a PCN reaction occurring within the last 10 years: No If all of the above answers are "NO", then may proceed with Cephalosporin use.  . Sulfonamide Derivatives Swelling  . Avelox [Moxifloxacin] Other (See Comments)    Rash and severe tingling/numbness   . Reclast [Zoledronic Acid] Other (See Comments)    hypertension  . Chlorpheniramine Other (See Comments)    Headache, stomach ache, bladder "not working well"    Level of Care/Admitting Diagnosis ED Disposition    ED Disposition Condition Comment   [facility] [place] [[id number]]  Level of Care: Telemetry [5]  Admit to tele based on following criteria: Monitor for Ischemic changes  Diagnosis: Chest pain [224825]  Admitting Physician: Patrecia Pour [[id number]]  Attending Physician: Patrecia Pour [phone]  PT Class (Do Not Modify): Observation [104]  PT Acc Code (Do Not Modify): Observation [10022]       B Medical/Surgery History Past Medical History:  Diagnosis Date  . Arm numbness left   . Back pain   . Bronchitis, chronic ([place])   . Depression   . Dizziness   . Fatigue   . Hypertension    mild  . Palpitations   . Skin cancer   . Stress   . Syncope    History of    Past Surgical History:  Procedure Laterality Date  . APPENDECTOMY    . CESAREAN SECTION  [phone]     A IV Location/Drains/Wounds Patient Lines/Drains/Airways Status   Active Line/Drains/Airways    Name:   Placement date:   Placement time:   Site:   Days:   Peripheral IV 08/23/18 Left Antecubital   08/23/18    -    Antecubital   less than 1          Intake/Output Last 24 hours No intake or output data in the 24 hours ending 08/23/18 1602  Labs/Imaging Results for orders placed or performed during the hospital encounter of 08/23/18 (from the past 48 hour(s))  CBC with Differential/Platelet  Status: Abnormal   Collection Time: 08/23/18  2:02 PM  Result Value Ref Range   WBC 12.0 (H) 4.0 - 10.5 K/uL   RBC 4.53 3.87 - 5.11 MIL/uL   Hemoglobin 14.2 12.0 - 15.0 g/dL   HCT 43.3 36.0 - 46.0 %   MCV 95.6 80.0 - 100.0 fL   MCH 31.3 26.0 - 34.0 pg   MCHC 32.8 30.0 - 36.0 g/dL   RDW 13.2 11.5 - 15.5 %   Platelets 300 150 - 400 K/uL   nRBC 0.0 0.0 - 0.2 %   Neutrophils Relative % 86 %   Neutro Abs 10.4 (H) 1.7 - 7.7 K/uL   Lymphocytes Relative 7 %   Lymphs Abs 0.8 0.7 - 4.0 K/uL   Monocytes Relative 5 %   Monocytes Absolute 0.5 0.1 - 1.0 K/uL   Eosinophils Relative 0 %   Eosinophils Absolute 0.1 0.0 - 0.5 K/uL   Basophils Relative 1 %    Basophils Absolute 0.1 0.0 - 0.1 K/uL   Immature Granulocytes 1 %   Abs Immature Granulocytes 0.12 (H) 0.00 - 0.07 K/uL    Comment: Performed at [facility], [place] [address], [place], [place] [address]  Comprehensive metabolic panel     Status: Abnormal   Collection Time: 08/23/18  2:02 PM  Result Value Ref Range   Sodium 139 135 - 145 mmol/L   Potassium 4.1 3.5 - 5.1 mmol/L   Chloride 110 98 - 111 mmol/L   CO2 23 22 - 32 mmol/L   Glucose, Bld 101 (H) 70 - 99 mg/dL   BUN 18 8 - 23 mg/dL   Creatinine, Ser 1.03 (H) 0.44 - 1.00 mg/dL   Calcium 8.2 (L) 8.9 - 10.3 mg/dL   Total Protein 6.0 (L) 6.5 - 8.1 g/dL   Albumin 3.1 (L) 3.5 - 5.0 g/dL   AST 22 15 - 41 U/L   ALT 17 0 - 44 U/L   Alkaline Phosphatase 54 38 - 126 U/L   Total Bilirubin 0.4 0.3 - 1.2 mg/dL   GFR calc non Af Amer 50 (L) >60 mL/min   GFR calc Af Amer 58 (L) >60 mL/min   Anion gap 6 5 - 15    Comment: Performed at [facility], [place] [address], [place], [place] [address]  I-stat troponin, ED     Status: None   Collection Time: 08/23/18  2:12 PM  Result Value Ref Range   Troponin i, poc 0.02 0.00 - 0.08 ng/mL   Comment 3            Comment: Due to the release kinetics of cTnI, a negative result within the first hours of the onset of symptoms does not rule out myocardial infarction with certainty. If myocardial infarction is still suspected, repeat the test at appropriate intervals.    Dg Chest Port 1 View  Result Date: 08/23/2018 CLINICAL DATA:  Weakness and dizziness. Recent antibiotic treatment for bronchiolitis. EXAM: PORTABLE CHEST 1 VIEW COMPARISON:  Chest x-ray dated June 24, 2018. FINDINGS: The heart size and mediastinal contours are within normal limits. Normal pulmonary vascularity. Scattered areas of bronchiectasis and mucous plugging, most prominent in the right lung, peers similar to prior study. No focal consolidation, pleural effusion, or pneumothorax. No acute  osseous abnormality. IMPRESSION: 1. No active disease. 2. Unchanged scattered bronchiectasis and mucous plugging. Electronically Signed   By: Titus Dubin M.D.   On: 08/23/2018 14:39    Pending Labs [employer] (  From admission, onward)   None      Vitals/Pain Today's Vitals   08/23/18 1430 08/23/18 1500 08/23/18 1514 08/23/18 1530  BP: (!) 101/59 (!) 101/58 (!) 101/58 104/65  Pulse: 64 62 64 63  Resp: 17 17 17 13   Temp:    98.1 F (36.7 C)  TempSrc:    Oral  SpO2: 95% 94% 95% 93%    Isolation Precautions No active isolations  Medications Medications  ondansetron (ZOFRAN) injection 4 mg (4 mg Intravenous Given 08/23/18 1413)  morphine 2 MG/ML injection 2 mg (2 mg Intravenous Given 08/23/18 1413)    Mobility walks Low fall risk   Focused Assessments Cardiac Assessment Handoff:    Lab Results  Component Value Date   CKTOTAL 63 01/08/2011   CKMB 2.1 01/08/2011   TROPONINI <0.03 04/12/2018   No results found for: DDIMER Does the Patient currently have chest pain? No     R Recommendations: See Admitting Provider Note  Report given to: [facility]  Additional Notes: Hx of broncholitis

## 2018-08-23 NOTE — ED Notes (Signed)
Patient oxygen on RA 88-90%. Dr. Bonner Puna notified prior to patient discharge.

## 2018-08-23 NOTE — ED Notes (Signed)
Dr. Bonner Puna at patient bedside to assess patient. Patient denies CP/SOB. Patient oxygen saturation holding at 90-91% on RA. Patient verbalizes she will follow up with her doctor for further evaluation of oxygen needs.

## 2018-08-23 NOTE — ED Notes (Signed)
Bed: WA02 Expected date:  Expected time:  Means of arrival:  Comments: EMS-forgot 

## 2018-08-23 NOTE — Consult Note (Signed)
History and Physical   Lucero Auzenne QQV:[id number] DOB: 09-10-1932 DOA: 08/23/2018  Referring MD/NP/PA: Dr. Roderic Palau, [place] PCP: Orpah Melter, MD Outpatient Specialists: Cardiology, Dr. Acie Fredrickson; Pulmonology, Dr. Melvyn Novas.  Patient coming from: Home  Chief Complaint: dizziness, weakness,   HPI: Rudean Icenhour is an 85 y.o. female with a history of PAF no longer on medications, dizziness on prn meclizine, bronchiectasis with chronic cough who presented to the ED due to an abrupt episode of dizziness and weakness. Around 5:30am she noted a discomfort (not pain, not pressure) in the mid-to-upper abdomen that was constant, present at rest and when walking, and associated with diaphoresis, and feeling of inability to get up to walk even a few steps because of lightheadedness (denies rotational vertigo). She never lost consciousness or fell. She also felt palpitations and noted HR in 130-145 range on home equipment. BP was 120-130s/70-80's as usual. She had no trouble breathing through any of this and despite the EDP being told about chest pain radiating to the neck, the patient completely denies that to me. When the episode continued she was instructed to take a tablet of metoprolol which she did in addition to protonix (normal AM med). She's had 3 similar episodes in the past 2 years.   ED Course: Afebrile (98.102F), RR 17/min, HR 82, regular, BP 99/69 with recheck subsequently up to 123/82, 97% on room air. Lab abnormalities include glucose 101, creatinine 1.03 (at baseline), calcium and albumin low at 8.2, 3.1, WBC 12 with an otherwise normal CBC. ECG demonstrates NSR 80bpm, normal axis, a single PVC and no ST deviation. Troponin 0.02. CXR demonstrates no acute findings, stable bronchiectasis with mucus plugging primarily on the right. No significant abnormalities on exam. She was given morphine and EDP requested admission for ACS rule out. The patient is reluctant to remain in the hospital due to risk of  infection.  Review of Systems: No chest pain, no heart failure symptoms including no orthopnea, PND, dyspnea, leg swelling, has had intermittent abdominal pain for which she is scheduled to see Dr. Michail Sermon later this month. Was recently treated for bronchiolitis and reports symptoms from that have resolved. No nausea, vomiting, diarrhea, constipation, myalgias, arthralgias, rash, fever, chills, sick contacts or recent travel, and per HPI. All others reviewed and are negative.   Past Medical History:  Diagnosis Date   Arm numbness left    Back pain    Bronchitis, chronic ([facility])    Depression    Dizziness    Fatigue    Hypertension    mild   Palpitations    Skin cancer    Stress    Syncope    History of    Past Surgical History:  Procedure Laterality Date   APPENDECTOMY     CESAREAN SECTION  [phone]   - Lives alone since husband died late 2016-09-19, currently works full time as an Medical illustrator, has 1 cat, Baldo Ash, who was her husband's. Drinks a glass of whiskey a couple times per week. Had coffee this morning which is not typical.   Allergies  Allergen Reactions   Ciprofloxacin Other (See Comments)    triggered a migraine that was thought to be a TIA   Penicillins Rash    Has patient had a PCN reaction causing immediate rash, facial/tongue/throat swelling, SOB or lightheadedness with hypotension: Yes Has patient had a PCN reaction causing severe rash involving mucus membranes or skin necrosis: No Has patient had a PCN reaction that required hospitalization: No Has patient had a  PCN reaction occurring within the last 10 years: No If all of the above answers are "NO", then may proceed with Cephalosporin use.   Sulfonamide Derivatives Swelling   Avelox [Moxifloxacin] Other (See Comments)    Rash and severe tingling/numbness    Reclast [Zoledronic Acid] Other (See Comments)    hypertension   Chlorpheniramine Other (See Comments)    Headache, stomach  ache, bladder "not working well"   Family History  Problem Relation Age of Onset   Atrial fibrillation Mother    Thyroid disease Mother    Alcohol abuse Father    - FH: Denies early cardiac death. Mother died at 64. Family history otherwise reviewed and not pertinent.  Prior to Admission medications   Medication Sig Start Date End Date Taking? Authorizing Provider  famotidine (PEPCID) 20 MG tablet One at bedtime 06/11/18  Yes Tanda Rockers, MD  HYDROcodone-acetaminophen (NORCO/VICODIN) 5-325 MG tablet Take 1 tablet by mouth every 4 (four) hours as needed for moderate pain. 06/24/18  Yes Tanda Rockers, MD  meclizine (ANTIVERT) 25 MG tablet Take 0.5 tablets (12.5 mg total) by mouth 2 (two) times daily as needed for dizziness. 04/19/18  Yes Khatri, Hina, PA-C  Multiple Vitamin (MULTI-DAY VITAMINS) TABS Take 1 tablet by mouth daily.   Yes [provider]  pantoprazole (PROTONIX) 40 MG tablet Take 1 tablet (40 mg total) by mouth daily. Take 30-60 min before first meal of the day 06/11/18  Yes Tanda Rockers, MD   Physical Exam: Vitals:   08/23/18 1500 08/23/18 1514 08/23/18 1530 08/23/18 1600  BP: (!) 101/58 (!) 101/58 104/65 123/83  Pulse: 62 64 63 70  Resp: 17 17 13 10   Temp:   98.1 F (36.7 C)   TempSrc:   Oral   SpO2: 94% 95% 93% 97%   Constitutional: Pleasant, elderly well-appearing female in no distress. Eyes: Lids and conjunctivae normal, PERRL ENMT: Mucous membranes are moist. Posterior pharynx clear of any exudate or lesions. Fair dentition.  Neck: Normal, supple, no masses, no thyromegaly Respiratory: Non-labored breathing without accessory muscle use. Few expiratory rhonchi R > L without wheeze, crackles, goo air movement.  Cardiovascular: Regular rate and rhythm, no murmurs, rubs, or gallops. No carotid bruits. No JVD. No LE edema. 2+ pedal pulses. Abdomen: Normoactive bowel sounds. No focal tenderness, non-distended, and no masses palpated. No  hepatosplenomegaly. GU: No indwelling catheter Musculoskeletal: No clubbing / cyanosis. No joint deformity upper and lower extremities. Good ROM, no contractures. Normal muscle tone.  Skin: Warm, dry. No rashes, wounds, or ulcers. No significant lesions noted.  Neurologic: CN II-XII grossly intact. Gait steady. Speech normal. No focal deficits in motor strength or sensation in all extremities.  Psychiatric: Alert and oriented x3. Normal judgment and insight. Mood euthymic with congruent affect.   Labs on Admission: I have personally reviewed following labs and imaging studies  CBC: Recent Labs  Lab 08/23/18 1402  WBC 12.0*  NEUTROABS 10.4*  HGB 14.2  HCT 43.3  MCV 95.6  PLT 563   Basic Metabolic Panel: Recent Labs  Lab 08/23/18 1402  NA 139  K 4.1  CL 110  CO2 23  GLUCOSE 101*  BUN 18  CREATININE 1.03*  CALCIUM 8.2*   Liver Function Tests: Recent Labs  Lab 08/23/18 1402  AST 22  ALT 17  ALKPHOS 54  BILITOT 0.4  PROT 6.0*  ALBUMIN 3.1*   Radiological Exams on Admission: Dg Chest Port 1 View  Result Date: 08/23/2018 CLINICAL DATA:  Weakness and dizziness. Recent antibiotic treatment for bronchiolitis. EXAM: PORTABLE CHEST 1 VIEW COMPARISON:  Chest x-ray dated June 24, 2018. FINDINGS: The heart size and mediastinal contours are within normal limits. Normal pulmonary vascularity. Scattered areas of bronchiectasis and mucous plugging, most prominent in the right lung, peers similar to prior study. No focal consolidation, pleural effusion, or pneumothorax. No acute osseous abnormality. IMPRESSION: 1. No active disease. 2. Unchanged scattered bronchiectasis and mucous plugging. Electronically Signed   By: Titus Dubin M.D.   On: 08/23/2018 14:39    EKG: Independently reviewed. NSR 80bpm, PVC x1, no ST deviation.  Assessment/Plan Saydi Kobel is an 85yo F who has bronchiectasis, had PAF mostly around the time of her husband's death and its related physical and  emotional stress, having since stopping regular metoprolol and eliquis, and now has her 3rd episode with nebulous symptoms. These include abdominal discomfort, lightheadedness, weakness, diaphoresis and palpitations. Work up thus far has been unrevealing for the most part. Of note, the EDP was told she had chest pain but she adamantly denies any such complaint. This was not exertional. WBC is mildly up to 12 of unclear significance. She's got no fever or other findings of infection. Was on prednisone earlier this year. She feels very well currently and expresses reservations about observation in the hospital. I feel that reluctance is very reasonable with her age and bronchiectasis.   Without chest pain, troponin elevation, or ischemic ECG changes, I feel ACS is unlikely in a patient with no documented CAD. The patient is reliable, has close follow up, and understands risks involved in this care plan. Also specifically discussed risk of stroke without anticoagulation and AFib. She continues to opt against restarting eliquis or other AC.   I most suspect this was triggered by recurrence of PAF which has since improved since taking metoprolol PTA. I've advised her to follow up closely with Dr. Acie Fredrickson, consider cardiac monitoring, and return if this episode recurs or she does develop chest pain or shortness of breath or fever.   She is to continue treatment for chronic dry cough/GERD per Dr. Melvyn Novas which includes AM PPI, PM pepcid and to continue meclizine prn dizziness.  Patrecia Pour, MD [facility] 08/23/2018, 4:19 PM

## 2018-08-23 NOTE — ED Notes (Signed)
Okay to d/c pt with sats in low 90s per provider.

## 2018-08-23 NOTE — ED Triage Notes (Signed)
Pt arrived via EMS, from home. Pt c/o weakness and dizziness x 1 day  this am. Pt denies n/v/d.  Pt was recently treated with abx. for  Bronchiolitis completed treatment . Pt reports that she had a HR this AM of 130 and was instructed to take Metoprolol PRN RX for management by PCP. Per EMS Pt was Hypotensive at 88/60 and was administered 500 cc bolus and BP improved to 110/60.  Pt is alert and oriented x 4 and is verbally responsive. Pt is able to ambulate independently.    BP 88/60/ 110/60 HR 96, SPO2 100% RA CBG 213.

## 2018-08-23 NOTE — ED Provider Notes (Signed)
[facility] DEPT Provider Note   CSN: [id number] Arrival date & time: 08/23/18  1327    History   Chief Complaint Chief Complaint  Patient presents with  . Dizziness  . Chest Pain    HPI [name] is a 85 y.o. female.     Patient complains that at 530 she awoke and she was having some palpitations then later in the morning she started having some chest pain that radiate up with her neck and was having sweating.  Patient now has mild discomfort to chest  The history is provided by the patient.  Chest Pain  Pain location:  Substernal area Pain quality: aching   Pain radiates to:  Does not radiate Pain severity:  Mild Onset quality:  Sudden Timing:  Constant Progression:  Improving Chronicity:  New Context: not breathing   Relieved by:  Nothing Worsened by:  Nothing Ineffective treatments:  None tried Associated symptoms: no abdominal pain, no back pain, no cough, no fatigue and no headache     Past Medical History:  Diagnosis Date  . Arm numbness left   . Back pain   . Bronchitis, chronic ([place])   . Depression   . Dizziness   . Fatigue   . Hypertension    mild  . Palpitations   . Skin cancer   . Stress   . Syncope    History of     Patient Active Problem List   Diagnosis Date Noted  . Chest pain 08/23/2018  . Acute bronchitis 05/07/2018  . Thyroid nodule 04/06/2016  . Upper airway cough syndrome 11/24/2015  . Near syncope 03/02/2015  . Mastoiditis of both sides 08/10/2014  . Palpitations   . Syncope   . Dizziness   . Hypertension   . Back pain   . Depression   . Stress   . Fatigue   . Arm numbness left   . Skin cancer   . Bronchitis, chronic ([place])   . Obstructive bronchiectasis ([place]) 08/28/2007    Past Surgical History:  Procedure Laterality Date  . APPENDECTOMY    . CESAREAN SECTION  [phone]     OB History   No obstetric history on file.      Home Medications    Prior to Admission  medications   Medication Sig Start Date End Date Taking? Authorizing Provider  famotidine (PEPCID) 20 MG tablet One at bedtime 06/11/18  Yes Tanda Rockers, MD  HYDROcodone-acetaminophen (NORCO/VICODIN) 5-325 MG tablet Take 1 tablet by mouth every 4 (four) hours as needed for moderate pain. 06/24/18  Yes Tanda Rockers, MD  meclizine (ANTIVERT) 25 MG tablet Take 0.5 tablets (12.5 mg total) by mouth 2 (two) times daily as needed for dizziness. 04/19/18  Yes Khatri, Hina, PA-C  Multiple Vitamin (MULTI-DAY VITAMINS) TABS Take 1 tablet by mouth daily.   Yes [provider]  pantoprazole (PROTONIX) 40 MG tablet Take 1 tablet (40 mg total) by mouth daily. Take 30-60 min before first meal of the day 06/11/18  Yes Tanda Rockers, MD    Family History Family History  Problem Relation Age of Onset  . Atrial fibrillation Mother   . Thyroid disease Mother   . Alcohol abuse Father     Social History Social History   Tobacco Use  . Smoking status: Never Smoker  . Smokeless tobacco: Never Used  Substance Use Topics  . Alcohol use: Yes    Comment: social ETOH  . Drug use: No  Allergies   Ciprofloxacin; Penicillins; Sulfonamide derivatives; Avelox [moxifloxacin]; Reclast [zoledronic acid]; and Chlorpheniramine   Review of Systems Review of Systems  Constitutional: Negative for appetite change and fatigue.  HENT: Negative for congestion, ear discharge and sinus pressure.   Eyes: Negative for discharge.  Respiratory: Negative for cough.   Cardiovascular: Positive for chest pain.  Gastrointestinal: Negative for abdominal pain and diarrhea.  Genitourinary: Negative for frequency and hematuria.  Musculoskeletal: Negative for back pain.  Skin: Negative for rash.  Neurological: Negative for seizures and headaches.  Psychiatric/Behavioral: Negative for hallucinations.     Physical Exam Updated Vital Signs BP (!) 101/58 (BP Location: Right Arm)   Pulse 64   Resp 17   SpO2 95%    Physical Exam Vitals signs and nursing note reviewed.  Constitutional:      Appearance: She is well-developed.  HENT:     Head: Normocephalic.     Nose: Nose normal.  Eyes:     General: No scleral icterus.    Conjunctiva/sclera: Conjunctivae normal.  Neck:     Musculoskeletal: Neck supple.     Thyroid: No thyromegaly.  Cardiovascular:     Rate and Rhythm: Normal rate and regular rhythm.     Heart sounds: No murmur. No friction rub. No gallop.   Pulmonary:     Breath sounds: No stridor. No wheezing or rales.  Chest:     Chest wall: No tenderness.  Abdominal:     General: There is no distension.     Tenderness: There is no abdominal tenderness. There is no rebound.  Musculoskeletal: Normal range of motion.  Lymphadenopathy:     Cervical: No cervical adenopathy.  Skin:    Findings: No erythema or rash.  Neurological:     Mental Status: She is oriented to person, place, and time.     Motor: No abnormal muscle tone.     Coordination: Coordination normal.  Psychiatric:        Behavior: Behavior normal.      ED Treatments / Results  Labs (all labs ordered are listed, but only abnormal results are displayed) Labs Reviewed  CBC WITH DIFFERENTIAL/PLATELET - Abnormal; Notable for the following components:      Result Value   WBC 12.0 (*)    Neutro Abs 10.4 (*)    Abs Immature Granulocytes 0.12 (*)    All other components within normal limits  COMPREHENSIVE METABOLIC PANEL - Abnormal; Notable for the following components:   Glucose, Bld 101 (*)    Creatinine, Ser 1.03 (*)    Calcium 8.2 (*)    Total Protein 6.0 (*)    Albumin 3.1 (*)    GFR calc non Af Amer 50 (*)    GFR calc Af Amer 58 (*)    All other components within normal limits  I-STAT TROPONIN, ED    EKG None  Radiology Dg Chest Port 1 View  Result Date: 08/23/2018 CLINICAL DATA:  Weakness and dizziness. Recent antibiotic treatment for bronchiolitis. EXAM: PORTABLE CHEST 1 VIEW COMPARISON:  Chest x-ray  dated June 24, 2018. FINDINGS: The heart size and mediastinal contours are within normal limits. Normal pulmonary vascularity. Scattered areas of bronchiectasis and mucous plugging, most prominent in the right lung, peers similar to prior study. No focal consolidation, pleural effusion, or pneumothorax. No acute osseous abnormality. IMPRESSION: 1. No active disease. 2. Unchanged scattered bronchiectasis and mucous plugging. Electronically Signed   By: Titus Dubin M.D.   On: 08/23/2018 14:39    Procedures  Procedures (including critical care time)  Medications Ordered in ED Medications  ondansetron (ZOFRAN) injection 4 mg (4 mg Intravenous Given 08/23/18 1413)  morphine 2 MG/ML injection 2 mg (2 mg Intravenous Given 08/23/18 1413)     Initial Impression / Assessment and Plan / ED Course  I have reviewed the triage vital signs and the nursing notes.  Pertinent labs & imaging results that were available during my care of the patient were reviewed by me and considered in my medical decision making (see chart for details).  Labs unremarkable.  EKG shows 1 PVC.  She will be admitted for chest pain.  Her pain improved with morphine  Final Clinical Impressions(s) / ED Diagnoses   Final diagnoses:  Atypical chest pain    ED Discharge Orders    None       Milton Ferguson, MD 08/23/18 1534

## 2018-08-27 ENCOUNTER — Telehealth: Payer: Self-pay | Admitting: Cardiovascular Disease

## 2018-08-27 NOTE — Telephone Encounter (Signed)
Called patient back about her message. Patient stated she wanted Dr. Acie Fredrickson to review her last two visits to the hospital, patient stated she is very concerned and not sure what is going on. Patient also stated she needs to follow up with Dr. Acie Fredrickson soon. Informed patient that Dr. Elmarie Shiley nurse would get with her about a possible e-visit if needed.  Patient complaining of weakness and worried about taking metoprolol. Informed patient that the metoprolol is for her to take as needed medication for a HR greater than 110. Patient stated she rarely has that high of a pulse, but last Friday she woke up with her heart just a pounding. Informed patient that if she feels that way again, and her heart rate is high to take the metoprolol. Patient stated she really wants to just hear from Dr. Acie Fredrickson. Informed patient that a message would be sent to him, that her call was being triage to get more information. Patient verbalized understanding and will await to hear back from Dr. Acie Fredrickson and his nurse.

## 2018-08-27 NOTE — Telephone Encounter (Signed)
New Message          Patient is calling to ask Dr. Acie Fredrickson if it's ok to take  "Metoprolol" Patient recv'd this in the Emergency Room.

## 2018-08-28 NOTE — Telephone Encounter (Signed)
[name] states that she has not been feeling very well.  She presented in November with paroxysmal atrial fibrillation.  She has had atrial fibrillation in the past.  She has noted some weight loss.  She has noted that her heart rate is fast.  She went back to the emergency room on March 20.  She was given metoprolol but she has not started taking it yet.  We will arrange for her to have a telemedicine visit on Friday morning at 11:00.  I think that she will need some lab work including a TSH, basic metabolic profile, CBC.     Mertie Moores, MD  08/28/2018 3:59 PM    [address],  [place], [place]  [address] Pager [phone] Phone: [phone]; Fax: [phone]

## 2018-08-30 ENCOUNTER — Other Ambulatory Visit: Payer: Self-pay

## 2018-08-30 ENCOUNTER — Ambulatory Visit (INDEPENDENT_AMBULATORY_CARE_PROVIDER_SITE_OTHER): Payer: Medicare Other | Admitting: Cardiovascular Disease

## 2018-08-30 VITALS — BP 122/73 | HR 71 | Ht 64.0 in | Wt 120.0 lb

## 2018-08-30 DIAGNOSIS — R000 Tachycardia, unspecified: Secondary | ICD-10-CM

## 2018-08-30 DIAGNOSIS — I10 Essential (primary) hypertension: Secondary | ICD-10-CM

## 2018-08-30 MED ORDER — METOPROLOL TARTRATE 25 MG PO TABS: 12.5000 mg | ORAL_TABLET | Freq: Two times a day (BID) | ORAL | 3 refills | Status: DC

## 2018-08-30 NOTE — Progress Notes (Signed)
Virtual Visit via Telephone Note    Evaluation Performed:  Follow-up visit  This visit type was conducted due to national recommendations for restrictions regarding the COVID-19 Pandemic (e.g. social distancing).  This format is felt to be most appropriate for this patient at this time.  All issues noted in this document were discussed and addressed.  No physical exam was performed (except for noted visual exam findings with Video Visits).  Please refer to the patient's chart (MyChart message for video visits and phone note for telephone visits) for the patient's consent to telehealth for [facility].  Date:  08/30/2018   ID:  [name], DOB Aug 05, 1932, MRN [id number]  Patient Location:  [place] [id number]   Provider location:   [place] ,[place]   PCP:  Orpah Melter, MD  Cardiologist:  Mertie Moores, MD  Electrophysiologist:  None   Chief Complaint:  Palpitations, weakness   History of Present Illness:    [name] is a 85 y.o. female who presents via audio/video conferencing for a telehealth visit today.    Has had several episodes of atrial fibrillation.  She was hospitalized in November, 2019 with atrial fibrillation.  .  She notes that her heart rate is fast.  She went back to the to the emergency room on March 20 and was given metoprolol but she did not start it yet before we talked 2 days ago  She started the metoprolol yesterday and HR is better Still losing wt Has lost 12 lbs  Occasional night sweats at night. No diarrhea,   ( has constipation)  Is eating 3 meals a day , Breakfast is toast with PB, juice Lunch - eats ham and cheese , fruit or similar Dinner - beef tips, rice, wine   Seems to be feeling better after starting the metoprolol  Has chronic abdominal pain with lots of gas,  Has been going on a year or so  Has been sick with bronchitis for several months   Wt Readings from Last 3 Encounters:  08/30/18 120  lb (54.4 kg)  07/22/18 122 lb (55.3 kg)  06/24/18 125 lb 9.6 oz (57 kg)   Sept. 10, 2019 - weight was 130       The patient does not symptoms concerning for COVID-19 infection (fever, chills, cough, or new SHORTNESS OF BREATH).    Prior CV studies:   The following studies were reviewed today:    Past Medical History:  Diagnosis Date  . Arm numbness left   . Back pain   . Bronchitis, chronic ([place])   . Depression   . Dizziness   . Fatigue   . Hypertension    mild  . Palpitations   . Skin cancer   . Stress   . Syncope    History of    Past Surgical History:  Procedure Laterality Date  . APPENDECTOMY    . CESAREAN SECTION  [phone]     Current Meds  Medication Sig  . famotidine (PEPCID) 20 MG tablet One at bedtime  . HYDROcodone-acetaminophen (NORCO/VICODIN) 5-325 MG tablet Take 1 tablet by mouth every 4 (four) hours as needed for moderate pain.  . metoprolol tartrate (LOPRESSOR) 25 MG tablet Take 0.5 tablets (12.5 mg total) by mouth 2 (two) times daily.  . Multiple Vitamin (MULTI-DAY VITAMINS) TABS Take 1 tablet by mouth daily.  . pantoprazole (PROTONIX) 40 MG tablet Take 1 tablet (40 mg total) by mouth daily. Take 30-60 min before first  meal of the day     Allergies:   Ciprofloxacin; Penicillins; Sulfonamide derivatives; Avelox [moxifloxacin]; Reclast [zoledronic acid]; and Chlorpheniramine   Social History   Tobacco Use  . Smoking status: Never Smoker  . Smokeless tobacco: Never Used  Substance Use Topics  . Alcohol use: Yes    Comment: social ETOH  . Drug use: No     Family Hx: The patient's family history includes Alcohol abuse in her father; Atrial fibrillation in her mother; Thyroid disease in her mother.  ROS:   Please see the history of present illness.     All other systems reviewed and are negative.   Labs/Other Tests and Data Reviewed:    Recent Labs: 08/23/2018: ALT 17; BUN 18; Creatinine, Ser 1.03; Hemoglobin 14.2; Platelets  300; Potassium 4.1; Sodium 139   Recent Lipid Panel Lab Results  Component Value Date/Time   CHOL 166 01/09/2011 04:55 AM   TRIG 69 01/09/2011 04:55 AM   HDL 55 01/09/2011 04:55 AM   CHOLHDL 3.0 01/09/2011 04:55 AM   LDLCALC 97 01/09/2011 04:55 AM    Wt Readings from Last 3 Encounters:  08/30/18 120 lb (54.4 kg)  07/22/18 122 lb (55.3 kg)  06/24/18 125 lb 9.6 oz (57 kg)     Exam:    Vital Signs:  BP 122/73 (BP Location: Left Arm, Patient Position: Sitting, Cuff Size: Normal)   Pulse 71   Ht 5\' 4"  (1.626 m)   Wt 120 lb (54.4 kg)   BMI 20.60 kg/m     No exam due to the telemedicine visit   ASSESSMENT & PLAN:    1.  Tachycardia:   She seems to be doing much better on metoprolol 12.5 mg twice a day.  I am concerned that she has lost some weight.  I have encouraged her to eat and drink on a regular basis.  If she continues to lose weight then we will need to get a TSH.  On the other hand if she begins to gradually gain weight then the TSH becomes less important.  At this point she is not having any episodes of chest pain.  She has chronic shortness of breath and chronic cough due to her bronchiectasis.  She will follow-up with Dr.  Melvyn Novas   I will see her back in the office in 6 months for follow-up visit.  She will call us in 2 weeks to tell us her weights.  COVID-19 Education: The signs and symptoms of COVID-19 were discussed with the patient and how to seek care for testing (follow up with PCP or arrange E-visit).  The importance of social distancing was discussed today.  Patient Risk:   After full review of this patients clinical status, I feel that they are at least moderate risk at this time.  Time:   Today, I have spent 16  minutes with the patient with telehealth technology discussing  Her tachycardia , meds . Marland Kitchen     Medication Adjustments/Labs and Tests Ordered: Current medicines are reviewed at length with the patient today.  Concerns regarding medicines are outlined  above.  Tests Ordered: No orders of the defined types were placed in this encounter.  Medication Changes: Meds ordered this encounter  Medications  . metoprolol tartrate (LOPRESSOR) 25 MG tablet    Sig: Take 0.5 tablets (12.5 mg total) by mouth 2 (two) times daily.    Dispense:  180 tablet    Refill:  3    Disposition:  in 6 month(s)  Signed, Mertie Moores, MD  08/30/2018 11:25 AM    [facility]

## 2018-08-30 NOTE — Patient Instructions (Signed)
Medication Instructions:  Your physician recommends that you continue on your current medications as directed. Please refer to the Current Medication list given to you today.  If you need a refill on your cardiac medications before your next appointment, please call your pharmacy.    Lab work: Patient is to call back to report weight Patient needs a TSH if weight does not improve    Testing/Procedures: None Ordered   Follow-Up: At [facility], you and your health needs are our priority.  As part of our continuing mission to provide you with exceptional heart care, we have created designated Provider Care Teams.  These Care Teams include your primary Cardiologist (physician) and Advanced Practice Providers (APPs -  Physician Assistants and Nurse Practitioners) who all work together to provide you with the care you need, when you need it. You will need a follow up appointment in:  6 months.  Please call our office 2 months in advance to schedule this appointment.  You may see Mertie Moores, MD or one of the following Advanced Practice Providers on your designated Care Team: Richardson Dopp, PA-C [place], [place] . Daune Perch, NP

## 2018-09-02 ENCOUNTER — Encounter: Payer: Self-pay | Admitting: Cardiovascular Disease

## 2018-09-02 DIAGNOSIS — R000 Tachycardia, unspecified: Secondary | ICD-10-CM | POA: Insufficient documentation

## 2018-09-05 ENCOUNTER — Other Ambulatory Visit: Payer: Self-pay | Admitting: Gastroenterology

## 2018-09-05 DIAGNOSIS — R1084 Generalized abdominal pain: Secondary | ICD-10-CM

## 2018-09-05 DIAGNOSIS — R140 Abdominal distension (gaseous): Secondary | ICD-10-CM | POA: Diagnosis not present

## 2018-09-09 ENCOUNTER — Ambulatory Visit
Admission: RE | Admit: 2018-09-09 | Discharge: 2018-09-09 | Disposition: A | Discharge status: Home / Self Care | Payer: Medicare Other | Source: Ambulatory Visit | Attending: Gastroenterology | Admitting: Gastroenterology

## 2018-09-09 ENCOUNTER — Other Ambulatory Visit: Payer: Self-pay

## 2018-09-09 DIAGNOSIS — R109 Unspecified abdominal pain: Secondary | ICD-10-CM | POA: Diagnosis not present

## 2018-09-09 DIAGNOSIS — R1084 Generalized abdominal pain: Secondary | ICD-10-CM

## 2018-09-09 MED ORDER — IOPAMIDOL (ISOVUE-300) INJECTION 61%
100.0000 mL | Freq: Once | INTRAVENOUS | Status: AC | PRN
  Administered 2018-09-09: 100 mL via INTRAVENOUS

## 2018-09-24 ENCOUNTER — Telehealth: Payer: Self-pay | Admitting: Internal Medicine

## 2018-09-24 NOTE — Telephone Encounter (Signed)
Aware - no surprises or change in rx indicated

## 2018-09-24 NOTE — Telephone Encounter (Signed)
Dr. Melvyn Novas Dr. Michail Sermon wanted you to take a look at CT scan done on 09/09/18. She just wanted you to be aware.

## 2018-09-25 NOTE — Telephone Encounter (Signed)
[facility] but no one answered the phone. Will attempt to call back later.

## 2018-09-26 NOTE — Telephone Encounter (Signed)
[facility]/Dr. Schooler's office x 2 regarding Dr. Gustavus Bryant review of CT note.  Rings but no answer.

## 2018-09-27 NOTE — Telephone Encounter (Signed)
Attempted to contact [facility], no voicemail to leave message. Please make her aware Dr. Melvyn Novas has reviewed the scan.

## 2018-09-30 DIAGNOSIS — F1120 Opioid dependence, uncomplicated: Secondary | ICD-10-CM | POA: Insufficient documentation

## 2018-09-30 DIAGNOSIS — G8929 Other chronic pain: Secondary | ICD-10-CM | POA: Insufficient documentation

## 2018-09-30 DIAGNOSIS — M5481 Occipital neuralgia: Secondary | ICD-10-CM | POA: Insufficient documentation

## 2018-09-30 DIAGNOSIS — M47812 Spondylosis without myelopathy or radiculopathy, cervical region: Secondary | ICD-10-CM | POA: Insufficient documentation

## 2018-09-30 DIAGNOSIS — M4800 Spinal stenosis, site unspecified: Secondary | ICD-10-CM | POA: Insufficient documentation

## 2018-09-30 DIAGNOSIS — M48061 Spinal stenosis, lumbar region without neurogenic claudication: Secondary | ICD-10-CM | POA: Insufficient documentation

## 2018-09-30 NOTE — Telephone Encounter (Signed)
Called [facility] spoke to [facility] Dr. Melvyn Novas message to Dr. Michail Sermon via message. Nothing further needed.

## 2018-10-21 ENCOUNTER — Ambulatory Visit (INDEPENDENT_AMBULATORY_CARE_PROVIDER_SITE_OTHER): Payer: Medicare Other | Admitting: Internal Medicine

## 2018-10-21 ENCOUNTER — Other Ambulatory Visit: Payer: Self-pay

## 2018-10-21 ENCOUNTER — Encounter: Payer: Self-pay | Admitting: Internal Medicine

## 2018-10-21 DIAGNOSIS — R058 Other specified cough: Secondary | ICD-10-CM

## 2018-10-21 DIAGNOSIS — J479 Bronchiectasis, uncomplicated: Secondary | ICD-10-CM | POA: Diagnosis not present

## 2018-10-21 DIAGNOSIS — R05 Cough: Secondary | ICD-10-CM | POA: Diagnosis not present

## 2018-10-21 NOTE — Patient Instructions (Signed)
No change in medications   You need to eat more calorie to maintain your weight by eating more fatty meats/ starches  Please schedule a follow up visit in 3 months but call sooner if needed

## 2018-10-21 NOTE — Progress Notes (Signed)
Subjective:    Patient ID: [name], female    DOB: 1933-05-17    MRN: [id number]    Primary Provider/Referring Provider: Dr Doyle Askew   Brief patient profile:  85 yowf never smoker/ MM   with recurrent cough since age 85 with right middle lobe/lingular syndrome with limited bronchiectatic changes on  CT study dated 09/2005    History of Present Illness    03/01/2016  f/u ov/Derry Kassel re:  Bronchiectasis with uacs/ some better on gerd rx  Chief Complaint  Patient presents with  . Follow-up    Pt states her dry cough is unchanged since last OV. Pt states she has ocassional chest tightness and SOB but attributes this to stress. Pt also c/o hoarseness.    coughs up to an hour each am, completely non-productive even with flutter  rec No change in medications Nasty mucus > doxycline 100 mg twice daily x 10 days    Please schedule a follow up visit in 6 months but call sooner if needed  Late add for nodular opacity RUL c/w MAI    zmax x 250 mg daily x 30 days then recheck cxr       03/29/2016  f/u ov/Pranit Owensby re: obst bronchiectasis / uacs maint rx zmax  Chief Complaint  Patient presents with  . Follow-up    CXR was done today. She is coughing less but still having some hoarseness.  She gets SOB walking up stairs.    cough is now dry/  throat is worse off all gerd rx  Doe = MMRC1 = can walk nl pace, flat grade, can't hurry or go uphills or steps s sob   rec Please see patient coordinator before you leave today  to schedule HRCT chest  Continue zithromax daily for now  Pantoprazole (protonix) 40 mg   Take  30-60 min before first meal of the day and tagament 400  one @  Bedtime GERD diet     07/07/2016  f/u ov/Kamoria Lucien re: obst bronchiectasis/ maint rx zmax Chief Complaint  Patient presents with  . Follow-up    Pt states cough is improving but she does c/o hoarseness.   rec Tagamet should be after bfast and supper  GERD diet Leave off maint zmax > no change in pattern       05/20/2018 cough since 04/2018  Sleeps on 2 pillows but takes hydrocodone  Cough  Mucus mostly white / using flutter valve mostly  in am  rec Prilosec 20mg  should be Take 30- 60 min before your first and last meals of the day as long as you are coughing or need  any cough medication  GERD diet  Depomedrol 120 mg IM  For cough mucinex or mucinex dm up 1200 mg every 12 hours and flutter valve and supplement with the hydrocodone      06/11/2018 acute  ov/Bessye Stith re: bronchiectasis/ cough really never better since early Nov 2019 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 Acute Visit    severe cough x 2 months with white/yellow/green congestion. Has been taking medication but not helping.  Dyspnea:  Fine unless coughing  Cough: better at hs then starts up in  am after stirs and all day and lasts all day with variably purulent but never bloody mus   Sleeping: ok bed flat/ 2 pillows  rec Prednisone 10 mg take  4 each am x 2 days,   2 each am x 2 days,  1 each  am x 2 days and stop  levaquin 500 mg one daily x 10 days - stops if aches in tendons  Stop prilosec and start protonix 40 mg Take 30-60 min before first meal of the day and pepcid 20mg  after supper  For cough mucinex or mucinex dm up 1200 mg every 12 hours and flutter valve and supplement with the hydrocodone      06/24/2018  f/u ov/Derinda Bartus re: bronchiectasis with cough since early Nov 2019 not typical of her bronchiectasis Chief Complaint  Patient presents with  . Follow-up    cough with white mucus and ocasion green piece, ribs hurt from coughing, feel like eyes swollen  Dyspnea:  No change  = MMRC1 = can walk nl pace, flat grade, can't hurry or go uphills or steps s sob   Cough: dry > wet and day > night and?  nauseated form mucinex dm/ using flutter valve but only hs vicodin Sleeping: sleeping flat on L side s noct symptoms rec Take delsym two tsp every 12 hours and supplement if needed with vicodin mg  up to 1-2 every 4 hours to suppress the urge to cough.   Once you have eliminated the cough for 3 straight days try reducing the vicodin  first,  then the delsym as tolerated.   Use the flutter valve as much as possible and stop mucinex   Change the pepcid to where you take it after supper     07/22/2018  f/u ov/Jamilett Ferrante re: bronchiectasis / cyclical cough since nov 2019 resolved  Chief Complaint  Patient presents with  . Follow-up    Cough has improved.   Dyspnea:  Not limited by breathing from desired activities   Cough: no mucus production/ no longer using delsym or vicodin  Sleeping: bed is flat, 2 pillows, prefers L side  SABA use: none 02: none  rec Mucinex dm would be a better choice to take as needed instead of the mucinex or delsym Vicodin is for emergency purposes when you can't step coughing  Keep the cat out of your bedroom if at all possible    Virtual Visit via Telephone Note 10/21/2018 re obst bronchiectasis/ cyclical cough   I connected with [name] on 10/21/18 at  930 am   by telephone and verified that I am speaking with the correct person using two identifiers.   I discussed the limitations, risks, security and privacy concerns of performing an evaluation and management service by telephone and the availability of in person appointments. I also discussed with the patient that there may be a patient responsible charge related to this service. The patient expressed understanding and agreed to proceed.   History of Present Illness: Dyspnea:   sob - walks several hundred feet across parking lot daily  to office/ also walking once daily on street 10-28min and limited by strength/ energy  Cough: better now, cough occurs p waking up, no excess production Sleeping: flat bed with pillows  SABA use: none  02: no  Eating well but lots of lean foods and "don't know why not able to gain wt"   No obvious day to day or daytime variability or assoc excess/ purulent sputum  or mucus plugs or hemoptysis or cp or chest tightness, subjective wheeze or overt sinus or hb symptoms.    Also denies any obvious fluctuation of symptoms with weather or environmental changes or other aggravating or alleviating factors except as outlined above.   Meds reviewed/ med reconciliation completed  Observations/Objective: Sounds great on phone, speaking full sentences with chronic pattern of freq nervous laughter but not cough at all    Assessment and Plan: See problem list for active a/p's    I personally reviewed images and agree with radiology impression as follows:   Chest CT 09/09/2018 Increased tree-in-bud nodularity in right lower lobe since 2016   Follow Up Instructions: See avs for instructions unique to this ov which includes revised/ updated med list     I discussed the assessment and treatment plan with the patient. The patient was provided an opportunity to ask questions and all were answered. The patient agreed with the plan and demonstrated an understanding of the instructions.   The patient was advised to call back or seek an in-person evaluation if the symptoms worsen or if the condition fails to improve as anticipated.  I provided 25 minutes of non-face-to-face time during this encounter.   Christinia Gully, MD               Past Medical History:  BRONCHIECTASIS (ICD-494.0) see CT scan of the chest dated 09/29/2005  - Pneumovax April 28, 2009 (over 65 so last one)  Prevnar given 04/27/2014   - Alpha one Screen  August 18, 2010 = MM  - Sinus CT August 18, 2010 >>  neg  COUGH, CHRONIC (ICD-786.2)  Left Kidney Cyst  - 04/21/09 [facility] PROCEDURE(S): ULTRASOUND GUIDED AND FLUOROSCOPIC GUIDED LEFT RENAL CYST ASPIRATION AND SCLEROSIS  LUQ Abd pain 2011...............................................................Marland KitchenSchooler  - CT ABD 07/29/10 no etiology        Objective:   Physical Exam  amb well dressed  wf nad   wt 137 > 137 April 28, 2009 > August 18, 2010 137 >  137 09/29/2010 > 06/21/12 wt 134> 140 03/20/2013 > 04/27/2014  139> 08/10/2014  138 > 02/12/2015 136 >05/27/2015  139 > 11/24/2015 133 > 03/01/2016  130 > 05/10/2016 134 > 07/07/2016  134 > 08/28/2017  132> 11/30/2017    131 > 05/20/2018  129 > 06/11/2018  123 > 06/24/2018  125 > 07/22/2018  122   Vital signs reviewed - Note on arrival 02 sats  97% on RA        HEENT: nl dentition, turbinates bilaterally, and oropharynx. Nl external ear canals without cough reflex   NECK :  without JVD/Nodes/TM/ nl carotid upstrokes bilaterally   LUNGS: no acc muscle use,  Nl contour chest with minimal insp pops/squeaks bases  bilaterally without cough on insp or exp maneuvers                Assessment & Plan:

## 2018-10-21 NOTE — Assessment & Plan Note (Signed)
Trial of max gerd rx 11/24/2015 >> worse off rx 03/29/2016 and 08/28/2017 > resume ppi qam ac> resolved as of 11/30/2017   - Flared again 04/2018 > rechallenged 06/11/2018 and did not follow instructions> resolved 07/22/2018   Improved with chronic gerd rx to minimal sporadic dry daytime cough s noct flares no longer needing cough suppression so rec no change in rx   Each maintenance medication was reviewed in detail including most importantly the difference between maintenance and as needed and under what circumstances the prns are to be used.  Please see AVS for specific  Instructions which are unique to this visit and I personally typed out  which were reviewed in detail over the phone  with the patient and a mailed copy to be provided.

## 2018-10-21 NOTE — Assessment & Plan Note (Addendum)
Onset ? Age 85 wit bronchiectasis first confirmed 2007  - Alpha one Screen  August 18, 2010 = MM  - See CT Chest 09/29/05 Stable bronchiectasis in right middle lobe and lingula.  Scattered tree in bud opacities throughout the lungs, right greater than left,  nonspecific.   - PFT's 06/21/2012 1.67 (91%) ratio 64 and no change,  DLCO 77% - Flutter valve added 04/27/2014  - PFTs 05/24/15   FEV1 1.55 (81%) ratio 69 p 6% improvement from saba and dlco 62% ratio 88%  - HRCT 04/04/16 >>> 1. 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 2. An incidental finding of potential clinical significance has been found. Dominant nodule in the right upper lobe, likely infectious/inflammatory in etiology.  - Zmax 250 mg daily 03/01/16 - 05/01/16 improved cough at d/c but diarrhea while on it and no change in nodule RUL > d/c 07/07/16 - 08/28/2017 added back cycles of zpak prn flare of purulent bronchitis > d/c 05/20/2018  - flutter valve training 06/24/18    Ct from 09/09/2018 reviewed and as is excpected does show mild progression of likely MAI since 2016 but pt not likely to tolerate more aggressive rx as aleady intolerant of zpak so hold off change in rx unless clinically worsening or "macroscopic" changes on plain cxr at f/u in 3 m planned.

## 2018-12-13 DIAGNOSIS — H903 Sensorineural hearing loss, bilateral: Secondary | ICD-10-CM | POA: Diagnosis not present

## 2019-03-10 ENCOUNTER — Encounter

## 2019-03-11 ENCOUNTER — Ambulatory Visit (INDEPENDENT_AMBULATORY_CARE_PROVIDER_SITE_OTHER): Payer: Medicare Other | Admitting: Cardiovascular Disease

## 2019-03-11 ENCOUNTER — Other Ambulatory Visit: Payer: Self-pay

## 2019-03-11 ENCOUNTER — Encounter: Payer: Self-pay | Admitting: Cardiovascular Disease

## 2019-03-11 VITALS — BP 125/85 | HR 86 | Ht 64.0 in | Wt 119.8 lb

## 2019-03-11 DIAGNOSIS — I10 Essential (primary) hypertension: Secondary | ICD-10-CM | POA: Diagnosis not present

## 2019-03-11 DIAGNOSIS — R000 Tachycardia, unspecified: Secondary | ICD-10-CM

## 2019-03-11 DIAGNOSIS — I480 Paroxysmal atrial fibrillation: Secondary | ICD-10-CM | POA: Diagnosis not present

## 2019-03-11 DIAGNOSIS — R002 Palpitations: Secondary | ICD-10-CM | POA: Diagnosis not present

## 2019-03-11 MED ORDER — METOPROLOL TARTRATE 25 MG PO TABS: 12.5000 mg | ORAL_TABLET | Freq: Two times a day (BID) | ORAL | 3 refills | Status: DC

## 2019-03-11 NOTE — Progress Notes (Signed)
[name]  Date of Birth  Jul 27, 1932 [facility] [id number], [place]  [address] [phone]  Fax  [phone]   P roblem list: 1. Palpitations 2. History of syncope 3. Dizziness 4. Mild hypertension  [name] is an elderly female with a history of palpitations and history of dizziness. She has mild hypertension. She's done very well since I last saw her. She has not had any episodes of chest pain or shortness of breath. She has had some leg discomfort particularly on busy days.  Sept. 27, 2016: [name] is seen back for office visit. Has had some lightheadedness on occasion .  Lasts for a few seconds  Occurs at various times, not associated with any particular activity .  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   Feb. 1, 2017 Has been under lots of stress,  Husband had neck surgery and it was complicated by some paralysis  Has been running back and forth to [facility] (Dr. Carloyn Manner did the surgery)    Sept. 18, 2017:  [name] is seen back for follow up visit. Has palpitations. She will wore a monitor but was only able to tolerate for 2 days. She did not have any significant arrhythmias during this 2 days of monitoring.  Bp is elevated here BP readings at home are normal at home  Palpitations seem to be well controlled.   Nov. 12, 2018:    [name] is seen today  Was seen 2 weeks ago in the ER with rapid Afib.  Her husband passed away 1 week ago .  BP is a bit high  No additional episodes of atrial fibrillation  February 12, 2018: Seen today for follow-up visit.  She has a history of paroxysmal atrial fibrillation. Has chronic bronchiactasis - sees Dr. Melvyn Novas.  No further episodes of Afib    March 11, 2019:  [name] is seen today for follow-up of her paroxysmal atrial fibrillation and hypertension.  She also has a history of bronchiectasis.  She was seen in March, 2020 by  telemedicine visit  .  Her tachycardia was much better controlled on low-dose metoprolol.  She has been seeing Dr. Melvyn Novas in the pulmonary division. Has seen Wert for her persistent bronchitits.  Coughs , clear sputum,  No yellow - green sputum .  No fever   Current Outpatient Medications on File Prior to Visit  Medication Sig Dispense Refill  . famotidine (PEPCID) 20 MG tablet One at bedtime    . HYDROcodone-acetaminophen (NORCO/VICODIN) 5-325 MG tablet Take 1 tablet by mouth every 4 (four) hours as needed for moderate pain. 40 tablet 0  . Multiple Vitamin (MULTI-DAY VITAMINS) TABS Take 1 tablet by mouth daily.     No current facility-administered medications on file prior to visit.     Allergies  Allergen Reactions  . Ciprofloxacin Other (See Comments)    triggered a migraine that was thought to be a TIA  . Penicillins Rash    Has patient had a PCN reaction causing immediate rash, facial/tongue/throat swelling, SOB or lightheadedness with hypotension: Yes Has patient had a PCN reaction causing severe rash involving mucus membranes or skin necrosis: No Has patient had a PCN reaction that required hospitalization: No Has patient had a PCN reaction occurring within the last 10 years: No If all of the above answers are "NO", then may proceed with Cephalosporin use.  . Sulfonamide  Derivatives Swelling  . Avelox [Moxifloxacin] Other (See Comments)    Rash and severe tingling/numbness   . Reclast [Zoledronic Acid] Other (See Comments)    hypertension  . Chlorpheniramine Other (See Comments)    Headache, stomach ache, bladder "not working well"    Past Medical History:  Diagnosis Date  . Arm numbness left   . Back pain   . Bronchitis, chronic ([place])   . Depression   . Dizziness   . Fatigue   . Hypertension    mild  . Palpitations   . Skin cancer   . Stress   . Syncope    History of     Past Surgical History:  Procedure Laterality Date  . APPENDECTOMY    . CESAREAN SECTION   [phone]    Social History   Tobacco Use  Smoking Status Never Smoker  Smokeless Tobacco Never Used    Social History   Substance and Sexual Activity  Alcohol Use Yes   Comment: social ETOH    Family History  Problem Relation Age of Onset  . Atrial fibrillation Mother   . Thyroid disease Mother   . Alcohol abuse Father     Reviw of Systems:  Reviewed in the HPI.  All other systems are negative.   Physical Exam: Blood pressure 125/85, pulse 86, height 5\' 4"  (1.626 m), weight 119 lb 12.8 oz (54.3 kg).  GEN:  Well nourished, well developed in no acute distress HEENT: Normal NECK: No JVD; No carotid bruits LYMPHATICS: No lymphadenopathy CARDIAC: RRR , no murmurs, rubs, gallops RESPIRATORY:  Clear to auscultation without rales, wheezing or rhonchi  ABDOMEN: Soft, non-tender, non-distended MUSCULOSKELETAL:  No edema; No deformity  SKIN: Warm and dry NEUROLOGIC:  Alert and oriented x 3   ECG:   Oct. 6, 2020 :   NSR at 86.   PACs   Assessment / Plan:   1.  Paroxysmal Atrial fibrillation-  He is having some increased palpitations which may be due to PAF but we have not measured any evidence of atrial fibrillation in quite some time.  By the time we got the EKG her rhythm was sinus. She is not on any anticoagulation because her episode of atrial fibrillation was only very brief and was associated with a stressful event/hospitalization.  Her metoprolol has expired.  Will start metoprolol 12.5 mg twice a day.  2. History of syncope -    Mertie Moores, MD  03/11/2019 8:34 PM    [address],  [place], [place]  [address] Pager [phone] Phone: [phone]; Fax: [phone]

## 2019-03-11 NOTE — Patient Instructions (Signed)
Medication Instructions:  Your physician has recommended you make the following change in your medication: RESTART Metoprolol tartrate (Lopressor) 12.5 mg twice daily  If you need a refill on your cardiac medications before your next appointment, please call your pharmacy.    Lab work: None Ordered    Testing/Procedures: None Ordered   Follow-Up: At [facility], you and your health needs are our priority.  As part of our continuing mission to provide you with exceptional heart care, we have created designated Provider Care Teams.  These Care Teams include your primary Cardiologist (physician) and Advanced Practice Providers (APPs -  Physician Assistants and Nurse Practitioners) who all work together to provide you with the care you need, when you need it. You will need a follow up appointment in:  6 months.  Please call our office 2 months in advance to schedule this appointment.  You may see Mertie Moores, MD or one of the following Advanced Practice Providers on your designated Care Team: Richardson Dopp, PA-C [place], [place] . Daune Perch, NP

## 2019-04-05 DIAGNOSIS — R238 Other skin changes: Secondary | ICD-10-CM | POA: Diagnosis not present

## 2019-04-05 DIAGNOSIS — Z7189 Other specified counseling: Secondary | ICD-10-CM | POA: Diagnosis not present

## 2019-04-06 DIAGNOSIS — R238 Other skin changes: Secondary | ICD-10-CM | POA: Diagnosis not present

## 2019-05-15 DIAGNOSIS — E042 Nontoxic multinodular goiter: Secondary | ICD-10-CM | POA: Diagnosis not present

## 2019-05-15 DIAGNOSIS — Z0000 Encounter for general adult medical examination without abnormal findings: Secondary | ICD-10-CM | POA: Diagnosis not present

## 2019-05-15 DIAGNOSIS — Z131 Encounter for screening for diabetes mellitus: Secondary | ICD-10-CM | POA: Diagnosis not present

## 2019-05-15 DIAGNOSIS — Z13220 Encounter for screening for lipoid disorders: Secondary | ICD-10-CM | POA: Diagnosis not present

## 2019-08-21 ENCOUNTER — Ambulatory Visit: Payer: Medicare Other

## 2019-08-22 ENCOUNTER — Ambulatory Visit: Payer: Medicare Other | Attending: Internal Medicine

## 2019-08-22 DIAGNOSIS — Z23 Encounter for immunization: Secondary | ICD-10-CM

## 2019-08-22 NOTE — Progress Notes (Signed)
   Covid-19 Vaccination Clinic  Name:  Verdella Stoen    MRN: [id number] DOB: [birth year]  08/22/2019  Ms. Logemann was observed post Covid-19 immunization for 30 minutes based on pre-vaccination screening without incident. She was provided with Vaccine Information Sheet and instruction to access the V-Safe system.   Ms. Schlichte was instructed to call 911 with any severe reactions post vaccine: Marland Kitchen Difficulty breathing  . Swelling of face and throat  . A fast heartbeat  . A bad rash all over body  . Dizziness and weakness   Immunizations Administered    Name Date Dose VIS Date Route   Pfizer COVID-19 Vaccine 08/22/2019  4:50 PM 0.3 mL 05/16/2019 Intramuscular   Manufacturer: [place]   Lot: [id number]   [place]: [id number]

## 2019-09-04 DIAGNOSIS — Z961 Presence of intraocular lens: Secondary | ICD-10-CM | POA: Diagnosis not present

## 2019-09-04 DIAGNOSIS — H04123 Dry eye syndrome of bilateral lacrimal glands: Secondary | ICD-10-CM | POA: Diagnosis not present

## 2019-09-04 DIAGNOSIS — H26493 Other secondary cataract, bilateral: Secondary | ICD-10-CM | POA: Diagnosis not present

## 2019-09-15 ENCOUNTER — Other Ambulatory Visit: Payer: Self-pay

## 2019-09-15 ENCOUNTER — Encounter: Payer: Self-pay | Admitting: *Deleted

## 2019-09-15 ENCOUNTER — Telehealth: Payer: Self-pay | Admitting: *Deleted

## 2019-09-15 ENCOUNTER — Encounter: Payer: Self-pay | Admitting: Cardiovascular Disease

## 2019-09-15 ENCOUNTER — Ambulatory Visit (INDEPENDENT_AMBULATORY_CARE_PROVIDER_SITE_OTHER): Payer: Medicare Other | Admitting: Cardiovascular Disease

## 2019-09-15 VITALS — BP 150/76 | HR 92 | Ht 64.0 in | Wt 120.8 lb

## 2019-09-15 DIAGNOSIS — J42 Unspecified chronic bronchitis: Secondary | ICD-10-CM | POA: Diagnosis not present

## 2019-09-15 DIAGNOSIS — R002 Palpitations: Secondary | ICD-10-CM

## 2019-09-15 DIAGNOSIS — I10 Essential (primary) hypertension: Secondary | ICD-10-CM | POA: Diagnosis not present

## 2019-09-15 DIAGNOSIS — I480 Paroxysmal atrial fibrillation: Secondary | ICD-10-CM | POA: Diagnosis not present

## 2019-09-15 DIAGNOSIS — R55 Syncope and collapse: Secondary | ICD-10-CM | POA: Diagnosis not present

## 2019-09-15 NOTE — Telephone Encounter (Signed)
Patient enrolled for [employer] to ship a 30 day cardiac event monitor to her home.  Instructions mailed to patient.

## 2019-09-15 NOTE — Patient Instructions (Addendum)
Medication Instructions:  Your physician recommends that you continue on your current medications as directed. Please refer to the Current Medication list given to you today.  *If you need a refill on your cardiac medications before your next appointment, please call your pharmacy*   Lab Work: TODAY - TSH, BMET, CBC If you have labs (blood work) drawn today and your tests are completely normal, you will receive your results only by: Marland Kitchen MyChart Message (if you have MyChart) OR . A paper copy in the mail If you have any lab test that is abnormal or we need to change your treatment, we will call you to review the results.   Testing/Procedures: Your physician has recommended that you wear an event monitor. Event monitors are medical devices that record the heart's electrical activity. Doctors most often [place] these monitors to diagnose arrhythmias. Arrhythmias are problems with the speed or rhythm of the heartbeat. The monitor is a small, portable device. You can wear one while you do your normal daily activities. This is usually used to diagnose what is causing palpitations/syncope (passing out).   Follow-Up: At [facility], you and your health needs are our priority.  As part of our continuing mission to provide you with exceptional heart care, we have created designated Provider Care Teams.  These Care Teams include your primary Cardiologist (physician) and Advanced Practice Providers (APPs -  Physician Assistants and Nurse Practitioners) who all work together to provide you with the care you need, when you need it.  We recommend signing up for the patient portal called "MyChart".  Sign up information is provided on this After Visit Summary.  MyChart is used to connect with patients for Virtual Visits (Telemedicine).  Patients are able to view lab/test results, encounter notes, upcoming appointments, etc.  Non-urgent messages can be sent to your provider as well.   To learn more about what you  can do with MyChart, go to [email or web address].    Your next appointment:   6 month(s)  The format for your next appointment:   In Person  Provider:   You may see Mertie Moores, MD or one of the following Advanced Practice Providers on your designated Care Team:    Richardson Dopp, PA-C  [place], [place]  Daune Perch, [place]

## 2019-09-15 NOTE — Progress Notes (Signed)
[name]  Date of Birth  January 14, 1933 [facility] / [facility] [id number] [place], [place]  [address] [phone]  Fax  [phone]   P roblem list: 1. Palpitations 2. History of syncope 3. Dizziness 4. Mild hypertension  [name] is an elderly female with a history of palpitations and history of dizziness. She has mild hypertension. She's done very well since I last saw her. She has not had any episodes of chest pain or shortness of breath. She has had some leg discomfort particularly on busy days.  Sept. 27, 2016: [name] is seen back for office visit. Has had some lightheadedness on occasion .  Lasts for a few seconds  Occurs at various times, not associated with any particular activity .  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   Feb. 1, 2017 Has been under lots of stress,  Husband had neck surgery and it was complicated by some paralysis  Has been running back and forth to [facility] (Dr. Carloyn Manner did the surgery)    Sept. 18, 2017:  [name] is seen back for follow up visit. Has palpitations. She will wore a monitor but was only able to tolerate for 2 days. She did not have any significant arrhythmias during this 2 days of monitoring.  Bp is elevated here BP readings at home are normal at home  Palpitations seem to be well controlled.   Nov. 12, 2018:    [name] is seen today  Was seen 2 weeks ago in the ER with rapid Afib.  Her husband passed away 1 week ago .  BP is a bit high  No additional episodes of atrial fibrillation  February 12, 2018: Seen today for follow-up visit.  She has a history of paroxysmal atrial fibrillation. Has chronic bronchiactasis - sees Dr. Melvyn Novas.  No further episodes of Afib    March 11, 2019:  [name] is seen today for follow-up of her paroxysmal atrial fibrillation and hypertension.  She also has a history of bronchiectasis.  She was seen in March, 2020 by  telemedicine visit  .  Her tachycardia was much better controlled on low-dose metoprolol.  She has been seeing Dr. Melvyn Novas in the pulmonary division. Has seen Wert for her persistent bronchitits.  Coughs , clear sputum,  No yellow - green sputum .  No fever   September 15, 2019:  The seen today for follow-up visit.  She has a history of paroxysmal atrial fibrillation and hypertension. Has sold her house .  Is now in a town home . She started having some palpitations last week.  Took 1/2 metoprolol tablet Woke her up. Tried to go back to sleep .  Apparently passed out on the floor .  Did not go to any doctor - is telling me about this today  Has a hx of PAF in the distant past  No further symptoms    Current Outpatient Medications on File Prior to Visit  Medication Sig Dispense Refill  . famotidine (PEPCID) 20 MG tablet One at bedtime    . HYDROcodone-acetaminophen (NORCO/VICODIN) 5-325 MG tablet Take 1 tablet by mouth every 4 (four) hours as needed for moderate pain. 40 tablet 0  . metoprolol tartrate (LOPRESSOR) 25 MG tablet Take 12.5 mg by mouth as needed.    . Multiple Vitamin (MULTI-DAY VITAMINS) TABS Take 1 tablet by mouth daily.     No current facility-administered medications  on file prior to visit.    Allergies  Allergen Reactions  . Ciprofloxacin Other (See Comments)    triggered a migraine that was thought to be a TIA  . Penicillins Rash    Has patient had a PCN reaction causing immediate rash, facial/tongue/throat swelling, SOB or lightheadedness with hypotension: Yes Has patient had a PCN reaction causing severe rash involving mucus membranes or skin necrosis: No Has patient had a PCN reaction that required hospitalization: No Has patient had a PCN reaction occurring within the last 10 years: No If all of the above answers are "NO", then may proceed with Cephalosporin use.  . Sulfonamide Derivatives Swelling  . Avelox [Moxifloxacin] Other (See Comments)    Rash and severe  tingling/numbness   . Reclast [Zoledronic Acid] Other (See Comments)    hypertension  . Chlorpheniramine Other (See Comments)    Headache, stomach ache, bladder "not working well"    Past Medical History:  Diagnosis Date  . Arm numbness left   . Back pain   . Bronchitis, chronic ([place])   . Depression   . Dizziness   . Fatigue   . Hypertension    mild  . Palpitations   . Skin cancer   . Stress   . Syncope    History of     Past Surgical History:  Procedure Laterality Date  . APPENDECTOMY    . CESAREAN SECTION  [phone]    Social History   Tobacco Use  Smoking Status Never Smoker  Smokeless Tobacco Never Used    Social History   Substance and Sexual Activity  Alcohol Use Yes   Comment: social ETOH    Family History  Problem Relation Age of Onset  . Atrial fibrillation Mother   . Thyroid disease Mother   . Alcohol abuse Father     Reviw of Systems:  Reviewed in the HPI.  All other systems are negative.   Physical Exam: Blood pressure (!) 150/76, pulse 92, height 5\' 4"  (1.626 m), weight 120 lb 12.8 oz (54.8 kg), SpO2 95 %.  GEN:   Elderly female,  NAD  HEENT: Normal NECK: No JVD; No carotid bruits LYMPHATICS: No lymphadenopathy CARDIAC: RRR , no murmurs, rubs, gallops RESPIRATORY:  Clear to auscultation without rales, wheezing or rhonchi  ABDOMEN: Soft, non-tender, non-distended MUSCULOSKELETAL:  No edema; No deformity  SKIN: Warm and dry NEUROLOGIC:  Alert and oriented x 3    ECG:    Assessment / Plan:   1.  Paroxysmal Atrial fibrillation-she had some palpitations last week and wound up lying on the floor.  She thinks she passed out. We will get a 30-day event monitor.  We will check a TSH, CBC, basic metabolic profile. Cont current meds.    2. History of syncope -  She had another episode of syncope.  She woke up on the ground and was not sure how she got there.  Stressed to her the importance of going to the hospital to be  evaluated after an episode of syncope.  At this point is difficult to say exactly why she passed out.  We will place an event monitor on her for further evaluation.  Check CBC, basic metabolic profile, TSH.  I will see her again in 6 months for follow-up visit.   Mertie Moores, MD  09/15/2019 3:55 PM    [address],  [place], [place]  [address] Pager [phone] Phone: [phone]; Fax: [phone]     

## 2019-09-16 LAB — BASIC METABOLIC PANEL
BUN/Creatinine Ratio: 17 (ref 12–28)
BUN: 18 mg/dL (ref 8–27)
CO2: 24 mmol/L (ref 20–29)
Calcium: 9.3 mg/dL (ref 8.7–10.3)
Chloride: 104 mmol/L (ref 96–106)
Creatinine, Ser: 1.09 mg/dL — ABNORMAL HIGH (ref 0.57–1.00)
GFR calc Af Amer: 53 mL/min/{1.73_m2} — ABNORMAL LOW (ref >59)
GFR calc non Af Amer: 46 mL/min/{1.73_m2} — ABNORMAL LOW (ref >59)
Glucose: 108 mg/dL — ABNORMAL HIGH (ref 65–99)
Potassium: 4.5 mmol/L (ref 3.5–5.2)
Sodium: 141 mmol/L (ref 134–144)

## 2019-09-16 LAB — CBC
Hematocrit: 41.9 % (ref 34.0–46.6)
Hemoglobin: 14.7 g/dL (ref 11.1–15.9)
MCH: 32.1 pg (ref 26.6–33.0)
MCHC: 35.1 g/dL (ref 31.5–35.7)
MCV: 92 fL (ref 79–97)
Platelets: 252 10*3/uL (ref 150–450)
RBC: 4.58 x10E6/uL (ref 3.77–5.28)
RDW: 12.9 % (ref 11.7–15.4)
WBC: 11.2 10*3/uL — ABNORMAL HIGH (ref 3.4–10.8)

## 2019-09-16 LAB — TSH: TSH: 1.95 u[IU]/mL (ref 0.450–4.500)

## 2019-09-17 ENCOUNTER — Ambulatory Visit: Payer: Medicare Other | Attending: Internal Medicine

## 2019-09-17 DIAGNOSIS — Z23 Encounter for immunization: Secondary | ICD-10-CM

## 2019-09-17 NOTE — Progress Notes (Signed)
   Covid-19 Vaccination Clinic  Name:  [name]    MRN: [id number] DOB: [birth year]  09/17/2019  [name] was observed post Covid-19 immunization for 15 minutes without incident. She was provided with Vaccine Information Sheet and instruction to access the V-Safe system.   [name] was instructed to call 911 with any severe reactions post vaccine: Marland Kitchen Difficulty breathing  . Swelling of face and throat  . A fast heartbeat  . A bad rash all over body  . Dizziness and weakness   Immunizations Administered    Name Date Dose VIS Date Route   Pfizer COVID-19 Vaccine 09/17/2019  1:01 PM 0.3 mL 05/16/2019 Intramuscular   Manufacturer: [place]   Lot: [id number]   [place]: [id number]

## 2019-09-19 ENCOUNTER — Ambulatory Visit (INDEPENDENT_AMBULATORY_CARE_PROVIDER_SITE_OTHER): Payer: Medicare Other

## 2019-09-19 ENCOUNTER — Ambulatory Visit (INDEPENDENT_AMBULATORY_CARE_PROVIDER_SITE_OTHER): Payer: Medicare Other | Admitting: Internal Medicine

## 2019-09-19 ENCOUNTER — Encounter: Payer: Self-pay | Admitting: Internal Medicine

## 2019-09-19 ENCOUNTER — Other Ambulatory Visit: Payer: Self-pay

## 2019-09-19 DIAGNOSIS — J479 Bronchiectasis, uncomplicated: Secondary | ICD-10-CM

## 2019-09-19 DIAGNOSIS — R05 Cough: Secondary | ICD-10-CM

## 2019-09-19 DIAGNOSIS — R058 Other specified cough: Secondary | ICD-10-CM

## 2019-09-19 MED ORDER — LEVOFLOXACIN 500 MG PO TABS: 500.0000 mg | ORAL_TABLET | Freq: Every day | ORAL | 5 refills | Status: DC

## 2019-09-19 MED ORDER — FAMOTIDINE 20 MG PO TABS: ORAL_TABLET | ORAL | Status: DC

## 2019-09-19 NOTE — Progress Notes (Addendum)
Subjective:    Patient ID: [name], female    DOB: [birth year]    MRN: [id number]    Primary Provider/Referring Provider: Dr Doyle Askew   Brief patient profile:  8 yowf never smoker/ MM   with recurrent cough since age 86 with right middle lobe/lingular syndrome with limited bronchiectatic changes on  CT study dated 09/2005    History of Present Illness    03/01/2016  f/u ov/[name] re:  Bronchiectasis with uacs/ some better on gerd rx  Chief Complaint  Patient presents with  . Follow-up    Pt states her dry cough is unchanged since last OV. Pt states she has ocassional chest tightness and SOB but attributes this to stress. Pt also c/o hoarseness.    coughs up to an hour each am, completely non-productive even with flutter  rec No change in medications Nasty mucus > doxycline 100 mg twice daily x 10 days   Late add for nodular opacity RUL c/w MAI    zmax x 250 mg daily x 30 days then recheck cxr       03/29/2016  f/u ov/[name] re: obst bronchiectasis / uacs maint rx zmax  Chief Complaint  Patient presents with  . Follow-up    CXR was done today. She is coughing less but still having some hoarseness.  She gets SOB walking up stairs.    cough is now dry/  throat is worse off all gerd rx  Doe = MMRC1 = can walk nl pace, flat grade, can't hurry or go uphills or steps s sob   rec Please see patient coordinator before you leave today  to schedule HRCT chest  Continue zithromax daily for now  Pantoprazole (protonix) 40 mg   Take  30-60 min before first meal of the day and tagament 400  one @  Bedtime GERD diet     07/07/2016  f/u ov/[name] re: obst bronchiectasis/ maint rx zmax Chief Complaint  Patient presents with  . Follow-up    Pt states cough is improving but she does c/o hoarseness.   rec Tagamet should be after bfast and supper  GERD diet Leave off maint zmax > no change in pattern      05/20/2018 cough since 04/2018  Sleeps on 2 pillows but takes hydrocodone  Cough   Mucus mostly white / using flutter valve mostly  in am  rec Prilosec 20mg  should be Take 30- 60 min before your first and last meals of the day as long as you are coughing or need  any cough medication  GERD diet  Depomedrol 120 mg IM  For cough mucinex or mucinex dm up 1200 mg every 12 hours and flutter valve and supplement with the hydrocodone      06/11/2018 acute  ov/[name] re: bronchiectasis/ cough really never better since early Nov 2019 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 Acute Visit    severe cough x 2 months with white/yellow/green congestion. Has been taking medication but not helping.  Dyspnea:  Fine unless coughing  Cough: better at hs then starts up in  am after stirs and all day and lasts all day with variably purulent but never bloody mus   Sleeping: ok bed flat/ 2 pillows  rec Prednisone 10 mg take  4 each am x 2 days,   2 each am x 2 days,  1 each am x 2 days and stop  levaquin 500 mg one daily x 10 days -  stops if aches in tendons  Stop prilosec and start protonix 40 mg Take 30-60 min before first meal of the day and pepcid 20mg  after supper  For cough mucinex or mucinex dm up 1200 mg every 12 hours and flutter valve and supplement with the hydrocodone      06/24/2018  f/u ov/[name] re: bronchiectasis with cough since early Nov 2019 not typical of her bronchiectasis Chief Complaint  Patient presents with  . Follow-up    cough with white mucus and ocasion green piece, ribs hurt from coughing, feel like eyes swollen  Dyspnea:  No change  = MMRC1 = can walk nl pace, flat grade, can't hurry or go uphills or steps s sob   Cough: dry > wet and day > night and?  nauseated form mucinex dm/ using flutter valve but only hs vicodin Sleeping: sleeping flat on L side s noct symptoms rec Take delsym two tsp every 12 hours and supplement if needed with vicodin mg up to 1-2 every 4 hours to suppress the urge to cough.   Once you have eliminated the cough  for 3 straight days try reducing the vicodin  first,  then the delsym as tolerated.   Use the flutter valve as much as possible and stop mucinex   Change the pepcid to where you take it after supper     07/22/2018  f/u ov/[name] re: bronchiectasis / cyclical cough since nov 2019 resolved  Chief Complaint  Patient presents with  . Follow-up    Cough has improved.   Dyspnea:  Not limited by breathing from desired activities   Cough: no mucus production/ no longer using delsym or vicodin  Sleeping: bed is flat, 2 pillows, prefers L side  SABA use: none 02: none  rec Mucinex dm would be a better choice to take as needed instead of the mucinex or delsym Vicodin is for emergency purposes when you can't step coughing  Keep the cat out of your bedroom if at all possible    Virtual Visit via Telephone Note 10/21/2018 re obst bronchiectasis/ cyclical cough   History of Present Illness:  Dyspnea:   sob - walks several hundred feet across parking lot daily  to office/ also walking once daily on street 10-17min and limited by strength/ energy  Cough: better now, cough occurs p waking up, no excess production Sleeping: flat bed with pillows  SABA use: none  02: no  Eating well but lots of lean foods and "don't know why not able to gain wt"  rec No change in medications  You need to eat more calorie to maintain your weight by eating more fatty meats/ starches     09/19/2019  f/u ov/[name] re: obst bronchiectasis  2nd pfizer 09/17/19 > tol ok  Chief Complaint  Patient presents with  . Follow-up    She states coughing up chunks of black sputum last wk. She states that if she lies down flat she coughs up green to yellow sputum.    Dyspnea:  Less active/ no power walks, works from home Cough: immediate when supine green, most am's cough x 40 min >  Same x months / using flutter and mucinex sparingly  Sleeping: bed is flat, 2 pillows  SABA use: none  02: none  Main concern is daytime tickle in  throat, has seen Wolicki  in past    No obvious day to day or daytime variability or assoc mucus plugs or hemoptysis or cp or chest tightness, subjective  wheeze or overt sinus or hb symptoms.   Sleeping as above without nocturnal  exacerbation  of respiratory  c/o's or need for noct saba. Also denies any obvious fluctuation of symptoms with weather or environmental changes or other aggravating or alleviating factors except as outlined above   No unusual exposure hx or h/o childhood pna/ asthma or knowledge of premature birth.  Current Allergies, Complete Past Medical History, Past Surgical History, Family History, and Social History were reviewed in [employer] record.  ROS  The following are not active complaints unless bolded Hoarseness, sore throat, dysphagia, dental problems, itching, sneezing,  nasal congestion or discharge of excess mucus or purulent secretions, ear ache,   fever, chills, sweats, unintended wt loss or wt gain, classically pleuritic or exertional cp,  orthopnea pnd or arm/hand swelling  or leg swelling, presyncope, palpitations, abdominal pain, anorexia, nausea, vomiting, diarrhea  or change in bowel habits or change in bladder habits, change in stools or change in urine, dysuria, hematuria,  rash, arthralgias, visual complaints, headache, numbness, weakness or ataxia or problems with walking or coordination,  change in mood or  memory.        Current Meds  Medication Sig  . famotidine (PEPCID) 20 MG tablet One at bedtime  . HYDROcodone-acetaminophen (NORCO/VICODIN) 5-325 MG tablet Take 1 tablet by mouth every 4 (four) hours as needed for moderate pain.  . metoprolol tartrate (LOPRESSOR) 25 MG tablet Take 12.5 mg by mouth daily as needed.                        Past Medical History:  BRONCHIECTASIS (ICD-494.0) see CT scan of the chest dated 09/29/2005  - Pneumovax April 28, 2009 (over 65 so last one)  Prevnar given 04/27/2014   -  Alpha one Screen  August 18, 2010 = MM  - Sinus CT August 18, 2010 >>  neg  COUGH, CHRONIC (ICD-786.2)  Left Kidney Cyst  - 04/21/09 [facility] PROCEDURE(S): ULTRASOUND GUIDED AND FLUOROSCOPIC GUIDED LEFT RENAL CYST ASPIRATION AND SCLEROSIS  LUQ Abd pain 2011...............................................................Marland KitchenSchooler  - CT ABD 07/29/10 no etiology        Objective:   Physical Exam    Wt  09/19/2019 120  07/22/2018     122  wt 137 > 137 April 28, 2009 > August 18, 2010 137 >  137 09/29/2010 > 06/21/12 wt 134> 140 03/20/2013 > 04/27/2014  139> 08/10/2014  138 > 02/12/2015 136 >05/27/2015  139 > 11/24/2015 133 > 03/01/2016  130 > 05/10/2016 134 > 07/07/2016  134 > 08/28/2017  132> 11/30/2017    131 > 05/20/2018  129 > 06/11/2018  123 > 06/24/2018  125 > wt      amb healthy appearing  but thin wf nad/ freq throat clearing   Vital signs reviewed  09/19/2019  - Note at rest 02 sats  96% on RA    HEENT : pt wearing mask not removed for exam due to covid -19 concerns.    NECK :  without JVD/Nodes/TM/ nl carotid upstrokes bilaterally   LUNGS: no acc muscle use,  Nl contour chest which is clear to A and P bilaterally without cough on insp or exp maneuvers   CV:  RRR  no s3 or murmur or increase in P2, and no edema   ABD:  soft and nontender with nl inspiratory excursion in the supine position. No bruits or organomegaly appreciated, bowel sounds nl  MS:  Nl gait/ ext  warm without deformities, calf tenderness, cyanosis or clubbing No obvious joint restrictions   SKIN: warm and dry without lesions    NEURO:  alert, approp, nl sensorium with  no motor or cerebellar deficits apparent.         CXR PA and Lateral:   09/19/2019 :    I personally reviewed images and  impression as follows:    no acute change          Assessment & Plan:

## 2019-09-19 NOTE — Assessment & Plan Note (Signed)
Trial of max gerd rx 11/24/2015 >> worse off rx 03/29/2016 and 08/28/2017 > resume ppi qam ac> resolved as of 11/30/2017   - Flared again 04/2018 > rechallenged 06/11/2018 and did not follow instructions> resolved 07/22/2018   >>>  worse again 09/19/2019 rec increase pepcid to 20 mg bid and diet/bed blocks, f/u ent prn ([facility])          Each maintenance medication was reviewed in detail including emphasizing most importantly the difference between maintenance and prns and under what circumstances the prns are to be triggered using an action plan format where appropriate.  Total time for H and P, chart review, counseling, teaching device (flutter reviewed) and generating customized AVS unique to this office visit / charting = 20 min

## 2019-09-19 NOTE — Assessment & Plan Note (Addendum)
Onset ? Age 86 wit bronchiectasis first confirmed 2007  - Alpha one Screen  August 18, 2010 = MM  - See CT Chest 09/29/05 Stable bronchiectasis in right middle lobe and lingula.  Scattered tree in bud opacities throughout the lungs, right greater than left,  nonspecific.   - PFT's 06/21/2012 1.67 (91%) ratio 64 and no change,  DLCO 77% - Flutter valve added 04/27/2014  - PFTs 05/24/15   FEV1 1.55 (81%) ratio 69 p 6% improvement from saba and dlco 62% ratio 88%  - HRCT 04/04/16 >>> 1. 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 2. An incidental finding of potential clinical significance has been found. Dominant nodule in the right upper lobe, likely infectious/inflammatory in etiology.  - Zmax 250 mg daily 03/01/16 - 05/01/16 improved cough at d/c but diarrhea while on it and no change in nodule RUL > d/c 07/07/16 - 08/28/2017 added back cycles of zpak prn flare of purulent bronchitis > d/c 05/20/2018  - levaquin 500 x 10 days prn 06/11/2018 - flutter valve training 06/24/18 Chest CT 09/09/2018 Increased tree-in-bud nodularity in right lower lobe since 2016  This is an extremely common benign condition in the elderly and does not warrant aggressive eval/ rx at this point unless there is a clinical correlation suggesting unaddressed pulmonary infection (purulent sputum, night sweats, unintended wt loss, doe) or evolution of  obvious changes on plain cxr (as opposed to serial CT, which is way over sensitive to make clinical decisions re intervention and treatment in the elderly, who tend to tolerate both dx and treatment poorly) .   >>> cxr : ok , so no convincing progression clinically or radiographically and f/u q 6 m , sooner prn, same rx

## 2019-09-19 NOTE — Patient Instructions (Addendum)
Please remember to go to the  x-ray department  for your tests - we will call you with the results when they are available to decide on bronchoscopy  GERD (REFLUX)  is an extremely common cause of respiratory symptoms just like yours , many times with no obvious heartburn at all.    It can be treated with medication, but also with lifestyle changes including elevation of the head of your bed (ideally with 6 -8inch blocks under the headboard of your bed),  Smoking cessation, avoidance of late meals, excessive alcohol, and avoid fatty foods, chocolate, peppermint, colas, red wine, and acidic juices such as orange juice.  NO MINT OR MENTHOL PRODUCTS SO NO COUGH DROPS  USE SUGARLESS CANDY INSTEAD (Jolley ranchers or Stover's or Life Savers) or even ice chips will also do - the key is to swallow to prevent all throat clearing. NO OIL BASED VITAMINS - use powdered substitutes.  Avoid fish oil when coughing.   For cough > mucinex dm 1200 mg every 12 hours and use the flutter valve as much as you can   Mucus gets nastier, thicker or bloody or fever > levaquin 500 mg daily x 10 days   Please schedule a follow up visit in 6  months but call sooner if needed

## 2019-09-20 ENCOUNTER — Encounter: Payer: Self-pay | Admitting: Internal Medicine

## 2019-09-20 NOTE — Addendum Note (Signed)
Addended by: Christinia Gully B on: 09/20/2019 10:57 AM   Modules accepted: Level of Service

## 2019-09-22 ENCOUNTER — Ambulatory Visit (INDEPENDENT_AMBULATORY_CARE_PROVIDER_SITE_OTHER): Payer: Medicare Other

## 2019-09-22 DIAGNOSIS — I480 Paroxysmal atrial fibrillation: Secondary | ICD-10-CM | POA: Diagnosis not present

## 2019-09-22 DIAGNOSIS — R55 Syncope and collapse: Secondary | ICD-10-CM

## 2019-09-22 DIAGNOSIS — R002 Palpitations: Secondary | ICD-10-CM

## 2019-09-22 NOTE — Progress Notes (Signed)
Spoke with pt and notified of results per Dr. Wert. Pt verbalized understanding and denied any questions. 

## 2019-10-30 DIAGNOSIS — Z1231 Encounter for screening mammogram for malignant neoplasm of breast: Secondary | ICD-10-CM | POA: Diagnosis not present

## 2019-12-12 ENCOUNTER — Other Ambulatory Visit: Payer: Self-pay | Admitting: Nurse Practitioner

## 2019-12-12 ENCOUNTER — Telehealth: Payer: Self-pay | Admitting: Cardiovascular Disease

## 2019-12-12 ENCOUNTER — Emergency Department (HOSPITAL_COMMUNITY): Payer: Medicare Other

## 2019-12-12 ENCOUNTER — Emergency Department (HOSPITAL_COMMUNITY)
Admission: EM | Admit: 2019-12-12 | Discharge: 2019-12-12 | Disposition: A | Discharge status: Home / Self Care | Payer: Medicare Other | Attending: Emergency Medicine | Admitting: Emergency Medicine

## 2019-12-12 DIAGNOSIS — W1839XA Other fall on same level, initial encounter: Secondary | ICD-10-CM | POA: Diagnosis not present

## 2019-12-12 DIAGNOSIS — R002 Palpitations: Secondary | ICD-10-CM | POA: Diagnosis not present

## 2019-12-12 DIAGNOSIS — R0602 Shortness of breath: Secondary | ICD-10-CM | POA: Diagnosis not present

## 2019-12-12 DIAGNOSIS — S199XXA Unspecified injury of neck, initial encounter: Secondary | ICD-10-CM | POA: Diagnosis not present

## 2019-12-12 DIAGNOSIS — I10 Essential (primary) hypertension: Secondary | ICD-10-CM | POA: Insufficient documentation

## 2019-12-12 DIAGNOSIS — S92352A Displaced fracture of fifth metatarsal bone, left foot, initial encounter for closed fracture: Secondary | ICD-10-CM | POA: Diagnosis not present

## 2019-12-12 DIAGNOSIS — R0781 Pleurodynia: Secondary | ICD-10-CM | POA: Diagnosis not present

## 2019-12-12 DIAGNOSIS — S20211A Contusion of right front wall of thorax, initial encounter: Secondary | ICD-10-CM | POA: Diagnosis not present

## 2019-12-12 DIAGNOSIS — I480 Paroxysmal atrial fibrillation: Secondary | ICD-10-CM | POA: Insufficient documentation

## 2019-12-12 DIAGNOSIS — R0600 Dyspnea, unspecified: Secondary | ICD-10-CM | POA: Diagnosis not present

## 2019-12-12 DIAGNOSIS — Y92002 Bathroom of unspecified non-institutional (private) residence single-family (private) house as the place of occurrence of the external cause: Secondary | ICD-10-CM | POA: Diagnosis not present

## 2019-12-12 DIAGNOSIS — Y999 Unspecified external cause status: Secondary | ICD-10-CM | POA: Insufficient documentation

## 2019-12-12 DIAGNOSIS — Y9389 Activity, other specified: Secondary | ICD-10-CM | POA: Insufficient documentation

## 2019-12-12 DIAGNOSIS — S0990XA Unspecified injury of head, initial encounter: Secondary | ICD-10-CM | POA: Diagnosis not present

## 2019-12-12 DIAGNOSIS — M7989 Other specified soft tissue disorders: Secondary | ICD-10-CM | POA: Diagnosis not present

## 2019-12-12 DIAGNOSIS — R911 Solitary pulmonary nodule: Secondary | ICD-10-CM | POA: Diagnosis not present

## 2019-12-12 DIAGNOSIS — J479 Bronchiectasis, uncomplicated: Secondary | ICD-10-CM | POA: Diagnosis not present

## 2019-12-12 DIAGNOSIS — Z79899 Other long term (current) drug therapy: Secondary | ICD-10-CM | POA: Diagnosis not present

## 2019-12-12 DIAGNOSIS — R55 Syncope and collapse: Secondary | ICD-10-CM

## 2019-12-12 DIAGNOSIS — R918 Other nonspecific abnormal finding of lung field: Secondary | ICD-10-CM | POA: Diagnosis not present

## 2019-12-12 DIAGNOSIS — R52 Pain, unspecified: Secondary | ICD-10-CM | POA: Diagnosis not present

## 2019-12-12 DIAGNOSIS — S99922A Unspecified injury of left foot, initial encounter: Secondary | ICD-10-CM | POA: Diagnosis present

## 2019-12-12 DIAGNOSIS — S298XXA Other specified injuries of thorax, initial encounter: Secondary | ICD-10-CM

## 2019-12-12 DIAGNOSIS — M79641 Pain in right hand: Secondary | ICD-10-CM | POA: Diagnosis not present

## 2019-12-12 LAB — BASIC METABOLIC PANEL
Anion gap: 9 (ref 5–15)
BUN: 17 mg/dL (ref 8–23)
CO2: 24 mmol/L (ref 22–32)
Calcium: 9.2 mg/dL (ref 8.9–10.3)
Chloride: 107 mmol/L (ref 98–111)
Creatinine, Ser: 1.11 mg/dL — ABNORMAL HIGH (ref 0.44–1.00)
GFR calc Af Amer: 52 mL/min — ABNORMAL LOW (ref >60)
GFR calc non Af Amer: 45 mL/min — ABNORMAL LOW (ref >60)
Glucose, Bld: 138 mg/dL — ABNORMAL HIGH (ref 70–99)
Potassium: 4.2 mmol/L (ref 3.5–5.1)
Sodium: 140 mmol/L (ref 135–145)

## 2019-12-12 LAB — CBC
HCT: 45.3 % (ref 36.0–46.0)
Hemoglobin: 14.9 g/dL (ref 12.0–15.0)
MCH: 30.7 pg (ref 26.0–34.0)
MCHC: 32.9 g/dL (ref 30.0–36.0)
MCV: 93.4 fL (ref 80.0–100.0)
Platelets: 247 10*3/uL (ref 150–400)
RBC: 4.85 MIL/uL (ref 3.87–5.11)
RDW: 13.1 % (ref 11.5–15.5)
WBC: 12.6 10*3/uL — ABNORMAL HIGH (ref 4.0–10.5)
nRBC: 0 % (ref 0.0–0.2)

## 2019-12-12 LAB — CK: Total CK: 58 U/L (ref 38–234)

## 2019-12-12 LAB — TROPONIN I (HIGH SENSITIVITY)
Troponin I (High Sensitivity): 20 ng/L — ABNORMAL HIGH (ref <18)
Troponin I (High Sensitivity): 16 ng/L (ref <18)

## 2019-12-12 MED ORDER — IBUPROFEN 400 MG PO TABS
400.0000 mg | ORAL_TABLET | Freq: Once | ORAL | Status: AC
  Administered 2019-12-12: 400 mg via ORAL
  Filled 2019-12-12: qty 1

## 2019-12-12 MED ORDER — LIDOCAINE 5 % EX PTCH
1.0000 | MEDICATED_PATCH | CUTANEOUS | Status: DC
  Filled 2019-12-12: qty 1

## 2019-12-12 MED ORDER — ACETAMINOPHEN ER 650 MG PO TBCR
650.0000 mg | EXTENDED_RELEASE_TABLET | Freq: Three times a day (TID) | ORAL | 0 refills | Status: DC | PRN
Start: 2019-12-12 — End: 2020-07-05

## 2019-12-12 MED ORDER — ACETAMINOPHEN 325 MG PO TABS
650.0000 mg | ORAL_TABLET | Freq: Once | ORAL | Status: AC
  Administered 2019-12-12: 650 mg via ORAL
  Filled 2019-12-12: qty 2

## 2019-12-12 MED ORDER — SODIUM CHLORIDE 0.9% FLUSH: 3.0000 mL | Freq: Once | INTRAVENOUS | Status: DC

## 2019-12-12 MED ORDER — SODIUM CHLORIDE 0.9 % IV BOLUS
500.0000 mL | Freq: Once | INTRAVENOUS | Status: AC
  Administered 2019-12-12: 500 mL via INTRAVENOUS

## 2019-12-12 NOTE — ED Provider Notes (Signed)
[facility] EMERGENCY DEPARTMENT Provider Note   CSN: [id number] Arrival date & time: 12/12/19  1258     History Chief Complaint  Patient presents with  . Loss of Consciousness    [name] is a 86 y.o. female with a past medical history significant for chronic bronchitis, paroxysmal A. fib not currently on any anticoagulants, hypertension, and depression who presents to the ED after syncopal episode that occurred around 5 AM this morning.  Patient states she woke up with heart palpitations associated with chest tightness which caused her to go to the bathroom to take her metoprolol. Patient then woke up on the floor. After the syncopal episode, patient admits to generalized weakness and continued chest tightness. Denies current palpitations. She notes she went back to sleep and then reported to the ED for further evaluation.  Denies speech changes, unilateral weakness, and facial droop.  She admits to headache, but denies visual changes, nausea, vomiting. She also admits to left hand and foot pain after the fall. Denies neck and back pain. Admits to right-sided rib pain below her breast associated with shortness of breath.   History obtained from patient and past medical records. No interpreter used during encounter.      Past Medical History:  Diagnosis Date  . Arm numbness left   . Back pain   . Bronchitis, chronic ([place])   . Depression   . Dizziness   . Fatigue   . Hypertension    mild  . Palpitations   . Skin cancer   . Stress   . Syncope    History of     Patient Active Problem List   Diagnosis Date Noted  . Tachycardia 09/02/2018  . Chest pain 08/23/2018  . Acute bronchitis 05/07/2018  . Thyroid nodule 04/06/2016  . Upper airway cough syndrome 11/24/2015  . Near syncope 03/02/2015  . Mastoiditis of both sides 08/10/2014  . Palpitations   . Syncope   . Dizziness   . Hypertension   . Back pain   . Depression   . Stress   . Fatigue   .  Arm numbness left   . Skin cancer   . Bronchitis, chronic ([place])   . Obstructive bronchiectasis ([place]) 08/28/2007    Past Surgical History:  Procedure Laterality Date  . APPENDECTOMY    . CESAREAN SECTION  [phone]     OB History   No obstetric history on file.     Family History  Problem Relation Age of Onset  . Atrial fibrillation Mother   . Thyroid disease Mother   . Alcohol abuse Father     Social History   Tobacco Use  . Smoking status: Never Smoker  . Smokeless tobacco: Never Used  Vaping Use  . Vaping Use: Never used  Substance Use Topics  . Alcohol use: Yes    Comment: social ETOH  . Drug use: No    Home Medications Prior to Admission medications   Medication Sig Start Date End Date Taking? Authorizing Provider  Ascorbic Acid (VITAMIN C) 100 MG tablet Take 100 mg by mouth daily.   Yes [provider]  cholecalciferol (VITAMIN D3) 25 MCG (1000 UNIT) tablet Take 1,000 Units by mouth daily.   Yes [provider]  folic acid (FOLVITE) 1 MG tablet Take 1 mg by mouth daily.   Yes [provider]  HYDROcodone-acetaminophen (NORCO/VICODIN) 5-325 MG tablet Take 1 tablet by mouth every 4 (four) hours as needed for moderate pain. Patient  taking differently: Take 1 tablet by mouth at bedtime.  06/24/18  Yes Tanda Rockers, MD  metoprolol tartrate (LOPRESSOR) 25 MG tablet Take 12.5 mg by mouth daily as needed (heart palpitations).    Yes [provider]  Multiple Vitamins-Minerals (ONE-A-DAY WOMENS 50 PLUS PO) Take 1 tablet by mouth daily.   Yes [provider]  acetaminophen (TYLENOL 8 HOUR) 650 MG CR tablet Take 1 tablet (650 mg total) by mouth every 8 (eight) hours as needed for pain. 12/12/19   Suzy Bouchard, PA-C  famotidine (PEPCID) 20 MG tablet One after bfast and bedtime Patient not taking: Reported on 12/12/2019 09/19/19   Tanda Rockers, MD  levofloxacin (LEVAQUIN) 500 MG tablet Take 1 tablet (500 mg total) by  mouth daily. Patient not taking: Reported on 12/12/2019 09/19/19   Tanda Rockers, MD    Allergies    Ciprofloxacin, Penicillins, Sulfonamide derivatives, Avelox [moxifloxacin], Reclast [zoledronic acid], and Chlorpheniramine  Review of Systems   Review of Systems  Constitutional: Negative for chills and fever.  Respiratory: Positive for cough (chronic), chest tightness and shortness of breath.   Cardiovascular: Positive for chest pain (chest tightness). Negative for leg swelling.  Gastrointestinal: Negative for abdominal pain, diarrhea, nausea and vomiting.  Musculoskeletal: Positive for arthralgias. Negative for back pain and neck pain.  Neurological: Positive for weakness (generalized) and headaches.  All other systems reviewed and are negative.   Physical Exam Updated Vital Signs BP 97/62   Pulse 79   Temp 98.3 F (36.8 C) (Oral)   Resp 19   SpO2 95%   Physical Exam Vitals and nursing note reviewed.  Constitutional:      General: She is not in acute distress.    Appearance: She is not toxic-appearing.  HENT:     Head: Normocephalic.  Eyes:     Extraocular Movements: Extraocular movements intact.     Pupils: Pupils are equal, round, and reactive to light.  Neck:     Comments: No cervical midline tenderness. Cardiovascular:     Rate and Rhythm: Normal rate and regular rhythm.     Pulses: Normal pulses.     Heart sounds: Normal heart sounds. No murmur heard.  No friction rub. No gallop.   Pulmonary:     Effort: Pulmonary effort is normal.     Breath sounds: Normal breath sounds.  Chest:       Comments: Tenderness palpation below right breast.  No crepitus or deformity. Abdominal:     General: Abdomen is flat. Bowel sounds are normal. There is no distension.     Palpations: Abdomen is soft.     Tenderness: There is no abdominal tenderness. There is no guarding or rebound.  Musculoskeletal:     Cervical back: Neck supple.     Comments: No thoracic or lumbar  midline tenderness. Tenderness over knuckles of 4-5th fingers on left hand. Full ROM of all fingers. Tenderness throughout lateral aspect of dorsum of left foot with overlying ecchymosis.  Full range of motion of left ankle.  Left upper and lower extremities neurovascularly intact.  Skin:    General: Skin is dry.  Neurological:     General: No focal deficit present.     Mental Status: She is alert.     Comments: Speech is clear, able to follow commands CN III-XII intact Normal strength in upper and lower extremities bilaterally including dorsiflexion and plantar flexion, strong and equal grip strength Sensation grossly intact throughout Moves extremities without ataxia, coordination  intact No pronator drift  Psychiatric:        Mood and Affect: Mood normal.        Behavior: Behavior normal.     ED Results / Procedures / Treatments   Labs (all labs ordered are listed, but only abnormal results are displayed) Labs Reviewed  BASIC METABOLIC PANEL - Abnormal; Notable for the following components:      Result Value   Glucose, Bld 138 (*)    Creatinine, Ser 1.11 (*)    GFR calc non Af Amer 45 (*)    GFR calc Af Amer 52 (*)    All other components within normal limits  CBC - Abnormal; Notable for the following components:   WBC 12.6 (*)    All other components within normal limits  URINALYSIS, ROUTINE W REFLEX MICROSCOPIC  CK  CBG MONITORING, ED  TROPONIN I (HIGH SENSITIVITY)  TROPONIN I (HIGH SENSITIVITY)    EKG None  Radiology DG Ribs Unilateral W/Chest Right  Result Date: 12/12/2019 CLINICAL DATA:  Dyspnea, right rib pain EXAM: RIGHT RIBS AND CHEST - 3+ VIEW COMPARISON:  09/19/2019 FINDINGS: The lungs are mildly hyperinflated. Multiple nodular opacities are seen throughout the right hemithorax, similar to multiple prior examinations and compatible with peripherally impacted airways better seen on chest CT of 04/04/2016. Right upper lobe pulmonary nodule is unchanged. Several  additional nodular opacities, however, are identified adjacent to the dominant nodule with a more focal nodule measuring up to 7 mm. The left lung is clear. No pneumothorax or pleural effusion. Cardiac size within normal limits. Rib films demonstrate no acute fracture or destructive osseous lesion. IMPRESSION: Multiple nodular opacities within the right lung most in keeping with multiple impacted airways in this patient with known underlying bronchiectasis. One additional nodule, however, is identified and follow-up chest radiograph in 6-12 weeks would be helpful in documenting resolution as this may be infectious or inflammatory in nature. If persistent, definitive imaging with CT examination may be helpful for further evaluation. Electronically Signed   By: Fidela Salisbury MD   On: 12/12/2019 14:48   CT Head Wo Contrast  Result Date: 12/12/2019 CLINICAL DATA:  Syncope, palpitations, fell EXAM: CT HEAD WITHOUT CONTRAST CT CERVICAL SPINE WITHOUT CONTRAST TECHNIQUE: Multidetector CT imaging of the head and cervical spine was performed following the standard protocol without intravenous contrast. Multiplanar CT image reconstructions of the cervical spine were also generated. COMPARISON:  04/19/2018 FINDINGS: CT HEAD FINDINGS Brain: Scattered hypodensities throughout the periventricular and subcortical white matter are compatible with chronic small vessel ischemic changes. Dilated perivascular space right basal ganglia unchanged. No acute infarct or hemorrhage. Lateral ventricles and remaining midline structures are normal. No acute extra-axial fluid collections. No mass effect. Vascular: No hyperdense vessel or unexpected calcification. Skull: Normal. Negative for fracture or focal lesion. Sinuses/Orbits: No acute finding. Other: None. CT CERVICAL SPINE FINDINGS Alignment: Alignment is anatomic. Skull base and vertebrae: No acute displaced fractures. Soft tissues and spinal canal: No prevertebral fluid or swelling.  No visible canal hematoma. Disc levels: Mild spondylosis at C4/C5. No significant compressive sequela. Upper chest: Airway is patent. Lung apices are clear. Other: Reconstructed images demonstrate no additional findings. IMPRESSION: 1. No acute intracranial process. 2. No acute cervical spine fracture. Electronically Signed   By: Randa Ngo M.D.   On: 12/12/2019 15:21   CT Cervical Spine Wo Contrast  Result Date: 12/12/2019 CLINICAL DATA:  Syncope, palpitations, fell EXAM: CT HEAD WITHOUT CONTRAST CT CERVICAL SPINE WITHOUT CONTRAST TECHNIQUE: Multidetector CT  imaging of the head and cervical spine was performed following the standard protocol without intravenous contrast. Multiplanar CT image reconstructions of the cervical spine were also generated. COMPARISON:  04/19/2018 FINDINGS: CT HEAD FINDINGS Brain: Scattered hypodensities throughout the periventricular and subcortical white matter are compatible with chronic small vessel ischemic changes. Dilated perivascular space right basal ganglia unchanged. No acute infarct or hemorrhage. Lateral ventricles and remaining midline structures are normal. No acute extra-axial fluid collections. No mass effect. Vascular: No hyperdense vessel or unexpected calcification. Skull: Normal. Negative for fracture or focal lesion. Sinuses/Orbits: No acute finding. Other: None. CT CERVICAL SPINE FINDINGS Alignment: Alignment is anatomic. Skull base and vertebrae: No acute displaced fractures. Soft tissues and spinal canal: No prevertebral fluid or swelling. No visible canal hematoma. Disc levels: Mild spondylosis at C4/C5. No significant compressive sequela. Upper chest: Airway is patent. Lung apices are clear. Other: Reconstructed images demonstrate no additional findings. IMPRESSION: 1. No acute intracranial process. 2. No acute cervical spine fracture. Electronically Signed   By: Randa Ngo M.D.   On: 12/12/2019 15:21   DG Hand Complete Left  Result Date:  12/12/2019 CLINICAL DATA:  Right hand pain EXAM: LEFT HAND - COMPLETE 3+ VIEW COMPARISON:  None. FINDINGS: There is no evidence of fracture or dislocation. There is no evidence of arthropathy or other focal bone abnormality. Soft tissues are unremarkable. IMPRESSION: Negative. Electronically Signed   By: Fidela Salisbury MD   On: 12/12/2019 14:49   DG Foot Complete Left  Result Date: 12/12/2019 CLINICAL DATA:  Left foot pain EXAM: LEFT FOOT - COMPLETE 3+ VIEW COMPARISON:  None. FINDINGS: Three view radiograph of the left foot demonstrates an acute, oblique, mid-diaphyseal fracture of the left fifth metatarsal with approximately 1 cortical with medial and dorsal displacement of the distal fracture fragment. Fracture fragments are in near anatomic alignment. Moderate overlying soft tissue swelling. No other fracture or dislocation identified. Joint spaces are preserved. IMPRESSION: Mildly displaced extra-articular left fifth metatarsal diaphyseal fracture. Electronically Signed   By: Fidela Salisbury MD   On: 12/12/2019 14:50    Procedures Procedures (including critical care time)  Medications Ordered in ED Medications  sodium chloride flush (NS) 0.9 % injection 3 mL (3 mLs Intravenous Not Given 12/12/19 1324)  lidocaine (LIDODERM) 5 % 1 patch (has no administration in time range)  ibuprofen (ADVIL) tablet 400 mg (has no administration in time range)  sodium chloride 0.9 % bolus 500 mL (500 mLs Intravenous New Bag/Given 12/12/19 1524)  acetaminophen (TYLENOL) tablet 650 mg (650 mg Oral Given 12/12/19 1523)    ED Course  I have reviewed the triage vital signs and the nursing notes.  Pertinent labs & imaging results that were available during my care of the patient were reviewed by me and considered in my medical decision making (see chart for details).  Clinical Course as of Dec 11 1524  Fri Dec 12, 2019  1400 WBC(!): 12.6 [CA]  1415 WBC(!): 12.6 [CA]  1501 Glucose(!): 138 [CA]  1501 Creatinine(!):  1.11 [CA]    Clinical Course User Index [CA] Suzy Bouchard, PA-C   MDM Rules/Calculators/A&P                         86 year old female presents to the ED after a syncopal episode that occurred around 5 AM this morning preceded by palpitations.  Patient not currently on any blood thinners.  Patient has a history of paroxysmal A. fib and is followed by  [facility] cardiology.  Following the syncopal episode, patient admits to left hand/foot pain and right-sided rib pain.  She also admits to continued chest tightness associated with shortness of breath.  Upon arrival, stable vitals.  Patient is afebrile, not tachycardic or hypoxic.  Patient in no acute distress and nontoxic-appearing.  Physical exam reassuring.  No cervical, thoracic, or lumbar midline tenderness.  Normal neurological exam.  Reproducible right-sided rib tenderness underneath right breast with no crepitus or deformity.  Will obtain CT head and C-spine to rule out bony fracture and intracranial abnormalities given possible head injury.  Will also obtain chest x-ray with right-sided ribs, left hand, and left foot x-ray to rule out bony fractures.  Routine labs and troponin ordered. Discussed case with Dr. Kathrynn Humble who evaluated patient at bedside and agrees with assessment and plan. Low suspicion for PE/DVT.  CBC reassuring with mild leukocytosis at 12.6 likely due to stress reaction.  BMP reassuring with mild hyperglycemia 138 with no anion gap. Mild elevation in creatinine at 1.11. IVFs given here in the ED.  Doubt DKA.  Chest x-ray personally reviewed which demonstrates: IMPRESSION:  Multiple nodular opacities within the right lung most in keeping  with multiple impacted airways in this patient with known underlying  bronchiectasis. One additional nodule, however, is identified and  follow-up chest radiograph in 6-12 weeks would be helpful in  documenting resolution as this may be infectious or inflammatory in  nature. If persistent,  definitive imaging with CT examination may be  helpful for further evaluation.   Left hand x-ray personally reviewed which is negative for any bony fractures.  Left foot x-ray personally reviewed which demonstrates: IMPRESSION:  Mildly displaced extra-articular left fifth metatarsal diaphyseal  fracture.   Patient placed in cam walker. Orthopedic referral given to patient at discharge. Instructed patient to call the office tomorrow to schedule an appointment for further evaluation.   Patient handed off to [facility], PA-C at shift change pending CT head/cervical spine, UA, CK, and troponin. Patient will need delta troponin prior to discharge. If labs and imaging are unremarkable, patient may be discharged home with orthopedic, cardiology and PCP follow-up.  Final Clinical Impression(s) / ED Diagnoses Final diagnoses:  Syncope and collapse  Closed displaced fracture of fifth metatarsal bone of left foot, initial encounter    Rx / DC Orders ED Discharge Orders         Ordered    acetaminophen (TYLENOL 8 HOUR) 650 MG CR tablet  Every 8 hours PRN     Discontinue  Reprint     12/12/19 1515           Suzy Bouchard, PA-C 12/12/19 1526    Varney Biles, MD 12/16/19 1019

## 2019-12-12 NOTE — ED Triage Notes (Signed)
Patient brought in by [facility] from home after syncopal episode. Patient states she has been feeling weak for a few months and was awakened this morning by palpitations. Went to the bathroom to retreive some medications and woke up on the floor. Patient alert, oriented, and in no apparent distress at this time.

## 2019-12-12 NOTE — Progress Notes (Signed)
Orthopedic Tech Progress Note Patient Details:  Corra Kaine 06/03/1933 [id number]  Ortho Devices Type of Ortho Device: CAM walker Ortho Device/Splint Location: LLE Ortho Device/Splint Interventions: Ordered, Adjustment   Post Interventions Patient Tolerated: Well Instructions Provided: Care of device, Adjustment of device, Poper ambulation with device   Marketta Valadez 12/12/2019, 4:10 PM

## 2019-12-12 NOTE — Telephone Encounter (Signed)
New Message  Pt c/o Syncope: STAT if syncope occurred within 30 minutes and pt complains of lightheadedness High Priority if episode of passing out, completely, today or in last 24 hours   1. Did you pass out today? Yes  2. When is the last time you passed out? 5 am  3. Has this occurred multiple times? No, once  4. Did you have any symptoms prior to passing out?  Heart racing, took metoprolol tartrate (LOPRESSOR) 25 MG tablet then passed out afterwards, states that she is dealing with dizziness now, heart racing, and now is in pain from fall.                    BP 112/76 HR 126 (10:30 am)

## 2019-12-12 NOTE — ED Notes (Signed)
Patient verbalizes understanding of discharge instructions. Opportunity for questioning and answers were provided. Armband removed by staff, pt discharged from ED ambulatory w/ son 

## 2019-12-12 NOTE — ED Provider Notes (Signed)
Care assumed from [place], please see her not for full details, but in brief [name] is a 86 y.o. female presents after a syncopal episode, hx of the same, patient thinks that the episode occurred around 5 AM this morning when she woke up with palpitations and chest tightness, she was walking to the bathroom to get her metoprolol and then woke up on the floor.  After episode patient reports generalized weakness and continued chest tightness but denies any additional palpitations.  Does report headache and thinks that she hit her head, is not on blood thinners.  Also complaining of left hand and foot pain, and right sided rib pain.  Work-up thus far shows mild leukocytosis of 12.6, slightly elevated creatinine of 1.11, glucose 130, no other significant electrolyte derangements, troponin, CK and urinalysis are pending.  X-ray significant for left fifth metatarsal fracture, will be placed in a cam walker boot with orthopedic follow-up, hand and rib x-rays are unremarkable.  CTs of the head and C-spine are pending.  Plan: Follow up on pending lab work and CTs  BP 97/62   Pulse 79   Temp 98.3 F (36.8 C) (Oral)   Resp 19   SpO2 95%    ED Course/Procedures   Labs Reviewed  BASIC METABOLIC PANEL - Abnormal; Notable for the following components:      Result Value   Glucose, Bld 138 (*)    Creatinine, Ser 1.11 (*)    GFR calc non Af Amer 45 (*)    GFR calc Af Amer 52 (*)    All other components within normal limits  CBC - Abnormal; Notable for the following components:   WBC 12.6 (*)    All other components within normal limits  TROPONIN I (HIGH SENSITIVITY) - Abnormal; Notable for the following components:   Troponin I (High Sensitivity) 20 (*)    All other components within normal limits  CK  URINALYSIS, ROUTINE W REFLEX MICROSCOPIC  CBG MONITORING, ED  TROPONIN I (HIGH SENSITIVITY)     DG Ribs Unilateral W/Chest Right  Result Date: 12/12/2019 CLINICAL DATA:   Dyspnea, right rib pain EXAM: RIGHT RIBS AND CHEST - 3+ VIEW COMPARISON:  09/19/2019 FINDINGS: The lungs are mildly hyperinflated. Multiple nodular opacities are seen throughout the right hemithorax, similar to multiple prior examinations and compatible with peripherally impacted airways better seen on chest CT of 04/04/2016. Right upper lobe pulmonary nodule is unchanged. Several additional nodular opacities, however, are identified adjacent to the dominant nodule with a more focal nodule measuring up to 7 mm. The left lung is clear. No pneumothorax or pleural effusion. Cardiac size within normal limits. Rib films demonstrate no acute fracture or destructive osseous lesion. IMPRESSION: Multiple nodular opacities within the right lung most in keeping with multiple impacted airways in this patient with known underlying bronchiectasis. One additional nodule, however, is identified and follow-up chest radiograph in 6-12 weeks would be helpful in documenting resolution as this may be infectious or inflammatory in nature. If persistent, definitive imaging with CT examination may be helpful for further evaluation. Electronically Signed   By: Fidela Salisbury MD   On: 12/12/2019 14:48   CT Head Wo Contrast  Result Date: 12/12/2019 CLINICAL DATA:  Syncope, palpitations, fell EXAM: CT HEAD WITHOUT CONTRAST CT CERVICAL SPINE WITHOUT CONTRAST TECHNIQUE: Multidetector CT imaging of the head and cervical spine was performed following the standard protocol without intravenous contrast. Multiplanar CT image reconstructions of the cervical spine were also generated. COMPARISON:  04/19/2018 FINDINGS: CT HEAD FINDINGS Brain: Scattered hypodensities throughout the periventricular and subcortical white matter are compatible with chronic small vessel ischemic changes. Dilated perivascular space right basal ganglia unchanged. No acute infarct or hemorrhage. Lateral ventricles and remaining midline structures are normal. No acute  extra-axial fluid collections. No mass effect. Vascular: No hyperdense vessel or unexpected calcification. Skull: Normal. Negative for fracture or focal lesion. Sinuses/Orbits: No acute finding. Other: None. CT CERVICAL SPINE FINDINGS Alignment: Alignment is anatomic. Skull base and vertebrae: No acute displaced fractures. Soft tissues and spinal canal: No prevertebral fluid or swelling. No visible canal hematoma. Disc levels: Mild spondylosis at C4/C5. No significant compressive sequela. Upper chest: Airway is patent. Lung apices are clear. Other: Reconstructed images demonstrate no additional findings. IMPRESSION: 1. No acute intracranial process. 2. No acute cervical spine fracture. Electronically Signed   By: Randa Ngo M.D.   On: 12/12/2019 15:21   CT Cervical Spine Wo Contrast  Result Date: 12/12/2019 CLINICAL DATA:  Syncope, palpitations, fell EXAM: CT HEAD WITHOUT CONTRAST CT CERVICAL SPINE WITHOUT CONTRAST TECHNIQUE: Multidetector CT imaging of the head and cervical spine was performed following the standard protocol without intravenous contrast. Multiplanar CT image reconstructions of the cervical spine were also generated. COMPARISON:  04/19/2018 FINDINGS: CT HEAD FINDINGS Brain: Scattered hypodensities throughout the periventricular and subcortical white matter are compatible with chronic small vessel ischemic changes. Dilated perivascular space right basal ganglia unchanged. No acute infarct or hemorrhage. Lateral ventricles and remaining midline structures are normal. No acute extra-axial fluid collections. No mass effect. Vascular: No hyperdense vessel or unexpected calcification. Skull: Normal. Negative for fracture or focal lesion. Sinuses/Orbits: No acute finding. Other: None. CT CERVICAL SPINE FINDINGS Alignment: Alignment is anatomic. Skull base and vertebrae: No acute displaced fractures. Soft tissues and spinal canal: No prevertebral fluid or swelling. No visible canal hematoma. Disc  levels: Mild spondylosis at C4/C5. No significant compressive sequela. Upper chest: Airway is patent. Lung apices are clear. Other: Reconstructed images demonstrate no additional findings. IMPRESSION: 1. No acute intracranial process. 2. No acute cervical spine fracture. Electronically Signed   By: Randa Ngo M.D.   On: 12/12/2019 15:21   DG Hand Complete Left  Result Date: 12/12/2019 CLINICAL DATA:  Right hand pain EXAM: LEFT HAND - COMPLETE 3+ VIEW COMPARISON:  None. FINDINGS: There is no evidence of fracture or dislocation. There is no evidence of arthropathy or other focal bone abnormality. Soft tissues are unremarkable. IMPRESSION: Negative. Electronically Signed   By: Fidela Salisbury MD   On: 12/12/2019 14:49   DG Foot Complete Left  Result Date: 12/12/2019 CLINICAL DATA:  Left foot pain EXAM: LEFT FOOT - COMPLETE 3+ VIEW COMPARISON:  None. FINDINGS: Three view radiograph of the left foot demonstrates an acute, oblique, mid-diaphyseal fracture of the left fifth metatarsal with approximately 1 cortical with medial and dorsal displacement of the distal fracture fragment. Fracture fragments are in near anatomic alignment. Moderate overlying soft tissue swelling. No other fracture or dislocation identified. Joint spaces are preserved. IMPRESSION: Mildly displaced extra-articular left fifth metatarsal diaphyseal fracture. Electronically Signed   By: Fidela Salisbury MD   On: 12/12/2019 14:50    Procedures  MDM    CTs of the head and cervical spine are unremarkable.  Patient has been placed in a cam walker boot for her metatarsal fracture, she is not a good candidate for crutches but states that she already has a wheelchair in DME equipment at home that she can use to remain nonweightbearing until  she is followed up with orthopedics.  Her initial troponin was mildly elevated at 20, I went in and spoke with her and she reports that the chest tightness she was having earlier has resolved and she has had  no further episodes of palpitations, cardiac monitoring reviewed while patient has been here in the ED and she has not had any episodes of arrhythmia.  I discussed case with Dr. Marlou Porch with cardiology who reviewed patient's chart and evaluation with Dr. Katharina Caper it appears that patient has had multiple similar syncopal episodes previously but has not been able to tolerate Zio patch for prolonged cardiac monitoring due to issues with skin sensitivity with adhesive's.  Dr. Marlou Porch feels that as long as her troponin is downtrending she has not had any additional palpitations or cardiac events here in the ED that she would be stable for discharge home as long as the patient is agreeable with that.  I discussed this with the patient and she is in agreement.  Her second troponin is downtrending at 16.  Will discharge patient home with close follow-up with her cardiologist and with orthopedics, she was previously seen by Dr. Gladstone Lighter with [place].  She does not want stronger pain medication but has been discharged home with Tylenol and ibuprofen for foot fracture and incentive spirometer for likely rib contusion due to some right-sided pain.  She has no oxygen requirement has not tachypneic or splinting.  Discussed return precautions and care at home and patient expresses understanding and agreement.     Jacqlyn Larsen, PA-C 12/13/19 0104    Tegeler, Gwenyth Allegra, MD 12/15/19 [phone]

## 2019-12-12 NOTE — Care Management (Signed)
ED CM received CM consults for assistance with DME CM met with patient at bedside, and patient reports having a wheel and support at home not additional ED CM needs identified.

## 2019-12-12 NOTE — Discharge Instructions (Addendum)
Your left foot x-ray showed a broken bone. I have placed you in a boot. Please keep boot on until you see the orthopedic surgeon. Call Dr. Tama Headings office or [facility] tomorrow to schedule an appointment for further evaluation. I am sending you home with Tylenol as needed for pain.   Your heart enzymes were initially mildly elevated but have improved while here in the ED and your cardiac monitoring has not showed any arrhythmias or abnormal heart rhythms here today.  I would like for you to follow-up closely with Dr. Acie Fredrickson but I spoke with your cardiologist today and they do not feel that you need to be admitted for monitoring, especially since you have had similar episodes previously.  For pain related to your foot fracture and rib pain you can take Tylenol 650 mg every 6 hours and ibuprofen 400 mg every 6 hours.  Make sure you take ibuprofen with food on your stomach.  You should also use the incentive spirometer you have been given every few hours to help ensure that you are taking good deep breaths to help prevent pneumonia from bruising and pain over your ribs.  Please return to the ED for further syncopal episodes, chest pain, palpitations or any other new or concerning symptoms.  Your chest x-ray showed numerous nodules. Please follow-up with your PCP for further evaluation.

## 2019-12-12 NOTE — Telephone Encounter (Signed)
Spoke with Pt.  Pt had a syncopal episode this morning and continues to have heart racing.  She also states she has a pain in her lung.  Advised Pt to call 911.  Pt will go to [facility].

## 2019-12-16 DIAGNOSIS — M25512 Pain in left shoulder: Secondary | ICD-10-CM | POA: Diagnosis not present

## 2019-12-16 DIAGNOSIS — R0781 Pleurodynia: Secondary | ICD-10-CM | POA: Diagnosis not present

## 2019-12-16 DIAGNOSIS — S92355A Nondisplaced fracture of fifth metatarsal bone, left foot, initial encounter for closed fracture: Secondary | ICD-10-CM | POA: Diagnosis not present

## 2019-12-16 DIAGNOSIS — S92353A Displaced fracture of fifth metatarsal bone, unspecified foot, initial encounter for closed fracture: Secondary | ICD-10-CM | POA: Insufficient documentation

## 2019-12-23 DIAGNOSIS — S92355A Nondisplaced fracture of fifth metatarsal bone, left foot, initial encounter for closed fracture: Secondary | ICD-10-CM | POA: Diagnosis not present

## 2020-01-06 DIAGNOSIS — M79672 Pain in left foot: Secondary | ICD-10-CM | POA: Diagnosis not present

## 2020-01-26 DIAGNOSIS — S92355A Nondisplaced fracture of fifth metatarsal bone, left foot, initial encounter for closed fracture: Secondary | ICD-10-CM | POA: Diagnosis not present

## 2020-01-27 ENCOUNTER — Other Ambulatory Visit: Payer: Self-pay

## 2020-01-27 ENCOUNTER — Encounter: Payer: Self-pay | Admitting: Cardiology

## 2020-01-27 ENCOUNTER — Ambulatory Visit (INDEPENDENT_AMBULATORY_CARE_PROVIDER_SITE_OTHER): Payer: Medicare Other | Admitting: Cardiology

## 2020-01-27 VITALS — BP 122/78 | HR 84 | Ht 64.0 in | Wt 123.4 lb

## 2020-01-27 DIAGNOSIS — R55 Syncope and collapse: Secondary | ICD-10-CM

## 2020-01-27 NOTE — Patient Instructions (Addendum)
Medication Instructions:  Your physician recommends that you continue on your current medications as directed. Please refer to the Current Medication list given to you today.  *If you need a refill on your cardiac medications before your next appointment, please call your pharmacy*   Lab Work: None ordered   Testing/Procedures: None ordered   Follow-Up: At [facility], you and your health needs are our priority.  As part of our continuing mission to provide you with exceptional heart care, we have created designated Provider Care Teams.  These Care Teams include your primary Cardiologist (physician) and Advanced Practice Providers (APPs -  Physician Assistants and Nurse Practitioners) who all work together to provide you with the care you need, when you need it.  We recommend signing up for the patient portal called "MyChart".  Sign up information is provided on this After Visit Summary.  MyChart is used to connect with patients for Virtual Visits (Telemedicine).  Patients are able to view lab/test results, encounter notes, upcoming appointments, etc.  Non-urgent messages can be sent to your provider as well.   To learn more about what you can do with MyChart, go to [email or web address].    Your next appointment:   1 week(s)  The format for your next appointment:   In Person  Provider:   Device clinic for a wound check    Thank you for choosing [facility]!!   Trinidad Curet, RN [phone]    Other Instructions   Implantable Loop Recorder Placement  An implantable loop recorder is a small electronic device that is placed under the skin of your chest. It is about the size of an AA ("double A") battery. The device records the electrical activity of your heart over a long period of time. Your health care provider can download these recordings to monitor your heart. You may need an implantable loop recorder if you have periods of abnormal heart activity  (arrhythmias) or unexplained fainting (syncope). The recorder can be left in place for 1 year or longer. Tell a health care provider about:  Any allergies you have.  All medicines you are taking, including vitamins, herbs, eye drops, creams, and over-the-counter medicines.  Any problems you or family members have had with anesthetic medicines.  Any blood disorders you have.  Any surgeries you have had.  Any medical conditions you have.  Whether you are pregnant or may be pregnant. What are the risks? Generally, this is a safe procedure. However, problems may occur, including:  Infection.  Bleeding.  Allergic reactions to anesthetic medicines.  Damage to nerves or blood vessels.  Failure of the device to work. This could require another surgery to replace it. What happens before the procedure?   You may have a physical exam, blood tests, and imaging tests of your heart, such as a chest X-ray.  Follow instructions from your health care provider about eating or drinking restrictions.  Ask your health care provider about: ? Changing or stopping your regular medicines. This is especially important if you are taking diabetes medicines or blood thinners. ? Taking medicines such as aspirin and ibuprofen. These medicines can thin your blood. Do not take these medicines unless your health care provider tells you to take them. ? Taking over-the-counter medicines, vitamins, herbs, and supplements.  Ask your health care provider how your surgical site will be marked or identified.  Ask your health care provider what steps will be taken to help prevent infection. These may include: ? Removing hair at  the surgery site. ? Washing skin with a germ-killing soap.  Plan to have someone take you home from the hospital or clinic.  Plan to have a responsible adult care for you for at least 24 hours after you leave the hospital or clinic. This is important.  Do not use any products that  contain nicotine or tobacco, such as cigarettes and e-cigarettes. If you need help quitting, ask your health care provider. What happens during the procedure?  An IV will be inserted into one of your veins.  You may be given one or more of the following: ? A medicine to help you relax (sedative). ? A medicine to numb the area (local anesthetic).  A small incision will be made on the left side of your upper chest.  A pocket will be created under your skin.  The device will be placed in the pocket.  The incision will be closed with stitches (sutures) or adhesive strips.  A bandage (dressing) will be placed over the incision. The procedure may vary among health care providers and hospitals. What happens after the procedure?  Your blood pressure, heart rate, breathing rate, and blood oxygen level will be monitored until you leave the hospital or clinic.  You may be able to go home on the day of your surgery. Before you go home: ? Your health care provider will program your recorder. ? You will learn how to trigger your device with a handheld activator. ? You will learn how to send recordings to your health care provider. ? You will get an ID card for your device, and you will be told when to use it.  Do not drive for 24 hours if you were given a sedative during your procedure. Summary  An implantable loop recorder is a small electronic device that is placed under the skin of your chest to monitor your heart over a long period of time.  The recorder can be left in place for 1 year or longer.  Plan to have someone take you home from the hospital or clinic. This information is not intended to replace advice given to you by your health care provider. Make sure you discuss any questions you have with your health care provider. Document Revised: 07/26/2017 Document Reviewed: 07/07/2017 Elsevier Patient Education  2020 [employer].

## 2020-01-27 NOTE — Progress Notes (Signed)
Electrophysiology Office Note   Date:  01/27/2020   ID:  [name], DOB 1933-06-02, MRN [id number]  PCP:  Orpah Melter, MD  Cardiologist:  Nahser Primary Electrophysiologist:  Asianna Brundage Meredith Leeds, MD    Chief Complaint: Syncope   History of Present Illness: [name] is a 86 y.o. female who is being seen today for the evaluation of syncope at the request of Nahser, Wonda Cheng, MD. Presenting today for electrophysiology evaluation.  She has a history significant for hypertension, paroxysmal atrial fibrillation, syncope.  She presented to the emergency room 7 at 921 after an episode of syncope.  She woke up with palpitations and chest tightness.  She was walking to the bathroom metoprolol and woke up on the floor.  After her episode, she had generalized weakness and some chest tightness.  Today, she denies symptoms of palpitations, chest pain, shortness of breath, orthopnea, PND, lower extremity edema, claudication, dizziness, presyncope, syncope, bleeding, or neurologic sequela. The patient is tolerating medications without difficulties.  Today she feels poorly, but most days she feels well.  She has weakness and fatigue today.  She had palpitations this morning, but those did go away.  She also is mildly nauseous.  This is abnormal for her.  She has had episodes of palpitations in the past which left her feeling this way as well.   Past Medical History:  Diagnosis Date  . Arm numbness left   . Back pain   . Bronchitis, chronic ([place])   . Depression   . Dizziness   . Fatigue   . Hypertension    mild  . Palpitations   . Skin cancer   . Stress   . Syncope    History of    Past Surgical History:  Procedure Laterality Date  . APPENDECTOMY    . CESAREAN SECTION  [phone]     Current Outpatient Medications  Medication Sig Dispense Refill  . acetaminophen (TYLENOL 8 HOUR) 650 MG CR tablet Take 1 tablet (650 mg total) by mouth every 8 (eight) hours as needed  for pain. 20 tablet 0  . Ascorbic Acid (VITAMIN C) 100 MG tablet Take 100 mg by mouth daily.    . cholecalciferol (VITAMIN D3) 25 MCG (1000 UNIT) tablet Take 1,000 Units by mouth daily.    . famotidine (PEPCID) 20 MG tablet One after bfast and bedtime (Patient not taking: Reported on 12/12/2019)    . folic acid (FOLVITE) 1 MG tablet Take 1 mg by mouth daily.    Marland Kitchen HYDROcodone-acetaminophen (NORCO/VICODIN) 5-325 MG tablet Take 1 tablet by mouth every 4 (four) hours as needed for moderate pain. (Patient taking differently: Take 1 tablet by mouth at bedtime. ) 40 tablet 0  . levofloxacin (LEVAQUIN) 500 MG tablet Take 1 tablet (500 mg total) by mouth daily. (Patient not taking: Reported on 12/12/2019) 10 tablet 5  . metoprolol tartrate (LOPRESSOR) 25 MG tablet Take 12.5 mg by mouth daily as needed (heart palpitations).     . Multiple Vitamins-Minerals (ONE-A-DAY WOMENS 50 PLUS PO) Take 1 tablet by mouth daily.     No current facility-administered medications for this visit.    Allergies:   Ciprofloxacin, Penicillins, Sulfonamide derivatives, Avelox [moxifloxacin], Reclast [zoledronic acid], and Chlorpheniramine   Social History:  The patient  reports that she has never smoked. She has never used smokeless tobacco. She reports current alcohol use. She reports that she does not use drugs.   Family History:  The patient's family history includes Alcohol abuse in  her father; Atrial fibrillation in her mother; Thyroid disease in her mother.    ROS:  Please see the history of present illness.   Otherwise, review of systems is positive for none.   All other systems are reviewed and negative.    PHYSICAL EXAM: VS:  There were no vitals taken for this visit. , BMI There is no height or weight on file to calculate BMI. GEN: Well nourished, well developed, in no acute distress  HEENT: normal  Neck: no JVD, carotid bruits, or masses Cardiac: RRR; no murmurs, rubs, or gallops,no edema  Respiratory:  clear to  auscultation bilaterally, normal work of breathing GI: soft, nontender, nondistended, + BS MS: no deformity or atrophy  Skin: warm and dry Neuro:  Strength and sensation are intact Psych: euthymic mood, full affect  EKG:  EKG is ordered today. Personal review of the ekg ordered 12/15/2019 shows sinus rhythm, rate 81, right axis deviation, low voltage  Recent Labs: 09/15/2019: TSH 1.950 12/12/2019: BUN 17; Creatinine, Ser 1.11; Hemoglobin 14.9; Platelets 247; Potassium 4.2; Sodium 140    Lipid Panel     Component Value Date/Time   CHOL 166 01/09/2011 0455   TRIG 69 01/09/2011 0455   HDL 55 01/09/2011 0455   CHOLHDL 3.0 01/09/2011 0455   VLDL 14 01/09/2011 0455   LDLCALC 97 01/09/2011 0455     Wt Readings from Last 3 Encounters:  09/19/19 120 lb 6.4 oz (54.6 kg)  09/15/19 120 lb 12.8 oz (54.8 kg)  03/11/19 119 lb 12.8 oz (54.3 kg)      Other studies Reviewed: Additional studies/ records that were reviewed today include: Cardiac monitor 10/23/2019 personally reviewed Review of the above records today demonstrates:   Sinus rhythm including sinus bradycardia ( predominately at night )  Episodes of PSVT lasting between 6-8 seconds.    ASSESSMENT AND PLAN:  1.  Paroxysmal atrial fibrillation: Currently not anticoagulated.  On metoprolol.  CHA2DS2-VASc of 4. We [name] hold off on anticoagulation due to episodes of syncope.   2.  Syncope: She wore a cardiac monitor that showed no evidence of arrhythmia that would cause syncope.  She has had multiple episodes without an obvious cause.,  [name] plan for [name].  Risks and benefits were discussed with bleeding and infection.  She understands these risks and is agreed to the procedure.  Case discussed with primary cardiology  Case discussed with primary cardiology  Current medicines are reviewed at length with the patient today.   The patient does not have concerns regarding her medicines.  The following changes were  made today:  none  Labs/ tests ordered today include:  No orders of the defined types were placed in this encounter.    Disposition:   FU with [name] pending link results  Signed, Rita Gentzler Meredith Leeds, MD  01/27/2020 1:58 PM     [address] [place] [address] [phone] (office) [phone] (fax)  SURGEON:  Allegra Lai, MD     PREPROCEDURE DIAGNOSIS:  Syncope    POSTPROCEDURE DIAGNOSIS:  Syncope     PROCEDURES:   1. Implantable loop recorder implantation    INTRODUCTION:  Emmeline Winebarger is a 86 y.o. female with a history of syncope who presents today for implantable loop implantation.  The patient has had syncope without a cause identified.   she has worn telemetry previously during which she did not have arrhythmias.  There is significant concern for possible arrhythmia as the cause  for the syncope. The patient therefore presents today for implantable loop implantation.     DESCRIPTION OF PROCEDURE:  Informed written consent was obtained, and the patient was brought to the electrophysiology lab in a fasting state.  The patient required no sedation for the procedure today.  Mapping over the patient's chest was performed by the EP lab staff to identify the area where electrograms were most prominent for ILR recording.  This area was found to be the left parasternal region over the 3rd-4th intercostal space. The patients left chest was therefore prepped and draped in the usual sterile fashion by the EP lab staff. The skin overlying the left parasternal region was infiltrated with lidocaine for local analgesia.  A 0.5-cm incision was made over the left parasternal region over the 3rd intercostal space.  A subcutaneous ILR pocket was fashioned using a combination of sharp and blunt dissection.  A Medtronic Reveal Rita model [place] [id number] implantable loop recorder was then placed into the pocket  R waves were very prominent and  measured 0.36mV.  Steri- Strips and a sterile dressing were then applied.  There were no early apparent complications.     CONCLUSIONS:   1. Successful implantation of a Medtronic Reveal Rita implantable loop recorder for syncope  2. No early apparent complications.   Treyvin Glidden Meredith Leeds, MD 01/27/2020 3:23 PM

## 2020-01-28 ENCOUNTER — Telehealth: Payer: Self-pay | Admitting: Cardiology

## 2020-01-28 NOTE — Telephone Encounter (Signed)
Patient states she had a loop recorder implanted yesterday and it has been hurting all night. She states she took a tylenol and the patch over the loop recorder won't come off. She would like to know if she can use water to get it off.

## 2020-01-28 NOTE — Telephone Encounter (Signed)
Patient reports of pain to site. Advised she can take tylenol for pain. Advised patient that when swelling reduces that should help her pain as wel.  Patient advised to take otter dressing off tonight and she can use a small amount of water to edge of gauze while making sure site stays free of water. Verbalizes understanding.  Patient advised to call device clinic at [phone] for further questions or concerns.

## 2020-02-02 ENCOUNTER — Telehealth: Payer: Self-pay | Admitting: *Deleted

## 2020-02-02 NOTE — Telephone Encounter (Signed)
Spoke with pt. She denies symptoms with tachy episode on 8/29. Reports her LINQ site has been very painful so she marked symptom episodes on 8/24 and 8/25. Reports extensive bruising at left chest and breast. She is unsure if it has improved any since implant. Pt agreeable to wound check on 02/05/20 at 4:30pm (needs PM appointments). She agrees to call back prior to this visit if any worsening symptoms.

## 2020-02-02 NOTE — Telephone Encounter (Signed)
LINQ alert received for tachy episode on 02/01/20 at 14:07, 9 sec duration. ECG appears SVT 160s. 2 symptom episodes from 8/24 and 8/25 appear SR.   LMOVM requesting call back to DC. Direct number and office hours provided.

## 2020-02-03 ENCOUNTER — Ambulatory Visit (INDEPENDENT_AMBULATORY_CARE_PROVIDER_SITE_OTHER): Payer: Medicare Other | Admitting: Internal Medicine

## 2020-02-03 ENCOUNTER — Encounter: Payer: Self-pay | Admitting: Internal Medicine

## 2020-02-03 ENCOUNTER — Other Ambulatory Visit: Payer: Self-pay

## 2020-02-03 DIAGNOSIS — J479 Bronchiectasis, uncomplicated: Secondary | ICD-10-CM | POA: Diagnosis not present

## 2020-02-03 DIAGNOSIS — E041 Nontoxic single thyroid nodule: Secondary | ICD-10-CM

## 2020-02-03 NOTE — Assessment & Plan Note (Addendum)
Onset ? Age 86 wit bronchiectasis first confirmed 2007  - Alpha one Screen  August 18, 2010 = MM  - See CT Chest 09/29/05 Stable bronchiectasis in right middle lobe and lingula.  Scattered tree in bud opacities throughout the lungs, right greater than left,  nonspecific.   - PFT's 06/21/2012 1.67 (91%) ratio 64 and no change,  DLCO 77% - Flutter valve added 04/27/2014  - PFTs 05/24/15   FEV1 1.55 (81%) ratio 69 p 6% improvement from saba and dlco 62% ratio 88%  - HRCT 04/04/16 >>> 1. 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 2. An incidental finding of potential clinical significance has been found. Dominant nodule in the right upper lobe, likely infectious/inflammatory in etiology.  - Zmax 250 mg daily 03/01/16 - 05/01/16 improved cough at d/c but diarrhea while on it and no change in nodule RUL > d/c 07/07/16 - 08/28/2017 added back cycles of zpak prn flare of purulent bronchitis > d/c 05/20/2018  - levaquin 500 x 10 days prn 06/11/2018 - flutter valve training 06/24/18 Chest CT 09/09/2018 Increased tree-in-bud nodularity in right lower lobe since 2016   Adequate control on present rx, reviewed in detail with pt > no change in rx needed  - needs f/u cxr but "new nodules" are likely inflammatory and f/u cxr only  at 57 m is fine  Discussed in detail all the  indications, usual  risks and alternatives  relative to the benefits with patient who agrees to proceed with conservative f/u as outlined

## 2020-02-03 NOTE — Progress Notes (Signed)
Subjective:    Patient ID: [name], female    DOB: 1932-12-17    MRN: [id number]    Primary Provider/Referring Provider: Dr Doyle Askew   Brief patient profile:  41 yowf never smoker/ MM   with recurrent cough since age 86 with right middle lobe/lingular syndrome with limited bronchiectatic changes on  CT study dated 09/2005    History of Present Illness    03/01/2016  f/u ov/Burle Kwan re:  Bronchiectasis with uacs/ some better on gerd rx  Chief Complaint  Patient presents with  . Follow-up    Pt states her dry cough is unchanged since last OV. Pt states she has ocassional chest tightness and SOB but attributes this to stress. Pt also c/o hoarseness.    coughs up to an hour each am, completely non-productive even with flutter  rec No change in medications Nasty mucus > doxycline 100 mg twice daily x 10 days   Late add for nodular opacity RUL c/w MAI    zmax x 250 mg daily x 30 days then recheck cxr       03/29/2016  f/u ov/Chrysa Rampy re: obst bronchiectasis / uacs maint rx zmax  Chief Complaint  Patient presents with  . Follow-up    CXR was done today. She is coughing less but still having some hoarseness.  She gets SOB walking up stairs.    cough is now dry/  throat is worse off all gerd rx  Doe = MMRC1 = can walk nl pace, flat grade, can't hurry or go uphills or steps s sob   rec Please see patient coordinator before you leave today  to schedule HRCT chest  Continue zithromax daily for now  Pantoprazole (protonix) 40 mg   Take  30-60 min before first meal of the day and tagament 400  one @  Bedtime GERD diet     07/07/2016  f/u ov/Jaslyne Beeck re: obst bronchiectasis/ maint rx zmax Chief Complaint  Patient presents with  . Follow-up    Pt states cough is improving but she does c/o hoarseness.   rec Tagamet should be after bfast and supper  GERD diet Leave off maint zmax > no change in pattern      05/20/2018 cough since 04/2018  Sleeps on 2 pillows but takes hydrocodone  Cough   Mucus mostly white / using flutter valve mostly  in am  rec Prilosec 20mg  should be Take 30- 60 min before your first and last meals of the day as long as you are coughing or need  any cough medication  GERD diet  Depomedrol 120 mg IM  For cough mucinex or mucinex dm up 1200 mg every 12 hours and flutter valve and supplement with the hydrocodone      06/11/2018 acute  ov/Zulay Corrie re: bronchiectasis/ cough really never better since early Nov 2019 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 Acute Visit    severe cough x 2 months with white/yellow/green congestion. Has been taking medication but not helping.  Dyspnea:  Fine unless coughing  Cough: better at hs then starts up in  am after stirs and all day and lasts all day with variably purulent but never bloody mus   Sleeping: ok bed flat/ 2 pillows  rec Prednisone 10 mg take  4 each am x 2 days,   2 each am x 2 days,  1 each am x 2 days and stop  levaquin 500 mg one daily x 10 days -  stops if aches in tendons  Stop prilosec and start protonix 40 mg Take 30-60 min before first meal of the day and pepcid 20mg  after supper  For cough mucinex or mucinex dm up 1200 mg every 12 hours and flutter valve and supplement with the hydrocodone      06/24/2018  f/u ov/Tzion Wedel re: bronchiectasis with cough since early Nov 2019 not typical of her bronchiectasis Chief Complaint  Patient presents with  . Follow-up    cough with white mucus and ocasion green piece, ribs hurt from coughing, feel like eyes swollen  Dyspnea:  No change  = MMRC1 = can walk nl pace, flat grade, can't hurry or go uphills or steps s sob   Cough: dry > wet and day > night and?  nauseated form mucinex dm/ using flutter valve but only hs vicodin Sleeping: sleeping flat on L side s noct symptoms rec Take delsym two tsp every 12 hours and supplement if needed with vicodin mg up to 1-2 every 4 hours to suppress the urge to cough.   Once you have eliminated the cough  for 3 straight days try reducing the vicodin  first,  then the delsym as tolerated.   Use the flutter valve as much as possible and stop mucinex   Change the pepcid to where you take it after supper     07/22/2018  f/u ov/Ednamae Schiano re: bronchiectasis / cyclical cough since nov 2019 resolved  Chief Complaint  Patient presents with  . Follow-up    Cough has improved.   Dyspnea:  Not limited by breathing from desired activities   Cough: no mucus production/ no longer using delsym or vicodin  Sleeping: bed is flat, 2 pillows, prefers L side  SABA use: none 02: none  rec Mucinex dm would be a better choice to take as needed instead of the mucinex or delsym Vicodin is for emergency purposes when you can't step coughing  Keep the cat out of your bedroom if at all possible    Virtual Visit via Telephone Note 10/21/2018 re obst bronchiectasis/ cyclical cough   History of Present Illness:  Dyspnea:   sob - walks several hundred feet across parking lot daily  to office/ also walking once daily on street 10-36min and limited by strength/ energy  Cough: better now, cough occurs p waking up, no excess production Sleeping: flat bed with pillows  SABA use: none  02: no  Eating well but lots of lean foods and "don't know why not able to gain wt"  rec No change in medications  You need to eat more calorie to maintain your weight by eating more fatty meats/ starches     09/19/2019  f/u ov/Jaylan Hinojosa re: obst bronchiectasis  2nd pfizer 09/17/19 > tol ok  Chief Complaint  Patient presents with  . Follow-up    She states coughing up chunks of black sputum last wk. She states that if she lies down flat she coughs up green to yellow sputum.    Dyspnea:  Less active/ no power walks, works from home Cough: immediate when supine green, most am's cough x 40 min >  Same x months / using flutter and mucinex sparingly  Sleeping: bed is flat, 2 pillows  SABA use: none  02: none  Main concern is daytime tickle in  throat, has seen [facility]  in past  rec  GERD diet  For cough > mucinex dm 1200 mg every 12 hours and use the flutter valve as much  as you can Mucus gets nastier, thicker or bloody or fever > levaquin 500 mg daily x 10 days    02/03/2020  f/u ov/Kye Silverstein re: bronchiectasis  Chief Complaint  Patient presents with  . Follow-up    SOB and fatigue  Dyspnea:  Broke foot  When fell so no regular wallking now Cough: white, worse in am 's Sleeping: bed flat, 2 pillows  SABA use: none  02: none    No obvious day to day or daytime variability or assoc excess/ purulent sputum or mucus plugs or hemoptysis or cp or chest tightness, subjective wheeze or overt sinus or hb symptoms.     Also denies any obvious fluctuation of symptoms with weather or environmental changes or other aggravating or alleviating factors except as outlined above   No unusual exposure hx or h/o childhood pna/ asthma or knowledge of premature birth.  Current Allergies, Complete Past Medical History, Past Surgical History, Family History, and Social History were reviewed in [employer] record.  ROS  The following are not active complaints unless bolded Hoarseness, sore throat, dysphagia, dental problems, itching, sneezing,  nasal congestion or discharge of excess mucus or purulent secretions, ear ache,   fever, chills, sweats, unintended wt loss or wt gain, classically pleuritic or exertional cp,  orthopnea pnd or arm/hand swelling  or leg swelling, presyncope, palpitations, abdominal pain, anorexia, nausea, vomiting, diarrhea  or change in bowel habits or change in bladder habits, change in stools or change in urine, dysuria, hematuria,  rash, arthralgias, visual complaints, headache, numbness, weakness or ataxia or problems with walking or coordination,  change in mood or  memory.        Current Meds  Medication Sig  . acetaminophen (TYLENOL 8 HOUR) 650 MG CR tablet Take 1 tablet (650 mg total) by mouth  every 8 (eight) hours as needed for pain.  . Ascorbic Acid (VITAMIN C) 100 MG tablet Take 100 mg by mouth daily.  . cholecalciferol (VITAMIN D3) 25 MCG (1000 UNIT) tablet Take 1,000 Units by mouth daily.  . famotidine (PEPCID) 20 MG tablet One after bfast and bedtime  . folic acid (FOLVITE) 1 MG tablet Take 1 mg by mouth daily.  Marland Kitchen HYDROcodone-acetaminophen (NORCO/VICODIN) 5-325 MG tablet Take 1 tablet by mouth every 4 (four) hours as needed for moderate pain.  . metoprolol tartrate (LOPRESSOR) 25 MG tablet Take 12.5 mg by mouth daily as needed (heart palpitations).   . Multiple Vitamins-Minerals (ONE-A-DAY WOMENS 50 PLUS PO) Take 1 tablet by mouth daily.                Past Medical History:  BRONCHIECTASIS (ICD-494.0) see CT scan of the chest dated 09/29/2005  - Pneumovax April 28, 2009 (over 65 so last one)  Prevnar given 04/27/2014   - Alpha one Screen  August 18, 2010 = MM  - Sinus CT August 18, 2010 >>  neg  COUGH, CHRONIC (ICD-786.2)  Left Kidney Cyst  - 04/21/09 [facility] PROCEDURE(S): ULTRASOUND GUIDED AND FLUOROSCOPIC GUIDED LEFT RENAL CYST ASPIRATION AND SCLEROSIS  LUQ Abd pain 2011...............................................................Marland KitchenSchooler  - CT ABD 07/29/10 no etiology        Objective:   Physical Exam   02/03/2020       124  Wt  09/19/2019 120  07/22/2018        122  05/20/2018       129  11/30/2017        131  March 15,2012 137    HEENT :  pt wearing mask not removed for exam due to covid - 19 concerns.   NECK :  without JVD/Nodes/TM/ nl carotid upstrokes bilaterally   LUNGS: no acc muscle use,  Min barrel  contour chest wall with bilateral  slightly decreased bs s audible wheeze and  without cough on insp or exp maneuvers and min  Hyperresonant  to  percussion bilaterally     CV:  RRR  no s3 or murmur or increase in P2, and no edema   ABD:  soft and nontender with pos end  insp Hoover's  in the supine position. No bruits or organomegaly appreciated,  bowel sounds nl  MS:     ext warm without deformities, calf tenderness, cyanosis or clubbing No obvious joint restrictions   SKIN: warm and dry without lesions    NEURO:  alert, approp, nl sensorium with  no motor or cerebellar deficits apparent.          I personally reviewed images and agree with radiology impression as follows:  CXR:   12/12/19  Multiple nodular opacities within the right lung most in keeping with multiple impacted airways in this patient with known underlying bronchiectasis. One additional nodule, however, is identified and follow-up chest radiograph in 6-12 weeks would be helpful in documenting resolution as this may be infectious or inflammatory in nature. If persistent, definitive imaging with CT examination may be helpful for further evaluation.     Assessment & Plan:

## 2020-02-03 NOTE — Patient Instructions (Addendum)
I will refer you back to Dr Gala Lewandowsky office  re your thyroid nodule   Please schedule a follow up visit in 3  months but call sooner if needed

## 2020-02-03 NOTE — Assessment & Plan Note (Addendum)
U/S 04/13/2016  Dominant left lobe nodule measuring 2.1 cm needs fine-needle aspiration criteria. Biopsy  05/02/16 > non-specific but no ca> referred to endocrine 05/10/2016 > for possible thryoid surgery Dr Harlow Asa 12/2017  02/03/2020 referred back to Dr Harlow Asa          Each maintenance medication was reviewed in detail including emphasizing most importantly the difference between maintenance and prns and under what circumstances the prns are to be triggered using an action plan format where appropriate.  Total time for H and P, chart review, counseling, teaching device and generating customized AVS unique to this office visit / charting = 21 min

## 2020-02-05 ENCOUNTER — Other Ambulatory Visit: Payer: Self-pay

## 2020-02-05 ENCOUNTER — Ambulatory Visit (INDEPENDENT_AMBULATORY_CARE_PROVIDER_SITE_OTHER): Payer: Medicare Other | Admitting: Emergency Medicine

## 2020-02-05 DIAGNOSIS — R55 Syncope and collapse: Secondary | ICD-10-CM

## 2020-02-05 LAB — CUP PACEART INCLINIC DEVICE CHECK
Date Time Interrogation Session: 20210902170105
Implantable Pulse Generator Implant Date: 20210824

## 2020-02-05 NOTE — Patient Instructions (Signed)
Call the office if you have any swelling, drainage, or redness at incision site or if you develop a.fever or chills.

## 2020-02-05 NOTE — Progress Notes (Signed)
ILR wound check in clinic. Steri strips removed. Wound well healed, bruisig noted that extends into left breast. R-waves 0.59 m.V Patient c/o tenderness at loop implant site. DR Camnitz in to assess patient. No change in treatment plan. Home monitor transmitting nightly. Episodes reviewed after alerts received , asymptomatic and has been instructed to call if she as symptoms. . Questions answered.

## 2020-02-16 ENCOUNTER — Telehealth: Payer: Self-pay | Admitting: *Deleted

## 2020-02-16 DIAGNOSIS — S92355A Nondisplaced fracture of fifth metatarsal bone, left foot, initial encounter for closed fracture: Secondary | ICD-10-CM | POA: Diagnosis not present

## 2020-02-16 NOTE — Telephone Encounter (Signed)
Ok to increase alerts for AF timing. Also should take toprol XL 50 mg in the evening for rapid AF.

## 2020-02-16 NOTE — Telephone Encounter (Signed)
Spoke with pt.  Pt reports she used symptom activator as she very weak, also had some chest discomfort. Denies dizziness or syncope.  Reports she had similar episodes this AM, but data has not yet transmitted.    Explained LINQ findings.  Pt reports that she has not taken PRN Lopressor 12.5mg  since her last syncopal episode as she is concerned it was related.  Reports she hasn't checked BP recently.  Explained Dr. Macky Lower recommendations to start Toprol XL 50mg  due to elevated V rates in AF.  Pt declines, states she would rather try taking PRN Lopressor.  Reviewed dosing instructions.  Encouraged pt to monitor for symptoms of hypotension and to change positions slowly.  Advised to call back with any concerns regarding HRs or BPs. Pt verbalizes understanding and agreement with plan..  Pt wishes to discuss episodes further with Dr. Curt Bears.  Scheduled for OV on 03/02/20 at 4:00pm.

## 2020-02-16 NOTE — Telephone Encounter (Signed)
LINQ alert received.  3 symptom episodes; episode on 02/12/20 appears ST with a brief PAT. Others occurred 02/13/20 and appear AF/RVR.  On 02/13/20, there was 1 tachy (2 min 48 sec), appears AF/RVR. There was also 1 AF (3 hours 44 minutes).  LINQ implanted for syncope.  Per Dr. Macky Lower note from 01/27/20, "Paroxysmal atrial fibrillation: Currently not anticoagulated.  On metoprolol.  CHA2DS2-VASc of 4.  We will hold off on anticoagulation due to episodes of syncope."  Will plan to reprogram AF alerts if episodes not actionable.  Routed to Dr. Curt Bears to clarify actionable AF burden.

## 2020-03-02 ENCOUNTER — Encounter: Payer: Self-pay | Admitting: Cardiology

## 2020-03-02 ENCOUNTER — Ambulatory Visit (INDEPENDENT_AMBULATORY_CARE_PROVIDER_SITE_OTHER): Payer: Medicare Other | Admitting: Emergency Medicine

## 2020-03-02 ENCOUNTER — Ambulatory Visit (INDEPENDENT_AMBULATORY_CARE_PROVIDER_SITE_OTHER): Payer: Medicare Other | Admitting: Cardiology

## 2020-03-02 ENCOUNTER — Other Ambulatory Visit: Payer: Self-pay

## 2020-03-02 VITALS — BP 124/82 | HR 70 | Ht 64.0 in | Wt 123.0 lb

## 2020-03-02 DIAGNOSIS — R55 Syncope and collapse: Secondary | ICD-10-CM

## 2020-03-02 MED ORDER — METOPROLOL SUCCINATE ER 50 MG PO TB24
50.0000 mg | ORAL_TABLET | Freq: Every day | ORAL | 6 refills | Status: DC
Start: 2020-03-02 — End: 2020-03-19

## 2020-03-02 NOTE — Patient Instructions (Addendum)
Medication Instructions:  Your physician has recommended you make the following change in your medication:  1. STOP Metoprolol Tartrate (Lopressor) 2. START Metoprolol Succinate (Toprol) 25 mg once daily  *If you need a refill on your cardiac medications before your next appointment, please call your pharmacy*   Lab Work: None ordered   Testing/Procedures: None ordered   Follow-Up: At [facility], you and your health needs are our priority.  As part of our continuing mission to provide you with exceptional heart care, we have created designated Provider Care Teams.  These Care Teams include your primary Cardiologist (physician) and Advanced Practice Providers (APPs -  Physician Assistants and Nurse Practitioners) who all work together to provide you with the care you need, when you need it.  We recommend signing up for the patient portal called "MyChart".  Sign up information is provided on this After Visit Summary.  MyChart is used to connect with patients for Virtual Visits (Telemedicine).  Patients are able to view lab/test results, encounter notes, upcoming appointments, etc.  Non-urgent messages can be sent to your provider as well.   To learn more about what you can do with MyChart, go to [email or web address].    Your next appointment:   6 month(s)  The format for your next appointment:   In Person  Provider:   Allegra Lai, MD    Thank you for choosing [facility]!!   Trinidad Curet, RN [phone]   Other Instructions

## 2020-03-02 NOTE — Progress Notes (Signed)
Electrophysiology Office Note   Date:  03/02/2020   ID:  Rickey Farrier, DOB 21-Oct-1932, MRN [id number]  PCP:  Orpah Melter, MD  Cardiologist:  [place] Primary Electrophysiologist:  Berkeley Veldman Meredith Leeds, MD    Chief Complaint: Syncope   History of Present Illness: [name] is a 86 y.o. female who is being seen today for the evaluation of syncope at the request of Orpah Melter, MD. Presenting today for electrophysiology evaluation.  She has a history significant for hypertension, paroxysmal atrial fibrillation, and syncope.  She presented to the emergency room 12/12/2019 with an episode of syncope.  She woke up with palpitations and chest tightness.  She was walking to the bathroom and woke up on the floor.  After episode, she had generalized weakness and chest tightness.  Today, denies symptoms of palpitations, chest pain, shortness of breath, orthopnea, PND, lower extremity edema, claudication, dizziness, presyncope, syncope, bleeding, or neurologic sequela. The patient is tolerating medications without difficulties.  Since last being seen, she has continued to have episodes of near syncope.  Her symptom activator and the Linq monitor shows what appears to be sinus rhythm and sinus tachycardia.  She was put on metoprolol which greatly improved her symptoms and she has had no further episodes since starting her metoprolol.   Past Medical History:  Diagnosis Date  . Arm numbness left   . Back pain   . Bronchitis, chronic ([place])   . Depression   . Dizziness   . Fatigue   . Hypertension    mild  . Palpitations   . Skin cancer   . Stress   . Syncope    History of    Past Surgical History:  Procedure Laterality Date  . APPENDECTOMY    . CESAREAN SECTION  [phone]     Current Outpatient Medications  Medication Sig Dispense Refill  . acetaminophen (TYLENOL 8 HOUR) 650 MG CR tablet Take 1 tablet (650 mg total) by mouth every 8 (eight) hours as needed for pain.  20 tablet 0  . Ascorbic Acid (VITAMIN C) 100 MG tablet Take 100 mg by mouth daily.    . cholecalciferol (VITAMIN D3) 25 MCG (1000 UNIT) tablet Take 1,000 Units by mouth daily.    . famotidine (PEPCID) 20 MG tablet One after bfast and bedtime    . folic acid (FOLVITE) 1 MG tablet Take 1 mg by mouth daily.    Marland Kitchen HYDROcodone-acetaminophen (NORCO/VICODIN) 5-325 MG tablet Take 1 tablet by mouth every 4 (four) hours as needed for moderate pain. 40 tablet 0  . metoprolol tartrate (LOPRESSOR) 25 MG tablet Take 12.5 mg by mouth daily as needed (heart palpitations).     . Multiple Vitamins-Minerals (ONE-A-DAY WOMENS 50 PLUS PO) Take 1 tablet by mouth daily.     No current facility-administered medications for this visit.    Allergies:   Ciprofloxacin, Penicillins, Sulfonamide derivatives, Avelox [moxifloxacin], Reclast [zoledronic acid], and Chlorpheniramine   Social History:  The patient  reports that she has never smoked. She has never used smokeless tobacco. She reports current alcohol use. She reports that she does not use drugs.   Family History:  The patient's family history includes Alcohol abuse in her father; Atrial fibrillation in her mother; Thyroid disease in her mother.   ROS:  Please see the history of present illness.   Otherwise, review of systems is positive for none.   All other systems are reviewed and negative.   PHYSICAL EXAM: VS:  BP 124/82   Pulse  70   Ht 5\' 4"  (1.626 m)   Wt 123 lb (55.8 kg)   SpO2 96%   BMI 21.11 kg/m  , BMI Body mass index is 21.11 kg/m. GEN: Well nourished, well developed, in no acute distress  HEENT: normal  Neck: no JVD, carotid bruits, or masses Cardiac: RRR; no murmurs, rubs, or gallops,no edema  Respiratory:  clear to auscultation bilaterally, normal work of breathing GI: soft, nontender, nondistended, + BS MS: no deformity or atrophy  Skin: warm and dry, device site well healed Neuro:  Strength and sensation are intact Psych: euthymic mood,  full affect  EKG:  EKG is not ordered today. Personal review of the ekg ordered 12/15/19 shows sinus rhythm, rate 81  Personal review of the device interrogation today. Results in [facility]: 09/15/2019: TSH 1.950 12/12/2019: BUN 17; Creatinine, Ser 1.11; Hemoglobin 14.9; Platelets 247; Potassium 4.2; Sodium 140    Lipid Panel     Component Value Date/Time   CHOL 166 01/09/2011 0455   TRIG 69 01/09/2011 0455   HDL 55 01/09/2011 0455   CHOLHDL 3.0 01/09/2011 0455   VLDL 14 01/09/2011 0455   LDLCALC 97 01/09/2011 0455     Wt Readings from Last 3 Encounters:  03/02/20 123 lb (55.8 kg)  02/03/20 124 lb (56.2 kg)  01/27/20 123 lb 6.4 oz (56 kg)      Other studies Reviewed: Additional studies/ records that were reviewed today include: Cardiac monitor 10/23/2019 personally reviewed Review of the above records today demonstrates:   Sinus rhythm including sinus bradycardia ( predominately at night )  Episodes of PSVT lasting between 6-8 seconds.    ASSESSMENT AND PLAN:  1.  Paroxysmal atrial fibrillation: CHA2DS2-VASc of 4.  Not anticoagulated due to episode of syncope.  She was started on metoprolol which improved her symptoms.  She was having near syncopal episodes with device interrogation showing what appears to be sinus tachycardia.  We Kain Milosevic start her on Toprol-XL 25 mg that she had a great relief with short acting metoprolol.  2.  Syncope: Status post Linq monitor.  No further episodes  Current medicines are reviewed at length with the patient today.   The patient does not have concerns regarding her medicines.  The following changes were made today: Start Toprol-XL 25 mg  Labs/ tests ordered today include:  Orders Placed This Encounter  Procedures  . EKG 12-Lead     Disposition:   FU with Perlita Forbush 6 months  Signed, Desarai Barrack Meredith Leeds, MD  03/02/2020 4:40 PM     [address] [place]  [phone] (office) [phone] (fax)

## 2020-03-03 LAB — CUP PACEART REMOTE DEVICE CHECK
Date Time Interrogation Session: 20210927174949
Implantable Pulse Generator Implant Date: 20210824

## 2020-03-05 NOTE — Progress Notes (Signed)
Carelink Summary Report / Loop Recorder 

## 2020-03-15 DIAGNOSIS — S92355A Nondisplaced fracture of fifth metatarsal bone, left foot, initial encounter for closed fracture: Secondary | ICD-10-CM | POA: Diagnosis not present

## 2020-03-18 ENCOUNTER — Telehealth: Payer: Self-pay | Admitting: Cardiology

## 2020-03-18 NOTE — Telephone Encounter (Signed)
Pt c/o medication issue:  1. Name of Medication: Metoprolol Succinate  2. How are you currently taking this medication (dosage and times per day)? 1 time at night  3. Are you having a reaction (difficulty breathing--STAT)? no  4. What is your medication issue? Severe heart burn, severe headaches and can not sleep at night- she feels awful

## 2020-03-18 NOTE — Telephone Encounter (Addendum)
Since medication change pt reports she is experiencing insomnia and "tremendous indigestion".  She takes Pepcid but it only relieves issue for short time. Pt advised ok to stop Toprol and return to taking Lopressor BID - per Dr. Curt Bears. Pt agreeable to plan.  After hanging up I noticed she only took Lopressor PRN.   lmtcb to further discuss Lopressor

## 2020-03-19 ENCOUNTER — Ambulatory Visit: Payer: Medicare Other | Admitting: Cardiovascular Disease

## 2020-03-19 MED ORDER — METOPROLOL TARTRATE 25 MG PO TABS: 12.5000 mg | ORAL_TABLET | Freq: Two times a day (BID) | ORAL | 1 refills | Status: DC

## 2020-03-19 NOTE — Telephone Encounter (Signed)
Patient returning call.

## 2020-03-19 NOTE — Telephone Encounter (Signed)
Pt advised to start Lopressor 12.5 BID Pt is going to use supply she has on hand at home and see how she responds before filling another Rx. Pt agreeable and will call office is she experiences any SE after medication change.

## 2020-03-23 ENCOUNTER — Ambulatory Visit: Payer: Medicare Other | Admitting: Internal Medicine

## 2020-03-25 ENCOUNTER — Telehealth: Payer: Self-pay

## 2020-03-25 NOTE — Telephone Encounter (Signed)
ILR alert received 03/25/20 for 3 symptom events, all appear SR/ST 90-110 bpm, 1 tachycardia event, total of 12 seconds. Patient reports she has started taking Lopressor 12.5 mg BID and felt great improvement from side effects. Patient does reports of still having issues with "indigestion." Patient states during the symptom events she experienced what felt like "something sitting on my chest".  Denies shortness of breath.   Advised patient I will forward to Dr. Curt Bears for review and we will call if he has changes. Verbalized understanding.

## 2020-03-25 NOTE — Telephone Encounter (Signed)
Indigestion should be monitored by PCP.  Review of Linq monitor interrogation shows sinus rhythm at the time of symptoms.  Continue to monitor.

## 2020-03-26 NOTE — Telephone Encounter (Signed)
Patient called and updated plan per Dr. Curt Bears. Answered questions. Vebalized understanding and thanked me for the call.

## 2020-04-05 ENCOUNTER — Ambulatory Visit (INDEPENDENT_AMBULATORY_CARE_PROVIDER_SITE_OTHER): Payer: Medicare Other

## 2020-04-05 DIAGNOSIS — R55 Syncope and collapse: Secondary | ICD-10-CM | POA: Diagnosis not present

## 2020-04-05 LAB — CUP PACEART REMOTE DEVICE CHECK
Date Time Interrogation Session: 20211030174838
Implantable Pulse Generator Implant Date: 20210824

## 2020-04-07 ENCOUNTER — Telehealth: Payer: Self-pay

## 2020-04-07 NOTE — Telephone Encounter (Signed)
Repeat tachy alert received 04/06/20.   Patient reports she has missed some nighttime doses of lopressor. Patient encouraged to take medication as prescribed to reduce elevated rates. Verbalizes understanding.

## 2020-04-08 DIAGNOSIS — E042 Nontoxic multinodular goiter: Secondary | ICD-10-CM | POA: Diagnosis not present

## 2020-04-08 NOTE — Progress Notes (Signed)
Carelink Summary Report / Loop Recorder 

## 2020-04-13 ENCOUNTER — Other Ambulatory Visit: Payer: Self-pay | Admitting: Surgery

## 2020-04-13 DIAGNOSIS — D440 Neoplasm of uncertain behavior of thyroid gland: Secondary | ICD-10-CM

## 2020-04-13 DIAGNOSIS — E042 Nontoxic multinodular goiter: Secondary | ICD-10-CM

## 2020-04-14 ENCOUNTER — Telehealth: Payer: Self-pay

## 2020-04-14 ENCOUNTER — Ambulatory Visit
Admission: RE | Admit: 2020-04-14 | Discharge: 2020-04-14 | Disposition: A | Discharge status: Home / Self Care | Payer: Medicare Other | Source: Ambulatory Visit | Attending: Surgery | Admitting: Surgery

## 2020-04-14 DIAGNOSIS — E041 Nontoxic single thyroid nodule: Secondary | ICD-10-CM | POA: Diagnosis not present

## 2020-04-14 DIAGNOSIS — D440 Neoplasm of uncertain behavior of thyroid gland: Secondary | ICD-10-CM

## 2020-04-14 DIAGNOSIS — E042 Nontoxic multinodular goiter: Secondary | ICD-10-CM

## 2020-04-14 NOTE — Telephone Encounter (Signed)
Repeated alert for tachy event (possibly SVT 160's), duration 10 seconds. Patient reports she has been compliant with medications including making sure she takes her nighttime dose of lopressor. Patient reports of new symptoms. Advised I will forward to Dr. Curt Bears for review and we will call with any changes. Verbalized understanding.

## 2020-04-16 NOTE — Telephone Encounter (Signed)
Increase metoprolol to 25 mg BID.

## 2020-04-19 MED ORDER — METOPROLOL TARTRATE 25 MG PO TABS
25.0000 mg | ORAL_TABLET | Freq: Two times a day (BID) | ORAL | 3 refills | Status: DC
Start: 2020-04-19 — End: 2020-04-26

## 2020-04-19 NOTE — Telephone Encounter (Signed)
Called patient to advised to increase metoprolol to 25 BID. Verbalized understanding.

## 2020-04-26 ENCOUNTER — Telehealth: Payer: Self-pay | Admitting: Cardiology

## 2020-04-26 ENCOUNTER — Other Ambulatory Visit: Payer: Self-pay | Admitting: Cardiology

## 2020-04-26 MED ORDER — APIXABAN 5 MG PO TABS
5.0000 mg | ORAL_TABLET | Freq: Two times a day (BID) | ORAL | 5 refills | Status: DC
Start: 2020-04-26 — End: 2020-04-28

## 2020-04-26 MED ORDER — METOPROLOL TARTRATE 50 MG PO TABS: 50.0000 mg | ORAL_TABLET | Freq: Two times a day (BID) | ORAL | 3 refills | Status: DC

## 2020-04-26 NOTE — Telephone Encounter (Addendum)
Linq 2 transmission received due to symptom activation   Pt has known PAF, not on OAC due to history of syncopal episodes.  Current medications include Metoprolol 25mg  BID.    Spoke with Dr. Curt Bears, he recommends starting [facility]- Eliquis 5mg  BID if pt agreeable;  Increase Metoprolol to 50mg  BID and refer to AF clinic.    Spoke with pt.  Advised of MD recommendations.  Pt v/u of instructions to increased Metoprolol and referral to AF clinic.  She is hesitant but agreeable to [facility].  Rxs for metoprolol and Eliquis sent to pharmacy.    Advised pt of ED precautions- included new or worsening SOB, NV, syncope to call 911/ have someone drive her to ED.

## 2020-04-26 NOTE — Telephone Encounter (Signed)
  1. Has your device fired? no  2. Is you device beeping? no  3. Are you experiencing draining or swelling at device site? no  4. Are you calling to see if we received your device transmission? yes  5. Have you passed out? No     Please route to [place]

## 2020-04-26 NOTE — Telephone Encounter (Signed)
Called and left message for patient to call back, need to schedule appt for pt to be seen 04/28/20 for AF.

## 2020-04-26 NOTE — Telephone Encounter (Signed)
Patient c/o Palpitations:  High priority if patient c/o lightheadedness, shortness of breath, or chest pain  1) How long have you had palpitations/irregular HR/ Afib? Are you having the symptoms now?  Since 7 AM this morning   2) Are you currently experiencing lightheadedness, SOB or CP?  Lightheaded, pain around implant. Feels like someone is sitting on her from her throat to her chest. No SOB  3) Do you have a history of afib (atrial fibrillation) or irregular heart rhythm? Yes   4) Have you checked your BP or HR? (document readings if available):  Won't register, keeps coming up as an error   5) Are you experiencing any other symptoms?  Feels like something is sitting on her chest    1. Has your device fired? no  2. Is you device beeping? no  3. Are you experiencing draining or swelling at device site? no 4. Are you calling to see if we received your device transmission?  yes  5. Have you passed out? no   Please route to [place]

## 2020-04-27 ENCOUNTER — Telehealth: Payer: Self-pay

## 2020-04-27 NOTE — Telephone Encounter (Signed)
ILR symptom event recorded with onset on AF 04/26/20 @ 0807. Symptom event triggered at 0919. Histogram showing majority < 100 bpm. Presenting rhythm AF. Increased metoprolol 25 mg BID on 11//5/21.   Eliquis 5 mg BID, lopressor 50 mg BID.  Called to assess. No answer, LMOVM.

## 2020-04-27 NOTE — Telephone Encounter (Signed)
Patient returned my call.  She is aware of appt 04/28/20 at 2 pm.

## 2020-04-27 NOTE — Telephone Encounter (Signed)
Patient returned phone call. States she has not been able to get her medication filled (lopressor or Eliquis). States her son will be able to get it filled for her tomorrow. She is to weak to drive. Requested patient send manual transmission, states she is unable to d/t walking increases her shortness of breath walking up and down stairs. Patient also reports of it feels like something is sitting on her chest and in her throat. Presenting rhythm checked this morning, appears AF still, controlled VR.  Consulted with Dr. Curt Bears advised he would like for patient to have an evaluation in AF clinic and if she worsens she can go to the ED. Patient called and agreeable to plan. States AF clinic called her this morning she will call them back to make apt. For this week.

## 2020-04-27 NOTE — Telephone Encounter (Signed)
Called and left 2nd message for patient to call back.

## 2020-04-28 ENCOUNTER — Encounter (HOSPITAL_COMMUNITY): Payer: Self-pay | Admitting: Nurse Practitioner

## 2020-04-28 ENCOUNTER — Other Ambulatory Visit: Payer: Self-pay

## 2020-04-28 ENCOUNTER — Ambulatory Visit (HOSPITAL_COMMUNITY)
Admission: RE | Admit: 2020-04-28 | Discharge: 2020-04-28 | Disposition: A | Discharge status: Home / Self Care | Payer: Medicare Other | Source: Ambulatory Visit | Attending: Nurse Practitioner | Admitting: Nurse Practitioner

## 2020-04-28 VITALS — BP 156/66 | HR 71 | Ht 64.0 in | Wt 126.4 lb

## 2020-04-28 DIAGNOSIS — I480 Paroxysmal atrial fibrillation: Secondary | ICD-10-CM

## 2020-04-28 DIAGNOSIS — Z881 Allergy status to other antibiotic agents status: Secondary | ICD-10-CM | POA: Diagnosis not present

## 2020-04-28 DIAGNOSIS — Z880 Allergy status to penicillin: Secondary | ICD-10-CM | POA: Diagnosis not present

## 2020-04-28 DIAGNOSIS — I4891 Unspecified atrial fibrillation: Secondary | ICD-10-CM | POA: Insufficient documentation

## 2020-04-28 DIAGNOSIS — Z888 Allergy status to other drugs, medicaments and biological substances status: Secondary | ICD-10-CM | POA: Insufficient documentation

## 2020-04-28 DIAGNOSIS — Z8249 Family history of ischemic heart disease and other diseases of the circulatory system: Secondary | ICD-10-CM | POA: Diagnosis not present

## 2020-04-28 DIAGNOSIS — Z79899 Other long term (current) drug therapy: Secondary | ICD-10-CM | POA: Insufficient documentation

## 2020-04-28 DIAGNOSIS — D6869 Other thrombophilia: Secondary | ICD-10-CM

## 2020-04-28 MED ORDER — METOPROLOL TARTRATE 25 MG PO TABS: 37.5000 mg | ORAL_TABLET | Freq: Two times a day (BID) | ORAL | 3 refills | Status: DC

## 2020-04-28 MED ORDER — APIXABAN 2.5 MG PO TABS
2.5000 mg | ORAL_TABLET | Freq: Two times a day (BID) | ORAL | 3 refills | Status: DC
Start: 2020-04-28 — End: 2020-09-10

## 2020-04-28 NOTE — Patient Instructions (Signed)
Start Eliquis 2.5mg  twice a day  Increase metoprolol to 37.5mg  twice a day (1 and 1/2 tabs of your 25mg  tab)

## 2020-04-28 NOTE — Progress Notes (Signed)
Primary Care Physician: Orpah Melter, MD Referring Physician: Dr. [facility]   [name] is a 86 y.o. female with a h/o new dx of atrial fibrillation recently found on her Linq. She  is in SR today, but high burden on Monday and Tuesday. She has been on metoprolol 12.5 mg bid then 25 mg bid for the last week and was told last to increase to 50 mg bid. She  is very  Concerned re that jump in dose. She  has not started the eliquis, benefit vtrs risk explained. She  will need the 2.5 mg bid for her age over 89 yo and her weight of less than  60 kg. She was very weak with afib earlier in the week.    Today, she denies symptoms of palpitations, chest pain, shortness of breath, orthopnea, PND, lower extremity edema, dizziness, presyncope, syncope, or neurologic sequela. The patient is tolerating medications without difficulties and is otherwise without complaint today.   Past Medical History:  Diagnosis Date  . Arm numbness left   . Back pain   . Bronchitis, chronic ([place])   . Depression   . Dizziness   . Fatigue   . Hypertension    mild  . Palpitations   . Skin cancer   . Stress   . Syncope    History of    Past Surgical History:  Procedure Laterality Date  . APPENDECTOMY    . CESAREAN SECTION  [phone]    Current Outpatient Medications  Medication Sig Dispense Refill  . Ascorbic Acid (VITAMIN C) 100 MG tablet Take 100 mg by mouth daily.    . cholecalciferol (VITAMIN D3) 25 MCG (1000 UNIT) tablet Take 1,000 Units by mouth daily.    . famotidine (PEPCID) 20 MG tablet One after bfast and bedtime (Patient taking differently: as needed. One after bfast and bedtime)    . folic acid (FOLVITE) 1 MG tablet Take 1 mg by mouth daily.    Marland Kitchen HYDROcodone-acetaminophen (NORCO/VICODIN) 5-325 MG tablet Take 1 tablet by mouth every 4 (four) hours as needed for moderate pain. (Patient taking differently: Take 1 tablet by mouth at bedtime. ) 40 tablet 0  . metoprolol tartrate  (LOPRESSOR) 25 MG tablet Take 1.5 tablets (37.5 mg total) by mouth 2 (two) times daily. 180 tablet 3  . Multiple Vitamins-Minerals (ONE-A-DAY WOMENS 50 PLUS PO) Take 1 tablet by mouth daily.    Marland Kitchen acetaminophen (TYLENOL 8 HOUR) 650 MG CR tablet Take 1 tablet (650 mg total) by mouth every 8 (eight) hours as needed for pain. (Patient not taking: Reported on 04/28/2020) 20 tablet 0  . apixaban (ELIQUIS) 2.5 MG TABS tablet Take 1 tablet (2.5 mg total) by mouth 2 (two) times daily. 60 tablet 3   No current facility-administered medications for this encounter.    Allergies  Allergen Reactions  . Ciprofloxacin Other (See Comments)    triggered a migraine that was thought to be a TIA  . Penicillins Rash    Has patient had a PCN reaction causing immediate rash, facial/tongue/throat swelling, SOB or lightheadedness with hypotension: Yes Has patient had a PCN reaction causing severe rash involving mucus membranes or skin necrosis: No Has patient had a PCN reaction that required hospitalization: No Has patient had a PCN reaction occurring within the last 10 years: No If all of the above answers are "NO", then may proceed with Cephalosporin use.  . Sulfonamide Derivatives Swelling  . Avelox [Moxifloxacin] Other (See Comments)    Rash  and severe tingling/numbness   . Reclast [Zoledronic Acid] Other (See Comments)    hypertension  . Chlorpheniramine Other (See Comments)    Headache, stomach ache, bladder "not working well"    Social History   Socioeconomic History  . Marital status: Married    Spouse name: Not on file  . Number of children: Not on file  . Years of education: Not on file  . Highest education level: Not on file  Occupational History  . Not on file  Tobacco Use  . Smoking status: Never Smoker  . Smokeless tobacco: Never Used  Vaping Use  . Vaping Use: Never used  Substance and Sexual Activity  . Alcohol use: Yes    Comment: social ETOH  . Drug use: No  . Sexual activity:  Not on file  Other Topics Concern  . Not on file  Social History Narrative  . Not on file   Social Determinants of Health   Financial Resource Strain:   . Difficulty of Paying Living Expenses: Not on file  Food Insecurity:   . Worried About Charity fundraiser in the Last Year: Not on file  . Ran Out of Food in the Last Year: Not on file  Transportation Needs:   . Lack of Transportation (Medical): Not on file  . Lack of Transportation (Non-Medical): Not on file  Physical Activity:   . Days of Exercise per Week: Not on file  . Minutes of Exercise per Session: Not on file  Stress:   . Feeling of Stress : Not on file  Social Connections:   . Frequency of Communication with Friends and Family: Not on file  . Frequency of Social Gatherings with Friends and Family: Not on file  . Attends Religious Services: Not on file  . Active Member of Clubs or Organizations: Not on file  . Attends Archivist Meetings: Not on file  . Marital Status: Not on file  Intimate Partner Violence:   . Fear of Current or Ex-Partner: Not on file  . Emotionally Abused: Not on file  . Physically Abused: Not on file  . Sexually Abused: Not on file    Family History  Problem Relation Age of Onset  . Atrial fibrillation Mother   . Thyroid disease Mother   . Alcohol abuse Father     ROS- All systems are reviewed and negative except as per the HPI above  Physical Exam: Vitals:   04/28/20 1400  BP: (!) 156/66  Pulse: 71  Weight: 57.3 kg  Height: 5\' 4"  (1.626 m)   Wt Readings from Last 3 Encounters:  04/28/20 57.3 kg  03/02/20 55.8 kg  02/03/20 56.2 kg    Labs: Lab Results  Component Value Date   NA 140 12/12/2019   K 4.2 12/12/2019   CL 107 12/12/2019   CO2 24 12/12/2019   GLUCOSE 138 (H) 12/12/2019   BUN 17 12/12/2019   CREATININE 1.11 (H) 12/12/2019   CALCIUM 9.2 12/12/2019   Lab Results  Component Value Date   INR 1.04 04/19/2018   Lab Results  Component Value Date    CHOL 166 01/09/2011   HDL 55 01/09/2011   LDLCALC 97 01/09/2011   TRIG 69 01/09/2011     GEN- The patient is well appearing, alert and oriented x 3 today.   Head- normocephalic, atraumatic Eyes-  Sclera clear, conjunctiva pink Ears- hearing intact Oropharynx- clear Neck- supple, no JVP Lymph- no cervical lymphadenopathy Lungs- Clear to ausculation bilaterally, normal  work of breathing Heart- Regular rate and rhythm, no murmurs, rubs or gallops, PMI not laterally displaced GI- soft, NT, ND, + BS Extremities- no clubbing, cyanosis, or edema MS- no significant deformity or atrophy Skin- no rash or lesion Psych- euthymic mood, full affect Neuro- strength and sensation are intact  EKG-NSR at 71 bpm, pr int 198 ms, qrs int 92 ms, qtc 406 ms     Assessment and Plan: 1. New onset afib In SR today  Discussion  re afib, management and triggers Increase metoprolol to 37.5 mg bid, she is anxious to go to 50 mg currenlty    2. CHA2DS2VASc score of at least 4 Risk vrs benefit of anticoagulation  explained to pt  Will rx 2.5 mg bid based on weight less than 130 lbs and age over 68 Advised to leave the 5 mg at the drugstore as well as the 50 mg metoprolol  Bleeding precautions discussed  Denies bleeding issues   I will see f/u in afib clinic in 2-3 weeks   Butch Penny C. Alaa Eyerman, [facility] [address] [place], [place] [address] [phone]

## 2020-05-04 ENCOUNTER — Ambulatory Visit: Payer: Medicare Other | Admitting: Internal Medicine

## 2020-05-06 ENCOUNTER — Encounter (HOSPITAL_COMMUNITY): Payer: Self-pay | Admitting: Nurse Practitioner

## 2020-05-06 ENCOUNTER — Other Ambulatory Visit: Payer: Self-pay

## 2020-05-06 ENCOUNTER — Ambulatory Visit (HOSPITAL_COMMUNITY)
Admission: RE | Admit: 2020-05-06 | Discharge: 2020-05-06 | Disposition: A | Discharge status: Home / Self Care | Payer: Medicare Other | Source: Ambulatory Visit | Attending: Nurse Practitioner | Admitting: Nurse Practitioner

## 2020-05-06 VITALS — BP 180/78 | HR 69 | Ht 64.0 in | Wt 127.0 lb

## 2020-05-06 DIAGNOSIS — I480 Paroxysmal atrial fibrillation: Secondary | ICD-10-CM | POA: Diagnosis not present

## 2020-05-06 DIAGNOSIS — D6869 Other thrombophilia: Secondary | ICD-10-CM | POA: Diagnosis not present

## 2020-05-06 DIAGNOSIS — I4891 Unspecified atrial fibrillation: Secondary | ICD-10-CM | POA: Insufficient documentation

## 2020-05-06 DIAGNOSIS — Z7901 Long term (current) use of anticoagulants: Secondary | ICD-10-CM | POA: Diagnosis not present

## 2020-05-06 DIAGNOSIS — I4819 Other persistent atrial fibrillation: Secondary | ICD-10-CM | POA: Diagnosis not present

## 2020-05-06 DIAGNOSIS — Z79899 Other long term (current) drug therapy: Secondary | ICD-10-CM | POA: Insufficient documentation

## 2020-05-06 MED ORDER — DILTIAZEM HCL ER COATED BEADS 120 MG PO CP24
120.0000 mg | ORAL_CAPSULE | Freq: Every day | ORAL | 3 refills | Status: DC
Start: 2020-05-06 — End: 2020-07-05

## 2020-05-06 NOTE — Patient Instructions (Signed)
Stop metoprolol  Start Cardizem 120mg  once a day  No aspirin  If medication for pain needed - recommend tylenol.

## 2020-05-06 NOTE — Progress Notes (Signed)
Primary Care Physician: Orpah Melter, MD Referring Physician: Dr. [facility]   [name] is a 86 y.o. female with a h/o new dx of atrial fibrillation recently found on her Linq. She  is in SR today, but high burden on Monday and Tuesday. She has been on metoprolol 12.5 mg bid then 25 mg bid for the last week and was told last to increase to 50 mg bid. She  is very  Concerned re that jump in dose. She  has not started the eliquis, benefit vtrs risk explained. She  will need the 2.5 mg bid for her age over 60 yo and her weight of less than  60 kg. She was very weak with afib earlier in the week.    F/u in afib clinic, 05/06/20. She is in SR, but does not like  the way she feels on BB, not sleeping, abdominal pains.  She has started  taking 2 baby ASA daily, I question if she started the  eliquis as she states that she is fearful of anticoagulation. Her BP is elevated today but at home runs 465 systolic.   Today, she denies symptoms of palpitations, chest pain, shortness of breath, orthopnea, PND, lower extremity edema, dizziness, presyncope, syncope, or neurologic sequela. The patient is tolerating medications without difficulties and is otherwise without complaint today.   Past Medical History:  Diagnosis Date  . Arm numbness left   . Back pain   . Bronchitis, chronic ([place])   . Depression   . Dizziness   . Fatigue   . Hypertension    mild  . Palpitations   . Skin cancer   . Stress   . Syncope    History of    Past Surgical History:  Procedure Laterality Date  . APPENDECTOMY    . CESAREAN SECTION  [phone]    Current Outpatient Medications  Medication Sig Dispense Refill  . apixaban (ELIQUIS) 2.5 MG TABS tablet Take 1 tablet (2.5 mg total) by mouth 2 (two) times daily. 60 tablet 3  . Ascorbic Acid (VITAMIN C) 100 MG tablet Take 100 mg by mouth daily.    . cholecalciferol (VITAMIN D3) 25 MCG (1000 UNIT) tablet Take 1,000 Units by mouth daily.    .  famotidine (PEPCID) 20 MG tablet One after bfast and bedtime (Patient taking differently: as needed. One after bfast and bedtime)    . folic acid (FOLVITE) 1 MG tablet Take 1 mg by mouth daily.    Marland Kitchen HYDROcodone-acetaminophen (NORCO/VICODIN) 5-325 MG tablet Take 1 tablet by mouth every 4 (four) hours as needed for moderate pain. (Patient taking differently: Take 1 tablet by mouth at bedtime. ) 40 tablet 0  . metoprolol tartrate (LOPRESSOR) 25 MG tablet Take 1.5 tablets (37.5 mg total) by mouth 2 (two) times daily. 180 tablet 3  . Multiple Vitamins-Minerals (ONE-A-DAY WOMENS 50 PLUS PO) Take 1 tablet by mouth daily.    Marland Kitchen acetaminophen (TYLENOL 8 HOUR) 650 MG CR tablet Take 1 tablet (650 mg total) by mouth every 8 (eight) hours as needed for pain. (Patient not taking: Reported on 04/28/2020) 20 tablet 0   No current facility-administered medications for this encounter.    Allergies  Allergen Reactions  . Ciprofloxacin Other (See Comments)    triggered a migraine that was thought to be a TIA  . Penicillins Rash    Has patient had a PCN reaction causing immediate rash, facial/tongue/throat swelling, SOB or lightheadedness with hypotension: Yes Has patient had a PCN  reaction causing severe rash involving mucus membranes or skin necrosis: No Has patient had a PCN reaction that required hospitalization: No Has patient had a PCN reaction occurring within the last 10 years: No If all of the above answers are "NO", then may proceed with Cephalosporin use.  . Sulfonamide Derivatives Swelling  . Avelox [Moxifloxacin] Other (See Comments)    Rash and severe tingling/numbness   . Reclast [Zoledronic Acid] Other (See Comments)    hypertension  . Chlorpheniramine Other (See Comments)    Headache, stomach ache, bladder "not working well"    Social History   Socioeconomic History  . Marital status: Married    Spouse name: Not on file  . Number of children: Not on file  . Years of education: Not on  file  . Highest education level: Not on file  Occupational History  . Not on file  Tobacco Use  . Smoking status: Never Smoker  . Smokeless tobacco: Never Used  Vaping Use  . Vaping Use: Never used  Substance and Sexual Activity  . Alcohol use: Yes    Comment: social ETOH  . Drug use: No  . Sexual activity: Not on file  Other Topics Concern  . Not on file  Social History Narrative  . Not on file   Social Determinants of Health   Financial Resource Strain:   . Difficulty of Paying Living Expenses: Not on file  Food Insecurity:   . Worried About Charity fundraiser in the Last Year: Not on file  . Ran Out of Food in the Last Year: Not on file  Transportation Needs:   . Lack of Transportation (Medical): Not on file  . Lack of Transportation (Non-Medical): Not on file  Physical Activity:   . Days of Exercise per Week: Not on file  . Minutes of Exercise per Session: Not on file  Stress:   . Feeling of Stress : Not on file  Social Connections:   . Frequency of Communication with Friends and Family: Not on file  . Frequency of Social Gatherings with Friends and Family: Not on file  . Attends Religious Services: Not on file  . Active Member of Clubs or Organizations: Not on file  . Attends Archivist Meetings: Not on file  . Marital Status: Not on file  Intimate Partner Violence:   . Fear of Current or Ex-Partner: Not on file  . Emotionally Abused: Not on file  . Physically Abused: Not on file  . Sexually Abused: Not on file    Family History  Problem Relation Age of Onset  . Atrial fibrillation Mother   . Thyroid disease Mother   . Alcohol abuse Father     ROS- All systems are reviewed and negative except as per the HPI above  Physical Exam: Vitals:   05/06/20 1458  BP: (!) 180/78  Weight: 57.6 kg  Height: 5\' 4"  (1.626 m)   Wt Readings from Last 3 Encounters:  05/06/20 57.6 kg  04/28/20 57.3 kg  03/02/20 55.8 kg    Labs: Lab Results   Component Value Date   NA 140 12/12/2019   K 4.2 12/12/2019   CL 107 12/12/2019   CO2 24 12/12/2019   GLUCOSE 138 (H) 12/12/2019   BUN 17 12/12/2019   CREATININE 1.11 (H) 12/12/2019   CALCIUM 9.2 12/12/2019   Lab Results  Component Value Date   INR 1.04 04/19/2018   Lab Results  Component Value Date   CHOL 166 01/09/2011  HDL 55 01/09/2011   LDLCALC 97 01/09/2011   TRIG 69 01/09/2011     GEN- The patient is well appearing, alert and oriented x 3 today.   Head- normocephalic, atraumatic Eyes-  Sclera clear, conjunctiva pink Ears- hearing intact Oropharynx- clear Neck- supple, no JVP Lymph- no cervical lymphadenopathy Lungs- Clear to ausculation bilaterally, normal work of breathing Heart- Regular rate and rhythm, no murmurs, rubs or gallops, PMI not laterally displaced GI- soft, NT, ND, + BS Extremities- no clubbing, cyanosis, or edema MS- no significant deformity or atrophy Skin- no rash or lesion Psych- euthymic mood, full affect Neuro- strength and sensation are intact  EKG-NSR at 69 bpm, pr int 212 ms, qrs int 88 ms, qtc 411 ms     Assessment and Plan: 1. New onset afib In SR today  Discussion re afib, management and triggers She does not feel well on BB, will stop metoprolol and start cardizem 120 mg qd  Echo ordered     2. CHA2DS2VASc score of at least 4 Risk vrs benefit of anticoagulation  explained to pt  Encouraged pt to take eliquis  2.5 mg bid to   prevent stroke and not to take asa    I will see f/u in afib clinic in 2-3 weeks   Butch Penny C. Chaye Misch, [facility] [address] [place], [place] [address] [phone]

## 2020-05-08 LAB — CUP PACEART REMOTE DEVICE CHECK
Date Time Interrogation Session: 20211202175016
Implantable Pulse Generator Implant Date: 20210824

## 2020-05-10 ENCOUNTER — Ambulatory Visit (INDEPENDENT_AMBULATORY_CARE_PROVIDER_SITE_OTHER): Payer: Medicare Other

## 2020-05-10 DIAGNOSIS — R55 Syncope and collapse: Secondary | ICD-10-CM

## 2020-05-13 ENCOUNTER — Telehealth: Payer: Self-pay | Admitting: Cardiology

## 2020-05-13 NOTE — Telephone Encounter (Signed)
Patient c/o Palpitations:  High priority if patient c/o lightheadedness, shortness of breath, or chest pain  1) How long have you had palpitations/irregular HR/ Afib? Are you having the symptoms now? Patient states she is and has been experiencing palpitations since 4:30 AM.  2) Are you currently experiencing lightheadedness, SOB or CP? No   3) Do you have a history of afib (atrial fibrillation) or irregular heart rhythm? yes  4) Have you checked your BP or HR? (document readings if available): No   5) Are you experiencing any other symptoms? Weakness in legs

## 2020-05-13 NOTE — Telephone Encounter (Signed)
Spoke with patient she woke up at 430 just not "feeling right - like her heart was pounding and going haywire". She sent a transmission through her linq which was normal rhythm at time of symptoms. Reassured pt her HR did increase near 100 but no afib was noted. Pt calmed down after learning no afib this morning.  She denies weight gain or swelling.  She doesn't have a way to check her BP. She did go ahead and take a extra 120mg  of cardizem this morning (she takes at night).  States the eliquis gives her a headache 1 hour after taking - offered to change to xarelto to see if she tolerates it better she will think about it. Xarelto 15mg  daily would be her dosage.

## 2020-05-13 NOTE — Telephone Encounter (Signed)
Will route to Dr. Curt Bears' nurse as Juluis Rainier

## 2020-05-20 ENCOUNTER — Ambulatory Visit (HOSPITAL_BASED_OUTPATIENT_CLINIC_OR_DEPARTMENT_OTHER)
Admission: RE | Admit: 2020-05-20 | Discharge: 2020-05-20 | Disposition: A | Discharge status: Home / Self Care | Payer: Medicare Other | Source: Ambulatory Visit | Attending: Nurse Practitioner | Admitting: Nurse Practitioner

## 2020-05-20 ENCOUNTER — Other Ambulatory Visit: Payer: Self-pay

## 2020-05-20 ENCOUNTER — Encounter (HOSPITAL_COMMUNITY): Payer: Self-pay | Admitting: Nurse Practitioner

## 2020-05-20 ENCOUNTER — Ambulatory Visit (HOSPITAL_COMMUNITY)
Admission: RE | Admit: 2020-05-20 | Discharge: 2020-05-20 | Disposition: A | Discharge status: Home / Self Care | Payer: Medicare Other | Source: Ambulatory Visit | Attending: Nurse Practitioner | Admitting: Nurse Practitioner

## 2020-05-20 VITALS — BP 160/76 | HR 83 | Ht 64.0 in | Wt 125.4 lb

## 2020-05-20 DIAGNOSIS — I4819 Other persistent atrial fibrillation: Secondary | ICD-10-CM | POA: Insufficient documentation

## 2020-05-20 DIAGNOSIS — Z79899 Other long term (current) drug therapy: Secondary | ICD-10-CM | POA: Diagnosis not present

## 2020-05-20 DIAGNOSIS — I480 Paroxysmal atrial fibrillation: Secondary | ICD-10-CM | POA: Diagnosis not present

## 2020-05-20 DIAGNOSIS — Z7901 Long term (current) use of anticoagulants: Secondary | ICD-10-CM | POA: Diagnosis not present

## 2020-05-20 DIAGNOSIS — I351 Nonrheumatic aortic (valve) insufficiency: Secondary | ICD-10-CM

## 2020-05-20 DIAGNOSIS — D6869 Other thrombophilia: Secondary | ICD-10-CM

## 2020-05-20 LAB — ECHOCARDIOGRAM COMPLETE
Area-P 1/2: 3.27 cm2
P 1/2 time: 407 msec
S' Lateral: 2.4 cm

## 2020-05-20 NOTE — Progress Notes (Signed)
Primary Care Physician: Orpah Melter, MD Referring Physician: Dr. [facility]   [name] is a 86 y.o. female with a h/o new dx of atrial fibrillation recently found on her Linq. She  is in SR today, but high burden on Monday and Tuesday. She has been on metoprolol 12.5 mg bid then 25 mg bid for the last week and was told last to increase to 50 mg bid. She  is very  Concerned re that jump in dose. She  has not started the eliquis, benefit vtrs risk explained. She  will need the 2.5 mg bid for her age over 38 yo and her weight of less than  60 kg. She was very weak with afib earlier in the week.    F/u in afib clinic, 05/06/20. She is in SR, but does not like  the way she feels on BB, not sleeping, abdominal pains.  She has started  taking 2 baby ASA daily, I question if she started the  eliquis as she states that she is fearful of anticoagulation. Her BP is elevated today but at home runs 017 systolic.   F/u in afib clinic, 05/20/20. She is in SR. She  still does not like the blood thinner, feels it is making her face feel tight and itchy and intermittent H/A. I switched her to Cardizem from  BB as she felt  it made her feel weak. She still feels weak, maybe less so than with BB. She is unhappy with  Linq  insert, "hurts all the time."   Today, she denies symptoms of palpitations, chest pain, shortness of breath, orthopnea, PND, lower extremity edema, dizziness, presyncope, syncope, or neurologic sequela. The patient is tolerating medications without difficulties and is otherwise without complaint today.   Past Medical History:  Diagnosis Date  . Arm numbness left   . Back pain   . Bronchitis, chronic ([place])   . Depression   . Dizziness   . Fatigue   . Hypertension    mild  . Palpitations   . Skin cancer   . Stress   . Syncope    History of    Past Surgical History:  Procedure Laterality Date  . APPENDECTOMY    . CESAREAN SECTION  [phone]    Current  Outpatient Medications  Medication Sig Dispense Refill  . apixaban (ELIQUIS) 2.5 MG TABS tablet Take 1 tablet (2.5 mg total) by mouth 2 (two) times daily. 60 tablet 3  . Ascorbic Acid (VITAMIN C) 100 MG tablet Take 100 mg by mouth daily.    . cholecalciferol (VITAMIN D3) 25 MCG (1000 UNIT) tablet Take 1,000 Units by mouth daily.    Marland Kitchen diltiazem (CARDIZEM CD) 120 MG 24 hr capsule Take 1 capsule (120 mg total) by mouth daily. 30 capsule 3  . famotidine (PEPCID) 20 MG tablet One after bfast and bedtime (Patient taking differently: as needed. One after bfast and bedtime)    . folic acid (FOLVITE) 1 MG tablet Take 1 mg by mouth daily.    Marland Kitchen HYDROcodone-acetaminophen (NORCO/VICODIN) 5-325 MG tablet Take 1 tablet by mouth every 4 (four) hours as needed for moderate pain. (Patient taking differently: Take 1 tablet by mouth at bedtime.) 40 tablet 0  . Multiple Vitamins-Minerals (ONE-A-DAY WOMENS 50 PLUS PO) Take 1 tablet by mouth daily.    Marland Kitchen acetaminophen (TYLENOL 8 HOUR) 650 MG CR tablet Take 1 tablet (650 mg total) by mouth every 8 (eight) hours as needed for pain. (Patient not taking:  Reported on 05/20/2020) 20 tablet 0   No current facility-administered medications for this encounter.    Allergies  Allergen Reactions  . Ciprofloxacin Other (See Comments)    triggered a migraine that was thought to be a TIA  . Penicillins Rash    Has patient had a PCN reaction causing immediate rash, facial/tongue/throat swelling, SOB or lightheadedness with hypotension: Yes Has patient had a PCN reaction causing severe rash involving mucus membranes or skin necrosis: No Has patient had a PCN reaction that required hospitalization: No Has patient had a PCN reaction occurring within the last 10 years: No If all of the above answers are "NO", then may proceed with Cephalosporin use.  . Sulfonamide Derivatives Swelling  . Avelox [Moxifloxacin] Other (See Comments)    Rash and severe tingling/numbness   . Reclast  [Zoledronic Acid] Other (See Comments)    hypertension  . Chlorpheniramine Other (See Comments)    Headache, stomach ache, bladder "not working well"    Social History   Socioeconomic History  . Marital status: Married    Spouse name: Not on file  . Number of children: Not on file  . Years of education: Not on file  . Highest education level: Not on file  Occupational History  . Not on file  Tobacco Use  . Smoking status: Never Smoker  . Smokeless tobacco: Never Used  Vaping Use  . Vaping Use: Never used  Substance and Sexual Activity  . Alcohol use: Yes    Comment: social ETOH  . Drug use: No  . Sexual activity: Not on file  Other Topics Concern  . Not on file  Social History Narrative  . Not on file   Social Determinants of Health   Financial Resource Strain: Not on file  Food Insecurity: Not on file  Transportation Needs: Not on file  Physical Activity: Not on file  Stress: Not on file  Social Connections: Not on file  Intimate Partner Violence: Not on file    Family History  Problem Relation Age of Onset  . Atrial fibrillation Mother   . Thyroid disease Mother   . Alcohol abuse Father     ROS- All systems are reviewed and negative except as per the HPI above  Physical Exam: Vitals:   05/20/20 1409  BP: (!) 160/76  Pulse: 83  Weight: 56.9 kg  Height: 5\' 4"  (1.626 m)   Wt Readings from Last 3 Encounters:  05/20/20 56.9 kg  05/06/20 57.6 kg  04/28/20 57.3 kg    Labs: Lab Results  Component Value Date   NA 140 12/12/2019   K 4.2 12/12/2019   CL 107 12/12/2019   CO2 24 12/12/2019   GLUCOSE 138 (H) 12/12/2019   BUN 17 12/12/2019   CREATININE 1.11 (H) 12/12/2019   CALCIUM 9.2 12/12/2019   Lab Results  Component Value Date   INR 1.04 04/19/2018   Lab Results  Component Value Date   CHOL 166 01/09/2011   HDL 55 01/09/2011   LDLCALC 97 01/09/2011   TRIG 69 01/09/2011     GEN- The patient is well appearing, alert and oriented x 3  today.   Head- normocephalic, atraumatic Eyes-  Sclera clear, conjunctiva pink Ears- hearing intact Oropharynx- clear Neck- supple, no JVP Lymph- no cervical lymphadenopathy Lungs- Clear to ausculation bilaterally, normal work of breathing Heart- Regular rate and rhythm, no murmurs, rubs or gallops, PMI not laterally displaced GI- soft, NT, ND, + BS Extremities- no clubbing, cyanosis, or edema  MS- no significant deformity or atrophy Skin- no rash or lesion Psych- euthymic mood, full affect Neuro- strength and sensation are intact  EKG-NSR at 83 bpm, pr int 194 ms, qrs int 90 ms, qtc 425 ms    Assessment and Plan: 1. New onset afib In SR today  Discussion re afib, management and triggers She did  not feel well on BB, stopped metoprolol and I started cardizem 120 mg qd, however, she still feels weak, somewhat improved?  Echo pending from earlier today      2. CHA2DS2VASc score of at least 4 Eliquis  2.5 mg bid   She does not feel like she is tolerating with intermittent H/a itchy face   I offered her xarelto or coumadin but she would like to give eliquis more time to adjust to   I will bring back in 2 weeks,  bmet/cbc at that time   Butch Penny C. Teruko Joswick, [facility] [address] [place], [place] [address] [phone]

## 2020-05-20 NOTE — Progress Notes (Signed)
Carelink Summary Report / Loop Recorder 

## 2020-05-20 NOTE — Progress Notes (Signed)
  Echocardiogram 2D Echocardiogram has been performed.  Jennette Leask G Katharin Schneider 05/20/2020, 2:05 PM

## 2020-06-03 ENCOUNTER — Encounter (HOSPITAL_COMMUNITY): Payer: Self-pay | Admitting: Nurse Practitioner

## 2020-06-03 ENCOUNTER — Ambulatory Visit (HOSPITAL_COMMUNITY)
Admission: RE | Admit: 2020-06-03 | Discharge: 2020-06-03 | Disposition: A | Discharge status: Home / Self Care | Payer: Medicare Other | Source: Ambulatory Visit | Attending: Nurse Practitioner | Admitting: Nurse Practitioner

## 2020-06-03 ENCOUNTER — Other Ambulatory Visit: Payer: Self-pay

## 2020-06-03 VITALS — BP 164/76 | HR 84 | Ht 64.0 in | Wt 126.4 lb

## 2020-06-03 DIAGNOSIS — Z880 Allergy status to penicillin: Secondary | ICD-10-CM | POA: Insufficient documentation

## 2020-06-03 DIAGNOSIS — Z881 Allergy status to other antibiotic agents status: Secondary | ICD-10-CM | POA: Insufficient documentation

## 2020-06-03 DIAGNOSIS — Z7901 Long term (current) use of anticoagulants: Secondary | ICD-10-CM | POA: Diagnosis not present

## 2020-06-03 DIAGNOSIS — I480 Paroxysmal atrial fibrillation: Secondary | ICD-10-CM

## 2020-06-03 DIAGNOSIS — D6869 Other thrombophilia: Secondary | ICD-10-CM | POA: Diagnosis not present

## 2020-06-03 DIAGNOSIS — I4891 Unspecified atrial fibrillation: Secondary | ICD-10-CM | POA: Diagnosis not present

## 2020-06-03 DIAGNOSIS — Z888 Allergy status to other drugs, medicaments and biological substances status: Secondary | ICD-10-CM | POA: Diagnosis not present

## 2020-06-03 DIAGNOSIS — Z79899 Other long term (current) drug therapy: Secondary | ICD-10-CM | POA: Diagnosis not present

## 2020-06-03 LAB — BASIC METABOLIC PANEL
Anion gap: 7 (ref 5–15)
BUN: 20 mg/dL (ref 8–23)
CO2: 26 mmol/L (ref 22–32)
Calcium: 9.1 mg/dL (ref 8.9–10.3)
Chloride: 106 mmol/L (ref 98–111)
Creatinine, Ser: 1.19 mg/dL — ABNORMAL HIGH (ref 0.44–1.00)
GFR, Estimated: 45 mL/min — ABNORMAL LOW (ref >60)
Glucose, Bld: 95 mg/dL (ref 70–99)
Potassium: 4.7 mmol/L (ref 3.5–5.1)
Sodium: 139 mmol/L (ref 135–145)

## 2020-06-03 LAB — CBC
HCT: 43.2 % (ref 36.0–46.0)
Hemoglobin: 14.1 g/dL (ref 12.0–15.0)
MCH: 30.8 pg (ref 26.0–34.0)
MCHC: 32.6 g/dL (ref 30.0–36.0)
MCV: 94.3 fL (ref 80.0–100.0)
Platelets: 247 10*3/uL (ref 150–400)
RBC: 4.58 MIL/uL (ref 3.87–5.11)
RDW: 13.1 % (ref 11.5–15.5)
WBC: 9.8 10*3/uL (ref 4.0–10.5)
nRBC: 0 % (ref 0.0–0.2)

## 2020-06-03 NOTE — Progress Notes (Signed)
Primary Care Physician: Orpah Melter, MD Referring Physician: Dr. [facility]   [name] is a 86 y.o. female with a h/o new dx of atrial fibrillation recently found on her Linq. She  is in SR today, but high burden on Monday and Tuesday. She has been on metoprolol 12.5 mg bid then 25 mg bid for the last week and was told last to increase to 50 mg bid. She  is very  Concerned re that jump in dose. She  has not started the eliquis, benefit vtrs risk explained. She  will need the 2.5 mg bid for her age over 13 yo and her weight of less than  60 kg. She was very weak with afib earlier in the week.  She received Linq for prior syncope.   F/u in afib clinic, 05/06/20. She is in SR, but does not like  the way she feels on BB, not sleeping, abdominal pains.  She has started  taking 2 baby ASA daily, I question if she started the  eliquis as she states that she is fearful of anticoagulation. Her BP is elevated today but at home runs [id number] systolic.   F/u in afib clinic, 05/20/20. She is in SR. She  still does not like the blood thinner, feels it is making her face feel tight and itchy and intermittent H/A. I switched her to Cardizem from  BB as she felt  it made her feel weak. She still feels weak, maybe less so than with BB. She is unhappy with  Linq  insert, "hurts all the time."   F/u in afib clinic, 12/30 /21. LInq reviewed, no afib noted, will have brief( less than 10 secs of fast rhythm intermittently). Still is not happy with the eliquis and the Cardizem although tolerating the cardizem better than BB.   Today, she denies symptoms of palpitations, chest pain, shortness of breath, orthopnea, PND, lower extremity edema, dizziness, presyncope, syncope, or neurologic sequela. The patient is tolerating medications without difficulties and is otherwise without complaint today.   Past Medical History:  Diagnosis Date  . Arm numbness left   . Back pain   . Bronchitis, chronic ([place])   .  Depression   . Dizziness   . Fatigue   . Hypertension    mild  . Palpitations   . Skin cancer   . Stress   . Syncope    History of    Past Surgical History:  Procedure Laterality Date  . APPENDECTOMY    . CESAREAN SECTION  [phone]    Current Outpatient Medications  Medication Sig Dispense Refill  . apixaban (ELIQUIS) 2.5 MG TABS tablet Take 1 tablet (2.5 mg total) by mouth 2 (two) times daily. 60 tablet 3  . Ascorbic Acid (VITAMIN C) 100 MG tablet Take 100 mg by mouth daily.    . cholecalciferol (VITAMIN D3) 25 MCG (1000 UNIT) tablet Take 1,000 Units by mouth daily.    Marland Kitchen diltiazem (CARDIZEM CD) 120 MG 24 hr capsule Take 1 capsule (120 mg total) by mouth daily. 30 capsule 3  . famotidine (PEPCID) 20 MG tablet One after bfast and bedtime (Patient taking differently: as needed. One after bfast and bedtime)    . folic acid (FOLVITE) 1 MG tablet Take 1 mg by mouth daily.    Marland Kitchen HYDROcodone-acetaminophen (NORCO/VICODIN) 5-325 MG tablet Take 1 tablet by mouth every 4 (four) hours as needed for moderate pain. (Patient taking differently: Take 1 tablet by mouth at bedtime.) 40 tablet  0  . Multiple Vitamins-Minerals (ONE-A-DAY WOMENS 50 PLUS PO) Take 1 tablet by mouth daily.    Marland Kitchen acetaminophen (TYLENOL 8 HOUR) 650 MG CR tablet Take 1 tablet (650 mg total) by mouth every 8 (eight) hours as needed for pain. (Patient not taking: No sig reported) 20 tablet 0   No current facility-administered medications for this encounter.    Allergies  Allergen Reactions  . Ciprofloxacin Other (See Comments)    triggered a migraine that was thought to be a TIA  . Penicillins Rash    Has patient had a PCN reaction causing immediate rash, facial/tongue/throat swelling, SOB or lightheadedness with hypotension: Yes Has patient had a PCN reaction causing severe rash involving mucus membranes or skin necrosis: No Has patient had a PCN reaction that required hospitalization: No Has patient had a PCN  reaction occurring within the last 10 years: No If all of the above answers are "NO", then may proceed with Cephalosporin use.  . Sulfonamide Derivatives Swelling  . Avelox [Moxifloxacin] Other (See Comments)    Rash and severe tingling/numbness   . Reclast [Zoledronic Acid] Other (See Comments)    hypertension  . Chlorpheniramine Other (See Comments)    Headache, stomach ache, bladder "not working well"    Social History   Socioeconomic History  . Marital status: Married    Spouse name: Not on file  . Number of children: Not on file  . Years of education: Not on file  . Highest education level: Not on file  Occupational History  . Not on file  Tobacco Use  . Smoking status: Never Smoker  . Smokeless tobacco: Never Used  Vaping Use  . Vaping Use: Never used  Substance and Sexual Activity  . Alcohol use: Yes    Comment: social ETOH  . Drug use: No  . Sexual activity: Not on file  Other Topics Concern  . Not on file  Social History Narrative  . Not on file   Social Determinants of Health   Financial Resource Strain: Not on file  Food Insecurity: Not on file  Transportation Needs: Not on file  Physical Activity: Not on file  Stress: Not on file  Social Connections: Not on file  Intimate Partner Violence: Not on file    Family History  Problem Relation Age of Onset  . Atrial fibrillation Mother   . Thyroid disease Mother   . Alcohol abuse Father     ROS- All systems are reviewed and negative except as per the HPI above  Physical Exam: Vitals:   06/03/20 1426  BP: (!) 164/76  Pulse: 84  Weight: 57.3 kg  Height: 5\' 4"  (1.626 m)   Wt Readings from Last 3 Encounters:  06/03/20 57.3 kg  05/20/20 56.9 kg  05/06/20 57.6 kg    Labs: Lab Results  Component Value Date   NA 140 12/12/2019   K 4.2 12/12/2019   CL 107 12/12/2019   CO2 24 12/12/2019   GLUCOSE 138 (H) 12/12/2019   BUN 17 12/12/2019   CREATININE 1.11 (H) 12/12/2019   CALCIUM 9.2 12/12/2019    Lab Results  Component Value Date   INR 1.04 04/19/2018   Lab Results  Component Value Date   CHOL 166 01/09/2011   HDL 55 01/09/2011   LDLCALC 97 01/09/2011   TRIG 69 01/09/2011     GEN- The patient is well appearing, alert and oriented x 3 today.   Head- normocephalic, atraumatic Eyes-  Sclera clear, conjunctiva pink Ears-  hearing intact Oropharynx- clear Neck- supple, no JVP Lymph- no cervical lymphadenopathy Lungs- Clear to ausculation bilaterally, normal work of breathing Heart- Regular rate and rhythm, no murmurs, rubs or gallops, PMI not laterally displaced GI- soft, NT, ND, + BS Extremities- no clubbing, cyanosis, or edema MS- no significant deformity or atrophy Skin- no rash or lesion Psych- euthymic mood, full affect Neuro- strength and sensation are intact  EKG-NSR at 84 bpm, pr int 194 ms, qrs int 88 ms, qtc 420 ms  Ling daily reports  reviewed  Echo-. Left ventricular ejection fraction, by estimation, is 60 to 65%. The  left ventricle has normal function. The left ventricle has no regional  wall motion abnormalities. Left ventricular diastolic parameters are  consistent with Grade I diastolic  dysfunction (impaired relaxation).  2. Right ventricular systolic function is normal. The right ventricular  size is normal. There is normal pulmonary artery systolic pressure.  3. The mitral valve is grossly normal. Trivial mitral valve  regurgitation.  4. The aortic valve is normal in structure. Aortic valve regurgitation is  mild.   Assessment and Plan: 1. New onset afib In SR today  She did  not feel well on BB, stopped metoprolol and I started cardizem 120 mg qd, however, she still feels weak, somewhat improved?  Echo normal     2. CHA2DS2VASc score of at least 4 Eliquis  2.5 mg bid   She does not feel like she is tolerating with intermittent H/a itchy face   I offered her xarelto or coumadin but she does not want to switch Cbc/bmet  F/u with Dr.  Curt Bears in one month     Geroge Baseman. Leenah Seidner, [facility] [address] [place], [place] [address] [phone]

## 2020-06-11 ENCOUNTER — Ambulatory Visit: Payer: Medicare Other | Admitting: Internal Medicine

## 2020-06-11 NOTE — Progress Notes (Deleted)
Subjective:    Patient ID: [name], female    DOB: 1932-12-17    MRN: [id number]    Primary Provider/Referring Provider: Dr Doyle Askew   Brief patient profile:  41 yowf never smoker/ MM   with recurrent cough since age 87 with right middle lobe/lingular syndrome with limited bronchiectatic changes on  CT study dated 09/2005    History of Present Illness    03/01/2016  f/u ov/Garrett Mitchum re:  Bronchiectasis with uacs/ some better on gerd rx  Chief Complaint  Patient presents with  . Follow-up    Pt states her dry cough is unchanged since last OV. Pt states she has ocassional chest tightness and SOB but attributes this to stress. Pt also c/o hoarseness.    coughs up to an hour each am, completely non-productive even with flutter  rec No change in medications Nasty mucus > doxycline 100 mg twice daily x 10 days   Late add for nodular opacity RUL c/w MAI    zmax x 250 mg daily x 30 days then recheck cxr       03/29/2016  f/u ov/Taras Rask re: obst bronchiectasis / uacs maint rx zmax  Chief Complaint  Patient presents with  . Follow-up    CXR was done today. She is coughing less but still having some hoarseness.  She gets SOB walking up stairs.    cough is now dry/  throat is worse off all gerd rx  Doe = MMRC1 = can walk nl pace, flat grade, can't hurry or go uphills or steps s sob   rec Please see patient coordinator before you leave today  to schedule HRCT chest  Continue zithromax daily for now  Pantoprazole (protonix) 40 mg   Take  30-60 min before first meal of the day and tagament 400  one @  Bedtime GERD diet     07/07/2016  f/u ov/Augustino Savastano re: obst bronchiectasis/ maint rx zmax Chief Complaint  Patient presents with  . Follow-up    Pt states cough is improving but she does c/o hoarseness.   rec Tagamet should be after bfast and supper  GERD diet Leave off maint zmax > no change in pattern      05/20/2018 cough since 04/2018  Sleeps on 2 pillows but takes hydrocodone  Cough   Mucus mostly white / using flutter valve mostly  in am  rec Prilosec 20mg  should be Take 30- 60 min before your first and last meals of the day as long as you are coughing or need  any cough medication  GERD diet  Depomedrol 120 mg IM  For cough mucinex or mucinex dm up 1200 mg every 12 hours and flutter valve and supplement with the hydrocodone      06/11/2018 acute  ov/Orby Tangen re: bronchiectasis/ cough really never better since early Nov 2019 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 Acute Visit    severe cough x 2 months with white/yellow/green congestion. Has been taking medication but not helping.  Dyspnea:  Fine unless coughing  Cough: better at hs then starts up in  am after stirs and all day and lasts all day with variably purulent but never bloody mus   Sleeping: ok bed flat/ 2 pillows  rec Prednisone 10 mg take  4 each am x 2 days,   2 each am x 2 days,  1 each am x 2 days and stop  levaquin 500 mg one daily x 10 days -  stops if aches in tendons  Stop prilosec and start protonix 40 mg Take 30-60 min before first meal of the day and pepcid 20mg  after supper  For cough mucinex or mucinex dm up 1200 mg every 12 hours and flutter valve and supplement with the hydrocodone      06/24/2018  f/u ov/Carling Liberman re: bronchiectasis with cough since early Nov 2019 not typical of her bronchiectasis Chief Complaint  Patient presents with  . Follow-up    cough with white mucus and ocasion green piece, ribs hurt from coughing, feel like eyes swollen  Dyspnea:  No change  = MMRC1 = can walk nl pace, flat grade, can't hurry or go uphills or steps s sob   Cough: dry > wet and day > night and?  nauseated form mucinex dm/ using flutter valve but only hs vicodin Sleeping: sleeping flat on L side s noct symptoms rec Take delsym two tsp every 12 hours and supplement if needed with vicodin mg up to 1-2 every 4 hours to suppress the urge to cough.   Once you have eliminated the cough  for 3 straight days try reducing the vicodin  first,  then the delsym as tolerated.   Use the flutter valve as much as possible and stop mucinex   Change the pepcid to where you take it after supper     07/22/2018  f/u ov/Aslan Montagna re: bronchiectasis / cyclical cough since nov 2019 resolved  Chief Complaint  Patient presents with  . Follow-up    Cough has improved.   Dyspnea:  Not limited by breathing from desired activities   Cough: no mucus production/ no longer using delsym or vicodin  Sleeping: bed is flat, 2 pillows, prefers L side  SABA use: none 02: none  rec Mucinex dm would be a better choice to take as needed instead of the mucinex or delsym Vicodin is for emergency purposes when you can't step coughing  Keep the cat out of your bedroom if at all possible    Virtual Visit via Telephone Note 10/21/2018 re obst bronchiectasis/ cyclical cough   History of Present Illness:  Dyspnea:   sob - walks several hundred feet across parking lot daily  to office/ also walking once daily on street 10-36min and limited by strength/ energy  Cough: better now, cough occurs p waking up, no excess production Sleeping: flat bed with pillows  SABA use: none  02: no  Eating well but lots of lean foods and "don't know why not able to gain wt"  rec No change in medications  You need to eat more calorie to maintain your weight by eating more fatty meats/ starches     09/19/2019  f/u ov/Velia Pamer re: obst bronchiectasis  2nd pfizer 09/17/19 > tol ok  Chief Complaint  Patient presents with  . Follow-up    She states coughing up chunks of black sputum last wk. She states that if she lies down flat she coughs up green to yellow sputum.    Dyspnea:  Less active/ no power walks, works from home Cough: immediate when supine green, most am's cough x 40 min >  Same x months / using flutter and mucinex sparingly  Sleeping: bed is flat, 2 pillows  SABA use: none  02: none  Main concern is daytime tickle in  throat, has seen [facility]  in past  rec  GERD diet  For cough > mucinex dm 1200 mg every 12 hours and use the flutter valve as much  as you can Mucus gets nastier, thicker or bloody or fever > levaquin 500 mg daily x 10 days    02/03/2020  f/u ov/Alvar Malinoski re: bronchiectasis  Chief Complaint  Patient presents with  . Follow-up    SOB and fatigue  Dyspnea:  Broke foot  When fell so no regular wallking now Cough: white, worse in am 's Sleeping: bed flat, 2 pillows  SABA use: none  02: none Rec I will refer you back to Dr Gala Lewandowsky office  re your thyroid nodule    06/11/2020  f/u ov/Arlina Sabina re:  No chief complaint on file.    Dyspnea:  *** Cough: *** Sleeping: *** SABA use: *** 02: ***   No obvious day to day or daytime variability or assoc excess/ purulent sputum or mucus plugs or hemoptysis or cp or chest tightness, subjective wheeze or overt sinus or hb symptoms.   *** without nocturnal  or early am exacerbation  of respiratory  c/o's or need for noct saba. Also denies any obvious fluctuation of symptoms with weather or environmental changes or other aggravating or alleviating factors except as outlined above   No unusual exposure hx or h/o childhood pna/ asthma or knowledge of premature birth.  Current Allergies, Complete Past Medical History, Past Surgical History, Family History, and Social History were reviewed in [employer] record.  ROS  The following are not active complaints unless bolded Hoarseness, sore throat, dysphagia, dental problems, itching, sneezing,  nasal congestion or discharge of excess mucus or purulent secretions, ear ache,   fever, chills, sweats, unintended wt loss or wt gain, classically pleuritic or exertional cp,  orthopnea pnd or arm/hand swelling  or leg swelling, presyncope, palpitations, abdominal pain, anorexia, nausea, vomiting, diarrhea  or change in bowel habits or change in bladder habits, change in stools or change in urine,  dysuria, hematuria,  rash, arthralgias, visual complaints, headache, numbness, weakness or ataxia or problems with walking or coordination,  change in mood or  memory.        No outpatient medications have been marked as taking for the 06/11/20 encounter (Appointment) with Tanda Rockers, MD.                Past Medical History:  BRONCHIECTASIS (ICD-494.0) see CT scan of the chest dated 09/29/2005  - Pneumovax April 28, 2009 (over 65 so last one)  Prevnar given 04/27/2014   - Alpha one Screen  August 18, 2010 = MM  - Sinus CT August 18, 2010 >>  neg  COUGH, CHRONIC (ICD-786.2)  Left Kidney Cyst  - 04/21/09 [facility] PROCEDURE(S): ULTRASOUND GUIDED AND FLUOROSCOPIC GUIDED LEFT RENAL CYST ASPIRATION AND SCLEROSIS  LUQ Abd pain 2011...............................................................Marland KitchenSchooler  - CT ABD 07/29/10 no etiology        Objective:   Physical Exam  06/11/2020           ***  02/03/2020       124  09/19/2019 120  07/22/2018        122  05/20/2018       129  11/30/2017        131  March 15,2012 137        Min barrel***        Assessment & Plan:

## 2020-06-14 ENCOUNTER — Ambulatory Visit (INDEPENDENT_AMBULATORY_CARE_PROVIDER_SITE_OTHER): Payer: Medicare Other

## 2020-06-14 DIAGNOSIS — R55 Syncope and collapse: Secondary | ICD-10-CM | POA: Diagnosis not present

## 2020-06-15 LAB — CUP PACEART REMOTE DEVICE CHECK
Date Time Interrogation Session: 20220108230504
Implantable Pulse Generator Implant Date: 20210824

## 2020-06-29 NOTE — Progress Notes (Signed)
Carelink Summary Report / Loop Recorder 

## 2020-06-30 ENCOUNTER — Telehealth: Payer: Self-pay

## 2020-06-30 ENCOUNTER — Telehealth: Payer: Self-pay | Admitting: Cardiology

## 2020-06-30 NOTE — Telephone Encounter (Signed)
Patient called in stating that she felt bad yesterday she thinks that she was in afib and she wants to see if anything showed on her transmission. Patient states that she does have a headache today and wants to make sure everything is okay before she goes out of town. Patient is sending a transmission

## 2020-06-30 NOTE — Telephone Encounter (Signed)
Patient states she is leaving for a trip today and is feeling a little achey. She wants to know what medication would be safe for her for any aches or a fever in conjunction with the medications she is currently prescribed to take. Please call/advise  Thank you!

## 2020-06-30 NOTE — Telephone Encounter (Signed)
Patient called to answer questions regarding safe medications to take for aches/pains. This nurse advised that Tylenol was safe to take with the Eliquis.

## 2020-06-30 NOTE — Telephone Encounter (Signed)
Attempted to return call to patient, no answer.  LVM advising that transmission was received and pt was in AF for about 2.5 hours yesterday.  Pt has known history of PAF on Eliquis for [place].   AF managed in AF clinic.  Pt also takes Diltiazem 120mg  daily.   Provided DC phone number for pt to call back if she has any questions.

## 2020-07-05 ENCOUNTER — Other Ambulatory Visit: Payer: Self-pay

## 2020-07-05 ENCOUNTER — Ambulatory Visit (INDEPENDENT_AMBULATORY_CARE_PROVIDER_SITE_OTHER): Payer: Medicare Other | Admitting: Cardiology

## 2020-07-05 ENCOUNTER — Encounter: Payer: Self-pay | Admitting: Cardiology

## 2020-07-05 VITALS — BP 148/78 | HR 80 | Ht 64.0 in | Wt 125.0 lb

## 2020-07-05 DIAGNOSIS — I480 Paroxysmal atrial fibrillation: Secondary | ICD-10-CM | POA: Diagnosis not present

## 2020-07-05 MED ORDER — DRONEDARONE HCL 400 MG PO TABS: 400.0000 mg | ORAL_TABLET | Freq: Two times a day (BID) | ORAL | 3 refills | Status: DC

## 2020-07-05 MED ORDER — DILTIAZEM HCL 30 MG PO TABS
30.0000 mg | ORAL_TABLET | Freq: Four times a day (QID) | ORAL | 2 refills | Status: DC | PRN
Start: 2020-07-05 — End: 2021-01-11

## 2020-07-05 NOTE — Progress Notes (Signed)
Electrophysiology Office Note   Date:  07/05/2020   ID:  [name], DOB 06-Jun-1932, MRN [id number]  PCP:  Rita Melter, MD  Cardiologist:  [name] Primary Electrophysiologist:  Rita Cerone Meredith Leeds, MD    Chief Complaint: Syncope   History of Present Illness: [name] is a 87 y.o. female who is being seen today for the evaluation of syncope at the request of Rita Melter, MD. Presenting today for electrophysiology evaluation.  She has a history significant for hypertension, paroxysmal atrial fibrillation, and syncope.  She presented to emergency room 12/12/2019 with episodes of syncope.  She woke up with palpitations and chest tightness.  She was walking to the bathroom and woke up on the floor.  After episode, she had generalized weakness and chest tightness.  She is now status post Linq monitor implant.  Today, denies symptoms of chest pain, shortness of breath, orthopnea, PND, lower extremity edema, claudication, dizziness, presyncope, syncope, bleeding, or neurologic sequela. The patient is tolerating medications without difficulties.  She continues to have palpitations.  She has no chest pain or shortness of breath.  He is able to do all of her daily activities.  She says that she wakes up at times at night having palpitations, usually at 4 in the morning.  These also occur throughout the day.  She is taking diltiazem, which she feels is causing her to feel weak and fatigued throughout most of the day as well.   Past Medical History:  Diagnosis Date  . Arm numbness left   . Back pain   . Bronchitis, chronic ([place])   . Depression   . Dizziness   . Fatigue   . Hypertension    mild  . Palpitations   . Skin cancer   . Stress   . Syncope    History of    Past Surgical History:  Procedure Laterality Date  . APPENDECTOMY    . CESAREAN SECTION  [phone]     Current Outpatient Medications  Medication Sig Dispense Refill  . apixaban (ELIQUIS) 2.5 MG TABS  tablet Take 1 tablet (2.5 mg total) by mouth 2 (two) times daily. 60 tablet 3  . Ascorbic Acid (VITAMIN C) 100 MG tablet Take 100 mg by mouth daily.    . cholecalciferol (VITAMIN D3) 25 MCG (1000 UNIT) tablet Take 1,000 Units by mouth daily.    Marland Kitchen diltiazem (CARDIZEM CD) 120 MG 24 hr capsule Take 1 capsule (120 mg total) by mouth daily. 30 capsule 3  . folic acid (FOLVITE) 1 MG tablet Take 1 mg by mouth daily.    Marland Kitchen HYDROcodone-acetaminophen (NORCO/VICODIN) 5-325 MG tablet Take 1 tablet by mouth every 4 (four) hours as needed for moderate pain. (Patient taking differently: Take 1 tablet by mouth at bedtime.) 40 tablet 0  . Multiple Vitamins-Minerals (ONE-A-DAY WOMENS 50 PLUS PO) Take 1 tablet by mouth daily.     No current facility-administered medications for this visit.    Allergies:   Ciprofloxacin, Penicillins, Sulfonamide derivatives, Avelox [moxifloxacin], Reclast [zoledronic acid], and Chlorpheniramine   Social History:  The patient  reports that she has never smoked. She has never used smokeless tobacco. She reports current alcohol use. She reports that she does not use drugs.   Family History:  The patient's family history includes Alcohol abuse in her father; Atrial fibrillation in her mother; Thyroid disease in her mother.   ROS:  Please see the history of present illness.   Otherwise, review of systems is positive for none.  All other systems are reviewed and negative.   PHYSICAL EXAM: VS:  BP (!) 148/78   Pulse 80   Ht 5\' 4"  (1.626 m)   Wt 125 lb (56.7 kg)   SpO2 100%   BMI 21.46 kg/m  , BMI Body mass index is 21.46 kg/m. GEN: Well nourished, well developed, in no acute distress  HEENT: normal  Neck: no JVD, carotid bruits, or masses Cardiac: RRR; no murmurs, rubs, or gallops,no edema  Respiratory:  clear to auscultation bilaterally, normal work of breathing GI: soft, nontender, nondistended, + BS MS: no deformity or atrophy  Skin: warm and dry, device site well  healed Neuro:  Strength and sensation are intact Psych: euthymic mood, full affect  EKG:  EKG is not ordered today. Personal review of the ekg ordered 06/03/20 shows sinus rhythm, rate 84  Personal review of the device interrogation today. Results in [place]: 09/15/2019: TSH 1.950 06/03/2020: BUN 20; Creatinine, Ser 1.19; Hemoglobin 14.1; Platelets 247; Potassium 4.7; Sodium 139    Lipid Panel     Component Value Date/Time   CHOL 166 01/09/2011 0455   TRIG 69 01/09/2011 0455   HDL 55 01/09/2011 0455   CHOLHDL 3.0 01/09/2011 0455   VLDL 14 01/09/2011 0455   LDLCALC 97 01/09/2011 0455     Wt Readings from Last 3 Encounters:  07/05/20 125 lb (56.7 kg)  06/03/20 126 lb 6.4 oz (57.3 kg)  05/20/20 125 lb 6.4 oz (56.9 kg)      Other studies Reviewed: Additional studies/ records that were reviewed today include: Cardiac monitor 10/23/2019 personally reviewed Review of the above records today demonstrates:   Sinus rhythm including sinus bradycardia ( predominately at night )  Episodes of PSVT lasting between 6-8 seconds.    ASSESSMENT AND PLAN:  1.  Paroxysmal atrial fibrillation/SVT: CHA2DS2-VASc of 4.  Currently on Eliquis.  She is also on diltiazem but is having significant issues with this.  She has continued palpitations as well as fatigue on diltiazem.  We [place] [name] stop diltiazem today and give her 30 mg to take on an as-needed basis.  We [name] also start Multaq.  If she cannot afford this medication, amiodarone or Tikosyn would be another option.  2.  Syncope: Status post Linq monitor.  No further episodes.  Current medicines are reviewed at length with the patient today.   The patient does not have concerns regarding her medicines.  The following changes were made today: Start Multaq  Labs/ tests ordered today include:  No orders of the defined types were placed in this encounter.    Disposition:   FU with [name] 3 months  Signed, Rita Savarino Meredith Leeds, MD  07/05/2020 3:59 PM     [name] [address] [phone] (office) [phone] (fax)

## 2020-07-05 NOTE — Patient Instructions (Addendum)
Medication Instructions:  Your physician has recommended you make the following change in your medication:  1. STOP Diltiazem 120 mg 2. START Diltiazem 30 mg every 6 hours as needed for elevated heart rates 3. START Multaq 400 mg twice a day  *If you need a refill on your cardiac medications before your next appointment, please call your pharmacy*   Lab Work: None ordered   Testing/Procedures: None ordered   Follow-Up: At [facility], you and your health needs are our priority.  As part of our continuing mission to provide you with exceptional heart care, we have created designated Provider Care Teams.  These Care Teams include your primary Cardiologist (physician) and Advanced Practice Providers (APPs -  Physician Assistants and Nurse Practitioners) who all work together to provide you with the care you need, when you need it.  We recommend signing up for the patient portal called "MyChart".  Sign up information is provided on this After Visit Summary.  MyChart is used to connect with patients for Virtual Visits (Telemedicine).  Patients are able to view lab/test results, encounter notes, upcoming appointments, etc.  Non-urgent messages can be sent to your provider as well.   To learn more about what you can do with MyChart, go to [email or web address].    Remote monitoring is used to monitor your Pacemaker or ICD from home. This monitoring reduces the number of office visits required to check your device to one time per year. It allows [place] to keep an eye on the functioning of your device to ensure it is working properly. You are scheduled for a device check from home on 07/19/20. You may send your transmission at any time that day. If you have a wireless device, the transmission will be sent automatically. After your physician reviews your transmission, you will receive a postcard with your next transmission date.  Your next appointment:   3 month(s)  The format for your next  appointment:   In Person  Provider:   Allegra Lai, MD   Thank you for choosing [facility]!!   Trinidad Curet, RN [phone]    Other Instructions

## 2020-07-09 ENCOUNTER — Ambulatory Visit: Payer: Medicare Other | Admitting: Internal Medicine

## 2020-07-09 ENCOUNTER — Telehealth: Payer: Self-pay | Admitting: Cardiology

## 2020-07-09 NOTE — Telephone Encounter (Signed)
    Pt c/o medication issue:  1. Name of Medication: dronedarone (MULTAQ) 400 MG tablet  2. How are you currently taking this medication (dosage and times per day)? Take 1 tablet (400 mg total) by mouth 2 (two) times daily with a meal.  3. Are you having a reaction (difficulty breathing--STAT)?   4. What is your medication issue? Pt said he has some questions about this medication. She requested for Dr. Curt Bears to call her back

## 2020-07-09 NOTE — Telephone Encounter (Addendum)
Pt reports medication costs $939, but she only has to pay $45/month. This is concern being that she is already paying for Eliquis & Hydrocodone [employer] pt connection phone number given to pt in case cost becomes an issue. Educated to medication. Patient verbalized understanding and agreeable to plan.

## 2020-07-15 ENCOUNTER — Telehealth: Payer: Self-pay | Admitting: Cardiovascular Disease

## 2020-07-15 NOTE — Telephone Encounter (Signed)
Pt c/o medication issue:  1. Name of Medication: dronedarone (MULTAQ) 400 MG tablet [[id number]]   2. How are you currently taking this medication (dosage and times per day)? Take 1 tablet (400 mg total) by mouth 2 (two) times daily with a meal.  3. Are you having a reaction (difficulty breathing--STAT)?   4. What is your medication issue? Pt stated that she has server stomach ache.  She stated that her back is hurting.  She stated she thinks it might be the Multaq that is causing the cramps.  She stated that her chest is hurting still from where the loop recorder is.  She has a client at 5 and would like someone to call her before then   Best number [phone]

## 2020-07-15 NOTE — Telephone Encounter (Signed)
Called patient to let her know that her message will be sent to Dr. Curt Bears. Patient stated she has not taken the multaq today. Patient stated her BP 140/70 and HR 78. Patient stated she has a hard time tolerating medications. Will see what Dr. Curt Bears advises.

## 2020-07-15 NOTE — Telephone Encounter (Signed)
Will forward to Pharm D  

## 2020-07-15 NOTE — Telephone Encounter (Signed)
Multaq can cause abdominal pain in ~5% of patients, it typically doesn't cause back pain though. The best way to tell if Multaq is causing pt's issues would be to trial off of it, however would defer to Dr Curt Bears as she would likely need to be started on a different anti-arrhythmic in its place.

## 2020-07-16 ENCOUNTER — Encounter: Payer: Self-pay | Admitting: Cardiovascular Disease

## 2020-07-16 NOTE — Telephone Encounter (Signed)
Last took [facility] Wednesday. She is still having some issues today and hoping it improves the longer she is off the Multaq. Pt made aware 2 antiarrythmic choices left -- Amiodarone & Tikoysn.  Asked pt to write the names of both medications down and research them over the weekend.  She reponds that she does not want to do that yet, she wants to "recover" from [place] first. Informed pt that I would follow up next week/week after to see how she is doing and what she prefers to do, per pt request. Pt advised to call office if issues arise b/t now and when I follow up with her. Pt agreeable to plan.

## 2020-07-16 NOTE — Telephone Encounter (Signed)
Roxy Manns A at 07/16/2020 4:19 PM  Status: Signed    Patient is returning Jahden Schara's call. Please advise.

## 2020-07-16 NOTE — Telephone Encounter (Signed)
This encounter was created in error - please disregard.

## 2020-07-16 NOTE — Telephone Encounter (Signed)
Left message to call back  

## 2020-07-16 NOTE — Telephone Encounter (Signed)
Patient is returning [name]'s call. Please advise.

## 2020-07-17 LAB — CUP PACEART REMOTE DEVICE CHECK
Date Time Interrogation Session: 20220210230133
Implantable Pulse Generator Implant Date: 20210824

## 2020-07-19 ENCOUNTER — Ambulatory Visit (INDEPENDENT_AMBULATORY_CARE_PROVIDER_SITE_OTHER): Payer: Medicare Other

## 2020-07-19 DIAGNOSIS — R55 Syncope and collapse: Secondary | ICD-10-CM | POA: Diagnosis not present

## 2020-07-22 DIAGNOSIS — Z0000 Encounter for general adult medical examination without abnormal findings: Secondary | ICD-10-CM | POA: Diagnosis not present

## 2020-07-22 DIAGNOSIS — E042 Nontoxic multinodular goiter: Secondary | ICD-10-CM | POA: Diagnosis not present

## 2020-07-22 DIAGNOSIS — I4811 Longstanding persistent atrial fibrillation: Secondary | ICD-10-CM | POA: Diagnosis not present

## 2020-07-22 DIAGNOSIS — M810 Age-related osteoporosis without current pathological fracture: Secondary | ICD-10-CM | POA: Diagnosis not present

## 2020-07-22 DIAGNOSIS — I10 Essential (primary) hypertension: Secondary | ICD-10-CM | POA: Diagnosis not present

## 2020-07-22 DIAGNOSIS — Z136 Encounter for screening for cardiovascular disorders: Secondary | ICD-10-CM | POA: Diagnosis not present

## 2020-07-22 NOTE — Progress Notes (Signed)
Carelink Summary Report / Loop Recorder 

## 2020-07-23 NOTE — Telephone Encounter (Addendum)
Followed up with pt as instructed. Pt reports that she is still "very weak", "feels terrible", "fast heart beat some". Pt saw PCP yesterday for her annual check up:  BP 140/70, HR 90. She does report feeling like she was close to fainting this morning, states "Eyes got that circle black stuff, ate some peanut butter and drank some water and it passed off". Pt has not researched medications yet.  Pt advised to research this week as we would need to make a decision soon.  Spelled out medications to research and she is agreeable to looking at them this weekend. Pt also is going to try taking the Multaq again to see if same SE begin. Aware I will check with device clinic on what ILR has been showing. Aware I will follow up next week. Patient verbalized understanding and agreeable to plan.     Device clinic reports pt had SVT episode yesterday, 11:58 am, HRs 150s, lasting 12 seconds.

## 2020-07-26 NOTE — Telephone Encounter (Signed)
Pt reports that she did NOT try the Multaq again. She did research the medications. She is not sure that she should do anything at this time being that she is not having AFib nor issues r/t it.  She would prefer to wait and monitor if Dr. Curt Bears agreeable, but is willing to do consider/discuss what he recommends. Aware I will discuss with him and let her know his recommendation. Pt is agreeable to plan.

## 2020-08-02 ENCOUNTER — Other Ambulatory Visit: Payer: Self-pay

## 2020-08-02 ENCOUNTER — Encounter: Payer: Self-pay | Admitting: Internal Medicine

## 2020-08-02 ENCOUNTER — Ambulatory Visit (INDEPENDENT_AMBULATORY_CARE_PROVIDER_SITE_OTHER): Payer: Medicare Other | Admitting: Internal Medicine

## 2020-08-02 ENCOUNTER — Ambulatory Visit (INDEPENDENT_AMBULATORY_CARE_PROVIDER_SITE_OTHER): Payer: Medicare Other

## 2020-08-02 DIAGNOSIS — J479 Bronchiectasis, uncomplicated: Secondary | ICD-10-CM

## 2020-08-02 DIAGNOSIS — R058 Other specified cough: Secondary | ICD-10-CM | POA: Diagnosis not present

## 2020-08-02 DIAGNOSIS — R079 Chest pain, unspecified: Secondary | ICD-10-CM | POA: Diagnosis not present

## 2020-08-02 MED ORDER — LEVOFLOXACIN 500 MG PO TABS: 500.0000 mg | ORAL_TABLET | Freq: Every day | ORAL | 11 refills | Status: DC

## 2020-08-02 MED ORDER — FAMOTIDINE 20 MG PO TABS: ORAL_TABLET | ORAL | 11 refills | Status: DC

## 2020-08-02 MED ORDER — PANTOPRAZOLE SODIUM 40 MG PO TBEC: 40.0000 mg | DELAYED_RELEASE_TABLET | Freq: Every day | ORAL | 2 refills | Status: DC

## 2020-08-02 NOTE — Progress Notes (Signed)
Subjective:    Patient ID: [name], female    DOB: 11/21/1932    MRN: [id number]    Primary Provider/Referring Provider: Dr Doyle Askew   Brief patient profile:  77 yowf never smoker/ MM   with recurrent cough since age 87 with right middle lobe/lingular syndrome with limited bronchiectatic changes on  CT study dated 09/2005    History of Present Illness    03/01/2016  f/u ov/Manali Mcelmurry re:  Bronchiectasis with uacs/ some better on gerd rx  Chief Complaint  Patient presents with  . Follow-up    Pt states her dry cough is unchanged since last OV. Pt states she has ocassional chest tightness and SOB but attributes this to stress. Pt also c/o hoarseness.    coughs up to an hour each am, completely non-productive even with flutter  rec No change in medications Nasty mucus > doxycline 100 mg twice daily x 10 days   Late add for nodular opacity RUL c/w MAI    zmax x 250 mg daily x 30 days then recheck cxr      02/03/2020  f/u ov/Rollins Wrightson re: bronchiectasis  Chief Complaint  Patient presents with  . Follow-up    SOB and fatigue  Dyspnea:  Broke foot  When fell so no regular wallking now Cough: white, worse in am 's Sleeping: bed flat, 2 pillows  SABA use: none  02: none rec thyroid nodule f/u gerkin   08/02/2020  f/u ov/Elani Delph re: bronchiectasis, not following action plan/ no longer on gerd rx  Chief Complaint  Patient presents with  . Follow-up    Chest pain and productive cough  Dyspnea:  Steps one way up / heart pounds when go  Cough: changed colors in am x 6 weeks assoc with honking,not using mucinex, never started levaquin positonal Chest pain under the left breast x years but for the last 6 weeks worse with cough Sleeping: flat bed with 2 pillows - worse x 6weeks due to increaseing cough  SABA use: none  02: none  Covid status:   vax x3    No obvious day to day or daytime variability or assoc excess/ purulent sputum or mucus plugs or hemoptysis or cp or chest tightness,  subjective wheeze or overt sinus or hb symptoms.     Also denies any obvious fluctuation of symptoms with weather or environmental changes or other aggravating or alleviating factors except as outlined above   No unusual exposure hx or h/o childhood pna/ asthma or knowledge of premature birth.  Current Allergies, Complete Past Medical History, Past Surgical History, Family History, and Social History were reviewed in [employer] record.  ROS  The following are not active complaints unless bolded Hoarseness, sore throat, dysphagia, dental problems, itching, sneezing,  nasal congestion or discharge of excess mucus or purulent secretions, ear ache,   fever, chills, sweats, unintended wt loss or wt gain, classically pleuritic or exertional cp,  orthopnea pnd or arm/hand swelling  or leg swelling, presyncope, palpitations, abdominal pain, anorexia, nausea, vomiting, diarrhea  or change in bowel habits or change in bladder habits, change in stools or change in urine, dysuria, hematuria,  rash, arthralgias, visual complaints, headache, numbness, weakness or ataxia or problems with walking or coordination,  change in mood or  memory.        Current Meds  Medication Sig  . apixaban (ELIQUIS) 2.5 MG TABS tablet Take 1 tablet (2.5 mg total) by mouth 2 (two) times daily.  . Ascorbic Acid (  VITAMIN C) 100 MG tablet Take 100 mg by mouth daily.  . cholecalciferol (VITAMIN D3) 25 MCG (1000 UNIT) tablet Take 1,000 Units by mouth daily.  Marland Kitchen diltiazem (CARDIZEM) 30 MG tablet Take 1 tablet (30 mg total) by mouth 4 (four) times daily as needed (for elevated heart rates).  . folic acid (FOLVITE) 1 MG tablet Take 1 mg by mouth daily.  Marland Kitchen HYDROcodone-acetaminophen (NORCO/VICODIN) 5-325 MG tablet Take 1 tablet by mouth every 4 (four) hours as needed for moderate pain. (Patient taking differently: Take 1 tablet by mouth at bedtime.)  . Multiple Vitamins-Minerals (ONE-A-DAY WOMENS 50 PLUS PO) Take 1  tablet by mouth daily.             Past Medical History:  BRONCHIECTASIS (ICD-494.0) see CT scan of the chest dated 09/29/2005  - Pneumovax April 28, 2009 (over 65 so last one)  Prevnar given 04/27/2014   - Alpha one Screen  August 18, 2010 = MM  - Sinus CT August 18, 2010 >>  neg  COUGH, CHRONIC (ICD-786.2)  Left Kidney Cyst  - 04/21/09 [facility] PROCEDURE(S): ULTRASOUND GUIDED AND FLUOROSCOPIC GUIDED LEFT RENAL CYST ASPIRATION AND SCLEROSIS  LUQ Abd pain 2011...............................................................Marland KitchenSchooler  - CT ABD 07/29/10 no etiology        Objective:   Physical Exam  08/02/2020         126   02/03/2020        124  Wt  09/19/2019  120  07/22/2018         122  05/20/2018       129  11/30/2017         131  March 15,2012 137    Vital signs reviewed  08/02/2020  - Note at rest 02 sats  97% on RA   General appearance:    Well preserved amb thin wf nad      HEENT : pt wearing mask not removed for exam due to covid - 19 concerns.   NECK :  without JVD/Nodes/TM/ nl carotid upstrokes bilaterally   LUNGS: no acc muscle use,  Min barrel  contour chest wall with bilateral minimal rhonchi  and  without cough on insp or exp maneuvers and min  Hyperresonant  to  percussion bilaterally     CV:  RRR  no s3 or murmur or increase in P2, and no edema   ABD:  soft and nontender with pos end  insp Hoover's  in the supine position. No bruits or organomegaly appreciated, bowel sounds nl  MS:   Nl gait/  ext warm without deformities, calf tenderness, cyanosis or clubbing No obvious joint restrictions   SKIN: warm and dry without lesions    NEURO:  alert, approp, nl sensorium with  no motor or cerebellar deficits apparent.    CXR PA and Lateral:   08/02/2020 :    I personally reviewed images and agree with radiology impression as follows:         Assessment & Plan:

## 2020-08-02 NOTE — Assessment & Plan Note (Signed)
Onset ? Age 87 wit bronchiectasis first confirmed 2007  - Alpha one Screen  August 18, 2010 = MM  - See CT Chest 09/29/05 Stable bronchiectasis in right middle lobe and lingula.  Scattered tree in bud opacities throughout the lungs, right greater than left,  nonspecific.   - PFT's 06/21/2012 1.67 (91%) ratio 64 and no change,  DLCO 77% - Flutter valve added 04/27/2014  - PFTs 05/24/15   FEV1 1.55 (81%) ratio 69 p 6% improvement from saba and dlco 62% ratio 88%  - HRCT 04/04/16 >>> 1. 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 2. An incidental finding of potential clinical significance has been found. Dominant nodule in the right upper lobe, likely infectious/inflammatory in etiology.  - Zmax 250 mg daily 03/01/16 - 05/01/16 improved cough at d/c but diarrhea while on it and no change in nodule RUL > d/c 07/07/16 - 08/28/2017 added back cycles of zpak prn flare of purulent bronchitis > d/c 05/20/2018  - levaquin 500 x 10 days prn 06/11/2018 - flutter valve training 06/24/18 Chest CT 09/09/2018 Increased tree-in-bud nodularity in right lower lobe since 2016  Flare x 6 weeks and did not have levaquin or mucinex dm but does have flutter.   Advised this will be recurrent problem and needs to follow action plan to control it see avs for instructions unique to this ov

## 2020-08-02 NOTE — Assessment & Plan Note (Addendum)
Trial of max gerd rx 11/24/2015 >> worse off rx 03/29/2016 and 08/28/2017 > resume ppi qam ac> resolved as of 11/30/2017   - Flared again 04/2018 > rechallenged 06/11/2018 and did not follow instructions> resolved 07/22/2018  - worse again 09/19/2019 rec increase pepcid to 20 mg bid and diet/bed blocks, f/u ent prn ([facility])  Flared off gerd rx suggestive of cyclical cough: Of the three most common causes of  Sub-acute / recurrent or chronic cough, only one (GERD)  can actually contribute to/ trigger  the other two (asthma and post nasal drip syndrome)  and perpetuate the cylce of cough.  While not intuitively obvious, many patients with chronic low grade reflux do not cough until there is a primary insult that disturbs the protective epithelial barrier and exposes sensitive nerve endings.   This is typically viral but can due to PNDS and  either may apply here.   The point is that once this occurs, it is difficult to eliminate the cycle  using anything but a maximally effective acid suppression regimen at least in the short run, accompanied by an appropriate diet to address non acid GERD and control / eliminate the cough itself for at least 3 days by using narcotics (which she already has on hand)  to supplement mucinex dm/ flutter and this may help sort out the various recurretn chest wall pain she's having with no chest correlation with bronchiectasis to date so unlikely related x for the violence of the cough which may contribute to mscp.          Each maintenance medication was reviewed in detail including emphasizing most importantly the difference between maintenance and prns and under what circumstances the prns are to be triggered using an action plan format where appropriate.  Total time for H and P, chart review, counseling, reviewing   and generating customized AVS unique to this office visit / same day charting =   31 min

## 2020-08-02 NOTE — Patient Instructions (Addendum)
For cough/congestion >>>   mucinex dm is up 1200 mg every 12 hours and use the flutter    For nasty mucus >  levaquin 500 mg one daily x 10 days  Pantoprazole (protonix) 40 mg   Take  30-60 min before first meal of the day and Pepcid (famotidine)  20 mg one after Supper  until return to office - this is the best way to tell whether stomach acid is contributing to your problem.    GERD (REFLUX)  is an extremely common cause of respiratory symptoms just like yours , many times with no obvious heartburn at all.    It can be treated with medication, but also with lifestyle changes including elevation of the head of your bed (ideally with 6 -8inch blocks under the headboard of your bed),  Smoking cessation, avoidance of late meals, excessive alcohol, and avoid fatty foods, chocolate, peppermint, colas, red wine, and acidic juices such as orange juice.  NO MINT OR MENTHOL PRODUCTS SO NO COUGH DROPS  USE SUGARLESS CANDY INSTEAD (Jolley ranchers or Stover's or Life Savers) or even ice chips will also do - the key is to swallow to prevent all throat clearing. NO OIL BASED VITAMINS - use powdered substitutes.  Avoid fish oil when coughing.    Please remember to go to the  x-ray department  for your tests - we will call you with the results when they are available    We will be calling you for allergy evaluation  Please schedule a follow up office visit in 6 weeks, call sooner if needed

## 2020-08-03 ENCOUNTER — Encounter: Payer: Self-pay | Admitting: *Deleted

## 2020-08-03 NOTE — Telephone Encounter (Signed)
Pt called in and stated she spoke to sherri last week about meds but is still confused about her meds.  She stated now her lung dr has given her more and she is not use to taking all these meds.  She is just not sure what to do about all the meds    Best number  [phone]

## 2020-08-03 NOTE — Telephone Encounter (Signed)
Spoke to pt. She reports that "I am being loaded up with meds that I am not used to". Also states that she saw PCP and doing well.  Aware that I had her in Dr. Curt Bears folder to review later this week. She still reports that she is doing well w/ no AFib. Aware I am still going to discuss a monitor approach vs med approach w/ Dr. Curt Bears and will let her know by next week. Pt is agreeable to plan.

## 2020-08-06 ENCOUNTER — Other Ambulatory Visit: Payer: Self-pay | Admitting: Physical Medicine and Rehabilitation

## 2020-08-06 ENCOUNTER — Other Ambulatory Visit: Payer: Self-pay

## 2020-08-06 DIAGNOSIS — M4802 Spinal stenosis, cervical region: Secondary | ICD-10-CM

## 2020-08-10 DIAGNOSIS — M810 Age-related osteoporosis without current pathological fracture: Secondary | ICD-10-CM | POA: Insufficient documentation

## 2020-08-10 DIAGNOSIS — E559 Vitamin D deficiency, unspecified: Secondary | ICD-10-CM | POA: Insufficient documentation

## 2020-08-13 ENCOUNTER — Encounter: Payer: Medicare Other | Admitting: Cardiology

## 2020-08-16 ENCOUNTER — Telehealth: Payer: Self-pay | Admitting: Emergency Medicine

## 2020-08-16 DIAGNOSIS — M8580 Other specified disorders of bone density and structure, unspecified site: Secondary | ICD-10-CM | POA: Diagnosis not present

## 2020-08-16 DIAGNOSIS — R87610 Atypical squamous cells of undetermined significance on cytologic smear of cervix (ASC-US): Secondary | ICD-10-CM | POA: Diagnosis not present

## 2020-08-16 DIAGNOSIS — Z124 Encounter for screening for malignant neoplasm of cervix: Secondary | ICD-10-CM | POA: Diagnosis not present

## 2020-08-16 DIAGNOSIS — Z6822 Body mass index (BMI) 22.0-22.9, adult: Secondary | ICD-10-CM | POA: Diagnosis not present

## 2020-08-16 DIAGNOSIS — Z01411 Encounter for gynecological examination (general) (routine) with abnormal findings: Secondary | ICD-10-CM | POA: Diagnosis not present

## 2020-08-16 DIAGNOSIS — Z01419 Encounter for gynecological examination (general) (routine) without abnormal findings: Secondary | ICD-10-CM | POA: Diagnosis not present

## 2020-08-16 NOTE — Telephone Encounter (Signed)
Attempted to contact patient. Carelink alert received on 08/16/20  AF and symptom tachy events. Unable  To make contact with patient for assessment. Left message on patient's voicemail with [facility] number [phone].

## 2020-08-17 NOTE — Telephone Encounter (Signed)
Patient reports she had chest pain " like elephant on my chest " at time of the episode of AF on 08/15/20. She reports she took ditiazem 30 mg because she felt her heart racing and after a few hours her symptom stopped. No SOB, no syncope and no dizziness. Transmission today showed SR. Af episode 08/15/20 showed AF that lasted 7 hours , 56 minutes.  She reports she no longer takes Multaq due to side effects of sore joints and muscle pain. She has not missed any doses of Eliquis. ED precautions given for CP or worsening cardiac conditions.

## 2020-08-18 ENCOUNTER — Ambulatory Visit
Admission: RE | Admit: 2020-08-18 | Discharge: 2020-08-18 | Disposition: A | Discharge status: Home / Self Care | Payer: Medicare Other | Source: Ambulatory Visit | Attending: Physical Medicine and Rehabilitation | Admitting: Physical Medicine and Rehabilitation

## 2020-08-18 ENCOUNTER — Other Ambulatory Visit: Payer: Self-pay

## 2020-08-18 DIAGNOSIS — M4802 Spinal stenosis, cervical region: Secondary | ICD-10-CM | POA: Diagnosis not present

## 2020-08-19 LAB — CUP PACEART REMOTE DEVICE CHECK
Date Time Interrogation Session: 20220316000259
Implantable Pulse Generator Implant Date: 20210824

## 2020-08-20 DIAGNOSIS — N281 Cyst of kidney, acquired: Secondary | ICD-10-CM | POA: Diagnosis not present

## 2020-08-20 DIAGNOSIS — R1032 Left lower quadrant pain: Secondary | ICD-10-CM | POA: Diagnosis not present

## 2020-08-20 NOTE — Telephone Encounter (Signed)
Patient to come to clinic to discuss other options on rhythm control. Can also see APP or AF clinic.

## 2020-08-23 ENCOUNTER — Ambulatory Visit (INDEPENDENT_AMBULATORY_CARE_PROVIDER_SITE_OTHER): Payer: Medicare Other

## 2020-08-23 DIAGNOSIS — R55 Syncope and collapse: Secondary | ICD-10-CM | POA: Diagnosis not present

## 2020-08-23 NOTE — Telephone Encounter (Signed)
Pt aware will further discuss at next weeks OV. Patient verbalized understanding and agreeable to plan.

## 2020-08-24 DIAGNOSIS — M25552 Pain in left hip: Secondary | ICD-10-CM | POA: Diagnosis not present

## 2020-08-24 DIAGNOSIS — M542 Cervicalgia: Secondary | ICD-10-CM | POA: Diagnosis not present

## 2020-08-24 DIAGNOSIS — M5459 Other low back pain: Secondary | ICD-10-CM | POA: Diagnosis not present

## 2020-08-26 DIAGNOSIS — M810 Age-related osteoporosis without current pathological fracture: Secondary | ICD-10-CM | POA: Diagnosis not present

## 2020-08-26 DIAGNOSIS — Z780 Asymptomatic menopausal state: Secondary | ICD-10-CM | POA: Diagnosis not present

## 2020-08-26 DIAGNOSIS — M8589 Other specified disorders of bone density and structure, multiple sites: Secondary | ICD-10-CM | POA: Diagnosis not present

## 2020-08-30 DIAGNOSIS — R300 Dysuria: Secondary | ICD-10-CM | POA: Diagnosis not present

## 2020-08-31 ENCOUNTER — Telehealth: Payer: Self-pay | Admitting: Cardiology

## 2020-08-31 ENCOUNTER — Encounter: Payer: Medicare Other | Admitting: Cardiology

## 2020-08-31 NOTE — Telephone Encounter (Signed)
Pt reports that Eliquis 5 mg was filled by pharmacy and she states she is only taking 2.5 mg BID. Spoke to [employer] who reports 5mg  fill was a mistake and to have pt bring back to pharmacy and they will correct. Pt informed and agreeable to taking the incorrect dosing back to the pharmacy.  Pt aware I will forward to scheduler to get her rescheduled to see MD to discuss antiarrhythmics. Patient verbalized understanding and agreeable to plan.

## 2020-08-31 NOTE — Telephone Encounter (Signed)
New message:    Patient calling because her refill was sent to [facility] and it was double and she is not sure she should be taking this. Please call patient back.

## 2020-08-31 NOTE — Progress Notes (Signed)
Carelink Summary Report / Loop Recorder 

## 2020-09-02 ENCOUNTER — Telehealth: Payer: Self-pay | Admitting: Cardiology

## 2020-09-02 NOTE — Telephone Encounter (Signed)
New message    Pt would like a call about her ILR and her transmission

## 2020-09-02 NOTE — Telephone Encounter (Signed)
Returned patient's call about ILR alert. Patient states recent diagnosis of UTI and is currently taking antibiotics. Patient states that she feels like her heart is racing. Last transmission from 03/30-03/31 shows one tachycardia episode where heart rate was elevated to 167 beats for 16 seconds. Patient states that she is weak, not feeling well, and is sweating. ED precautions given to patient along with advice to contact primary physician. I advised that I would send the transmission  To Dr. Curt Bears for review. I stressed ED precautions in reference to signs and symptoms of chest pain, shortness of breath, dizziness.

## 2020-09-06 NOTE — Telephone Encounter (Signed)
Pt r/s to see Camnitz 4/11 to further discuss

## 2020-09-10 ENCOUNTER — Other Ambulatory Visit (HOSPITAL_COMMUNITY): Payer: Self-pay | Admitting: Nurse Practitioner

## 2020-09-10 DIAGNOSIS — A498 Other bacterial infections of unspecified site: Secondary | ICD-10-CM | POA: Diagnosis not present

## 2020-09-13 ENCOUNTER — Ambulatory Visit (INDEPENDENT_AMBULATORY_CARE_PROVIDER_SITE_OTHER): Payer: Medicare Other | Admitting: Cardiology

## 2020-09-13 ENCOUNTER — Encounter: Payer: Self-pay | Admitting: Cardiology

## 2020-09-13 ENCOUNTER — Other Ambulatory Visit: Payer: Self-pay

## 2020-09-13 VITALS — BP 146/80 | HR 88 | Ht 64.0 in | Wt 120.6 lb

## 2020-09-13 DIAGNOSIS — I480 Paroxysmal atrial fibrillation: Secondary | ICD-10-CM | POA: Diagnosis not present

## 2020-09-13 NOTE — Progress Notes (Signed)
Electrophysiology Office Note   Date:  09/13/2020   ID:  [name], DOB 1933/03/03, MRN [id number]  PCP:  Rita Melter, MD  Cardiologist:  [name] Primary Electrophysiologist:  [name] Rita Leeds, MD    Chief Complaint: Syncope   History of Present Illness: [name] is a 87 y.o. female who is being seen today for the evaluation of syncope at the request of Rita Melter, MD. Presenting today for electrophysiology evaluation.  She has a history of hypertension, paroxysmal atrial fibrillation, SVT, and syncope.  She presented emergency room 12/12/2019 with episodes of syncope.  She woke up with palpitations and chest tightness.  She was walking to the bathroom and woke up on the floor.  After the episode she had generalized weakness and chest tightness.  She is now status post Linq monitor implant.  She was initially tried on diltiazem as well as Multaq for her episodes of SVT atrial fibrillation but she did not tolerate either of these medications.  Today, denies symptoms of palpitations, chest pain, shortness of breath, orthopnea, PND, lower extremity edema, claudication, dizziness, presyncope, syncope, bleeding, or neurologic sequela. The patient is tolerating medications without difficulties.  She continues to have episodic palpitations.  Unfortunately she was diagnosed with a E. coli UTI that she was dealing with for 2 weeks.  She has quite a bit of fatigue due to that.  She does feel palpitations quite often, though this is not noted on Linq monitoring.   Past Medical History:  Diagnosis Date  . Arm numbness left   . Back pain   . Bronchitis, chronic ([place])   . Depression   . Dizziness   . Fatigue   . Hypertension    mild  . Palpitations   . Skin cancer   . Stress   . Syncope    History of    Past Surgical History:  Procedure Laterality Date  . APPENDECTOMY    . CESAREAN SECTION  [phone]     Current Outpatient Medications  Medication Sig  Dispense Refill  . Ascorbic Acid (VITAMIN C) 100 MG tablet Take 100 mg by mouth daily.    . cholecalciferol (VITAMIN D3) 25 MCG (1000 UNIT) tablet Take 1,000 Units by mouth daily.    Marland Kitchen diltiazem (CARDIZEM) 30 MG tablet Take 1 tablet (30 mg total) by mouth 4 (four) times daily as needed (for elevated heart rates). 30 tablet 2  . ELIQUIS 2.5 MG TABS tablet TAKE 1 TABLET(2.5 MG) BY MOUTH TWICE DAILY 60 tablet 3  . famotidine (PEPCID) 20 MG tablet One after supper 30 tablet 11  . folic acid (FOLVITE) 1 MG tablet Take 1 mg by mouth daily.    Marland Kitchen HYDROcodone-acetaminophen (NORCO/VICODIN) 5-325 MG tablet Take 1 tablet by mouth every 4 (four) hours as needed for moderate pain. 40 tablet 0  . levofloxacin (LEVAQUIN) 500 MG tablet Take 1 tablet (500 mg total) by mouth daily. 10 tablet 11  . Multiple Vitamins-Minerals (ONE-A-DAY WOMENS 50 PLUS PO) Take 1 tablet by mouth daily.    . nitrofurantoin, macrocrystal-monohydrate, (MACROBID) 100 MG capsule Take 100 mg by mouth 2 (two) times daily.    . pantoprazole (PROTONIX) 40 MG tablet Take 1 tablet (40 mg total) by mouth daily. Take 30-60 min before first meal of the day 30 tablet 2  . tetracycline (SUMYCIN) 500 MG capsule Take 500 mg by mouth daily.     No current facility-administered medications for this visit.    Allergies:   Ciprofloxacin, Penicillins, Sulfonamide  derivatives, Avelox [moxifloxacin], Multaq [dronedarone], Reclast [zoledronic acid], and Chlorpheniramine   Social History:  The patient  reports that she has never smoked. She has never used smokeless tobacco. She reports current alcohol use. She reports that she does not use drugs.   Family History:  The patient's family history includes Alcohol abuse in her father; Atrial fibrillation in her mother; Thyroid disease in her mother.   ROS:  Please see the history of present illness.   Otherwise, review of systems is positive for none.   All other systems are reviewed and negative.   PHYSICAL  EXAM: VS:  BP (!) 146/80   Pulse 88   Ht 5\' 4"  (1.626 m)   Wt 120 lb 9.6 oz (54.7 kg)   BMI 20.70 kg/m  , BMI Body mass index is 20.7 kg/m. GEN: Well nourished, well developed, in no acute distress  HEENT: normal  Neck: no JVD, carotid bruits, or masses Cardiac: RRR; no murmurs, rubs, or gallops,no edema  Respiratory:  clear to auscultation bilaterally, normal work of breathing GI: soft, nontender, nondistended, + BS MS: no deformity or atrophy  Skin: warm and dry, device site well healed Neuro:  Strength and sensation are intact Psych: euthymic mood, full affect  EKG:  EKG is ordered today. Personal review of the ekg ordered shows sinus rhythm, rate 88  Personal review of the device interrogation today. Results in [facility]: 09/15/2019: TSH 1.950 06/03/2020: BUN 20; Creatinine, Ser 1.19; Hemoglobin 14.1; Platelets 247; Potassium 4.7; Sodium 139    Lipid Panel     Component Value Date/Time   CHOL 166 01/09/2011 0455   TRIG 69 01/09/2011 0455   HDL 55 01/09/2011 0455   CHOLHDL 3.0 01/09/2011 0455   VLDL 14 01/09/2011 0455   LDLCALC 97 01/09/2011 0455     Wt Readings from Last 3 Encounters:  09/13/20 120 lb 9.6 oz (54.7 kg)  08/02/20 126 lb (57.2 kg)  07/05/20 125 lb (56.7 kg)      Other studies Reviewed: Additional studies/ records that were reviewed today include: Cardiac monitor 10/23/2019 personally reviewed Review of the above records today demonstrates:   Sinus rhythm including sinus bradycardia ( predominately at night )  Episodes of PSVT lasting between 6-8 seconds.    ASSESSMENT AND PLAN:  1.  Paroxysmal atrial fibrillation/SVT: CHA2DS2-VASc of 4.  Currently on Eliquis.  She has had minimal episodes.  Linq monitor shows 5 episodes all less than 18 seconds.  No changes at this time.  2.  Syncope: Status post Linq monitor.  No further episodes.  Current medicines are reviewed at length with the patient today.   The patient does not have  concerns regarding her medicines.  The following changes were made today: None  Labs/ tests ordered today include:  Orders Placed This Encounter  Procedures  . EKG 12-Lead     Disposition:   FU with Shiquita Collignon 6 months  Signed, Ludmilla Mcgillis Rita Leeds, MD  09/13/2020 4:52 PM     [address] [place] [address] [phone] (office) [phone] (fax)

## 2020-09-13 NOTE — Patient Instructions (Signed)
Medication Instructions:  Your physician recommends that you continue on your current medications as directed. Please refer to the Current Medication list given to you today.  *If you need a refill on your cardiac medications before your next appointment, please call your pharmacy*   Lab Work: None ordered If you have labs (blood work) drawn today and your tests are completely normal, you will receive your results only by: Marland Kitchen MyChart Message (if you have MyChart) OR . A paper copy in the mail If you have any lab test that is abnormal or we need to change your treatment, we will call you to review the results.   Testing/Procedures: None ordered   Follow-Up: At [facility], you and your health needs are our priority.  As part of our continuing mission to provide you with exceptional heart care, we have created designated Provider Care Teams.  These Care Teams include your primary Cardiologist (physician) and Advanced Practice Providers (APPs -  Physician Assistants and Nurse Practitioners) who all work together to provide you with the care you need, when you need it.  Remote monitoring is used to monitor your Pacemaker or ICD from home. This monitoring reduces the number of office visits required to check your device to one time per year. It allows [place] to keep an eye on the functioning of your device to ensure it is working properly. You are scheduled for a device check from home on 09/27/2020. You may send your transmission at any time that day. If you have a wireless device, the transmission will be sent automatically. After your physician reviews your transmission, you will receive a postcard with your next transmission date.  Your next appointment:   6 month(s)  The format for your next appointment:   In Person  Provider:   Allegra Lai, MD   Thank you for choosing [facility]!!   Trinidad Curet, RN [phone]    Other Instructions

## 2020-09-14 ENCOUNTER — Telehealth: Payer: Self-pay | Admitting: Cardiology

## 2020-09-14 ENCOUNTER — Ambulatory Visit: Payer: Medicare Other | Admitting: Internal Medicine

## 2020-09-14 NOTE — Telephone Encounter (Signed)
   Pt c/o medication issue:  1. Name of Medication: Eliquis  2. How are you currently taking this medication (dosage and times per day)?  2.5 mg 2x daily   3. Are you having a reaction (difficulty breathing--STAT)?   4. What is your medication issue? Patient went to the pharmacy yesterday and was given the 5 mg dose. Patient said she was not told about a dose change at her appointment yesterday and wanted to speak with Dr. Macky Lower Nurse Venida Jarvis

## 2020-09-15 NOTE — Telephone Encounter (Signed)
Pt aware she should be on the Eliquis 2.5 mg BID. She reports that the pharmacy would NOT take back the 5 mg tablets (after they said they would, since they refilled 5 mg tablets accidentally).  She is currently splitting the 5 mg in half and has plenty for now. She will call the office when she is ready to refill this medication. She appreciates my return call

## 2020-09-16 DIAGNOSIS — M25552 Pain in left hip: Secondary | ICD-10-CM | POA: Diagnosis not present

## 2020-09-21 ENCOUNTER — Ambulatory Visit: Payer: Medicare Other | Admitting: Allergy and Immunology

## 2020-09-23 LAB — CUP PACEART REMOTE DEVICE CHECK
Date Time Interrogation Session: 20220421000011
Implantable Pulse Generator Implant Date: 20210824

## 2020-09-27 ENCOUNTER — Ambulatory Visit (INDEPENDENT_AMBULATORY_CARE_PROVIDER_SITE_OTHER): Payer: Medicare Other

## 2020-09-27 DIAGNOSIS — R55 Syncope and collapse: Secondary | ICD-10-CM | POA: Diagnosis not present

## 2020-09-27 DIAGNOSIS — M5459 Other low back pain: Secondary | ICD-10-CM | POA: Diagnosis not present

## 2020-09-27 DIAGNOSIS — M25552 Pain in left hip: Secondary | ICD-10-CM | POA: Diagnosis not present

## 2020-10-05 ENCOUNTER — Encounter: Payer: Medicare Other | Admitting: Cardiology

## 2020-10-11 ENCOUNTER — Encounter: Payer: Self-pay | Admitting: Internal Medicine

## 2020-10-11 ENCOUNTER — Ambulatory Visit (INDEPENDENT_AMBULATORY_CARE_PROVIDER_SITE_OTHER): Payer: Medicare Other

## 2020-10-11 ENCOUNTER — Ambulatory Visit (INDEPENDENT_AMBULATORY_CARE_PROVIDER_SITE_OTHER): Payer: Medicare Other | Admitting: Internal Medicine

## 2020-10-11 ENCOUNTER — Other Ambulatory Visit: Payer: Self-pay

## 2020-10-11 DIAGNOSIS — R059 Cough, unspecified: Secondary | ICD-10-CM | POA: Diagnosis not present

## 2020-10-11 DIAGNOSIS — R058 Other specified cough: Secondary | ICD-10-CM

## 2020-10-11 DIAGNOSIS — J479 Bronchiectasis, uncomplicated: Secondary | ICD-10-CM

## 2020-10-11 NOTE — Patient Instructions (Addendum)
I recommend you go ahead and see  your allergist next   Please remember to go to the  x-ray department  for your tests - we will call you with the results when they are available     Please schedule a follow up visit in 3 months but call sooner if needed

## 2020-10-11 NOTE — Progress Notes (Signed)
Subjective:    Patient ID: [name], female    DOB: [birth year]    MRN: [id number]    Primary Provider/Referring Provider: Dr Doyle Askew   Brief patient profile:  87 yowf never smoker/ MM   with recurrent cough since age 87 with right middle lobe/lingular syndrome with limited bronchiectatic changes on  CT study dated 09/2005    History of Present Illness    03/01/2016  f/u ov/Casha Estupinan re:  Bronchiectasis with uacs/ some better on gerd rx  Chief Complaint  Patient presents with  . Follow-up    Pt states her dry cough is unchanged since last OV. Pt states she has ocassional chest tightness and SOB but attributes this to stress. Pt also c/o hoarseness.    coughs up to an hour each am, completely non-productive even with flutter  rec No change in medications Nasty mucus > doxycline 100 mg twice daily x 10 days   Late add for nodular opacity RUL c/w MAI    zmax x 250 mg daily x 30 days then recheck cxr      02/03/2020  f/u ov/Shaniqwa Horsman re: bronchiectasis  Chief Complaint  Patient presents with  . Follow-up    SOB and fatigue  Dyspnea:  Broke foot  When fell so no regular wallking now Cough: white, worse in am 's Sleeping: bed flat, 2 pillows  SABA use: none  02: none rec thyroid nodule f/u gerkin   08/02/2020  f/u ov/Swanson Farnell re: bronchiectasis, not following action plan/ no longer on gerd rx  Chief Complaint  Patient presents with  . Follow-up    Chest pain and productive cough  Dyspnea:  Steps one way up / heart pounds when go  Cough: changed colors in am x 6 weeks assoc with honking,not using mucinex, never started levaquin positonal Chest pain under the left breast x years but for the last 6 weeks worse with cough Sleeping: flat bed with 2 pillows - worse x 6 weeks due to increaseing cough  SABA use: none  02: none  Covid status:   vax x3  rec For cough/congestion >>>   mucinex dm is up 1200 mg every 12 hours and use the flutter   For nasty mucus >  levaquin 500 mg one daily x  10 days Pantoprazole (protonix) 40 mg   Take  30-60 min before first meal of the day and Pepcid (famotidine)  20 mg one after Supper  until return to office - this is the best way to tell whether stomach acid is contributing to your problem.   GERD diet  We will be calling you for allergy evaluation >>> not done as of 10/11/2020  cxr >> no change     10/11/2020  f/u ov/Elior Robinette re: bronchiectasis / s/p macrodantin for UTI and "not right since"  Chief Complaint  Patient presents with  . Follow-up    Increased cough for the past couple of months- prod with white sputum.   Dyspnea:  More weak than sob limiting activty  Cough:white sputum / first thing am  Then worse with talking or taking deep breath Sleeping: bed is flat/ 2 pillows and eventually quiets down  SABA use: none  02: none  Covid status:   vax x 2 pfizer     No obvious day to day or daytime variability or assoc excess/ purulent sputum or mucus plugs or hemoptysis or cp or chest tightness, subjective wheeze or overt sinus or hb symptoms.   Sleeping  without nocturnal  exacerbation  of respiratory  c/o's or need for noct saba. Also denies any obvious fluctuation of symptoms with weather or environmental changes or other aggravating or alleviating factors except as outlined above   No unusual exposure hx or h/o childhood pna/ asthma or knowledge of premature birth.  Current Allergies, Complete Past Medical History, Past Surgical History, Family History, and Social History were reviewed in [employer] record.  ROS  The following are not active complaints unless bolded Hoarseness, sore throat, dysphagia, dental problems, itching, sneezing,  nasal congestion or discharge of excess mucus or purulent secretions, ear ache,   fever, chills, sweats, unintended wt loss or wt gain, classically pleuritic or exertional cp,  orthopnea pnd or arm/hand swelling  or leg swelling, presyncope, palpitations, abdominal pain, anorexia,  nausea, vomiting, diarrhea  or change in bowel habits or change in bladder habits, change in stools or change in urine, dysuria, hematuria,  rash, arthralgias, visual complaints, headache, numbness, weakness or ataxia or problems with walking or coordination,  change in mood or  memory.        Current Meds  Medication Sig  . Ascorbic Acid (VITAMIN C) 100 MG tablet Take 100 mg by mouth daily.  . cholecalciferol (VITAMIN D3) 25 MCG (1000 UNIT) tablet Take 1,000 Units by mouth daily.  Marland Kitchen diltiazem (CARDIZEM) 30 MG tablet Take 1 tablet (30 mg total) by mouth 4 (four) times daily as needed (for elevated heart rates).  Marland Kitchen ELIQUIS 2.5 MG TABS tablet TAKE 1 TABLET(2.5 MG) BY MOUTH TWICE DAILY  . famotidine (PEPCID) 20 MG tablet One after supper  . folic acid (FOLVITE) 1 MG tablet Take 1 mg by mouth daily.  Marland Kitchen HYDROcodone-acetaminophen (NORCO/VICODIN) 5-325 MG tablet Take 1 tablet by mouth every 4 (four) hours as needed for moderate pain.  . Multiple Vitamins-Minerals (ONE-A-DAY WOMENS 50 PLUS PO) Take 1 tablet by mouth daily.  . pantoprazole (PROTONIX) 40 MG tablet Take 1 tablet (40 mg total) by mouth daily. Take 30-60 min before first meal of the day                  Past Medical History:  BRONCHIECTASIS (ICD-494.0) see CT scan of the chest dated 09/29/2005  - Pneumovax April 28, 2009 (over 65 so last one)  Prevnar given 04/27/2014   - Alpha one Screen  August 18, 2010 = MM  - Sinus CT August 18, 2010 >>  neg  COUGH, CHRONIC (ICD-786.2)  Left Kidney Cyst  - 04/21/09 [facility] PROCEDURE(S): ULTRASOUND GUIDED AND FLUOROSCOPIC GUIDED LEFT RENAL CYST ASPIRATION AND SCLEROSIS  LUQ Abd pain 2011...............................................................Marland KitchenSchooler  - CT ABD 07/29/10 no etiology        Objective:   Physical Exam  10/11/2020           118 08/02/2020         126   02/03/2020        124  Wt  09/19/2019  120  07/22/2018         122  05/20/2018       129  11/30/2017         131  March  15,2012 137      Vital signs reviewed  10/11/2020  - Note at rest 02 sats  97% on RA   General appearance:    Chronically ill amb thin wf freq throat clearing   HEENT : pt wearing mask not removed for exam due to covid - 19 concerns.   NECK :  without JVD/Nodes/TM/ nl carotid upstrokes bilaterally   LUNGS: no acc muscle use,  Min barrel  contour chest wall with bilateral  slightly decreased bs s audible wheeze and  With cough on insp  maneuvers    CV:  RRR  no s3 or murmur or increase in P2, and no edema   ABD:  soft and nontender with pos end  insp Hoover's  in the supine position. No bruits or organomegaly appreciated, bowel sounds nl  MS:   Nl gait/  ext warm without deformities, calf tenderness, cyanosis or clubbing No obvious joint restrictions   SKIN: warm and dry without lesions    NEURO:  alert, approp, nl sensorium with  no motor or cerebellar deficits apparent.    CXR PA and Lateral:   10/11/2020 :    I personally reviewed images and agree with radiology impression as follows:   Chronic bronchiectasis and similar appearing right nodularity. Potential new nodule within the left mid lung which may be inflammatory or infectious but short interval radiographic follow-up is recommended. My impression: no evidence "macrodantin lung"  (already advised to avoid based on poor tolerance)            Assessment & Plan:

## 2020-10-13 ENCOUNTER — Encounter: Payer: Self-pay | Admitting: Internal Medicine

## 2020-10-13 NOTE — Assessment & Plan Note (Signed)
Onset ? Age 87 wit bronchiectasis first confirmed 2007  - Alpha one Screen  August 18, 2010 = MM  - See CT Chest 09/29/05 Stable bronchiectasis in right middle lobe and lingula.  Scattered tree in bud opacities throughout the lungs, right greater than left,  nonspecific.   - PFT's 06/21/2012 1.67 (91%) ratio 64 and no change,  DLCO 77% - Flutter valve added 04/27/2014  - PFTs 05/24/15   FEV1 1.55 (81%) ratio 69 p 6% improvement from saba and dlco 62% ratio 88%  - HRCT 04/04/16 >>> 1. 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 2. An incidental finding of potential clinical significance has been found. Dominant nodule in the right upper lobe, likely infectious/inflammatory in etiology.  - Zmax 250 mg daily 03/01/16 - 05/01/16 improved cough at d/c but diarrhea while on it and no change in nodule RUL > d/c 07/07/16 - 08/28/2017 added back cycles of zpak prn flare of purulent bronchitis > d/c 05/20/2018  - levaquin 500 x 10 days prn 06/11/2018 - flutter valve training 06/24/18 Chest CT 09/09/2018 Increased tree-in-bud nodularity in right lower lobe since 2016 -10/11/2020  new L lung mid nodule noted and likely inflammatory   Clinically no progression, minimal changes on cxr just needs f/u cxr in 3 m, no change in rx warranted  Discussed in detail all the  indications, usual  risks and alternatives  relative to the benefits with patient who agrees to proceed with conservative f/u as outlined

## 2020-10-13 NOTE — Assessment & Plan Note (Signed)
Trial of max gerd rx 11/24/2015 >> worse off rx 03/29/2016 and 08/28/2017 > resume ppi qam ac> resolved as of 11/30/2017   - Flared again 04/2018 > rechallenged 06/11/2018 and did not follow instructions> resolved 07/22/2018  - worse again 09/19/2019 rec increase pepcid to 20 mg bid and diet/bed blocks, f/u ent prn ([facility]) - 08/08/91 referred to allergy   Upper airway cough syndrome (previously labeled PNDS),  is so named because it's frequently impossible to sort out how much is  CR/sinusitis with freq throat clearing (which can be related to primary GERD)   vs  causing  secondary (" extra esophageal")  GERD from wide swings in gastric pressure that occur with throat clearing, often  promoting self use of mint and menthol lozenges that reduce the lower esophageal sphincter tone and exacerbate the problem further in a cyclical fashion.   These are the same pts (now being labeled as having "irritable larynx syndrome" by some cough centers) who not infrequently have a history of having failed to tolerate ace inhibitors,  dry powder inhalers or biphosphonates or report having atypical/extraesophageal reflux symptoms that don't respond to standard doses of PPI  and are easily confused as having aecopd or asthma flares by even experienced allergists/ pulmonologists (myself included).   >>> referred to allergy/ rec continue max gerd rx including bed blocks         Each maintenance medication was reviewed in detail including emphasizing most importantly the difference between maintenance and prns and under what circumstances the prns are to be triggered using an action plan format where appropriate.  Total time for H and P, chart review, counseling,   and generating customized AVS unique to this office visit / same day charting = 25 min

## 2020-10-14 ENCOUNTER — Telehealth: Payer: Self-pay | Admitting: Cardiology

## 2020-10-14 NOTE — Telephone Encounter (Signed)
Pt c/o medication issue:  1. Name of Medication: Mucinex DM 1200mg   2. How are you currently taking this medication (dosage and times per day)? As directed  3. Are you having a reaction (difficulty breathing--STAT)? yes  4. What is your medication issue? Patient was put on this medication by her pulmonary doctor and she had a nose bleed last night. She was told to contact Dr. Curt Bears office in regards to this. Please advise.

## 2020-10-14 NOTE — Progress Notes (Signed)
Spoke with pt and notified of results per Dr. Wert. Pt verbalized understanding and denied any questions. 

## 2020-10-14 NOTE — Telephone Encounter (Signed)
Pt reports having a nose bleed last night after going to bed.   States it was not major, didn't bleed long, gone after about "8 tissues". Pt advised to continue monitoring for now.   Advised to call office if continues/worsens/becomes more frequent and/or bleeding gums, blood in urine/stool, etc. Patient verbalized understanding and agreeable to plan.

## 2020-10-14 NOTE — Progress Notes (Signed)
Carelink Summary Report / Loop Recorder 

## 2020-10-22 DIAGNOSIS — R300 Dysuria: Secondary | ICD-10-CM | POA: Diagnosis not present

## 2020-10-23 ENCOUNTER — Emergency Department (HOSPITAL_COMMUNITY): Payer: Medicare Other

## 2020-10-23 ENCOUNTER — Inpatient Hospital Stay (HOSPITAL_COMMUNITY)
Admission: EM | Admit: 2020-10-23 | Discharge: 2020-10-26 | DRG: 690 | Disposition: A | Discharge status: Home / Self Care | Payer: Medicare Other | Attending: Internal Medicine | Admitting: Internal Medicine

## 2020-10-23 ENCOUNTER — Other Ambulatory Visit: Payer: Self-pay

## 2020-10-23 ENCOUNTER — Encounter (HOSPITAL_COMMUNITY): Payer: Self-pay

## 2020-10-23 DIAGNOSIS — R0902 Hypoxemia: Secondary | ICD-10-CM | POA: Diagnosis not present

## 2020-10-23 DIAGNOSIS — I480 Paroxysmal atrial fibrillation: Secondary | ICD-10-CM | POA: Diagnosis present

## 2020-10-23 DIAGNOSIS — N3289 Other specified disorders of bladder: Secondary | ICD-10-CM | POA: Diagnosis not present

## 2020-10-23 DIAGNOSIS — Z880 Allergy status to penicillin: Secondary | ICD-10-CM | POA: Diagnosis not present

## 2020-10-23 DIAGNOSIS — Z881 Allergy status to other antibiotic agents status: Secondary | ICD-10-CM

## 2020-10-23 DIAGNOSIS — N281 Cyst of kidney, acquired: Secondary | ICD-10-CM | POA: Diagnosis not present

## 2020-10-23 DIAGNOSIS — R911 Solitary pulmonary nodule: Secondary | ICD-10-CM | POA: Diagnosis not present

## 2020-10-23 DIAGNOSIS — Z7901 Long term (current) use of anticoagulants: Secondary | ICD-10-CM | POA: Diagnosis not present

## 2020-10-23 DIAGNOSIS — Z8349 Family history of other endocrine, nutritional and metabolic diseases: Secondary | ICD-10-CM | POA: Diagnosis not present

## 2020-10-23 DIAGNOSIS — M1990 Unspecified osteoarthritis, unspecified site: Secondary | ICD-10-CM | POA: Diagnosis present

## 2020-10-23 DIAGNOSIS — K219 Gastro-esophageal reflux disease without esophagitis: Secondary | ICD-10-CM | POA: Diagnosis present

## 2020-10-23 DIAGNOSIS — Z882 Allergy status to sulfonamides status: Secondary | ICD-10-CM

## 2020-10-23 DIAGNOSIS — Z85828 Personal history of other malignant neoplasm of skin: Secondary | ICD-10-CM

## 2020-10-23 DIAGNOSIS — R61 Generalized hyperhidrosis: Secondary | ICD-10-CM | POA: Diagnosis not present

## 2020-10-23 DIAGNOSIS — R059 Cough, unspecified: Secondary | ICD-10-CM | POA: Diagnosis not present

## 2020-10-23 DIAGNOSIS — F32A Depression, unspecified: Secondary | ICD-10-CM | POA: Diagnosis present

## 2020-10-23 DIAGNOSIS — J42 Unspecified chronic bronchitis: Secondary | ICD-10-CM | POA: Diagnosis present

## 2020-10-23 DIAGNOSIS — Z20822 Contact with and (suspected) exposure to covid-19: Secondary | ICD-10-CM | POA: Diagnosis present

## 2020-10-23 DIAGNOSIS — K7689 Other specified diseases of liver: Secondary | ICD-10-CM | POA: Diagnosis not present

## 2020-10-23 DIAGNOSIS — J479 Bronchiectasis, uncomplicated: Secondary | ICD-10-CM | POA: Diagnosis not present

## 2020-10-23 DIAGNOSIS — R000 Tachycardia, unspecified: Secondary | ICD-10-CM | POA: Diagnosis not present

## 2020-10-23 DIAGNOSIS — Z1624 Resistance to multiple antibiotics: Secondary | ICD-10-CM | POA: Diagnosis present

## 2020-10-23 DIAGNOSIS — R630 Anorexia: Secondary | ICD-10-CM | POA: Diagnosis present

## 2020-10-23 DIAGNOSIS — N10 Acute tubulo-interstitial nephritis: Secondary | ICD-10-CM | POA: Diagnosis present

## 2020-10-23 DIAGNOSIS — Z79899 Other long term (current) drug therapy: Secondary | ICD-10-CM

## 2020-10-23 DIAGNOSIS — Z888 Allergy status to other drugs, medicaments and biological substances status: Secondary | ICD-10-CM

## 2020-10-23 DIAGNOSIS — N390 Urinary tract infection, site not specified: Secondary | ICD-10-CM | POA: Diagnosis not present

## 2020-10-23 DIAGNOSIS — R0689 Other abnormalities of breathing: Secondary | ICD-10-CM | POA: Diagnosis not present

## 2020-10-23 DIAGNOSIS — Z811 Family history of alcohol abuse and dependence: Secondary | ICD-10-CM

## 2020-10-23 DIAGNOSIS — R509 Fever, unspecified: Secondary | ICD-10-CM | POA: Diagnosis not present

## 2020-10-23 DIAGNOSIS — I10 Essential (primary) hypertension: Secondary | ICD-10-CM | POA: Diagnosis present

## 2020-10-23 DIAGNOSIS — N12 Tubulo-interstitial nephritis, not specified as acute or chronic: Secondary | ICD-10-CM | POA: Diagnosis present

## 2020-10-23 LAB — CBC WITH DIFFERENTIAL/PLATELET
Abs Immature Granulocytes: 0.11 10*3/uL — ABNORMAL HIGH (ref 0.00–0.07)
Basophils Absolute: 0.1 10*3/uL (ref 0.0–0.1)
Basophils Relative: 0 %
Eosinophils Absolute: 0 10*3/uL (ref 0.0–0.5)
Eosinophils Relative: 0 %
HCT: 39.8 % (ref 36.0–46.0)
Hemoglobin: 13.4 g/dL (ref 12.0–15.0)
Immature Granulocytes: 1 %
Lymphocytes Relative: 3 %
Lymphs Abs: 0.4 10*3/uL — ABNORMAL LOW (ref 0.7–4.0)
MCH: 31.5 pg (ref 26.0–34.0)
MCHC: 33.7 g/dL (ref 30.0–36.0)
MCV: 93.4 fL (ref 80.0–100.0)
Monocytes Absolute: 0.5 10*3/uL (ref 0.1–1.0)
Monocytes Relative: 4 %
Neutro Abs: 11.8 10*3/uL — ABNORMAL HIGH (ref 1.7–7.7)
Neutrophils Relative %: 92 %
Platelets: 261 10*3/uL (ref 150–400)
RBC: 4.26 MIL/uL (ref 3.87–5.11)
RDW: 13.9 % (ref 11.5–15.5)
WBC: 12.8 10*3/uL — ABNORMAL HIGH (ref 4.0–10.5)
nRBC: 0 % (ref 0.0–0.2)

## 2020-10-23 LAB — URINALYSIS, ROUTINE W REFLEX MICROSCOPIC
Bilirubin Urine: NEGATIVE
Glucose, UA: NEGATIVE mg/dL
Hgb urine dipstick: NEGATIVE
Ketones, ur: 20 mg/dL — AB
Nitrite: NEGATIVE
Protein, ur: NEGATIVE mg/dL
Specific Gravity, Urine: 1.006 (ref 1.005–1.030)
pH: 7 (ref 5.0–8.0)

## 2020-10-23 LAB — COMPREHENSIVE METABOLIC PANEL
ALT: 14 U/L (ref 0–44)
AST: 17 U/L (ref 15–41)
Albumin: 3 g/dL — ABNORMAL LOW (ref 3.5–5.0)
Alkaline Phosphatase: 44 U/L (ref 38–126)
Anion gap: 7 (ref 5–15)
BUN: 14 mg/dL (ref 8–23)
CO2: 22 mmol/L (ref 22–32)
Calcium: 8.2 mg/dL — ABNORMAL LOW (ref 8.9–10.3)
Chloride: 106 mmol/L (ref 98–111)
Creatinine, Ser: 0.7 mg/dL (ref 0.44–1.00)
GFR, Estimated: >60 mL/min (ref >60)
Glucose, Bld: 110 mg/dL — ABNORMAL HIGH (ref 70–99)
Potassium: 3.5 mmol/L (ref 3.5–5.1)
Sodium: 135 mmol/L (ref 135–145)
Total Bilirubin: 0.5 mg/dL (ref 0.3–1.2)
Total Protein: 5.9 g/dL — ABNORMAL LOW (ref 6.5–8.1)

## 2020-10-23 LAB — PROTIME-INR
INR: 1.1 (ref 0.8–1.2)
Prothrombin Time: 14.6 seconds (ref 11.4–15.2)

## 2020-10-23 LAB — APTT: aPTT: 32 seconds (ref 24–36)

## 2020-10-23 LAB — LACTIC ACID, PLASMA: Lactic Acid, Venous: 1.1 mmol/L (ref 0.5–1.9)

## 2020-10-23 LAB — RESP PANEL BY RT-PCR (FLU A&B, COVID) ARPGX2
Influenza A by PCR: NEGATIVE
Influenza B by PCR: NEGATIVE
SARS Coronavirus 2 by RT PCR: NEGATIVE

## 2020-10-23 MED ORDER — HYDROCODONE-ACETAMINOPHEN 5-325 MG PO TABS
1.0000 | ORAL_TABLET | Freq: Four times a day (QID) | ORAL | Status: DC | PRN
  Administered 2020-10-23 – 2020-10-25 (×4): 1 via ORAL
  Filled 2020-10-23 (×4): qty 1

## 2020-10-23 MED ORDER — PANTOPRAZOLE SODIUM 40 MG PO TBEC
40.0000 mg | DELAYED_RELEASE_TABLET | Freq: Every day | ORAL | Status: DC
  Administered 2020-10-24 – 2020-10-26 (×3): 40 mg via ORAL
  Filled 2020-10-23 (×3): qty 1

## 2020-10-23 MED ORDER — SODIUM CHLORIDE 0.9 % IV SOLN
1.0000 g | Freq: Once | INTRAVENOUS | Status: AC
  Administered 2020-10-23: 1 g via INTRAVENOUS
  Filled 2020-10-23: qty 1

## 2020-10-23 MED ORDER — DILTIAZEM HCL 30 MG PO TABS
30.0000 mg | ORAL_TABLET | Freq: Four times a day (QID) | ORAL | Status: DC | PRN
  Administered 2020-10-24: 30 mg via ORAL
  Filled 2020-10-23: qty 1

## 2020-10-23 MED ORDER — FOLIC ACID 1 MG PO TABS
1.0000 mg | ORAL_TABLET | Freq: Every day | ORAL | Status: DC
  Administered 2020-10-24 – 2020-10-26 (×3): 1 mg via ORAL
  Filled 2020-10-23 (×3): qty 1

## 2020-10-23 MED ORDER — APIXABAN 2.5 MG PO TABS
2.5000 mg | ORAL_TABLET | Freq: Two times a day (BID) | ORAL | Status: DC
  Administered 2020-10-23 – 2020-10-26 (×6): 2.5 mg via ORAL
  Filled 2020-10-23 (×6): qty 1

## 2020-10-23 MED ORDER — POLYETHYLENE GLYCOL 3350 17 G PO PACK: 17.0000 g | PACK | Freq: Every day | ORAL | Status: DC | PRN

## 2020-10-23 MED ORDER — MELATONIN 5 MG PO TABS: 10.0000 mg | ORAL_TABLET | Freq: Every evening | ORAL | Status: DC | PRN

## 2020-10-23 MED ORDER — SODIUM CHLORIDE 0.9 % IV SOLN: 1.0000 g | Freq: Three times a day (TID) | INTRAVENOUS | Status: DC

## 2020-10-23 MED ORDER — FAMOTIDINE 20 MG PO TABS
20.0000 mg | ORAL_TABLET | Freq: Every day | ORAL | Status: DC
  Administered 2020-10-24 – 2020-10-25 (×2): 20 mg via ORAL
  Filled 2020-10-23 (×4): qty 1

## 2020-10-23 MED ORDER — ACETAMINOPHEN 650 MG RE SUPP: 650.0000 mg | Freq: Four times a day (QID) | RECTAL | Status: DC | PRN

## 2020-10-23 MED ORDER — ACETAMINOPHEN 325 MG PO TABS
650.0000 mg | ORAL_TABLET | Freq: Four times a day (QID) | ORAL | Status: DC | PRN
  Administered 2020-10-24 (×2): 650 mg via ORAL
  Filled 2020-10-23 (×2): qty 2

## 2020-10-23 MED ORDER — ONDANSETRON HCL 4 MG PO TABS: 4.0000 mg | ORAL_TABLET | Freq: Four times a day (QID) | ORAL | Status: DC | PRN

## 2020-10-23 MED ORDER — LACTATED RINGERS IV SOLN: INTRAVENOUS | Status: AC

## 2020-10-23 MED ORDER — ONDANSETRON HCL 4 MG/2ML IJ SOLN: 4.0000 mg | Freq: Four times a day (QID) | INTRAMUSCULAR | Status: DC | PRN

## 2020-10-23 NOTE — ED Provider Notes (Signed)
[facility] DEPT Provider Note   CSN: [id number] Arrival date & time: 10/23/20  1642     History Chief Complaint  Patient presents with  . Fever  . UTI  . Weakness  . Cough    [name] is a 87 y.o. female.  The history is provided by the patient and medical records.  Fever Associated symptoms: cough   Weakness Associated symptoms: cough and fever   Cough Associated symptoms: fever    [name] is a 87 y.o. female who presents to the Emergency Department complaining of possible sepsis. She presents the emergency department by EMS for evaluation of possible infection. She has a history of resistant E. coli UTI. In March she was treated with two rounds of antibiotics. On Wednesday she developed recurrent dysuria and went to her PCPs office. Yesterday she was started on Macrobid when her urinalysis was concerning for recurrent infection. She started feeling worse last night with fevers to 101.5, chills and generalized weakness. She lives alone at home and has been unable to feel safe walking down the stairs secondary to generalized weakness. She also has a chronic cough and feels this is been worse over the last 24 hours.  Urine culture from her PCPs office, [facility] dated August 30, 2020 with multidrug-resistant E. coli. Sensitive to her to pet him, Lesle Reek him, Mirapex him, Macrobid, Zosyn, tetracycline, Bactrim. All other antibiotics show resistance.    Past Medical History:  Diagnosis Date  . Arm numbness left   . Back pain   . Bronchitis, chronic ([place])   . Depression   . Dizziness   . Fatigue   . Hypertension    mild  . Palpitations   . Skin cancer   . Stress   . Syncope    History of     Patient Active Problem List   Diagnosis Date Noted  . Acute UTI 10/23/2020  . AF (paroxysmal atrial fibrillation) ([place]) 10/23/2020  . GERD without esophagitis 10/23/2020  . Tachycardia 09/02/2018  . Chest pain 08/23/2018   . Acute bronchitis 05/07/2018  . Thyroid nodule 04/06/2016  . Upper airway cough syndrome 11/24/2015  . Near syncope 03/02/2015  . Mastoiditis of both sides 08/10/2014  . Palpitations   . Syncope   . Dizziness   . Hypertension   . Back pain   . Depression   . Stress   . Fatigue   . Arm numbness left   . Skin cancer   . Bronchitis, chronic ([place])   . Obstructive bronchiectasis ([place]) 08/28/2007    Past Surgical History:  Procedure Laterality Date  . APPENDECTOMY    . CESAREAN SECTION  [phone]     OB History   No obstetric history on file.     Family History  Problem Relation Age of Onset  . Atrial fibrillation Mother   . Thyroid disease Mother   . Alcohol abuse Father     Social History   Tobacco Use  . Smoking status: Never Smoker  . Smokeless tobacco: Never Used  Vaping Use  . Vaping Use: Never used  Substance Use Topics  . Alcohol use: Yes    Comment: social ETOH  . Drug use: No    Home Medications Prior to Admission medications   Medication Sig Start Date End Date Taking? Authorizing Provider  acetaminophen (TYLENOL) 500 MG tablet Take 1,000 mg by mouth every 6 (six) hours as needed for fever.   Yes [provider]  Ascorbic Acid (VITAMIN C) 100 MG tablet Take 100 mg by mouth daily.   Yes [provider]  cholecalciferol (VITAMIN D3) 25 MCG (1000 UNIT) tablet Take 1,000 Units by mouth daily.   Yes [provider]  Dextromethorphan-guaiFENesin (MUCINEX DM) 30-600 MG TB12 Take 1 tablet by mouth daily.   Yes [provider]  diltiazem (CARDIZEM) 30 MG tablet Take 1 tablet (30 mg total) by mouth 4 (four) times daily as needed (for elevated heart rates). 07/05/20  Yes Camnitz, Will Hassell Done, MD  ELIQUIS 2.5 MG TABS tablet TAKE 1 TABLET(2.5 MG) BY MOUTH TWICE DAILY Patient taking differently: Take 2.5 mg by mouth 2 (two) times daily. 09/10/20  Yes Sherran Needs, NP  famotidine (PEPCID) 20 MG tablet One after  supper Patient taking differently: Take 20 mg by mouth daily. One after supper 08/02/20  Yes Tanda Rockers, MD  folic acid (FOLVITE) 1 MG tablet Take 1 mg by mouth daily.   Yes [provider]  HYDROcodone-acetaminophen (NORCO/VICODIN) 5-325 MG tablet Take 1 tablet by mouth every 4 (four) hours as needed for moderate pain. Patient taking differently: Take 1 tablet by mouth at bedtime. 06/24/18  Yes Tanda Rockers, MD  levofloxacin (LEVAQUIN) 500 MG tablet Take 1 tablet (500 mg total) by mouth daily. Patient taking differently: Take 500 mg by mouth daily as needed (cough). 08/02/20  Yes Tanda Rockers, MD  Multiple Vitamins-Minerals (ONE-A-DAY WOMENS 50 PLUS PO) Take 1 tablet by mouth daily.   Yes [provider]  nitrofurantoin, macrocrystal-monohydrate, (MACROBID) 100 MG capsule Take 100 mg by mouth 2 (two) times daily. Start date : 10/22/20 10/22/20  Yes [provider]  pantoprazole (PROTONIX) 40 MG tablet Take 1 tablet (40 mg total) by mouth daily. Take 30-60 min before first meal of the day 08/02/20  Yes Tanda Rockers, MD    Allergies    Ciprofloxacin, Penicillins, Sulfonamide derivatives, Apixaban, Avelox [moxifloxacin], Band-aid infection defense [bacitracin-polymyxin b], Mannitol, Multaq [dronedarone], Nitrofurantoin monohyd macro, Other, Prednisone, Reclast [zoledronic acid], Sulfa antibiotics, and Chlorpheniramine  Review of Systems   Review of Systems  Constitutional: Positive for fever.  Respiratory: Positive for cough.   Neurological: Positive for weakness.  All other systems reviewed and are negative.   Physical Exam Updated Vital Signs BP (!) 170/81   Pulse (!) 108   Temp 98.2 F (36.8 C) (Oral)   Resp (!) 22   SpO2 91%   Physical Exam Vitals and nursing note reviewed.  Constitutional:      Appearance: She is well-developed.  HENT:     Head: Normocephalic and atraumatic.  Cardiovascular:     Rate and Rhythm: Normal rate and regular  rhythm.     Heart sounds: No murmur heard.   Pulmonary:     Effort: Pulmonary effort is normal. No respiratory distress.     Breath sounds: Normal breath sounds.  Abdominal:     Palpations: Abdomen is soft.     Tenderness: There is no abdominal tenderness. There is no guarding or rebound.  Musculoskeletal:        General: No swelling or tenderness.  Skin:    General: Skin is warm and dry.  Neurological:     Mental Status: She is alert and oriented to person, place, and time.  Psychiatric:        Behavior: Behavior normal.     ED Results / Procedures / Treatments   Labs (all labs ordered are listed, but only abnormal results are  displayed) Labs Reviewed  COMPREHENSIVE METABOLIC PANEL - Abnormal; Notable for the following components:      Result Value   Glucose, Bld 110 (*)    Calcium 8.2 (*)    Total Protein 5.9 (*)    Albumin 3.0 (*)    All other components within normal limits  CBC WITH DIFFERENTIAL/PLATELET - Abnormal; Notable for the following components:   WBC 12.8 (*)    Neutro Abs 11.8 (*)    Lymphs Abs 0.4 (*)    Abs Immature Granulocytes 0.11 (*)    All other components within normal limits  URINALYSIS, ROUTINE W REFLEX MICROSCOPIC - Abnormal; Notable for the following components:   Ketones, ur 20 (*)    Leukocytes,Ua SMALL (*)    Bacteria, UA RARE (*)    All other components within normal limits  RESP PANEL BY RT-PCR (FLU A&B, COVID) ARPGX2  URINE CULTURE  CULTURE, BLOOD (ROUTINE X 2)  CULTURE, BLOOD (ROUTINE X 2)  LACTIC ACID, PLASMA  PROTIME-INR  APTT  LACTIC ACID, PLASMA  COMPREHENSIVE METABOLIC PANEL  CBC WITH DIFFERENTIAL/PLATELET  MAGNESIUM    EKG EKG Interpretation  Date/Time:  Saturday Oct 23 2020 18:07:13 EDT Ventricular Rate:  83 PR Interval:  169 QRS Duration: 97 QT Interval:  369 QTC Calculation: 434 R Axis:   84 Text Interpretation: Sinus rhythm Consider right ventricular hypertrophy When compared with ECG of 06/03/2020, No  significant change was found Confirmed by Delora Fuel ([id number]) on 10/23/2020 10:54:12 PM   Radiology DG Chest 2 View  Result Date: 10/23/2020 CLINICAL DATA:  Fever, cough EXAM: CHEST - 2 VIEW COMPARISON:  Chest radiograph from earlier today. FINDINGS: Loop recorder overlies the left heart. Stable cardiomediastinal silhouette with normal heart size. No pneumothorax. No pleural effusion. Chronic patchy nodular opacities in right mid lung are unchanged. Irregular tram track lucencies in both lungs compatible with known bronchiectasis. No superimposed acute consolidative airspace disease. IMPRESSION: 1. No acute cardiopulmonary disease. 2. Chronic patchy nodular opacities in the right mid lung and known chronic bronchiectasis, unchanged. Electronically Signed   By: Ilona Sorrel M.D.   On: 10/23/2020 18:33   DG Chest Port 1 View  Result Date: 10/23/2020 CLINICAL DATA:  Cough, fever EXAM: PORTABLE CHEST 1 VIEW COMPARISON:  10/11/2020 FINDINGS: Stable cardiomediastinal contours. Loop recorder device is present. Redemonstrated scattered areas of bronchiectasis and mucous plugging, most pronounced within the right mid lung. Previously seen left mid lung nodule appears less conspicuous compared to the previous study. No new focal airspace consolidation. No pleural effusion. Probable skin fold overlying the peripheral aspect of the right mid lung. Otherwise, no evidence of pneumothorax. IMPRESSION: 1. Probable skin fold overlying the peripheral aspect of the right mid lung. Consider repeat study after patient repositioning to exclude the possibility of a small pneumothorax. 2. Chronic scattered areas of bronchiectasis and mucous plugging. No new focal airspace consolidation. 3. Previously seen left mid lung nodule appears less conspicuous compared to the previous study. Electronically Signed   By: Davina Poke D.O.   On: 10/23/2020 17:59    Procedures Procedures   Medications Ordered in ED Medications   diltiazem (CARDIZEM) tablet 30 mg (has no administration in time range)  famotidine (PEPCID) tablet 20 mg (has no administration in time range)  pantoprazole (PROTONIX) EC tablet 40 mg (has no administration in time range)  apixaban (ELIQUIS) tablet 2.5 mg (2.5 mg Oral Given 1/60/73 7106)  folic acid (FOLVITE) tablet 1 mg (has no administration in time range)  acetaminophen (  TYLENOL) tablet 650 mg (has no administration in time range)    Or  acetaminophen (TYLENOL) suppository 650 mg (has no administration in time range)  polyethylene glycol (MIRALAX / GLYCOLAX) packet 17 g (has no administration in time range)  ondansetron (ZOFRAN) tablet 4 mg (has no administration in time range)    Or  ondansetron (ZOFRAN) injection 4 mg (has no administration in time range)  melatonin tablet 10 mg (has no administration in time range)  HYDROcodone-acetaminophen (NORCO/VICODIN) 5-325 MG per tablet 1 tablet (1 tablet Oral Given 10/23/20 2304)  lactated ringers infusion ( Intravenous New Bag/Given 10/23/20 2343)  meropenem (MERREM) 1 g in sodium chloride 0.9 % 100 mL IVPB (0 g Intravenous Stopped 10/23/20 1752)    ED Course  I have reviewed the triage vital signs and the nursing notes.  Pertinent labs & imaging results that were available during my care of the patient were reviewed by me and considered in my medical decision making (see chart for details).    MDM Rules/Calculators/A&P                         patient here for evaluation of fever, chills, dysuria. She was started on Macrobid yesterday for UTI. She does have cultures from prior UTI in March with multi drug-resistant E. coli. Discussed with pharmacist, will start Meropenem.  Hospitalist consulted for admission.    Final Clinical Impression(s) / ED Diagnoses Final diagnoses:  None    Rx / DC Orders ED Discharge Orders    None       Quintella Reichert, MD 10/24/20 [phone]

## 2020-10-23 NOTE — ED Triage Notes (Signed)
EMS reports from home called out for fever and cough, chest pain, diaphoreitc and generalized weakness x 2-3 days, saw PCP yesterday Dx with UTI and started ABX. Finished treatment for E-Coli last week.  BP 146/84 HR 88 RR 20 Sp02 92 RA Temp 101.1  18ga L forearm 500 ml NS  Tylenol taken prior to arrival

## 2020-10-23 NOTE — H&P (Signed)
History and Physical    [name] LKG:[id number] DOB: 27-Jan-1933 DOA: 10/23/2020  PCP: Orpah Melter, MD  Patient coming from: Home via EMS   Chief Complaint:  Chief Complaint  Patient presents with  . Fever  . UTI  . Weakness  . Cough     HPI:    87 year old female with past medical history of paroxysmal atrial fibrillation, bronchiectasis, gastroesophageal reflux disease, osteoarthritis who presents to [facility] emergency department with complaints of dysuria and weakness.  Patient explains that in late March she was diagnosed with a urinary tract infection.  At that time patient was noted to have multidrug-resistant E. coli but was successfully treated with a course of oral antibiotics, likely with Macrobid.  Patient explains that she has been feeling well until this last Wednesday when she began to experience recurrent dysuria.  This dysuria continue to persist to clinic several days and on Wednesday the patient presented to her primary care provider who obtained a urine sample and placed her on nitrofurantoin.  Over the following 24 hours [name] progressive generalized weakness, episodes of shaking, fevers, nausea and poor appetite.  Patient also experienced an episode of fever of 101.5 F.  This prompted her to contact EMS. The patient was promptly evaluated and brought into [facility] emergency department for evaluation.  Evaluation in the emergency department patient was additionally found to have a mild leukocytosis of 12.8.  Patient was also found to be intermittently tachycardic and tachypneic.  CT imaging of the abdomen pelvis was performed without contrast revealing bladder wall thickening as well as right perinephric edema concerning for cystitis and right-sided pyelonephritis.  Based on previous sensitivities of the drug-resistant E. coli patient was initiated on intravenous meropenem.  The hospitalist group was then called to assess the  patient for admission to the hospital.  Review of Systems:   Review of Systems  Constitutional: Positive for fever.  Genitourinary: Positive for dysuria.  Neurological: Positive for weakness.  All other systems reviewed and are negative.   Past Medical History:  Diagnosis Date  . Arm numbness left   . Back pain   . Bronchitis, chronic ([place])   . Depression   . Dizziness   . Fatigue   . Hypertension    mild  . Palpitations   . Skin cancer   . Stress   . Syncope    History of     Past Surgical History:  Procedure Laterality Date  . APPENDECTOMY    . CESAREAN SECTION  [phone]     reports that she has never smoked. She has never used smokeless tobacco. She reports current alcohol use. She reports that she does not use drugs.  Allergies  Allergen Reactions  . Ciprofloxacin Other (See Comments)    triggered a migraine that was thought to be a TIA  . Penicillins Rash    Has patient had a PCN reaction causing immediate rash, facial/tongue/throat swelling, SOB or lightheadedness with hypotension: Yes Has patient had a PCN reaction causing severe rash involving mucus membranes or skin necrosis: No Has patient had a PCN reaction that required hospitalization: No Has patient had a PCN reaction occurring within the last 10 years: No If all of the above answers are "NO", then may proceed with Cephalosporin use.  . Sulfonamide Derivatives Swelling  . Apixaban     Other reaction(s): weakness  . Avelox [Moxifloxacin] Other (See Comments)    Rash and severe tingling/numbness   . Band-Aid Infection Defense [  Bacitracin-Polymyxin B] Other (See Comments)    unknown  . Mannitol   . Multaq [Dronedarone]     Muscle aches  . Nitrofurantoin Monohyd Macro     Other reaction(s): due to lung disease  . Other Other (See Comments)    Water for injection, sterile - unknown  . Prednisone     Other reaction(s): rash  . Reclast [Zoledronic Acid] Other (See Comments)    hypertension   . Sulfa Antibiotics Other (See Comments)    unknown  . Chlorpheniramine Other (See Comments)    Headache, stomach ache, bladder "not working well"    Family History  Problem Relation Age of Onset  . Atrial fibrillation Mother   . Thyroid disease Mother   . Alcohol abuse Father      Prior to Admission medications   Medication Sig Start Date End Date Taking? Authorizing Provider  acetaminophen (TYLENOL) 500 MG tablet Take 1,000 mg by mouth every 6 (six) hours as needed for fever.   Yes [provider]  Ascorbic Acid (VITAMIN C) 100 MG tablet Take 100 mg by mouth daily.   Yes [provider]  cholecalciferol (VITAMIN D3) 25 MCG (1000 UNIT) tablet Take 1,000 Units by mouth daily.   Yes [provider]  Dextromethorphan-guaiFENesin (MUCINEX DM) 30-600 MG TB12 Take 1 tablet by mouth daily.   Yes [provider]  diltiazem (CARDIZEM) 30 MG tablet Take 1 tablet (30 mg total) by mouth 4 (four) times daily as needed (for elevated heart rates). 07/05/20  Yes Camnitz, Will Hassell Done, MD  ELIQUIS 2.5 MG TABS tablet TAKE 1 TABLET(2.5 MG) BY MOUTH TWICE DAILY Patient taking differently: Take 2.5 mg by mouth 2 (two) times daily. 09/10/20  Yes Sherran Needs, NP  famotidine (PEPCID) 20 MG tablet One after supper Patient taking differently: Take 20 mg by mouth daily. One after supper 08/02/20  Yes Tanda Rockers, MD  folic acid (FOLVITE) 1 MG tablet Take 1 mg by mouth daily.   Yes [provider]  HYDROcodone-acetaminophen (NORCO/VICODIN) 5-325 MG tablet Take 1 tablet by mouth every 4 (four) hours as needed for moderate pain. Patient taking differently: Take 1 tablet by mouth at bedtime. 06/24/18  Yes Tanda Rockers, MD  levofloxacin (LEVAQUIN) 500 MG tablet Take 1 tablet (500 mg total) by mouth daily. Patient taking differently: Take 500 mg by mouth daily as needed (cough). 08/02/20  Yes Tanda Rockers, MD  Multiple Vitamins-Minerals (ONE-A-DAY WOMENS 50 PLUS  PO) Take 1 tablet by mouth daily.   Yes [provider]  nitrofurantoin, macrocrystal-monohydrate, (MACROBID) 100 MG capsule Take 100 mg by mouth 2 (two) times daily. Start date : 10/22/20 10/22/20  Yes [provider]  pantoprazole (PROTONIX) 40 MG tablet Take 1 tablet (40 mg total) by mouth daily. Take 30-60 min before first meal of the day 08/02/20  Yes Tanda Rockers, MD    Physical Exam: Vitals:   10/24/20 0100 10/24/20 0130 10/24/20 0148 10/24/20 0625  BP: (!) 142/71 (!) 151/72 (!) 174/79 (!) 125/93  Pulse: 92 91 (!) 103 84  Resp: 20 20 19    Temp:  98.1 F (36.7 C) 98.5 F (36.9 C) 97.9 F (36.6 C)  TempSrc:  Oral Oral Oral  SpO2: 91% 92% 93% 94%    Constitutional: Awake alert and oriented x3, no associated distress.   Skin: no rashes, no lesions, poor skin turgor noted. Eyes: Pupils are equally reactive to light.  No evidence of scleral icterus or conjunctival  pallor.  ENMT: Dry mucous membranes noted.  Posterior pharynx clear of any exudate or lesions.   Neck: normal, supple, no masses, no thyromegaly.  No evidence of jugular venous distension.   Respiratory: clear to auscultation bilaterally, no wheezing, no crackles. Normal respiratory effort. No accessory muscle use.  Cardiovascular: Regular rate and rhythm, no murmurs / rubs / gallops. No extremity edema. 2+ pedal pulses. No carotid bruits.  Chest:   Nontender without crepitus or deformity.   Back:   Nontender without crepitus or deformity. Abdomen: Notable lower abdominal tenderness as well as left flank tenderness.  Abdomen is soft.  No evidence of intra-abdominal masses.  Positive bowel sounds noted in all quadrants.   Musculoskeletal: No joint deformity upper and lower extremities. Good ROM, no contractures. Normal muscle tone.  Neurologic: CN 2-12 grossly intact. Sensation intact.  Patient moving all 4 extremities spontaneously.  Patient is following all commands.  Patient is responsive to verbal  stimuli.   Psychiatric: Patient exhibits normal mood with appropriate affect.  Patient seems to possess insight as to their current situation.     Labs on Admission: I have personally reviewed following labs and imaging studies -   CBC: Recent Labs  Lab 10/23/20 1715 10/24/20 0530  WBC 12.8* 9.5  NEUTROABS 11.8* 8.2*  HGB 13.4 13.2  HCT 39.8 39.3  MCV 93.4 93.8  PLT 261 [id number]   Basic Metabolic Panel: Recent Labs  Lab 10/23/20 1715 10/24/20 0530  NA 135 135  K 3.5 3.7  CL 106 106  CO2 22 24  GLUCOSE 110* 107*  BUN 14 12  CREATININE 0.70 0.58  CALCIUM 8.2* 8.5*  MG  --  1.9   GFR: Estimated Creatinine Clearance: 42.6 mL/min (by C-G formula based on SCr of 0.58 mg/dL). Liver Function Tests: Recent Labs  Lab 10/23/20 1715 10/24/20 0530  AST 17 15  ALT 14 13  ALKPHOS 44 42  BILITOT 0.5 0.6  PROT 5.9* 6.0*  ALBUMIN 3.0* 2.9*   No results for input(s): LIPASE, AMYLASE in the last 168 hours. No results for input(s): AMMONIA in the last 168 hours. Coagulation Profile: Recent Labs  Lab 10/23/20 1715  INR 1.1   Cardiac Enzymes: No results for input(s): CKTOTAL, CKMB, CKMBINDEX, TROPONINI in the last 168 hours. BNP (last 3 results) No results for input(s): PROBNP in the last 8760 hours. HbA1C: No results for input(s): HGBA1C in the last 72 hours. CBG: No results for input(s): GLUCAP in the last 168 hours. Lipid Profile: No results for input(s): CHOL, HDL, LDLCALC, TRIG, CHOLHDL, LDLDIRECT in the last 72 hours. Thyroid Function Tests: No results for input(s): TSH, T4TOTAL, FREET4, T3FREE, THYROIDAB in the last 72 hours. Anemia Panel: No results for input(s): VITAMINB12, FOLATE, FERRITIN, TIBC, IRON, RETICCTPCT in the last 72 hours. Urine analysis:    Component Value Date/Time   COLORURINE YELLOW 10/23/2020 1702   APPEARANCEUR CLEAR 10/23/2020 1702   LABSPEC 1.006 10/23/2020 1702   PHURINE 7.0 10/23/2020 1702   GLUCOSEU NEGATIVE 10/23/2020 1702   HGBUR  NEGATIVE 10/23/2020 1702   BILIRUBINUR NEGATIVE 10/23/2020 1702   KETONESUR 20 (A) 10/23/2020 1702   PROTEINUR NEGATIVE 10/23/2020 1702   UROBILINOGEN 0.2 01/09/2011 0159   NITRITE NEGATIVE 10/23/2020 1702   LEUKOCYTESUR SMALL (A) 10/23/2020 1702    Radiological Exams on Admission - Personally Reviewed: CT ABDOMEN PELVIS WO CONTRAST  Result Date: 10/24/2020 CLINICAL DATA:  Complicated pyelonephritis. Fever. Weakness. Recent diagnosis of UTI. EXAM: CT ABDOMEN AND PELVIS WITHOUT CONTRAST TECHNIQUE:  Multidetector CT imaging of the abdomen and pelvis was performed following the standard protocol without IV contrast. COMPARISON:  CT 09/09/2018 FINDINGS: Lower chest: Chronic tree in bud opacities in the right lower and middle lobes, but improved from prior exam. Probable lingular bronchiectasis. There is a 10 mm medial right lower lobe pulmonary nodule, series 4, image 5, new from prior. This appears adjacent to bronchus and may represent an area of mucoid plugging. Hepatobiliary: Similar appearance of cyst in the right lobe of the liver from prior exam allowing for lack of IV contrast. No new hepatic abnormality. Question of layering sludge in the gallbladder. No calcified gallstone or pericholecystic inflammation. No biliary dilatation. Pancreas: Mild parenchymal atrophy. No ductal dilatation or inflammation. Spleen: Normal in size without focal abnormality. Adrenals/Urinary Tract: No adrenal nodule. There is right perinephric edema. No hydronephrosis. No evidence of renal fluid collection. Left renal cyst was better appreciated on prior contrast-enhanced exam. Mild medial left renal scarring. No renal calculi. Wall thickening of the anterior bladder dome. Mild adjacent perivesicular fat stranding. Stomach/Bowel: Decompressed stomach. No small bowel obstruction or inflammatory change. Appendectomy. Small to moderate volume of colonic stool. There is colonic redundancy. Scattered colonic diverticula from the  splenic flexure distally, most prominently affecting the sigmoid. Mild sigmoid mural hypertrophy without evidence of acute diverticulitis or colonic inflammation. Vascular/Lymphatic: Aortic and branch atherosclerosis. No aortic aneurysm. No bulky abdominopelvic adenopathy. Reproductive: Uterus and bilateral adnexa are unremarkable. Other: No ascites or free air.  No abdominal wall hernia. Musculoskeletal: Multilevel degenerative change in the spine. Degenerative change of both hips. The bones appear under mineralized. There are no acute or suspicious osseous abnormalities. Right gluteal calcified subcutaneous granuloma, unchanged. IMPRESSION: 1. Bladder wall thickening as well as right perinephric edema, suspicious for cystitis and urinary tract infection. No hydronephrosis. No evidence of renal abscess. 2. Colonic diverticulosis without acute inflammation. 3. Chronic tree in bud opacities in the right lower and middle lobes, improved from prior exam. There is a 10 mm medial right lower lobe pulmonary nodule which appears adjacent to bronchus and may represent an area of mucoid plugging, new from prior. Consider follow-up chest CT in 3 months to assess for stability. Aortic Atherosclerosis (ICD10-I70.0). Electronically Signed   By: Keith Rake M.D.   On: 10/24/2020 00:38   DG Chest 2 View  Result Date: 10/23/2020 CLINICAL DATA:  Fever, cough EXAM: CHEST - 2 VIEW COMPARISON:  Chest radiograph from earlier today. FINDINGS: Loop recorder overlies the left heart. Stable cardiomediastinal silhouette with normal heart size. No pneumothorax. No pleural effusion. Chronic patchy nodular opacities in right mid lung are unchanged. Irregular tram track lucencies in both lungs compatible with known bronchiectasis. No superimposed acute consolidative airspace disease. IMPRESSION: 1. No acute cardiopulmonary disease. 2. Chronic patchy nodular opacities in the right mid lung and known chronic bronchiectasis, unchanged.  Electronically Signed   By: Ilona Sorrel M.D.   On: 10/23/2020 18:33   DG Chest Port 1 View  Result Date: 10/23/2020 CLINICAL DATA:  Cough, fever EXAM: PORTABLE CHEST 1 VIEW COMPARISON:  10/11/2020 FINDINGS: Stable cardiomediastinal contours. Loop recorder device is present. Redemonstrated scattered areas of bronchiectasis and mucous plugging, most pronounced within the right mid lung. Previously seen left mid lung nodule appears less conspicuous compared to the previous study. No new focal airspace consolidation. No pleural effusion. Probable skin fold overlying the peripheral aspect of the right mid lung. Otherwise, no evidence of pneumothorax. IMPRESSION: 1. Probable skin fold overlying the peripheral aspect of the right  mid lung. Consider repeat study after patient repositioning to exclude the possibility of a small pneumothorax. 2. Chronic scattered areas of bronchiectasis and mucous plugging. No new focal airspace consolidation. 3. Previously seen left mid lung nodule appears less conspicuous compared to the previous study. Electronically Signed   By: Davina Poke D.O.   On: 10/23/2020 17:59    EKG: Personally reviewed.  Rhythm is sinus rhythm with heart rate of 83 bpm.  No dynamic ST segment changes appreciated.  Assessment/Plan Principal Problem:   Acute pyelonephritis   Patient presenting with several days of dysuria abdominal pain and flank pain  Urinalysis here is moderately compelling for urinary tract infection  CT imaging reveals evidence of cystitis with right perinephric edema concerning for pyelonephritis  I have reviewed the sensitivities of the E. coli UTI in late March which is multidrug-resistant.  We will place patient on intravenous meropenem for now until final cultures are back  Blood cultures obtained  Gentle intravenous hydration  Active Problems:   Obstructive bronchiectasis (HCC)   Longstanding history of bronchiectasis  Continue home regimen of as  needed antitussives and as needed bronchodilator therapy    AF (paroxysmal atrial fibrillation) (HCC)   Continue home regimen of diltiazem and Eliquis    GERD without esophagitis   Continue home regimen of proton pump inhibitor  Code Status:  Full code Family Communication: deferred   Status is: Observation  The patient remains OBS appropriate and will d/c before 2 midnights.  Dispo: The patient is from: Home              Anticipated d/c is to: Home              Patient currently is not medically stable to d/c.   Difficult to place patient No        Vernelle Emerald MD [facility] Pager [phone]  If 7PM-7AM, please contact night-coverage [email or web address] Use universal [place] password for that web site. If you do not have the password, please call the hospital operator.  10/24/2020, 9:24 AM

## 2020-10-24 ENCOUNTER — Other Ambulatory Visit (HOSPITAL_COMMUNITY): Payer: Medicare Other

## 2020-10-24 ENCOUNTER — Encounter (HOSPITAL_COMMUNITY): Payer: Self-pay | Admitting: Internal Medicine

## 2020-10-24 ENCOUNTER — Observation Stay (HOSPITAL_COMMUNITY): Payer: Medicare Other

## 2020-10-24 DIAGNOSIS — Z1624 Resistance to multiple antibiotics: Secondary | ICD-10-CM | POA: Diagnosis present

## 2020-10-24 DIAGNOSIS — I480 Paroxysmal atrial fibrillation: Secondary | ICD-10-CM | POA: Diagnosis present

## 2020-10-24 DIAGNOSIS — Z888 Allergy status to other drugs, medicaments and biological substances status: Secondary | ICD-10-CM | POA: Diagnosis not present

## 2020-10-24 DIAGNOSIS — Z79899 Other long term (current) drug therapy: Secondary | ICD-10-CM | POA: Diagnosis not present

## 2020-10-24 DIAGNOSIS — Z880 Allergy status to penicillin: Secondary | ICD-10-CM | POA: Diagnosis not present

## 2020-10-24 DIAGNOSIS — K219 Gastro-esophageal reflux disease without esophagitis: Secondary | ICD-10-CM | POA: Diagnosis present

## 2020-10-24 DIAGNOSIS — Z20822 Contact with and (suspected) exposure to covid-19: Secondary | ICD-10-CM | POA: Diagnosis present

## 2020-10-24 DIAGNOSIS — Z7901 Long term (current) use of anticoagulants: Secondary | ICD-10-CM | POA: Diagnosis not present

## 2020-10-24 DIAGNOSIS — N3289 Other specified disorders of bladder: Secondary | ICD-10-CM | POA: Diagnosis not present

## 2020-10-24 DIAGNOSIS — N281 Cyst of kidney, acquired: Secondary | ICD-10-CM | POA: Diagnosis not present

## 2020-10-24 DIAGNOSIS — R509 Fever, unspecified: Secondary | ICD-10-CM | POA: Diagnosis present

## 2020-10-24 DIAGNOSIS — Z881 Allergy status to other antibiotic agents status: Secondary | ICD-10-CM | POA: Diagnosis not present

## 2020-10-24 DIAGNOSIS — I10 Essential (primary) hypertension: Secondary | ICD-10-CM | POA: Diagnosis present

## 2020-10-24 DIAGNOSIS — K7689 Other specified diseases of liver: Secondary | ICD-10-CM | POA: Diagnosis not present

## 2020-10-24 DIAGNOSIS — N12 Tubulo-interstitial nephritis, not specified as acute or chronic: Secondary | ICD-10-CM | POA: Diagnosis present

## 2020-10-24 DIAGNOSIS — J42 Unspecified chronic bronchitis: Secondary | ICD-10-CM | POA: Diagnosis present

## 2020-10-24 DIAGNOSIS — Z811 Family history of alcohol abuse and dependence: Secondary | ICD-10-CM | POA: Diagnosis not present

## 2020-10-24 DIAGNOSIS — M1990 Unspecified osteoarthritis, unspecified site: Secondary | ICD-10-CM | POA: Diagnosis present

## 2020-10-24 DIAGNOSIS — Z85828 Personal history of other malignant neoplasm of skin: Secondary | ICD-10-CM | POA: Diagnosis not present

## 2020-10-24 DIAGNOSIS — R630 Anorexia: Secondary | ICD-10-CM | POA: Diagnosis present

## 2020-10-24 DIAGNOSIS — Z882 Allergy status to sulfonamides status: Secondary | ICD-10-CM | POA: Diagnosis not present

## 2020-10-24 DIAGNOSIS — N10 Acute tubulo-interstitial nephritis: Secondary | ICD-10-CM | POA: Diagnosis present

## 2020-10-24 DIAGNOSIS — F32A Depression, unspecified: Secondary | ICD-10-CM | POA: Diagnosis present

## 2020-10-24 DIAGNOSIS — Z8349 Family history of other endocrine, nutritional and metabolic diseases: Secondary | ICD-10-CM | POA: Diagnosis not present

## 2020-10-24 LAB — CBC WITH DIFFERENTIAL/PLATELET
Abs Immature Granulocytes: 0.16 10*3/uL — ABNORMAL HIGH (ref 0.00–0.07)
Basophils Absolute: 0.1 10*3/uL (ref 0.0–0.1)
Basophils Relative: 1 %
Eosinophils Absolute: 0.2 10*3/uL (ref 0.0–0.5)
Eosinophils Relative: 2 %
HCT: 39.3 % (ref 36.0–46.0)
Hemoglobin: 13.2 g/dL (ref 12.0–15.0)
Immature Granulocytes: 2 %
Lymphocytes Relative: 5 %
Lymphs Abs: 0.4 10*3/uL — ABNORMAL LOW (ref 0.7–4.0)
MCH: 31.5 pg (ref 26.0–34.0)
MCHC: 33.6 g/dL (ref 30.0–36.0)
MCV: 93.8 fL (ref 80.0–100.0)
Monocytes Absolute: 0.4 10*3/uL (ref 0.1–1.0)
Monocytes Relative: 5 %
Neutro Abs: 8.2 10*3/uL — ABNORMAL HIGH (ref 1.7–7.7)
Neutrophils Relative %: 85 %
Platelets: 265 10*3/uL (ref 150–400)
RBC: 4.19 MIL/uL (ref 3.87–5.11)
RDW: 14 % (ref 11.5–15.5)
WBC: 9.5 10*3/uL (ref 4.0–10.5)
nRBC: 0 % (ref 0.0–0.2)

## 2020-10-24 LAB — COMPREHENSIVE METABOLIC PANEL
ALT: 13 U/L (ref 0–44)
AST: 15 U/L (ref 15–41)
Albumin: 2.9 g/dL — ABNORMAL LOW (ref 3.5–5.0)
Alkaline Phosphatase: 42 U/L (ref 38–126)
Anion gap: 5 (ref 5–15)
BUN: 12 mg/dL (ref 8–23)
CO2: 24 mmol/L (ref 22–32)
Calcium: 8.5 mg/dL — ABNORMAL LOW (ref 8.9–10.3)
Chloride: 106 mmol/L (ref 98–111)
Creatinine, Ser: 0.58 mg/dL (ref 0.44–1.00)
GFR, Estimated: >60 mL/min (ref >60)
Glucose, Bld: 107 mg/dL — ABNORMAL HIGH (ref 70–99)
Potassium: 3.7 mmol/L (ref 3.5–5.1)
Sodium: 135 mmol/L (ref 135–145)
Total Bilirubin: 0.6 mg/dL (ref 0.3–1.2)
Total Protein: 6 g/dL — ABNORMAL LOW (ref 6.5–8.1)

## 2020-10-24 LAB — MAGNESIUM: Magnesium: 1.9 mg/dL (ref 1.7–2.4)

## 2020-10-24 MED ORDER — ENSURE ENLIVE PO LIQD: 237.0000 mL | Freq: Two times a day (BID) | ORAL | Status: DC

## 2020-10-24 MED ORDER — BENZONATATE 100 MG PO CAPS: 200.0000 mg | ORAL_CAPSULE | Freq: Three times a day (TID) | ORAL | Status: DC | PRN

## 2020-10-24 MED ORDER — MENTHOL 3 MG MT LOZG
1.0000 | LOZENGE | OROMUCOSAL | Status: DC | PRN
  Administered 2020-10-24: 3 mg via ORAL
  Filled 2020-10-24: qty 9

## 2020-10-24 MED ORDER — SODIUM CHLORIDE 0.9 % IV SOLN
1.0000 g | Freq: Two times a day (BID) | INTRAVENOUS | Status: DC
  Administered 2020-10-24 – 2020-10-26 (×5): 1 g via INTRAVENOUS
  Filled 2020-10-24 (×5): qty 1

## 2020-10-24 MED ORDER — IPRATROPIUM-ALBUTEROL 0.5-2.5 (3) MG/3ML IN SOLN: 3.0000 mL | RESPIRATORY_TRACT | Status: DC | PRN

## 2020-10-24 MED ORDER — HYDRALAZINE HCL 20 MG/ML IJ SOLN
10.0000 mg | Freq: Four times a day (QID) | INTRAMUSCULAR | Status: DC | PRN
  Administered 2020-10-25: 10 mg via INTRAVENOUS
  Filled 2020-10-24: qty 1

## 2020-10-24 MED ORDER — DM-GUAIFENESIN ER 30-600 MG PO TB12
1.0000 | ORAL_TABLET | Freq: Two times a day (BID) | ORAL | Status: DC
  Administered 2020-10-24: 1 via ORAL
  Filled 2020-10-24 (×3): qty 1

## 2020-10-24 NOTE — Hospital Course (Addendum)
87 yo female with past history of resistant E. Coli UTI's, paroxysmal A-fib, bronchiectasis, GERD, osteoarthritis  who presented to ED via EMS for evaluation of ongoing dysuria despite outpatient Macrobid started the day before, fevers, progressive generalized weakness.  Also reported worsening of her chronic cough over past day.  Evaluation in the ED, afebrile, stable vitals but BP elevated at 167/69.  Labs with leukocytosis 12.8k but otherwise unremarkable and lactate normal.  UA possibly infected, reflexed to culture.  CT abd/pelvis showed bladder wall thickening and Right perinephric edema consistent with cystitis and pyelonephritis.  Admitted to the hospital and started on empiric IV Merrem, based on recent culture data while culture here is pending.

## 2020-10-24 NOTE — Progress Notes (Signed)
Pharmacy Antibiotic Note  [name] is a 87 y.o. female admitted on 10/23/2020 with fever, cough and weakness.  PT has a history of resistant E. [place] has been consulted to dose merrem for UTI  Plan: merrem 1gm IV q12h Follow renal function, cultures and clinical course     Temp (24hrs), Avg:98.3 F (36.8 C), Min:98.1 F (36.7 C), Max:98.5 F (36.9 C)  Recent Labs  Lab 10/23/20 1715  WBC 12.8*  CREATININE 0.70  LATICACIDVEN 1.1    Estimated Creatinine Clearance: 42.6 mL/min (by C-G formula based on SCr of 0.7 mg/dL).    Allergies  Allergen Reactions  . Ciprofloxacin Other (See Comments)    triggered a migraine that was thought to be a TIA  . Penicillins Rash    Has patient had a PCN reaction causing immediate rash, facial/tongue/throat swelling, SOB or lightheadedness with hypotension: Yes Has patient had a PCN reaction causing severe rash involving mucus membranes or skin necrosis: No Has patient had a PCN reaction that required hospitalization: No Has patient had a PCN reaction occurring within the last 10 years: No If all of the above answers are "NO", then may proceed with Cephalosporin use.  . Sulfonamide Derivatives Swelling  . Apixaban     Other reaction(s): weakness  . Avelox [Moxifloxacin] Other (See Comments)    Rash and severe tingling/numbness   . Band-Aid Infection Defense [Bacitracin-Polymyxin B] Other (See Comments)    unknown  . Mannitol   . Multaq [Dronedarone]     Muscle aches  . Nitrofurantoin Monohyd Macro     Other reaction(s): due to lung disease  . Other Other (See Comments)    Water for injection, sterile - unknown  . Prednisone     Other reaction(s): rash  . Reclast [Zoledronic Acid] Other (See Comments)    hypertension  . Sulfa Antibiotics Other (See Comments)    unknown  . Chlorpheniramine Other (See Comments)    Headache, stomach ache, bladder "not working well"     Thank you for allowing pharmacy to be a part  of this patient's care.  Dolly Rias RPh 10/24/2020, 1:58 AM

## 2020-10-24 NOTE — Plan of Care (Signed)

## 2020-10-24 NOTE — ED Notes (Signed)
ED TO INPATIENT HANDOFF REPORT  ED Nurse Name and Phone #: [phone], Erick Colace, RN   S Name/Age/Gender [name] 87 y.o. female Room/Bed: WA17/WA17  Code Status   Code Status: Full Code  Home/SNF/Other Home Patient oriented to: self, place, time and situation Is this baseline? Yes   Triage Complete: Triage complete  Chief Complaint Acute UTI [N39.0]  Triage Note EMS reports from home called out for fever and cough, chest pain, diaphoreitc and generalized weakness x 2-3 days, saw PCP yesterday Dx with UTI and started ABX. Finished treatment for E-Coli last week.  BP 146/84 HR 88 RR 20 Sp02 92 RA Temp 101.1  18ga L forearm 500 ml NS  Tylenol taken prior to arrival    Allergies Allergies  Allergen Reactions  . Ciprofloxacin Other (See Comments)    triggered a migraine that was thought to be a TIA  . Penicillins Rash    Has patient had a PCN reaction causing immediate rash, facial/tongue/throat swelling, SOB or lightheadedness with hypotension: Yes Has patient had a PCN reaction causing severe rash involving mucus membranes or skin necrosis: No Has patient had a PCN reaction that required hospitalization: No Has patient had a PCN reaction occurring within the last 10 years: No If all of the above answers are "NO", then may proceed with Cephalosporin use.  . Sulfonamide Derivatives Swelling  . Apixaban     Other reaction(s): weakness  . Avelox [Moxifloxacin] Other (See Comments)    Rash and severe tingling/numbness   . Band-Aid Infection Defense [Bacitracin-Polymyxin B] Other (See Comments)    unknown  . Mannitol   . Multaq [Dronedarone]     Muscle aches  . Nitrofurantoin Monohyd Macro     Other reaction(s): due to lung disease  . Other Other (See Comments)    Water for injection, sterile - unknown  . Prednisone     Other reaction(s): rash  . Reclast [Zoledronic Acid] Other (See Comments)    hypertension  . Sulfa Antibiotics Other (See Comments)     unknown  . Chlorpheniramine Other (See Comments)    Headache, stomach ache, bladder "not working well"    Level of Care/Admitting Diagnosis ED Disposition    ED Disposition Condition [place]: [place] [[id number]]  Level of Care: Telemetry [5]  Admit to tele based on following criteria: Monitor for Ischemic changes  Covid Evaluation: Asymptomatic Screening Protocol (No Symptoms)  Diagnosis: Acute UTI [098119]  Admitting Physician: Vernelle Emerald [[id number]]  Attending Physician: Vernelle Emerald [[id number]]       B Medical/Surgery History Past Medical History:  Diagnosis Date  . Arm numbness left   . Back pain   . Bronchitis, chronic ([place])   . Depression   . Dizziness   . Fatigue   . Hypertension    mild  . Palpitations   . Skin cancer   . Stress   . Syncope    History of    Past Surgical History:  Procedure Laterality Date  . APPENDECTOMY    . CESAREAN SECTION  [phone]     A IV Location/Drains/Wounds Patient Lines/Drains/Airways Status    Active Line/Drains/Airways    Name Placement date Placement time Site Days   Peripheral IV 10/23/20 Anterior;Left Forearm 10/23/20  1657  Forearm  1          Intake/Output Last 24 hours  Intake/Output Summary (Last 24 hours) at 10/24/2020 0009 Last data filed at 10/23/2020 1752 Gross  per 24 hour  Intake 100 ml  Output --  Net 100 ml    Labs/Imaging Results for orders placed or performed during the hospital encounter of 10/23/20 (from the past 48 hour(s))  Urinalysis, Routine w reflex microscopic Urine, Clean Catch     Status: Abnormal   Collection Time: 10/23/20  5:02 PM  Result Value Ref Range   Color, Urine YELLOW YELLOW   APPearance CLEAR CLEAR   Specific Gravity, Urine 1.006 1.005 - 1.030   pH 7.0 5.0 - 8.0   Glucose, UA NEGATIVE NEGATIVE mg/dL   Hgb urine dipstick NEGATIVE NEGATIVE   Bilirubin Urine NEGATIVE NEGATIVE   Ketones, ur 20 (A) NEGATIVE  mg/dL   Protein, ur NEGATIVE NEGATIVE mg/dL   Nitrite NEGATIVE NEGATIVE   Leukocytes,Ua SMALL (A) NEGATIVE   RBC / HPF 0-5 0 - 5 RBC/hpf   WBC, UA 11-20 0 - 5 WBC/hpf   Bacteria, UA RARE (A) NONE SEEN   Squamous Epithelial / LPF 0-5 0 - 5    Comment: Performed at [facility], [place] [address] [place], [place] [address]  Lactic acid, plasma     Status: None   Collection Time: 10/23/20  5:15 PM  Result Value Ref Range   Lactic Acid, Venous 1.1 0.5 - 1.9 mmol/L    Comment: Performed at [facility], [place] [address], [place], [place] [address]  Comprehensive metabolic panel     Status: Abnormal   Collection Time: 10/23/20  5:15 PM  Result Value Ref Range   Sodium 135 135 - 145 mmol/L   Potassium 3.5 3.5 - 5.1 mmol/L   Chloride 106 98 - 111 mmol/L   CO2 22 22 - 32 mmol/L   Glucose, Bld 110 (H) 70 - 99 mg/dL    Comment: Glucose reference range applies only to samples taken after fasting for at least 8 hours.   BUN 14 8 - 23 mg/dL   Creatinine, Ser 0.70 0.44 - 1.00 mg/dL   Calcium 8.2 (L) 8.9 - 10.3 mg/dL   Total Protein 5.9 (L) 6.5 - 8.1 g/dL   Albumin 3.0 (L) 3.5 - 5.0 g/dL   AST 17 15 - 41 U/L   ALT 14 0 - 44 U/L   Alkaline Phosphatase 44 38 - 126 U/L   Total Bilirubin 0.5 0.3 - 1.2 mg/dL   GFR, Estimated >60 >60 mL/min    Comment: (NOTE) Calculated using the CKD-EPI Creatinine Equation (2021)    Anion gap 7 5 - 15    Comment: Performed at [facility], [place] [address] [place], [place] [address]  CBC WITH DIFFERENTIAL     Status: Abnormal   Collection Time: 10/23/20  5:15 PM  Result Value Ref Range   WBC 12.8 (H) 4.0 - 10.5 K/uL   RBC 4.26 3.87 - 5.11 MIL/uL   Hemoglobin 13.4 12.0 - 15.0 g/dL   HCT 39.8 36.0 - 46.0 %   MCV 93.4 80.0 - 100.0 fL   MCH 31.5 26.0 - 34.0 pg   MCHC 33.7 30.0 - 36.0 g/dL   RDW 13.9 11.5 - 15.5 %   Platelets 261 150 - 400 K/uL   nRBC 0.0 0.0 - 0.2 %   Neutrophils Relative % 92 %   Neutro  Abs 11.8 (H) 1.7 - 7.7 K/uL   Lymphocytes Relative 3 %   Lymphs Abs 0.4 (L) 0.7 - 4.0 K/uL   Monocytes Relative 4 %   Monocytes Absolute 0.5 0.1 -  1.0 K/uL   Eosinophils Relative 0 %   Eosinophils Absolute 0.0 0.0 - 0.5 K/uL   Basophils Relative 0 %   Basophils Absolute 0.1 0.0 - 0.1 K/uL   Immature Granulocytes 1 %   Abs Immature Granulocytes 0.11 (H) 0.00 - 0.07 K/uL    Comment: Performed at [facility], [place] [address] [place], [place] [address]  Protime-INR     Status: None   Collection Time: 10/23/20  5:15 PM  Result Value Ref Range   Prothrombin Time 14.6 11.4 - 15.2 seconds   INR 1.1 0.8 - 1.2    Comment: (NOTE) INR goal varies based on device and disease states. Performed at [facility], [place] [address] [place], [place] [address]   APTT     Status: None   Collection Time: 10/23/20  5:15 PM  Result Value Ref Range   aPTT 32 24 - 36 seconds    Comment: Performed at [facility], [place] [address] [place], [place] [address]  Resp Panel by RT-PCR (Flu A&B, Covid) Nasopharyngeal Swab     Status: None   Collection Time: 10/23/20  5:15 PM   Specimen: Nasopharyngeal Swab; Nasopharyngeal(NP) swabs in vial transport medium  Result Value Ref Range   SARS Coronavirus 2 by RT PCR NEGATIVE NEGATIVE    Comment: (NOTE) SARS-CoV-2 target nucleic acids are NOT DETECTED.  The SARS-CoV-2 RNA is generally detectable in upper respiratory specimens during the acute phase of infection. The lowest concentration of SARS-CoV-2 viral copies this assay can detect is 138 copies/mL. A negative result does not preclude SARS-Cov-2 infection and should not be used as the sole basis for treatment or other patient management decisions. A negative result may occur with  improper specimen collection/handling, submission of specimen other than nasopharyngeal swab, presence of viral mutation(s) within the areas targeted by this assay, and inadequate  number of viral copies(<138 copies/mL). A negative result must be combined with clinical observations, patient history, and epidemiological information. The expected result is Negative.  Fact Sheet for Patients:  EntrepreneurPulse.com.au  Fact Sheet for Healthcare Providers:  IncredibleEmployment.be  This test is no t yet approved or cleared by the [place] FDA and  has been authorized for detection and/or diagnosis of SARS-CoV-2 by FDA under an Emergency Use Authorization (EUA). This EUA will remain  in effect (meaning this test can be used) for the duration of the COVID-19 declaration under Section 564(b)(1) of the Act, 21 U.S.C.section 360bbb-3(b)(1), unless the authorization is terminated  or revoked sooner.       Influenza A by PCR NEGATIVE NEGATIVE   Influenza B by PCR NEGATIVE NEGATIVE    Comment: (NOTE) The Xpert Xpress SARS-CoV-2/FLU/RSV plus assay is intended as an aid in the diagnosis of influenza from Nasopharyngeal swab specimens and should not be used as a sole basis for treatment. Nasal washings and aspirates are unacceptable for Xpert Xpress SARS-CoV-2/FLU/RSV testing.  Fact Sheet for Patients: EntrepreneurPulse.com.au  Fact Sheet for Healthcare Providers: IncredibleEmployment.be  This test is not yet approved or cleared by the [place] FDA and has been authorized for detection and/or diagnosis of SARS-CoV-2 by FDA under an Emergency Use Authorization (EUA). This EUA will remain in effect (meaning this test can be used) for the duration of the COVID-19 declaration under Section 564(b)(1) of the Act, 21 U.S.C. section 360bbb-3(b)(1), unless the authorization is terminated or revoked.  Performed at [facility], [place] [address], [place],  [address]  DG Chest 2 View  Result Date: 10/23/2020 CLINICAL DATA:  Fever, cough EXAM: CHEST - 2 VIEW  COMPARISON:  Chest radiograph from earlier today. FINDINGS: Loop recorder overlies the left heart. Stable cardiomediastinal silhouette with normal heart size. No pneumothorax. No pleural effusion. Chronic patchy nodular opacities in right mid lung are unchanged. Irregular tram track lucencies in both lungs compatible with known bronchiectasis. No superimposed acute consolidative airspace disease. IMPRESSION: 1. No acute cardiopulmonary disease. 2. Chronic patchy nodular opacities in the right mid lung and known chronic bronchiectasis, unchanged. Electronically Signed   By: Ilona Sorrel M.D.   On: 10/23/2020 18:33   DG Chest Port 1 View  Result Date: 10/23/2020 CLINICAL DATA:  Cough, fever EXAM: PORTABLE CHEST 1 VIEW COMPARISON:  10/11/2020 FINDINGS: Stable cardiomediastinal contours. Loop recorder device is present. Redemonstrated scattered areas of bronchiectasis and mucous plugging, most pronounced within the right mid lung. Previously seen left mid lung nodule appears less conspicuous compared to the previous study. No new focal airspace consolidation. No pleural effusion. Probable skin fold overlying the peripheral aspect of the right mid lung. Otherwise, no evidence of pneumothorax. IMPRESSION: 1. Probable skin fold overlying the peripheral aspect of the right mid lung. Consider repeat study after patient repositioning to exclude the possibility of a small pneumothorax. 2. Chronic scattered areas of bronchiectasis and mucous plugging. No new focal airspace consolidation. 3. Previously seen left mid lung nodule appears less conspicuous compared to the previous study. Electronically Signed   By: Davina Poke D.O.   On: 10/23/2020 17:59    Pending Labs Unresulted Labs (From admission, onward)          Start     Ordered   10/24/20 0500  Comprehensive metabolic panel  Tomorrow morning,   R        10/23/20 2107   10/24/20 0500  CBC WITH DIFFERENTIAL  Tomorrow morning,   R        10/23/20 2107    10/24/20 0500  Magnesium  Tomorrow morning,   R        10/23/20 2107   10/23/20 1703  Blood Culture (routine x 2)  (Undifferentiated presentation (screening labs and basic nursing orders))  BLOOD CULTURE X 2,   STAT      10/23/20 1702   10/23/20 1702  Lactic acid, plasma  (Undifferentiated presentation (screening labs and basic nursing orders))  Now then every 2 hours,   STAT      10/23/20 1702   10/23/20 1702  Urine culture  (Undifferentiated presentation (screening labs and basic nursing orders))  ONCE - STAT,   STAT        10/23/20 1702          Vitals/Pain Today's Vitals   10/23/20 2215 10/23/20 2230 10/23/20 2344 10/23/20 2344  BP: (!) 178/74 (!) 172/74 (!) 170/81   Pulse: 99 96 (!) 108   Resp: (!) 26 (!) 23 (!) 22   Temp:   98.2 F (36.8 C)   TempSrc:   Oral   SpO2: 93% 94% 91%   PainSc:    3     Isolation Precautions Airborne and Contact precautions  Medications Medications  diltiazem (CARDIZEM) tablet 30 mg (has no administration in time range)  famotidine (PEPCID) tablet 20 mg (has no administration in time range)  pantoprazole (PROTONIX) EC tablet 40 mg (has no administration in time range)  apixaban (ELIQUIS) tablet 2.5 mg (2.5 mg Oral Given [id number])  folic acid (FOLVITE) tablet 1 mg (  has no administration in time range)  acetaminophen (TYLENOL) tablet 650 mg (has no administration in time range)    Or  acetaminophen (TYLENOL) suppository 650 mg (has no administration in time range)  polyethylene glycol (MIRALAX / GLYCOLAX) packet 17 g (has no administration in time range)  ondansetron (ZOFRAN) tablet 4 mg (has no administration in time range)    Or  ondansetron (ZOFRAN) injection 4 mg (has no administration in time range)  melatonin tablet 10 mg (has no administration in time range)  HYDROcodone-acetaminophen (NORCO/VICODIN) 5-325 MG per tablet 1 tablet (1 tablet Oral Given 10/23/20 2304)  lactated ringers infusion ( Intravenous New Bag/Given 10/23/20 2343)   meropenem (MERREM) 1 g in sodium chloride 0.9 % 100 mL IVPB (0 g Intravenous Stopped 10/23/20 1752)    Mobility walks Low fall risk   Focused Assessments    R Recommendations: See Admitting Provider Note  Report given to:   Additional Notes:

## 2020-10-24 NOTE — Progress Notes (Signed)
PROGRESS NOTE    [name]   OBS:[id number]  DOB: [birth year]  PCP: Orpah Melter, MD    DOA: 10/23/2020 LOS: 0   Brief Narrative   87 yo female with past history of resistant E. Coli UTI's, paroxysmal A-fib, bronchiectasis, GERD, osteoarthritis  who presented to ED via EMS for evaluation of ongoing dysuria despite outpatient Macrobid started the day before, fevers, progressive generalized weakness.  Also reported worsening of her chronic cough over past day.  Evaluation in the ED, afebrile, stable vitals but BP elevated at 167/69.  Labs with leukocytosis 12.8k but otherwise unremarkable and lactate normal.  UA possibly infected, reflexed to culture.  CT abd/pelvis showed bladder wall thickening and Right perinephric edema consistent with cystitis and pyelonephritis.  Admitted to the hospital and started on empiric IV Merrem, based on recent culture data while culture here is pending.    Assessment & Plan   Principal Problem:   Acute pyelonephritis Active Problems:   Obstructive bronchiectasis (HCC)   AF (paroxysmal atrial fibrillation) (HCC)   GERD without esophagitis   Pyelonephritis   Acute pyelonephritis, right-sided -presented as outlined above.  Did not meet sepsis criteria on admission.  History of resistant E. coli UTIs.  Failed to improve with outpatient Macrobid. -- Continue empiric meropenem -- Follow-up urine culture, blood culture -- Continue gentle IV fluids  Chronic bronchiectasis -with secondary chronic cough which appears somewhat flared per patient. -- Scheduled Mucinex -- Flutter valve  Paroxysmal A. Fib -currently rate controlled.  Has implantable loop recorder.  Follows with cardiologist Dr. Acie Fredrickson. -- Continue Eliquis, diltiazem  GERD without esophagitis -continue PPI  Patient BMI: There is no height or weight on file to calculate BMI.   DVT prophylaxis: apixaban (ELIQUIS) tablet 2.5 mg Start: 10/23/20 2230 apixaban (ELIQUIS) tablet 2.5 mg    Diet:  Diet Orders (From admission, onward)    Start     Ordered   10/23/20 2107  Diet Heart Room service appropriate? Yes; Fluid consistency: Thin  Diet effective now       Question Answer Comment  Room service appropriate? Yes   Fluid consistency: Thin      10/23/20 2107            Code Status: Full Code    Subjective 10/24/20    Patient sitting up edge of bed shortly after lunch when seen today.  Reports feeling somewhat better today.  Denies fevers or chills overnight or this morning.  Has some discomfort with voiding but no frank dysuria.  Does still have some right-sided flank pain however.  Ongoing dry cough is more frequent than her baseline but she denies thicker darkening colored phlegm.   Disposition Plan & Communication   Status is: Inpatient  Remains inpatient appropriate because:IV treatments appropriate due to intensity of illness or inability to take PO.  Urine culture pending, history of resistant organisms.   Dispo: The patient is from: Home              Anticipated d/c is to: Home              Patient currently is not medically stable to d/c.   Difficult to place patient No   Consults, Procedures, Significant Events   Consultants:   None  Procedures:   None  Antimicrobials:  Anti-infectives (From admission, onward)   Start     Dose/Rate Route Frequency Ordered Stop   10/24/20 0300  meropenem (MERREM) 1 g in sodium chloride 0.9 % 100 mL  IVPB        1 g 200 mL/hr over 30 Minutes Intravenous Every 12 hours 10/24/20 0155     10/23/20 1730  meropenem (MERREM) 1 g in sodium chloride 0.9 % 100 mL IVPB        1 g 200 mL/hr over 30 Minutes Intravenous  Once 10/23/20 1710 10/23/20 1752   10/23/20 1715  meropenem (MERREM) 1 g in sodium chloride 0.9 % 100 mL IVPB  Status:  Discontinued        1 g 200 mL/hr over 30 Minutes Intravenous Every 8 hours 10/23/20 1708 10/23/20 1710        Micro    Objective   Vitals:   10/24/20 0623 10/24/20 0625  10/24/20 1009 10/24/20 1407  BP: (!) 125/93 (!) 125/93 (!) 150/64 (!) 176/71  Pulse: 84 84 78 84  Resp: 18  18 16   Temp: 97.9 F (36.6 C) 97.9 F (36.6 C) 98.1 F (36.7 C) 98.9 F (37.2 C)  TempSrc: Oral Oral Oral Oral  SpO2: 94% 94% 97% 96%    Intake/Output Summary (Last 24 hours) at 10/24/2020 1519 Last data filed at 10/24/2020 1450 Gross per 24 hour  Intake 1054.82 ml  Output --  Net 1054.82 ml   There were no vitals filed for this visit.  Physical Exam:  General exam: awake, alert, no acute distress HEENT: atraumatic, clear conjunctiva, anicteric sclera, moist mucus membranes, hearing grossly normal  Respiratory system: Coarse breath sounds, no wheezes, normal respiratory effort, exam limited by deep breaths trigger coughing. Cardiovascular system: normal S1/S2, RRR, no pedal edema.   Gastrointestinal system: soft, mild suprapubic tenderness on palpation otherwise nontender Central nervous system: A&O x 4. no gross focal neurologic deficits, normal speech Extremities: moves all, no edema, normal tone Skin: dry, intact, normal temperature, normal color, No rashes, lesions or ulcers seen on visualized skin Psychiatry: normal mood, congruent affect, judgement and insight appear normal  Labs   Data Reviewed: I have personally reviewed following labs and imaging studies  CBC: Recent Labs  Lab 10/23/20 1715 10/24/20 0530  WBC 12.8* 9.5  NEUTROABS 11.8* 8.2*  HGB 13.4 13.2  HCT 39.8 39.3  MCV 93.4 93.8  PLT 261 998   Basic Metabolic Panel: Recent Labs  Lab 10/23/20 1715 10/24/20 0530  NA 135 135  K 3.5 3.7  CL 106 106  CO2 22 24  GLUCOSE 110* 107*  BUN 14 12  CREATININE 0.70 0.58  CALCIUM 8.2* 8.5*  MG  --  1.9   GFR: Estimated Creatinine Clearance: 42.6 mL/min (by C-G formula based on SCr of 0.58 mg/dL). Liver Function Tests: Recent Labs  Lab 10/23/20 1715 10/24/20 0530  AST 17 15  ALT 14 13  ALKPHOS 44 42  BILITOT 0.5 0.6  PROT 5.9* 6.0*   ALBUMIN 3.0* 2.9*   No results for input(s): LIPASE, AMYLASE in the last 168 hours. No results for input(s): AMMONIA in the last 168 hours. Coagulation Profile: Recent Labs  Lab 10/23/20 1715  INR 1.1   Cardiac Enzymes: No results for input(s): CKTOTAL, CKMB, CKMBINDEX, TROPONINI in the last 168 hours. BNP (last 3 results) No results for input(s): PROBNP in the last 8760 hours. HbA1C: No results for input(s): HGBA1C in the last 72 hours. CBG: No results for input(s): GLUCAP in the last 168 hours. Lipid Profile: No results for input(s): CHOL, HDL, LDLCALC, TRIG, CHOLHDL, LDLDIRECT in the last 72 hours. Thyroid Function Tests: No results for input(s): TSH, T4TOTAL, FREET4, T3FREE, THYROIDAB  in the last 72 hours. Anemia Panel: No results for input(s): VITAMINB12, FOLATE, FERRITIN, TIBC, IRON, RETICCTPCT in the last 72 hours. Sepsis Labs: Recent Labs  Lab 10/23/20 1715  LATICACIDVEN 1.1    Recent Results (from the past 240 hour(s))  Blood Culture (routine x 2)     Status: None (Preliminary result)   Collection Time: 10/23/20  5:15 PM   Specimen: BLOOD  Result Value Ref Range Status   Specimen Description   Final    BLOOD LEFT FOREARM Performed at [facility] [address], [place], [place] [address]    Special Requests   Final    BOTTLES DRAWN AEROBIC AND ANAEROBIC Blood Culture adequate volume Performed at [facility] [address], [place], [place] [address]    Culture   Final    NO GROWTH < 12 HOURS Performed at [facility] [address] [place], [place] [address]    Report Status PENDING  Incomplete  Resp Panel by RT-PCR (Flu A&B, Covid) Nasopharyngeal Swab     Status: None   Collection Time: 10/23/20  5:15 PM   Specimen: Nasopharyngeal Swab; Nasopharyngeal(NP) swabs in vial transport medium  Result Value Ref Range Status   SARS Coronavirus 2 by RT PCR NEGATIVE NEGATIVE Final    Comment: (NOTE) SARS-CoV-2  target nucleic acids are NOT DETECTED.  The SARS-CoV-2 RNA is generally detectable in upper respiratory specimens during the acute phase of infection. The lowest concentration of SARS-CoV-2 viral copies this assay can detect is 138 copies/mL. A negative result does not preclude SARS-Cov-2 infection and should not be used as the sole basis for treatment or other patient management decisions. A negative result may occur with  improper specimen collection/handling, submission of specimen other than nasopharyngeal swab, presence of viral mutation(s) within the areas targeted by this assay, and inadequate number of viral copies(<138 copies/mL). A negative result must be combined with clinical observations, patient history, and epidemiological information. The expected result is Negative.  Fact Sheet for Patients:  EntrepreneurPulse.com.au  Fact Sheet for Healthcare Providers:  IncredibleEmployment.be  This test is no t yet approved or cleared by the [place] FDA and  has been authorized for detection and/or diagnosis of SARS-CoV-2 by FDA under an Emergency Use Authorization (EUA). This EUA will remain  in effect (meaning this test can be used) for the duration of the COVID-19 declaration under Section 564(b)(1) of the Act, 21 U.S.C.section 360bbb-3(b)(1), unless the authorization is terminated  or revoked sooner.       Influenza A by PCR NEGATIVE NEGATIVE Final   Influenza B by PCR NEGATIVE NEGATIVE Final    Comment: (NOTE) The Xpert Xpress SARS-CoV-2/FLU/RSV plus assay is intended as an aid in the diagnosis of influenza from Nasopharyngeal swab specimens and should not be used as a sole basis for treatment. Nasal washings and aspirates are unacceptable for Xpert Xpress SARS-CoV-2/FLU/RSV testing.  Fact Sheet for Patients: EntrepreneurPulse.com.au  Fact Sheet for Healthcare  Providers: IncredibleEmployment.be  This test is not yet approved or cleared by the [place] FDA and has been authorized for detection and/or diagnosis of SARS-CoV-2 by FDA under an Emergency Use Authorization (EUA). This EUA will remain in effect (meaning this test can be used) for the duration of the COVID-19 declaration under Section 564(b)(1) of the Act, 21 U.S.C. section 360bbb-3(b)(1), unless the authorization is terminated or revoked.  Performed at [facility], [place] [address] [place],  [address]  Imaging Studies   CT ABDOMEN PELVIS WO CONTRAST  Result Date: 10/24/2020 CLINICAL DATA:  Complicated pyelonephritis. Fever. Weakness. Recent diagnosis of UTI. EXAM: CT ABDOMEN AND PELVIS WITHOUT CONTRAST TECHNIQUE: Multidetector CT imaging of the abdomen and pelvis was performed following the standard protocol without IV contrast. COMPARISON:  CT 09/09/2018 FINDINGS: Lower chest: Chronic tree in bud opacities in the right lower and middle lobes, but improved from prior exam. Probable lingular bronchiectasis. There is a 10 mm medial right lower lobe pulmonary nodule, series 4, image 5, new from prior. This appears adjacent to bronchus and may represent an area of mucoid plugging. Hepatobiliary: Similar appearance of cyst in the right lobe of the liver from prior exam allowing for lack of IV contrast. No new hepatic abnormality. Question of layering sludge in the gallbladder. No calcified gallstone or pericholecystic inflammation. No biliary dilatation. Pancreas: Mild parenchymal atrophy. No ductal dilatation or inflammation. Spleen: Normal in size without focal abnormality. Adrenals/Urinary Tract: No adrenal nodule. There is right perinephric edema. No hydronephrosis. No evidence of renal fluid collection. Left renal cyst was better appreciated on prior contrast-enhanced exam. Mild medial left renal scarring. No renal calculi. Wall thickening  of the anterior bladder dome. Mild adjacent perivesicular fat stranding. Stomach/Bowel: Decompressed stomach. No small bowel obstruction or inflammatory change. Appendectomy. Small to moderate volume of colonic stool. There is colonic redundancy. Scattered colonic diverticula from the splenic flexure distally, most prominently affecting the sigmoid. Mild sigmoid mural hypertrophy without evidence of acute diverticulitis or colonic inflammation. Vascular/Lymphatic: Aortic and branch atherosclerosis. No aortic aneurysm. No bulky abdominopelvic adenopathy. Reproductive: Uterus and bilateral adnexa are unremarkable. Other: No ascites or free air.  No abdominal wall hernia. Musculoskeletal: Multilevel degenerative change in the spine. Degenerative change of both hips. The bones appear under mineralized. There are no acute or suspicious osseous abnormalities. Right gluteal calcified subcutaneous granuloma, unchanged. IMPRESSION: 1. Bladder wall thickening as well as right perinephric edema, suspicious for cystitis and urinary tract infection. No hydronephrosis. No evidence of renal abscess. 2. Colonic diverticulosis without acute inflammation. 3. Chronic tree in bud opacities in the right lower and middle lobes, improved from prior exam. There is a 10 mm medial right lower lobe pulmonary nodule which appears adjacent to bronchus and may represent an area of mucoid plugging, new from prior. Consider follow-up chest CT in 3 months to assess for stability. Aortic Atherosclerosis (ICD10-I70.0). Electronically Signed   By: Keith Rake M.D.   On: 10/24/2020 00:38   DG Chest 2 View  Result Date: 10/23/2020 CLINICAL DATA:  Fever, cough EXAM: CHEST - 2 VIEW COMPARISON:  Chest radiograph from earlier today. FINDINGS: Loop recorder overlies the left heart. Stable cardiomediastinal silhouette with normal heart size. No pneumothorax. No pleural effusion. Chronic patchy nodular opacities in right mid lung are unchanged.  Irregular tram track lucencies in both lungs compatible with known bronchiectasis. No superimposed acute consolidative airspace disease. IMPRESSION: 1. No acute cardiopulmonary disease. 2. Chronic patchy nodular opacities in the right mid lung and known chronic bronchiectasis, unchanged. Electronically Signed   By: Ilona Sorrel M.D.   On: 10/23/2020 18:33   DG Chest Port 1 View  Result Date: 10/23/2020 CLINICAL DATA:  Cough, fever EXAM: PORTABLE CHEST 1 VIEW COMPARISON:  10/11/2020 FINDINGS: Stable cardiomediastinal contours. Loop recorder device is present. Redemonstrated scattered areas of bronchiectasis and mucous plugging, most pronounced within the right mid lung. Previously seen left mid lung nodule appears less conspicuous compared to the previous study. No new focal airspace consolidation.  No pleural effusion. Probable skin fold overlying the peripheral aspect of the right mid lung. Otherwise, no evidence of pneumothorax. IMPRESSION: 1. Probable skin fold overlying the peripheral aspect of the right mid lung. Consider repeat study after patient repositioning to exclude the possibility of a small pneumothorax. 2. Chronic scattered areas of bronchiectasis and mucous plugging. No new focal airspace consolidation. 3. Previously seen left mid lung nodule appears less conspicuous compared to the previous study. Electronically Signed   By: Davina Poke D.O.   On: 10/23/2020 17:59     Medications   Scheduled Meds: . apixaban  2.5 mg Oral BID  . famotidine  20 mg Oral Daily  . feeding supplement  237 mL Oral BID BM  . folic acid  1 mg Oral Daily  . pantoprazole  40 mg Oral Daily   Continuous Infusions: . meropenem (MERREM) IV 1 g (10/24/20 0249)       LOS: 0 days    Time spent: 30 minutes    Ezekiel Slocumb, DO [facility]  10/24/2020, 3:19 PM      If 7PM-7AM, please contact night-coverage. How to contact the [facility] Attending or Consulting provider [place] or covering  provider during after hours [place], for this patient?    1. Check the care team in [facility] and look for a) attending/consulting TRH provider listed and b) the [facility] team listed 2. Log into [email or web address] and use [place]'s universal password to access. If you do not have the password, please contact the hospital operator. 3. Locate the [facility] provider you are looking for under [facility] and page to a number that you can be directly reached. 4. If you still have difficulty reaching the provider, please page the [facility] (Director on Call) for the Hospitalists listed on amion for assistance.

## 2020-10-25 DIAGNOSIS — N10 Acute tubulo-interstitial nephritis: Secondary | ICD-10-CM | POA: Diagnosis not present

## 2020-10-25 LAB — BASIC METABOLIC PANEL
Anion gap: 5 (ref 5–15)
BUN: 13 mg/dL (ref 8–23)
CO2: 25 mmol/L (ref 22–32)
Calcium: 8.4 mg/dL — ABNORMAL LOW (ref 8.9–10.3)
Chloride: 105 mmol/L (ref 98–111)
Creatinine, Ser: 0.66 mg/dL (ref 0.44–1.00)
GFR, Estimated: >60 mL/min (ref >60)
Glucose, Bld: 96 mg/dL (ref 70–99)
Potassium: 4 mmol/L (ref 3.5–5.1)
Sodium: 135 mmol/L (ref 135–145)

## 2020-10-25 LAB — GLUCOSE, CAPILLARY: Glucose-Capillary: 82 mg/dL (ref 70–99)

## 2020-10-25 LAB — URINE CULTURE: Culture: 50000 — AB

## 2020-10-25 MED ORDER — LABETALOL HCL 5 MG/ML IV SOLN: 5.0000 mg | INTRAVENOUS | Status: DC | PRN

## 2020-10-25 MED ORDER — LABETALOL HCL 5 MG/ML IV SOLN
5.0000 mg | INTRAVENOUS | Status: DC | PRN
  Administered 2020-10-26 (×2): 5 mg via INTRAVENOUS
  Filled 2020-10-25 (×2): qty 4

## 2020-10-25 MED ORDER — ADULT MULTIVITAMIN W/MINERALS CH
1.0000 | ORAL_TABLET | Freq: Every day | ORAL | Status: DC
  Administered 2020-10-26: 1 via ORAL
  Filled 2020-10-25 (×2): qty 1

## 2020-10-25 NOTE — Evaluation (Signed)
Physical Therapy Evaluation Patient Details Name: [name] MRN: [id number] DOB: 01/30/1933 Today's Date: 10/25/2020   History of Present Illness  87 yo female who presented to ED via EMS for evaluation of ongoing dysuria despite outpatient Macrobid started the day before, fevers, progressive generalized weakness. Dx of acute pyelonephritis and bronchiectasis.  Also reported worsening of her chronic cough. with past history of resistant E. Coli UTI's, paroxysmal A-fib, bronchiectasis, GERD, osteoarthritis.  Clinical Impression  Pt ambulated 200' without an assistive device, no loss of balance, HR 109 walking. Pt is ready to DC home from PT standpoint, no further PT indicated as she is mobilizing independently. Will sign off. Encouraged pt to ambulate in halls TID to minimize deconditioning during hospitalization.     Follow Up Recommendations No PT follow up    Equipment Recommendations  None recommended by PT    Recommendations for Other Services       Precautions / Restrictions Precautions Precaution Comments: no falls in past 6 months Restrictions Weight Bearing Restrictions: No      Mobility  Bed Mobility Overal bed mobility: Modified Independent             General bed mobility comments: used rail    Transfers Overall transfer level: Independent Equipment used: None                Ambulation/Gait Ambulation/Gait assistance: Independent Gait Distance (Feet): 200 Feet Assistive device: None Gait Pattern/deviations: WFL(Within Functional Limits) Gait velocity: WFL   General Gait Details: steady without assistive device, no loss of balance, HR 109, no dyspnea  Stairs            Wheelchair Mobility    Modified Rankin (Stroke Patients Only)       Balance Overall balance assessment: Independent                                           Pertinent Vitals/Pain Pain Assessment: 0-10 Pain Score: 1  Pain Location: L hip  (chronic 2* OA per pt) Pain Descriptors / Indicators: Aching Pain Intervention(s): Limited activity within patient's tolerance;Monitored during session    Home Living Family/patient expects to be discharged to:: Private residence Living Arrangements: Alone Available Help at Discharge: Family;Available PRN/intermittently   Home Access: Stairs to enter   Entrance Stairs-Number of Steps: 2 Home Layout: Two level;Bed/bath upstairs        Prior Function Level of Independence: Independent               Hand Dominance        Extremity/Trunk Assessment   Upper Extremity Assessment Upper Extremity Assessment: Overall WFL for tasks assessed    Lower Extremity Assessment Lower Extremity Assessment: Overall WFL for tasks assessed    Cervical / Trunk Assessment Cervical / Trunk Assessment: Normal  Communication   Communication: HOH  Cognition Arousal/Alertness: Awake/alert Behavior During Therapy: WFL for tasks assessed/performed Overall Cognitive Status: Within Functional Limits for tasks assessed                                        General Comments      Exercises     Assessment/Plan    PT Assessment Patent does not need any further PT services  PT Problem List  PT Treatment Interventions      PT Goals (Current goals can be found in the Care Plan section)  Acute Rehab PT Goals 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 PT Goal Formulation: All assessment and education complete, DC therapy    Frequency     Barriers to discharge        Co-evaluation               AM-PAC PT "6 Clicks" Mobility  Outcome Measure Help needed turning from your back to your side while in a flat bed without using bedrails?: None Help needed moving from lying on your back to sitting on the side of a flat bed without using bedrails?: None Help needed moving to and from a bed to a chair (including a wheelchair)?: None Help needed  standing up from a chair using your arms (e.g., wheelchair or bedside chair)?: None Help needed to walk in hospital room?: None Help needed climbing 3-5 steps with a railing? : None 6 Click Score: 24    End of Session Equipment Utilized During Treatment: Gait belt Activity Tolerance: Patient tolerated treatment well Patient left: in chair;with call bell/phone within reach Nurse Communication: Mobility status      Time: 8453-6468 PT Time Calculation (min) (ACUTE ONLY): 20 min   Charges:   PT Evaluation $PT Eval Low Complexity: 1 Low          Philomena Doheny PT 10/25/2020  Acute Rehabilitation Services Pager [phone] Office [phone]

## 2020-10-25 NOTE — Significant Event (Signed)
Rapid Response Event Note   Reason for Call : numbness in throat   Initial Focused Assessment: pt c/o feeling like heart was racing. Pt stated the numbness had subsided by the time Rapid Response RN arrived. Pt was alert and oriented x4. Pt exhibited facial symmetry with equal and reactive pupils. Pt strength was equal bilaterally in upper and lower extremities. Pt denied any numbness or tingling upon assessment.   Interventions:  EKG completed.  No significant event noted.   Plan of Care:  Advised pt RN to contact physician with pt complaint and EKG results for interpretation. Encouraged RN to contact Rapid Response with any further needs BP upon completion 157/94 HR 93 O2 94 on RA.   Event Summary:   MD Notified: by bedside RN Call Time: [phone] Arrival Time: 1540 End Time: 0867  Julio Alm, RN

## 2020-10-25 NOTE — Progress Notes (Addendum)
PROGRESS NOTE    [name]   [id number]  DOB: 05-22-1933  PCP: Orpah Melter, MD    DOA: 10/23/2020 LOS: 1   Brief Narrative   87 yo female with past history of resistant E. Coli UTI's, paroxysmal A-fib, bronchiectasis, GERD, osteoarthritis  who presented to ED via EMS for evaluation of ongoing dysuria despite outpatient Macrobid started the day before, fevers, progressive generalized weakness.  Also reported worsening of her chronic cough over past day.  Evaluation in the ED, afebrile, stable vitals but BP elevated at 167/69.  Labs with leukocytosis 12.8k but otherwise unremarkable and lactate normal.  UA possibly infected, reflexed to culture.  CT abd/pelvis showed bladder wall thickening and Right perinephric edema consistent with cystitis and pyelonephritis.  Admitted to the hospital and started on empiric IV Merrem, based on recent culture data while culture here is pending.    Assessment & Plan   Principal Problem:   Acute pyelonephritis Active Problems:   Obstructive bronchiectasis (HCC)   AF (paroxysmal atrial fibrillation) (HCC)   GERD without esophagitis   Pyelonephritis   Acute pyelonephritis, right-sided -presented as outlined above.  Did not meet sepsis criteria on admission.  History of resistant E. coli UTIs.  Failed to improve with outpatient Macrobid. -- Continue empiric meropenem -- Follow-up urine culture (reincubated) -- Follow up blood culture -- Off IV fluids  Generalized weakness - due to above. --PT eval  Chronic bronchiectasis -with secondary chronic cough which appears somewhat flared per patient. -- Scheduled Mucinex -- Flutter valve  Allergic reaction - to hydralazine.  Added this to her Allergy profile in chart.  Symptoms resolved spontaneously (throat numb/tingling, heart racing, SOB)  Paroxysmal A. Fib -currently rate controlled.  Has implantable loop recorder.  Follows with cardiologist Dr. Acie Fredrickson. -- Continue Eliquis,  diltiazem  GERD without esophagitis -continue PPI  Patient BMI: There is no height or weight on file to calculate BMI.   DVT prophylaxis: apixaban (ELIQUIS) tablet 2.5 mg Start: 10/23/20 2230 apixaban (ELIQUIS) tablet 2.5 mg   Diet:  Diet Orders (From admission, onward)    Start     Ordered   10/23/20 2107  Diet Heart Room service appropriate? Yes; Fluid consistency: Thin  Diet effective now       Question Answer Comment  Room service appropriate? Yes   Fluid consistency: Thin      10/23/20 2107            Code Status: Full Code    Subjective 10/25/20    Patient was given PRN hydralazine for her BP early this AM and had an allergic reaction in which she felt heart race, throat numbness/tingling.  Rapid response was called.  Symptoms resolved spontaneously. When seen today, she reports feeling better.  Main concern is stlil feeling very weak.  No fever/chills.   Disposition Plan & Communication   Status is: Inpatient  Remains inpatient appropriate because:IV treatments appropriate due to intensity of illness or inability to take PO.  Urine culture pending, history of resistant organisms.   Dispo: The patient is from: Home              Anticipated d/c is to: Home              Patient currently is not medically stable to d/c.   Difficult to place patient No   Consults, Procedures, Significant Events   Consultants:   None  Procedures:   None  Antimicrobials:  Anti-infectives (From admission, onward)   Start  Dose/Rate Route Frequency Ordered Stop   10/24/20 0300  meropenem (MERREM) 1 g in sodium chloride 0.9 % 100 mL IVPB        1 g 200 mL/hr over 30 Minutes Intravenous Every 12 hours 10/24/20 0155     10/23/20 1730  meropenem (MERREM) 1 g in sodium chloride 0.9 % 100 mL IVPB        1 g 200 mL/hr over 30 Minutes Intravenous  Once 10/23/20 1710 10/23/20 1752   10/23/20 1715  meropenem (MERREM) 1 g in sodium chloride 0.9 % 100 mL IVPB  Status:  Discontinued         1 g 200 mL/hr over 30 Minutes Intravenous Every 8 hours 10/23/20 1708 10/23/20 1710        Micro    Objective   Vitals:   10/25/20 0607 10/25/20 0632 10/25/20 0634 10/25/20 1254  BP: (!) 157/77 (!) 171/91 (!) 157/94 130/76  Pulse:    (!) 101  Resp:    17  Temp:    98.4 F (36.9 C)  TempSrc:    Oral  SpO2:    96%    Intake/Output Summary (Last 24 hours) at 10/25/2020 1617 Last data filed at 10/25/2020 1258 Gross per 24 hour  Intake 680 ml  Output --  Net 680 ml   There were no vitals filed for this visit.  Physical Exam:  General exam: awake, alert, no acute distress Respiratory system: CTAB but deep inspirations trigger cough which sounds nonproductive Cardiovascular system: normal S1/S2, RRR, no pedal edema.   Gastrointestinal system: soft, non-tender on palpation Central nervous system: A&O x 4. Normal speech. Extremities: moves all, no edema, normal tone Psychiatry: normal mood, congruent affect, judgement and insight appear normal  Labs   Data Reviewed: I have personally reviewed following labs and imaging studies  CBC: Recent Labs  Lab 10/23/20 1715 10/24/20 0530  WBC 12.8* 9.5  NEUTROABS 11.8* 8.2*  HGB 13.4 13.2  HCT 39.8 39.3  MCV 93.4 93.8  PLT 261 [id number]   Basic Metabolic Panel: Recent Labs  Lab 10/23/20 1715 10/24/20 0530 10/25/20 0510  NA 135 135 135  K 3.5 3.7 4.0  CL 106 106 105  CO2 22 24 25   GLUCOSE 110* 107* 96  BUN 14 12 13   CREATININE 0.70 0.58 0.66  CALCIUM 8.2* 8.5* 8.4*  MG  --  1.9  --    GFR: CrCl cannot be calculated (Unknown ideal weight.). Liver Function Tests: Recent Labs  Lab 10/23/20 1715 10/24/20 0530  AST 17 15  ALT 14 13  ALKPHOS 44 42  BILITOT 0.5 0.6  PROT 5.9* 6.0*  ALBUMIN 3.0* 2.9*   No results for input(s): LIPASE, AMYLASE in the last 168 hours. No results for input(s): AMMONIA in the last 168 hours. Coagulation Profile: Recent Labs  Lab 10/23/20 1715  INR 1.1   Cardiac Enzymes: No  results for input(s): CKTOTAL, CKMB, CKMBINDEX, TROPONINI in the last 168 hours. BNP (last 3 results) No results for input(s): PROBNP in the last 8760 hours. HbA1C: No results for input(s): HGBA1C in the last 72 hours. CBG: Recent Labs  Lab 10/25/20 0613  GLUCAP 82   Lipid Profile: No results for input(s): CHOL, HDL, LDLCALC, TRIG, CHOLHDL, LDLDIRECT in the last 72 hours. Thyroid Function Tests: No results for input(s): TSH, T4TOTAL, FREET4, T3FREE, THYROIDAB in the last 72 hours. Anemia Panel: No results for input(s): VITAMINB12, FOLATE, FERRITIN, TIBC, IRON, RETICCTPCT in the last 72 hours. Sepsis Labs: Recent  Labs  Lab 10/23/20 1715  LATICACIDVEN 1.1    Recent Results (from the past 240 hour(s))  Urine culture     Status: Abnormal   Collection Time: 10/23/20  5:02 PM   Specimen: In/Out Cath Urine  Result Value Ref Range Status   Specimen Description   Final    IN/OUT CATH URINE Performed at [facility] [address], [place], [place] [address]    Special Requests   Final    NONE Performed at [facility], [place] [address], [place], [place] [address]    Culture (A)  Final    50,000 COLONIES/mL DIPHTHEROIDS(CORYNEBACTERIUM SPECIES) Standardized susceptibility testing for this organism is not available. Performed at [facility], [place] [address], [place], [place] [address]    Report Status 10/25/2020 FINAL  Final  Blood Culture (routine x 2)     Status: None (Preliminary result)   Collection Time: 10/23/20  5:15 PM   Specimen: BLOOD  Result Value Ref Range Status   Specimen Description   Final    BLOOD LEFT FOREARM Performed at [facility] [address], [place], [place] [address]    Special Requests   Final    BOTTLES DRAWN AEROBIC AND ANAEROBIC Blood Culture adequate volume Performed at [facility] [address], [place], [place] [address]    Culture   Final    NO GROWTH 2  DAYS Performed at [facility] [address] [place], [place] [address]    Report Status PENDING  Incomplete  Resp Panel by RT-PCR (Flu A&B, Covid) Nasopharyngeal Swab     Status: None   Collection Time: 10/23/20  5:15 PM   Specimen: Nasopharyngeal Swab; Nasopharyngeal(NP) swabs in vial transport medium  Result Value Ref Range Status   SARS Coronavirus 2 by RT PCR NEGATIVE NEGATIVE Final    Comment: (NOTE) SARS-CoV-2 target nucleic acids are NOT DETECTED.  The SARS-CoV-2 RNA is generally detectable in upper respiratory specimens during the acute phase of infection. The lowest concentration of SARS-CoV-2 viral copies this assay can detect is 138 copies/mL. A negative result does not preclude SARS-Cov-2 infection and should not be used as the sole basis for treatment or other patient management decisions. A negative result may occur with  improper specimen collection/handling, submission of specimen other than nasopharyngeal swab, presence of viral mutation(s) within the areas targeted by this assay, and inadequate number of viral copies(<138 copies/mL). A negative result must be combined with clinical observations, patient history, and epidemiological information. The expected result is Negative.  Fact Sheet for Patients:  EntrepreneurPulse.com.au  Fact Sheet for Healthcare Providers:  IncredibleEmployment.be  This test is no t yet approved or cleared by the [place] FDA and  has been authorized for detection and/or diagnosis of SARS-CoV-2 by FDA under an Emergency Use Authorization (EUA). This EUA will remain  in effect (meaning this test can be used) for the duration of the COVID-19 declaration under Section 564(b)(1) of the Act, 21 U.S.C.section 360bbb-3(b)(1), unless the authorization is terminated  or revoked sooner.       Influenza A by PCR NEGATIVE NEGATIVE Final   Influenza B by PCR NEGATIVE NEGATIVE Final    Comment:  (NOTE) The Xpert Xpress SARS-CoV-2/FLU/RSV plus assay is intended as an aid in the diagnosis of influenza from Nasopharyngeal swab specimens and should not be used as a sole basis for treatment. Nasal washings and aspirates are unacceptable for Xpert Xpress SARS-CoV-2/FLU/RSV testing.  Fact Sheet for Patients: EntrepreneurPulse.com.au  Fact Sheet for Healthcare Providers: IncredibleEmployment.be  This test is not yet approved or cleared by the [place] FDA and has been authorized for detection and/or diagnosis of SARS-CoV-2 by FDA under an Emergency Use Authorization (EUA). This EUA will remain in effect (meaning this test can be used) for the duration of the COVID-19 declaration under Section 564(b)(1) of the Act, 21 U.S.C. section 360bbb-3(b)(1), unless the authorization is terminated or revoked.  Performed at [facility] [place] [address] [place], [place] [address]       Imaging Studies   CT ABDOMEN PELVIS WO CONTRAST  Result Date: 10/24/2020 CLINICAL DATA:  Complicated pyelonephritis. Fever. Weakness. Recent diagnosis of UTI. EXAM: CT ABDOMEN AND PELVIS WITHOUT CONTRAST TECHNIQUE: Multidetector CT imaging of the abdomen and pelvis was performed following the standard protocol without IV contrast. COMPARISON:  CT 09/09/2018 FINDINGS: Lower chest: Chronic tree in bud opacities in the right lower and middle lobes, but improved from prior exam. Probable lingular bronchiectasis. There is a 10 mm medial right lower lobe pulmonary nodule, series 4, image 5, new from prior. This appears adjacent to bronchus and may represent an area of mucoid plugging. Hepatobiliary: Similar appearance of cyst in the right lobe of the liver from prior exam allowing for lack of IV contrast. No new hepatic abnormality. Question of layering sludge in the gallbladder. No calcified gallstone or pericholecystic inflammation. No biliary dilatation.  Pancreas: Mild parenchymal atrophy. No ductal dilatation or inflammation. Spleen: Normal in size without focal abnormality. Adrenals/Urinary Tract: No adrenal nodule. There is right perinephric edema. No hydronephrosis. No evidence of renal fluid collection. Left renal cyst was better appreciated on prior contrast-enhanced exam. Mild medial left renal scarring. No renal calculi. Wall thickening of the anterior bladder dome. Mild adjacent perivesicular fat stranding. Stomach/Bowel: Decompressed stomach. No small bowel obstruction or inflammatory change. Appendectomy. Small to moderate volume of colonic stool. There is colonic redundancy. Scattered colonic diverticula from the splenic flexure distally, most prominently affecting the sigmoid. Mild sigmoid mural hypertrophy without evidence of acute diverticulitis or colonic inflammation. Vascular/Lymphatic: Aortic and branch atherosclerosis. No aortic aneurysm. No bulky abdominopelvic adenopathy. Reproductive: Uterus and bilateral adnexa are unremarkable. Other: No ascites or free air.  No abdominal wall hernia. Musculoskeletal: Multilevel degenerative change in the spine. Degenerative change of both hips. The bones appear under mineralized. There are no acute or suspicious osseous abnormalities. Right gluteal calcified subcutaneous granuloma, unchanged. IMPRESSION: 1. Bladder wall thickening as well as right perinephric edema, suspicious for cystitis and urinary tract infection. No hydronephrosis. No evidence of renal abscess. 2. Colonic diverticulosis without acute inflammation. 3. Chronic tree in bud opacities in the right lower and middle lobes, improved from prior exam. There is a 10 mm medial right lower lobe pulmonary nodule which appears adjacent to bronchus and may represent an area of mucoid plugging, new from prior. Consider follow-up chest CT in 3 months to assess for stability. Aortic Atherosclerosis (ICD10-I70.0). Electronically Signed   By: Keith Rake M.D.   On: 10/24/2020 00:38   DG Chest 2 View  Result Date: 10/23/2020 CLINICAL DATA:  Fever, cough EXAM: CHEST - 2 VIEW COMPARISON:  Chest radiograph from earlier today. FINDINGS: Loop recorder overlies the left heart. Stable cardiomediastinal silhouette with normal heart size. No pneumothorax. No pleural effusion. Chronic patchy nodular opacities in right mid lung are unchanged. Irregular tram track lucencies in both lungs compatible with known bronchiectasis. No superimposed acute consolidative airspace disease. IMPRESSION: 1.  No acute cardiopulmonary disease. 2. Chronic patchy nodular opacities in the right mid lung and known chronic bronchiectasis, unchanged. Electronically Signed   By: Ilona Sorrel M.D.   On: 10/23/2020 18:33   DG Chest Port 1 View  Result Date: 10/23/2020 CLINICAL DATA:  Cough, fever EXAM: PORTABLE CHEST 1 VIEW COMPARISON:  10/11/2020 FINDINGS: Stable cardiomediastinal contours. Loop recorder device is present. Redemonstrated scattered areas of bronchiectasis and mucous plugging, most pronounced within the right mid lung. Previously seen left mid lung nodule appears less conspicuous compared to the previous study. No new focal airspace consolidation. No pleural effusion. Probable skin fold overlying the peripheral aspect of the right mid lung. Otherwise, no evidence of pneumothorax. IMPRESSION: 1. Probable skin fold overlying the peripheral aspect of the right mid lung. Consider repeat study after patient repositioning to exclude the possibility of a small pneumothorax. 2. Chronic scattered areas of bronchiectasis and mucous plugging. No new focal airspace consolidation. 3. Previously seen left mid lung nodule appears less conspicuous compared to the previous study. Electronically Signed   By: Davina Poke D.O.   On: 10/23/2020 17:59     Medications   Scheduled Meds: . apixaban  2.5 mg Oral BID  . dextromethorphan-guaiFENesin  1 tablet Oral BID  . famotidine  20  mg Oral Daily  . feeding supplement  237 mL Oral BID BM  . folic acid  1 mg Oral Daily  . multivitamin with minerals  1 tablet Oral Daily  . pantoprazole  40 mg Oral Daily   Continuous Infusions: . meropenem (MERREM) IV 1 g (10/25/20 1502)       LOS: 1 day    Time spent: 30 minutes with >50% spent at bedside and in coordination of care.    Ezekiel Slocumb, DO [facility]  10/25/2020, 4:17 PM      If 7PM-7AM, please contact night-coverage. How to contact the [facility] Attending or Consulting provider [place] or covering provider during after hours [place], for this patient?    1. Check the care team in [facility] and look for a) attending/consulting [facility] provider listed and b) the [facility] team listed 2. Log into [email or web address] and use 's universal password to access. If you do not have the password, please contact the hospital operator. 3. Locate the [facility] provider you are looking for under [facility] and page to a number that you can be directly reached. 4. If you still have difficulty reaching the provider, please page the [facility] (Director on Call) for the Hospitalists listed on amion for assistance.

## 2020-10-25 NOTE — Progress Notes (Signed)
Patient began feeling short of breath, when this nurse assessed patient stated she felt as if she was having a reaction to something. Charge RN was called to room. Patient stated she then felt dizzy and light headed, had some left sided abdominal discomfort and hip pain. Patient was anxious and shaking. After charge RN assessment involving stroke symptom assessment, a decision was made to call rapid response nurse for another assessment. CBG was 86. A 12 lead EKG was obtained and reviewed by the charge RN and rapid response RN. Vital signs were stable, and did not meet hydralazine parameters at the time. After reviewing VS, EKG, CGB, symptoms began to subside and patient stated she felt better.    10/25/20 0607  Vitals  BP (!) 157/77  ECG Heart Rate 98  Provider Notification  Provider Name/Title Jeannette Corpus  Date Provider Notified 10/25/20  Time Provider Notified [phone]  Notification Type Page  Notification Reason Change in status (Patient felt difficulty breathing, trembling, just had BP meds, worried about BP, dizzy, lightheaded, and numb tongue)  Rapid Response Notification  Name of Rapid Response RN Notified Danielle RN  Date Rapid Response Notified 10/25/20  Time Rapid Response Notified [phone]

## 2020-10-25 NOTE — Progress Notes (Signed)
PHARMACY NOTE -  Meropenem  Pharmacy has been assisting with dosing of meropenem for ESBL UTI.  Dosage remains stable at 1g IV q12 hr and further renal adjustments per institutional Pharmacy antibiotic protocol  Pharmacy will sign off, following peripherally for culture results or dose adjustments. Please reconsult if a change in clinical status warrants re-evaluation of dosage.  Reuel Boom, PharmD, BCPS [phone] 10/25/2020, 10:30 AM

## 2020-10-25 NOTE — Progress Notes (Signed)
Initial Nutrition Assessment  DOCUMENTATION CODES:   Not applicable  INTERVENTION:   -Ensure Enlive po BID, each supplement provides 350 kcal and 20 grams of protein -MVI with minerals daily  NUTRITION DIAGNOSIS:   Increased nutrient needs related to acute illness (acute pyelonephritis) as evidenced by estimated needs.  GOAL:   Patient will meet greater than or equal to 90% of their needs  MONITOR:   PO intake,Supplement acceptance,Labs,Weight trends,Skin,I & O's  REASON FOR ASSESSMENT:   Malnutrition Screening Tool    ASSESSMENT:   87 year old female with past medical history of paroxysmal atrial fibrillation, bronchiectasis, gastroesophageal reflux disease, osteoarthritis who presents to [facility] emergency department with complaints of dysuria and weakness.  Pt admitted with acute pyelonephritis.   Reviewed I/O's: +620 ml x 24 hours and +1.3 L since admission  Pt unavailable at time of visit. Attempted to speak with pt via call to hospital room phone, however, unable to reach. RD unable to obtain further nutrition-related history or complete nutrition-focused physical exam at this time.   Pt with good appetite. Noted meal completion 60-100%.   Reviewed wt hx; pt has experienced a 6.6% wt loss over the past 6 months, which is not significant for time frame.   Labs reviewed: CBGS: 82.   Diet Order:   Diet Order            Diet Heart Room service appropriate? Yes; Fluid consistency: Thin  Diet effective now                 EDUCATION NEEDS:   No education needs have been identified at this time  Skin:  Skin Assessment: Reviewed RN Assessment  Last BM:  10/24/20  Height:   Ht Readings from Last 1 Encounters:  10/11/20 5\' 4"  (1.626 m)    Weight:   Wt Readings from Last 1 Encounters:  10/11/20 53.5 kg    Ideal Body Weight:  54.5 kg  BMI:  There is no height or weight on file to calculate BMI.  Estimated Nutritional Needs:   Kcal:   1400-1600  Protein:  65-80 grams  Fluid:  > 1.4 L    Loistine Chance, RD, LDN, [place] Registered Dietitian II Certified Diabetes Care and Education Specialist Please refer to [facility] for RD and/or RD on-call/weekend/after hours pager

## 2020-10-25 NOTE — Progress Notes (Signed)
Pt alert and aware sitting up on the edge of bed. She states she is doing better today. The chaplain offered caring and supportive presence, prayers and blessings.

## 2020-10-25 NOTE — Plan of Care (Signed)
  Problem: Activity: Goal: Risk for activity intolerance will decrease Outcome: Progressing   

## 2020-10-26 DIAGNOSIS — N10 Acute tubulo-interstitial nephritis: Secondary | ICD-10-CM | POA: Diagnosis not present

## 2020-10-26 MED ORDER — ENSURE ENLIVE PO LIQD: 237.0000 mL | Freq: Two times a day (BID) | ORAL | 12 refills | Status: DC

## 2020-10-26 MED ORDER — DOXYCYCLINE HYCLATE 100 MG PO TABS
100.0000 mg | ORAL_TABLET | Freq: Two times a day (BID) | ORAL | Status: DC
  Administered 2020-10-26: 100 mg via ORAL
  Filled 2020-10-26: qty 1

## 2020-10-26 MED ORDER — DOXYCYCLINE HYCLATE 100 MG PO TABS: 100.0000 mg | ORAL_TABLET | Freq: Two times a day (BID) | ORAL | 0 refills | Status: AC

## 2020-10-26 MED ORDER — PHENAZOPYRIDINE HCL 97.2 MG PO TABS: 97.0000 mg | ORAL_TABLET | Freq: Three times a day (TID) | ORAL | 0 refills | Status: AC | PRN

## 2020-10-26 NOTE — Progress Notes (Incomplete)
Order parameters for labetalol were met at 2125 and again at 2211, both times patient refused and requested reassessment in one hour. Pt is nervous to take more BP medication after allergy to hydralazine. This nurse explained the differences in the medication classes. After 2-3 reassessments patient decided to follow through with the prn BP medication at 0029 (5/24).

## 2020-10-26 NOTE — Discharge Summary (Signed)
Physician Discharge Summary  [name] BTD:[id number] DOB: 23-Jan-1933 DOA: 10/23/2020  PCP: Orpah Melter, MD  Admit date: 10/23/2020 Discharge date: 10/26/2020  Admitted From: home Disposition:  home  Recommendations for Outpatient Follow-up:  1. Follow up with PCP in 1-2 weeks 2. Please obtain BMP/CBC in one week 3. Please follow up with urology  Home Health: No, PT evaluated   Equipment/Devices: None   Discharge Condition: Stable  CODE STATUS: Full  Diet recommendation: Heart Healthy      Discharge Diagnoses: Principal Problem:   Acute pyelonephritis Active Problems:   Obstructive bronchiectasis (HCC)   AF (paroxysmal atrial fibrillation) (HCC)   GERD without esophagitis   Pyelonephritis    Summary of HPI and Hospital Course:  87 yo female with past history of resistant E. Coli UTI's, paroxysmal A-fib, bronchiectasis, GERD, osteoarthritis  who presented to ED via EMS for evaluation of ongoing dysuria despite outpatient Macrobid started the day before, fevers, progressive generalized weakness.  Also reported worsening of her chronic cough over past day.  Evaluation in the ED, afebrile, stable vitals but BP elevated at 167/69.  Labs with leukocytosis 12.8k but otherwise unremarkable and lactate normal.  UA possibly infected, reflexed to culture.  CT abd/pelvis showed bladder wall thickening and Right perinephric edema consistent with cystitis and pyelonephritis.  Admitted to the hospital and started on empiric IV Merrem, based on recent culture data while culture here is pending.      Acute pyelonephritis, right-sided -presented as outlined above.  Did not meet sepsis criteria on admission.  History of resistant E. coli UTIs.  Failed to improve with outpatient Macrobid. -- Treated with empiric meropenem -- Urine culture grew diphtheroids (corynebacterium sp.) for which susceptibility test is not available. -- d/w ID, recommended 3 additional days of  doxycycline. -- Blood culture neg to date - follow -- Off IV fluids, stable  Generalized weakness - due to above. --PT eval - no follow up recommended  Chronic bronchiectasis -with secondary chronic cough which appears somewhat flared per patient. -- Scheduled Mucinex -- Flutter valve  Allergic reaction - to hydralazine.  Added this to her Allergy profile in chart.  Symptoms resolved spontaneously (throat numb/tingling, heart racing, SOB)  Paroxysmal A. Fib -currently rate controlled.   Has implantable loop recorder.   Follows with cardiologist Dr. Acie Fredrickson. -- Continue Eliquis, diltiazem  GERD without esophagitis -continue PPI     Discharge Instructions   Discharge Instructions    Call MD for:  extreme fatigue   Complete by: As directed    Call MD for:  persistant dizziness or light-headedness   Complete by: As directed    Call MD for:  persistant nausea and vomiting   Complete by: As directed    Call MD for:  severe uncontrolled pain   Complete by: As directed    Call MD for:  temperature >100.4   Complete by: As directed    Diet - low sodium heart healthy   Complete by: As directed    Discharge instructions   Complete by: As directed    Take doxycycline as prescribed for  2-1/2 days to complete your full course of antibiotics.   Please see your urologist for follow up.    It is okay to use Azo / pyridium for a few days to help with the burning on urination.    If you continue to have the burning or discomfort, you may have "Interstitial Cystitis" - this is inflammation inside the bladder, that can feel exactly  like a UTI.   Increase activity slowly   Complete by: As directed      Allergies as of 10/26/2020      Reactions   Hydralazine Hcl Shortness Of Breath   With throat numbness/tingling and swelling   Ciprofloxacin Other (See Comments)   triggered a migraine that was thought to be a TIA   Penicillins Rash   Has patient had a PCN reaction causing  immediate rash, facial/tongue/throat swelling, SOB or lightheadedness with hypotension: Yes Has patient had a PCN reaction causing severe rash involving mucus membranes or skin necrosis: No Has patient had a PCN reaction that required hospitalization: No Has patient had a PCN reaction occurring within the last 10 years: No If all of the above answers are "NO", then may proceed with Cephalosporin use.   Sulfonamide Derivatives Swelling   Apixaban    Other reaction(s): weakness   Avelox [moxifloxacin] Other (See Comments)   Rash and severe tingling/numbness    Band-aid Infection Defense [bacitracin-polymyxin B] Other (See Comments)   unknown   Mannitol    Multaq [dronedarone]    Muscle aches   Nitrofurantoin Monohyd Macro    Other reaction(s): due to lung disease   Other Other (See Comments)   Water for injection, sterile - unknown   Prednisone    Other reaction(s): rash   Reclast [zoledronic Acid] Other (See Comments)   hypertension   Sulfa Antibiotics Other (See Comments)   unknown   Chlorpheniramine Other (See Comments)   Headache, stomach ache, bladder "not working well"      Medication List    STOP taking these medications   levofloxacin 500 MG tablet Commonly known as: LEVAQUIN   nitrofurantoin (macrocrystal-monohydrate) 100 MG capsule Commonly known as: MACROBID     TAKE these medications   acetaminophen 500 MG tablet Commonly known as: TYLENOL Take 1,000 mg by mouth every 6 (six) hours as needed for fever.   cholecalciferol 25 MCG (1000 UNIT) tablet Commonly known as: VITAMIN D3 Take 1,000 Units by mouth daily.   diltiazem 30 MG tablet Commonly known as: Cardizem Take 1 tablet (30 mg total) by mouth 4 (four) times daily as needed (for elevated heart rates).   doxycycline 100 MG tablet Commonly known as: VIBRA-TABS Take 1 tablet (100 mg total) by mouth every 12 (twelve) hours for 5 doses.   Eliquis 2.5 MG Tabs tablet Generic drug: apixaban TAKE 1  TABLET(2.5 MG) BY MOUTH TWICE DAILY What changed: See the new instructions.   famotidine 20 MG tablet Commonly known as: Pepcid One after supper What changed:   how much to take  how to take this  when to take this   feeding supplement Liqd Take 237 mLs by mouth 2 (two) times daily between meals.   folic acid 1 MG tablet Commonly known as: FOLVITE Take 1 mg by mouth daily.   HYDROcodone-acetaminophen 5-325 MG tablet Commonly known as: NORCO/VICODIN Take 1 tablet by mouth every 4 (four) hours as needed for moderate pain. What changed: when to take this   Mucinex DM 30-600 MG Tb12 Take 1 tablet by mouth daily.   ONE-A-DAY WOMENS 50 PLUS PO Take 1 tablet by mouth daily.   pantoprazole 40 MG tablet Commonly known as: Protonix Take 1 tablet (40 mg total) by mouth daily. Take 30-60 min before first meal of the day   phenazopyridine 97 MG tablet Commonly known as: PYRIDIUM Take 1 tablet (97 mg total) by mouth 3 (three) times daily as needed for  up to 3 days for pain.   vitamin C 100 MG tablet Take 100 mg by mouth daily.       Allergies  Allergen Reactions  . Hydralazine Hcl Shortness Of Breath    With throat numbness/tingling and swelling  . Ciprofloxacin Other (See Comments)    triggered a migraine that was thought to be a TIA  . Penicillins Rash    Has patient had a PCN reaction causing immediate rash, facial/tongue/throat swelling, SOB or lightheadedness with hypotension: Yes Has patient had a PCN reaction causing severe rash involving mucus membranes or skin necrosis: No Has patient had a PCN reaction that required hospitalization: No Has patient had a PCN reaction occurring within the last 10 years: No If all of the above answers are "NO", then may proceed with Cephalosporin use.  . Sulfonamide Derivatives Swelling  . Apixaban     Other reaction(s): weakness  . Avelox [Moxifloxacin] Other (See Comments)    Rash and severe tingling/numbness   . Band-Aid  Infection Defense [Bacitracin-Polymyxin B] Other (See Comments)    unknown  . Mannitol   . Multaq [Dronedarone]     Muscle aches  . Nitrofurantoin Monohyd Macro     Other reaction(s): due to lung disease  . Other Other (See Comments)    Water for injection, sterile - unknown  . Prednisone     Other reaction(s): rash  . Reclast [Zoledronic Acid] Other (See Comments)    hypertension  . Sulfa Antibiotics Other (See Comments)    unknown  . Chlorpheniramine Other (See Comments)    Headache, stomach ache, bladder "not working well"     If you experience worsening of your admission symptoms, develop shortness of breath, life threatening emergency, suicidal or homicidal thoughts you must seek medical attention immediately by calling 911 or calling your MD immediately  if symptoms less severe.    Please note   You were cared for by a hospitalist during your hospital stay. If you have any questions about your discharge medications or the care you received while you were in the hospital after you are discharged, you can call the unit and asked to speak with the hospitalist on call if the hospitalist that took care of you is not available. Once you are discharged, your primary care physician will handle any further medical issues. Please note that NO REFILLS for any discharge medications will be authorized once you are discharged, as it is imperative that you return to your primary care physician (or establish a relationship with a primary care physician if you do not have one) for your aftercare needs so that they can reassess your need for medications and monitor your lab values.   Consultations:  None    Procedures/Studies: CT ABDOMEN PELVIS WO CONTRAST  Result Date: 10/24/2020 CLINICAL DATA:  Complicated pyelonephritis. Fever. Weakness. Recent diagnosis of UTI. EXAM: CT ABDOMEN AND PELVIS WITHOUT CONTRAST TECHNIQUE: Multidetector CT imaging of the abdomen and pelvis was performed  following the standard protocol without IV contrast. COMPARISON:  CT 09/09/2018 FINDINGS: Lower chest: Chronic tree in bud opacities in the right lower and middle lobes, but improved from prior exam. Probable lingular bronchiectasis. There is a 10 mm medial right lower lobe pulmonary nodule, series 4, image 5, new from prior. This appears adjacent to bronchus and may represent an area of mucoid plugging. Hepatobiliary: Similar appearance of cyst in the right lobe of the liver from prior exam allowing for lack of IV contrast. No new hepatic abnormality.  Question of layering sludge in the gallbladder. No calcified gallstone or pericholecystic inflammation. No biliary dilatation. Pancreas: Mild parenchymal atrophy. No ductal dilatation or inflammation. Spleen: Normal in size without focal abnormality. Adrenals/Urinary Tract: No adrenal nodule. There is right perinephric edema. No hydronephrosis. No evidence of renal fluid collection. Left renal cyst was better appreciated on prior contrast-enhanced exam. Mild medial left renal scarring. No renal calculi. Wall thickening of the anterior bladder dome. Mild adjacent perivesicular fat stranding. Stomach/Bowel: Decompressed stomach. No small bowel obstruction or inflammatory change. Appendectomy. Small to moderate volume of colonic stool. There is colonic redundancy. Scattered colonic diverticula from the splenic flexure distally, most prominently affecting the sigmoid. Mild sigmoid mural hypertrophy without evidence of acute diverticulitis or colonic inflammation. Vascular/Lymphatic: Aortic and branch atherosclerosis. No aortic aneurysm. No bulky abdominopelvic adenopathy. Reproductive: Uterus and bilateral adnexa are unremarkable. Other: No ascites or free air.  No abdominal wall hernia. Musculoskeletal: Multilevel degenerative change in the spine. Degenerative change of both hips. The bones appear under mineralized. There are no acute or suspicious osseous abnormalities.  Right gluteal calcified subcutaneous granuloma, unchanged. IMPRESSION: 1. Bladder wall thickening as well as right perinephric edema, suspicious for cystitis and urinary tract infection. No hydronephrosis. No evidence of renal abscess. 2. Colonic diverticulosis without acute inflammation. 3. Chronic tree in bud opacities in the right lower and middle lobes, improved from prior exam. There is a 10 mm medial right lower lobe pulmonary nodule which appears adjacent to bronchus and may represent an area of mucoid plugging, new from prior. Consider follow-up chest CT in 3 months to assess for stability. Aortic Atherosclerosis (ICD10-I70.0). Electronically Signed   By: Keith Rake M.D.   On: 10/24/2020 00:38   DG Chest 2 View  Result Date: 10/23/2020 CLINICAL DATA:  Fever, cough EXAM: CHEST - 2 VIEW COMPARISON:  Chest radiograph from earlier today. FINDINGS: Loop recorder overlies the left heart. Stable cardiomediastinal silhouette with normal heart size. No pneumothorax. No pleural effusion. Chronic patchy nodular opacities in right mid lung are unchanged. Irregular tram track lucencies in both lungs compatible with known bronchiectasis. No superimposed acute consolidative airspace disease. IMPRESSION: 1. No acute cardiopulmonary disease. 2. Chronic patchy nodular opacities in the right mid lung and known chronic bronchiectasis, unchanged. Electronically Signed   By: Ilona Sorrel M.D.   On: 10/23/2020 18:33   DG Chest 2 View  Result Date: 10/12/2020 CLINICAL DATA:  Cough EXAM: CHEST - 2 VIEW COMPARISON:  08/02/2020, CT 04/04/2016 FINDINGS: Recording device over left chest. Bilateral bronchiectasis with similar nodular opacities in the right thorax. New nodular opacity in the left mid lung. Stable cardiomediastinal silhouette. No pneumothorax. IMPRESSION: Chronic bronchiectasis and similar appearing right nodularity. Potential new nodule within the left mid lung which may be inflammatory or infectious but  short interval radiographic follow-up is recommended. Electronically Signed   By: Donavan Foil M.D.   On: 10/12/2020 23:43   DG Chest Port 1 View  Result Date: 10/23/2020 CLINICAL DATA:  Cough, fever EXAM: PORTABLE CHEST 1 VIEW COMPARISON:  10/11/2020 FINDINGS: Stable cardiomediastinal contours. Loop recorder device is present. Redemonstrated scattered areas of bronchiectasis and mucous plugging, most pronounced within the right mid lung. Previously seen left mid lung nodule appears less conspicuous compared to the previous study. No new focal airspace consolidation. No pleural effusion. Probable skin fold overlying the peripheral aspect of the right mid lung. Otherwise, no evidence of pneumothorax. IMPRESSION: 1. Probable skin fold overlying the peripheral aspect of the right mid lung. Consider repeat  study after patient repositioning to exclude the possibility of a small pneumothorax. 2. Chronic scattered areas of bronchiectasis and mucous plugging. No new focal airspace consolidation. 3. Previously seen left mid lung nodule appears less conspicuous compared to the previous study. Electronically Signed   By: Davina Poke D.O.   On: 10/23/2020 17:59         Subjective: Pt reports feeling better.  Still a little dysuria but improved.  Pt discusses following up soon with her urologist.  No acute complaints including fever/chills, N/V, CP or SOB.   Discharge Exam: Vitals:   10/26/20 0504 10/26/20 0618  BP: (!) 180/77 (!) 158/73  Pulse: 73 69  Resp: 18 18  Temp: 98.7 F (37.1 C) 98.4 F (36.9 C)  SpO2: 96% 97%   Vitals:   10/26/20 0030 10/26/20 0141 10/26/20 0504 10/26/20 0618  BP: (!) 174/72 (!) 147/71 (!) 180/77 (!) 158/73  Pulse: 86 69 73 69  Resp:   18 18  Temp:   98.7 F (37.1 C) 98.4 F (36.9 C)  TempSrc:   Oral Oral  SpO2:   96% 97%    General: Pt is alert, awake, not in acute distress Cardiovascular: RRR, S1/S2 +, no rubs, no gallops Respiratory: CTA bilaterally, no  wheezing, no rhonchi Abdominal: Soft, NT, ND, bowel sounds + Extremities: no edema, no cyanosis    The results of significant diagnostics from this hospitalization (including imaging, microbiology, ancillary and laboratory) are listed below for reference.     Microbiology: Recent Results (from the past 240 hour(s))  Urine culture     Status: Abnormal   Collection Time: 10/23/20  5:02 PM   Specimen: In/Out Cath Urine  Result Value Ref Range Status   Specimen Description   Final    IN/OUT CATH URINE Performed at [facility], [place] [address] [place], [place] [address]    Special Requests   Final    NONE Performed at [facility], [place] [address], [place], [place] [address]    Culture (A)  Final    50,000 COLONIES/mL DIPHTHEROIDS(CORYNEBACTERIUM SPECIES) Standardized susceptibility testing for this organism is not available. Performed at [facility], [place] [address] [place], [place] [address]    Report Status 10/25/2020 FINAL  Final  Blood Culture (routine x 2)     Status: None (Preliminary result)   Collection Time: 10/23/20  5:15 PM   Specimen: BLOOD  Result Value Ref Range Status   Specimen Description   Final    BLOOD LEFT FOREARM Performed at [place] [address] [place], [place] [address]    Special Requests   Final    BOTTLES DRAWN AEROBIC AND ANAEROBIC Blood Culture adequate volume Performed at [place] [address] [place], [place] [address]    Culture   Final    NO GROWTH 3 DAYS Performed at [facility], [place] [address], [place],  [address]    Report Status PENDING  Incomplete  Resp Panel by RT-PCR (Flu A&B, Covid) Nasopharyngeal Swab     Status: None   Collection Time: 10/23/20  5:15 PM   Specimen: Nasopharyngeal Swab; Nasopharyngeal(NP) swabs in vial transport medium  Result Value Ref Range Status   SARS Coronavirus 2 by RT PCR NEGATIVE NEGATIVE  Final    Comment: (NOTE) SARS-CoV-2 target nucleic acids are NOT DETECTED.  The SARS-CoV-2 RNA is generally detectable in upper respiratory specimens during the acute phase of infection. The  lowest concentration of SARS-CoV-2 viral copies this assay can detect is 138 copies/mL. A negative result does not preclude SARS-Cov-2 infection and should not be used as the sole basis for treatment or other patient management decisions. A negative result may occur with  improper specimen collection/handling, submission of specimen other than nasopharyngeal swab, presence of viral mutation(s) within the areas targeted by this assay, and inadequate number of viral copies(<138 copies/mL). A negative result must be combined with clinical observations, patient history, and epidemiological information. The expected result is Negative.  Fact Sheet for Patients:  EntrepreneurPulse.com.au  Fact Sheet for Healthcare Providers:  IncredibleEmployment.be  This test is no t yet approved or cleared by the [place] FDA and  has been authorized for detection and/or diagnosis of SARS-CoV-2 by FDA under an Emergency Use Authorization (EUA). This EUA will remain  in effect (meaning this test can be used) for the duration of the COVID-19 declaration under Section 564(b)(1) of the Act, 21 U.S.C.section 360bbb-3(b)(1), unless the authorization is terminated  or revoked sooner.       Influenza A by PCR NEGATIVE NEGATIVE Final   Influenza B by PCR NEGATIVE NEGATIVE Final    Comment: (NOTE) The Xpert Xpress SARS-CoV-2/FLU/RSV plus assay is intended as an aid in the diagnosis of influenza from Nasopharyngeal swab specimens and should not be used as a sole basis for treatment. Nasal washings and aspirates are unacceptable for Xpert Xpress SARS-CoV-2/FLU/RSV testing.  Fact Sheet for Patients: EntrepreneurPulse.com.au  Fact Sheet for Healthcare  Providers: IncredibleEmployment.be  This test is not yet approved or cleared by the [place] FDA and has been authorized for detection and/or diagnosis of SARS-CoV-2 by FDA under an Emergency Use Authorization (EUA). This EUA will remain in effect (meaning this test can be used) for the duration of the COVID-19 declaration under Section 564(b)(1) of the Act, 21 U.S.C. section 360bbb-3(b)(1), unless the authorization is terminated or revoked.  Performed at [facility], [place] [address] [place], [place] [address]      Labs: BNP (last 3 results) No results for input(s): BNP in the last 8760 hours. Basic Metabolic Panel: Recent Labs  Lab 10/23/20 1715 10/24/20 0530 10/25/20 0510  NA 135 135 135  K 3.5 3.7 4.0  CL 106 106 105  CO2 22 24 25   GLUCOSE 110* 107* 96  BUN 14 12 13   CREATININE 0.70 0.58 0.66  CALCIUM 8.2* 8.5* 8.4*  MG  --  1.9  --    Liver Function Tests: Recent Labs  Lab 10/23/20 1715 10/24/20 0530  AST 17 15  ALT 14 13  ALKPHOS 44 42  BILITOT 0.5 0.6  PROT 5.9* 6.0*  ALBUMIN 3.0* 2.9*   No results for input(s): LIPASE, AMYLASE in the last 168 hours. No results for input(s): AMMONIA in the last 168 hours. CBC: Recent Labs  Lab 10/23/20 1715 10/24/20 0530  WBC 12.8* 9.5  NEUTROABS 11.8* 8.2*  HGB 13.4 13.2  HCT 39.8 39.3  MCV 93.4 93.8  PLT 261 265   Cardiac Enzymes: No results for input(s): CKTOTAL, CKMB, CKMBINDEX, TROPONINI in the last 168 hours. BNP: Invalid input(s): POCBNP CBG: Recent Labs  Lab 10/25/20 0613  GLUCAP 82   D-Dimer No results for input(s): DDIMER in the last 72 hours. Hgb A1c No results for input(s): HGBA1C in the last 72 hours. Lipid Profile No results for input(s): CHOL, HDL, LDLCALC, TRIG, CHOLHDL, LDLDIRECT in the last 72 hours. Thyroid function studies No results for input(s): TSH, T4TOTAL,  T3FREE, THYROIDAB in the last 72 hours.  Invalid input(s): FREET3 Anemia  work up No results for input(s): VITAMINB12, FOLATE, FERRITIN, TIBC, IRON, RETICCTPCT in the last 72 hours. Urinalysis    Component Value Date/Time   COLORURINE YELLOW 10/23/2020 1702   APPEARANCEUR CLEAR 10/23/2020 1702   LABSPEC 1.006 10/23/2020 1702   PHURINE 7.0 10/23/2020 1702   GLUCOSEU NEGATIVE 10/23/2020 1702   HGBUR NEGATIVE 10/23/2020 1702   BILIRUBINUR NEGATIVE 10/23/2020 1702   KETONESUR 20 (A) 10/23/2020 1702   PROTEINUR NEGATIVE 10/23/2020 1702   UROBILINOGEN 0.2 01/09/2011 0159   NITRITE NEGATIVE 10/23/2020 1702   LEUKOCYTESUR SMALL (A) 10/23/2020 1702   Sepsis Labs Invalid input(s): PROCALCITONIN,  WBC,  LACTICIDVEN Microbiology Recent Results (from the past 240 hour(s))  Urine culture     Status: Abnormal   Collection Time: 10/23/20  5:02 PM   Specimen: In/Out Cath Urine  Result Value Ref Range Status   Specimen Description   Final    IN/OUT CATH URINE Performed at [facility], [place] [address] [place], [place] [address]    Special Requests   Final    NONE Performed at [facility], [place] [address], [place], [place] [address]    Culture (A)  Final    50,000 COLONIES/mL DIPHTHEROIDS(CORYNEBACTERIUM SPECIES) Standardized susceptibility testing for this organism is not available. Performed at [facility], [place] [address] [place], [place] [address]    Report Status 10/25/2020 FINAL  Final  Blood Culture (routine x 2)     Status: None (Preliminary result)   Collection Time: 10/23/20  5:15 PM   Specimen: BLOOD  Result Value Ref Range Status   Specimen Description   Final    BLOOD LEFT FOREARM Performed at [place] [address], [place], [place] [address]    Special Requests   Final    BOTTLES DRAWN AEROBIC AND ANAEROBIC Blood Culture adequate volume Performed at [place] [address], [place], [place] [address]    Culture   Final    NO GROWTH 3  DAYS Performed at [facility], [place] [address] [place], [place] [address]    Report Status PENDING  Incomplete  Resp Panel by RT-PCR (Flu A&B, Covid) Nasopharyngeal Swab     Status: None   Collection Time: 10/23/20  5:15 PM   Specimen: Nasopharyngeal Swab; Nasopharyngeal(NP) swabs in vial transport medium  Result Value Ref Range Status   SARS Coronavirus 2 by RT PCR NEGATIVE NEGATIVE Final    Comment: (NOTE) SARS-CoV-2 target nucleic acids are NOT DETECTED.  The SARS-CoV-2 RNA is generally detectable in upper respiratory specimens during the acute phase of infection. The lowest concentration of SARS-CoV-2 viral copies this assay can detect is 138 copies/mL. A negative result does not preclude SARS-Cov-2 infection and should not be used as the sole basis for treatment or other patient management decisions. A negative result may occur with  improper specimen collection/handling, submission of specimen other than nasopharyngeal swab, presence of viral mutation(s) within the areas targeted by this assay, and inadequate number of viral copies(<138 copies/mL). A negative result must be combined with clinical observations, patient history, and epidemiological information. The expected result is Negative.  Fact Sheet for Patients:  EntrepreneurPulse.com.au  Fact Sheet for Healthcare Providers:  IncredibleEmployment.be  This test is no t yet approved or cleared by the [place] FDA and  has been authorized for detection and/or diagnosis of SARS-CoV-2 by FDA under an  Emergency Use Authorization (EUA). This EUA will remain  in effect (meaning this test can be used) for the duration of the COVID-19 declaration under Section 564(b)(1) of the Act, 21 U.S.C.section 360bbb-3(b)(1), unless the authorization is terminated  or revoked sooner.       Influenza A by PCR NEGATIVE NEGATIVE Final   Influenza B by PCR NEGATIVE NEGATIVE Final    Comment:  (NOTE) The Xpert Xpress SARS-CoV-2/FLU/RSV plus assay is intended as an aid in the diagnosis of influenza from Nasopharyngeal swab specimens and should not be used as a sole basis for treatment. Nasal washings and aspirates are unacceptable for Xpert Xpress SARS-CoV-2/FLU/RSV testing.  Fact Sheet for Patients: EntrepreneurPulse.com.au  Fact Sheet for Healthcare Providers: IncredibleEmployment.be  This test is not yet approved or cleared by the [place] FDA and has been authorized for detection and/or diagnosis of SARS-CoV-2 by FDA under an Emergency Use Authorization (EUA). This EUA will remain in effect (meaning this test can be used) for the duration of the COVID-19 declaration under Section 564(b)(1) of the Act, 21 U.S.C. section 360bbb-3(b)(1), unless the authorization is terminated or revoked.  Performed at [facility], [place] [address], [place], [place] [address]      Time coordinating discharge: Over 30 minutes  SIGNED:   Ezekiel Slocumb, DO [facility] 10/26/2020, 10:59 AM   If 7PM-7AM, please contact night-coverage [email or web address]

## 2020-10-28 ENCOUNTER — Telehealth: Payer: Self-pay | Admitting: Cardiology

## 2020-10-28 NOTE — Telephone Encounter (Signed)
Spoke with patient in regards to IV from hospital visit.  She reports that area where IV was is red, itchy and swollen about an inch wide.  I informed her that she would need to report this information to her PCP.  She expressed that she would rather not go to PCP.  I then told her she can have site checked out at urgent care if it does not improve.  She expressed that she might try urgent care if it doesn't improve in a few days.

## 2020-10-28 NOTE — Telephone Encounter (Signed)
New Message:    Pt was hospitalized on 10-23-20. She said where they had the IVS, her veins are hurting, itching and swollen. She wants to know if this problem will take care of itself or what can she do for it?  She says she have been on Doxycyline Hyclate 100 mg.

## 2020-10-30 LAB — CULTURE, BLOOD (ROUTINE X 2)
Special Requests: ADEQUATE
Special Requests: ADEQUATE

## 2020-11-02 ENCOUNTER — Ambulatory Visit (INDEPENDENT_AMBULATORY_CARE_PROVIDER_SITE_OTHER): Payer: Medicare Other

## 2020-11-02 ENCOUNTER — Ambulatory Visit: Payer: Medicare Other | Admitting: Dermatology

## 2020-11-02 DIAGNOSIS — R55 Syncope and collapse: Secondary | ICD-10-CM | POA: Diagnosis not present

## 2020-11-02 LAB — CUP PACEART REMOTE DEVICE CHECK
Date Time Interrogation Session: 20220531073227
Implantable Pulse Generator Implant Date: 20210824

## 2020-11-09 ENCOUNTER — Other Ambulatory Visit: Payer: Self-pay

## 2020-11-09 ENCOUNTER — Encounter (HOSPITAL_BASED_OUTPATIENT_CLINIC_OR_DEPARTMENT_OTHER): Payer: Self-pay | Admitting: Emergency Medicine

## 2020-11-09 ENCOUNTER — Emergency Department (HOSPITAL_BASED_OUTPATIENT_CLINIC_OR_DEPARTMENT_OTHER): Payer: Medicare Other

## 2020-11-09 ENCOUNTER — Emergency Department (HOSPITAL_BASED_OUTPATIENT_CLINIC_OR_DEPARTMENT_OTHER)
Admission: EM | Admit: 2020-11-09 | Discharge: 2020-11-09 | Disposition: A | Discharge status: Home / Self Care | Payer: Medicare Other | Attending: Emergency Medicine | Admitting: Emergency Medicine

## 2020-11-09 ENCOUNTER — Telehealth: Payer: Self-pay | Admitting: *Deleted

## 2020-11-09 DIAGNOSIS — R002 Palpitations: Secondary | ICD-10-CM

## 2020-11-09 DIAGNOSIS — R0789 Other chest pain: Secondary | ICD-10-CM | POA: Diagnosis not present

## 2020-11-09 DIAGNOSIS — R079 Chest pain, unspecified: Secondary | ICD-10-CM | POA: Diagnosis present

## 2020-11-09 DIAGNOSIS — Z85828 Personal history of other malignant neoplasm of skin: Secondary | ICD-10-CM | POA: Diagnosis not present

## 2020-11-09 DIAGNOSIS — D72829 Elevated white blood cell count, unspecified: Secondary | ICD-10-CM | POA: Insufficient documentation

## 2020-11-09 DIAGNOSIS — Z7901 Long term (current) use of anticoagulants: Secondary | ICD-10-CM | POA: Insufficient documentation

## 2020-11-09 DIAGNOSIS — R072 Precordial pain: Secondary | ICD-10-CM | POA: Diagnosis not present

## 2020-11-09 DIAGNOSIS — Z8679 Personal history of other diseases of the circulatory system: Secondary | ICD-10-CM

## 2020-11-09 DIAGNOSIS — I10 Essential (primary) hypertension: Secondary | ICD-10-CM | POA: Diagnosis not present

## 2020-11-09 LAB — BASIC METABOLIC PANEL
Anion gap: 9 (ref 5–15)
BUN: 15 mg/dL (ref 8–23)
CO2: 24 mmol/L (ref 22–32)
Calcium: 9 mg/dL (ref 8.9–10.3)
Chloride: 102 mmol/L (ref 98–111)
Creatinine, Ser: 0.72 mg/dL (ref 0.44–1.00)
GFR, Estimated: >60 mL/min (ref >60)
Glucose, Bld: 97 mg/dL (ref 70–99)
Potassium: 4.1 mmol/L (ref 3.5–5.1)
Sodium: 135 mmol/L (ref 135–145)

## 2020-11-09 LAB — CBC
HCT: 42.1 % (ref 36.0–46.0)
Hemoglobin: 14.3 g/dL (ref 12.0–15.0)
MCH: 31.4 pg (ref 26.0–34.0)
MCHC: 34 g/dL (ref 30.0–36.0)
MCV: 92.5 fL (ref 80.0–100.0)
Platelets: 352 10*3/uL (ref 150–400)
RBC: 4.55 MIL/uL (ref 3.87–5.11)
RDW: 13.6 % (ref 11.5–15.5)
WBC: 12.6 10*3/uL — ABNORMAL HIGH (ref 4.0–10.5)
nRBC: 0 % (ref 0.0–0.2)

## 2020-11-09 LAB — TROPONIN I (HIGH SENSITIVITY)
Troponin I (High Sensitivity): 9 ng/L (ref <18)
Troponin I (High Sensitivity): 13 ng/L (ref <18)

## 2020-11-09 NOTE — ED Triage Notes (Signed)
pressure from waist to neck started 630 am today. Hx A fib , shortness of breath worse this Am. Hx chronic bronchitis.

## 2020-11-09 NOTE — Telephone Encounter (Addendum)
Patient transmission from [facility] reviewed and showed episodes of SVT this morning. Patient contacted and reports she had extreme weakness and could feel her heart racing shortly after 0630 AM. She reports she had no CP, or syncope but did experience palpitations and dizziness with chest pressure. Patient reports palpitations stopped about  1 hour after taking ditiazem 30 mg prn dose. Patient reports she still feels weak, has SOB with activity, and has constant substernal chest pressure. Patient reports cough producing clear secretions. Advised patient to go to ED for evaluation for cardiac symptoms.

## 2020-11-09 NOTE — ED Provider Notes (Addendum)
[facility] EMERGENCY DEPT Provider Note   CSN: [id number] Arrival date & time: 11/09/20  1244     History Chief Complaint  Patient presents with  . Chest Pain    [name] is a 87 y.o. female.   Patient followed by Dr. Curt Bears cardiology.  No known coronary disease other than a history of atrial fibrillation.  And syncope.  Patient last seen by them on April 11.  Patient is on Eliquis.  But states she just takes her diltiazem as needed when she feels palpitations.  Cardiology note implies that perhaps she did not tolerate diltiazem on a regular basis but not clear whether she is supposed to be taking it more frequently.  At about 630 this morning she had a pressure sensation anterior chest had rapid heart rate.  Patient's son states that sometimes she gets anxious and that brings on the pain when she has the palpitations.  The pain did radiate towards the left neck area.  Still has some discomfort but markedly improved from where it was at home.  She called cardiology.  She does have a loop recorder.  They reviewed that she did have some runs of atrial fibrillation.  Does not have any palpitations now.  Due to the chest pressure felt like a cat was sitting on her chest they appropriately recommended for her to come in and and be evaluated.  Patient appears comfortable currently.  Patient did take one of her diltiazem medications around 8 this morning.  Has not felt any palpitations starting about an hour after taking that.        Past Medical History:  Diagnosis Date  . Arm numbness left   . Back pain   . Bronchitis, chronic ([place])   . Depression   . Dizziness   . Fatigue   . Hypertension    mild  . Palpitations   . Skin cancer   . Stress   . Syncope    History of     Patient Active Problem List   Diagnosis Date Noted  . Pyelonephritis 10/24/2020  . Acute pyelonephritis 10/23/2020  . AF (paroxysmal atrial fibrillation) ([place]) 10/23/2020  . GERD without  esophagitis 10/23/2020  . Tachycardia 09/02/2018  . Chest pain 08/23/2018  . Acute bronchitis 05/07/2018  . Thyroid nodule 04/06/2016  . Upper airway cough syndrome 11/24/2015  . Near syncope 03/02/2015  . Mastoiditis of both sides 08/10/2014  . Palpitations   . Syncope   . Dizziness   . Hypertension   . Back pain   . Depression   . Stress   . Fatigue   . Arm numbness left   . Skin cancer   . Bronchitis, chronic ([place])   . Obstructive bronchiectasis ([place]) 08/28/2007    Past Surgical History:  Procedure Laterality Date  . APPENDECTOMY    . CESAREAN SECTION  [phone]     OB History   No obstetric history on file.     Family History  Problem Relation Age of Onset  . Atrial fibrillation Mother   . Thyroid disease Mother   . Alcohol abuse Father     Social History   Tobacco Use  . Smoking status: Never Smoker  . Smokeless tobacco: Never Used  Vaping Use  . Vaping Use: Never used  Substance Use Topics  . Alcohol use: Yes    Comment: social ETOH  . Drug use: No    Home Medications Prior to Admission medications   Medication Sig Start Date  End Date Taking? Authorizing Provider  acetaminophen (TYLENOL) 500 MG tablet Take 1,000 mg by mouth every 6 (six) hours as needed for fever.    [provider]  Ascorbic Acid (VITAMIN C) 100 MG tablet Take 100 mg by mouth daily.    [provider]  cholecalciferol (VITAMIN D3) 25 MCG (1000 UNIT) tablet Take 1,000 Units by mouth daily.    [provider]  Dextromethorphan-guaiFENesin (MUCINEX DM) 30-600 MG TB12 Take 1 tablet by mouth daily.    [provider]  diltiazem (CARDIZEM) 30 MG tablet Take 1 tablet (30 mg total) by mouth 4 (four) times daily as needed (for elevated heart rates). 07/05/20   Camnitz, Will Hassell Done, MD  ELIQUIS 2.5 MG TABS tablet TAKE 1 TABLET(2.5 MG) BY MOUTH TWICE DAILY Patient taking differently: Take 2.5 mg by mouth 2 (two) times daily. 09/10/20   Sherran Needs,  NP  famotidine (PEPCID) 20 MG tablet One after supper Patient taking differently: Take 20 mg by mouth daily. One after supper 08/02/20   Tanda Rockers, MD  feeding supplement (ENSURE ENLIVE / ENSURE PLUS) LIQD Take 237 mLs by mouth 2 (two) times daily between meals. 10/26/20   Ezekiel Slocumb, DO  folic acid (FOLVITE) 1 MG tablet Take 1 mg by mouth daily.    [provider]  HYDROcodone-acetaminophen (NORCO/VICODIN) 5-325 MG tablet Take 1 tablet by mouth every 4 (four) hours as needed for moderate pain. Patient taking differently: Take 1 tablet by mouth at bedtime. 06/24/18   Tanda Rockers, MD  Multiple Vitamins-Minerals (ONE-A-DAY WOMENS 50 PLUS PO) Take 1 tablet by mouth daily.    [provider]  pantoprazole (PROTONIX) 40 MG tablet Take 1 tablet (40 mg total) by mouth daily. Take 30-60 min before first meal of the day 08/02/20   Tanda Rockers, MD    Allergies    Hydralazine hcl, Ciprofloxacin, Penicillins, Sulfonamide derivatives, Avelox [moxifloxacin], Band-aid infection defense [bacitracin-polymyxin b], Mannitol, Multaq [dronedarone], Nitrofurantoin monohyd macro, Other, Prednisone, Reclast [zoledronic acid], Sulfa antibiotics, and Chlorpheniramine  Review of Systems   Review of Systems  Constitutional: Negative for chills and fever.  HENT: Negative for rhinorrhea and sore throat.   Eyes: Negative for visual disturbance.  Respiratory: Negative for cough and shortness of breath.   Cardiovascular: Positive for chest pain and palpitations. Negative for leg swelling.  Gastrointestinal: Negative for abdominal pain, diarrhea, nausea and vomiting.  Genitourinary: Negative for dysuria.  Musculoskeletal: Negative for back pain and neck pain.  Skin: Negative for rash.  Neurological: Negative for dizziness, light-headedness and headaches.  Hematological: Does not bruise/bleed easily.  Psychiatric/Behavioral: Negative for confusion.    Physical Exam Updated Vital  Signs BP 134/74 (BP Location: Left Arm)   Pulse 76   Temp 98.3 F (36.8 C) (Oral)   Resp (!) 6   Ht 1.626 m (_0 )   Wt 54 kg   SpO2 96%   BMI 20.43 kg/m   Physical Exam Vitals and nursing note reviewed.  Constitutional:      General: She is not in acute distress.    Appearance: Normal appearance. She is well-developed.  HENT:     Head: Normocephalic and atraumatic.  Eyes:     Conjunctiva/sclera: Conjunctivae normal.     Pupils: Pupils are equal, round, and reactive to light.  Cardiovascular:     Rate and Rhythm: Normal rate and regular rhythm.     Heart sounds: No murmur heard.   Pulmonary:  Effort: Pulmonary effort is normal. No respiratory distress.     Breath sounds: Normal breath sounds. No wheezing or rales.  Chest:     Chest wall: No tenderness.  Abdominal:     Palpations: Abdomen is soft.     Tenderness: There is no abdominal tenderness.  Musculoskeletal:        General: No swelling.     Cervical back: Normal range of motion and neck supple.  Skin:    General: Skin is warm and dry.  Neurological:     General: No focal deficit present.     Mental Status: She is alert and oriented to person, place, and time.     Cranial Nerves: No cranial nerve deficit.     Sensory: No sensory deficit.     Motor: No weakness.     ED Results / Procedures / Treatments   Labs (all labs ordered are listed, but only abnormal results are displayed) Labs Reviewed  CBC - Abnormal; Notable for the following components:      Result Value   WBC 12.6 (*)    All other components within normal limits  BASIC METABOLIC PANEL  TROPONIN I (HIGH SENSITIVITY)  TROPONIN I (HIGH SENSITIVITY)    EKG EKG Interpretation  Date/Time:  Tuesday November 09 2020 12:54:02 EDT Ventricular Rate:  88 PR Interval:  170 QRS Duration: 89 QT Interval:  346 QTC Calculation: 419 R Axis:   91 Text Interpretation: Sinus rhythm Consider right ventricular hypertrophy ST elevation, consider inferior  injury Otherwise no significant change other than the ST elevation Confirmed by Fredia Sorrow [phone]) on 11/09/2020 2:28:03 PM   Radiology DG Chest Portable 1 View  Result Date: 11/09/2020 CLINICAL DATA:  Intermittent chest pressure, worse today. EXAM: PORTABLE CHEST 1 VIEW COMPARISON:  10/03/2020.  Chest CT dated 04/04/2016 FINDINGS: Normal sized heart. Tortuous aorta. Multiple small nodular opacities in both lungs, greater on the right, with mild progression. Diffuse osteopenia. IMPRESSION: 1. 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 2. No acute abnormality. Electronically Signed   By: Claudie Revering M.D.   On: 11/09/2020 13:55    Procedures Procedures   Medications Ordered in ED Medications - No data to display  ED Course  I have reviewed the triage vital signs and the nursing notes.  Pertinent labs & imaging results that were available during my care of the patient were reviewed by me and considered in my medical decision making (see chart for details).    MDM Rules/Calculators/A&P                           EKG without any significant changes other than some subtle inferior ST elevation.  Chest x-ray without acute findings.  Labs without any significant abnormality initial troponin was 9 which was reassuring.  CBC with a mild leukocytosis with a white count of 12,000 but hemoglobin was normal.  Patient feels much better now cardiac monitoring shows sinus rhythm.  No arrhythmia no evidence of any atrial fibrillation currently.  Patient did take a dose of diltiazem this morning when she had the pain around 8.  That may have improved things.  But since patient still has some persistent discomfort anterior chest delta troponin is important.  Initial troponin was 9.  Repeat troponin 13 no significant change.  Patient without worse chest pain.  Will discharge home with follow-up with  cardiology  Final Clinical Impression(s) / ED Diagnoses Final diagnoses:  Precordial pain  Palpitations  History of atrial fibrillation    Rx / DC Orders ED Discharge Orders    None       Fredia Sorrow, MD 11/09/20 1628    Fredia Sorrow, MD 11/09/20 [phone]

## 2020-11-09 NOTE — ED Notes (Signed)
Onset this am 0630 of pain from mid abdominal to neck.  Pain across chest.  States has SOB.

## 2020-11-09 NOTE — Telephone Encounter (Signed)
Pt reports rough morning today, since about 6:30 am. States she sent in a transmission to see if AFib. Reports really fast heart beat, chest hurting, coughing, really weak and SOB this morning.  She took her Eliquis this morning along w/ Diltiazem 30 mg PRN -- things have calmed down now.  She also wants [place] to know she did have UTI & ecoli infection near end of last month, still recovering some from that.  Aware will forward to our device clinic to see what transmission shows. Aware I will call once we know what transmission shows. Patient verbalized understanding and agreeable to plan.

## 2020-11-09 NOTE — Discharge Instructions (Addendum)
Return for any new or worse chest pain.  Follow-up with cardiology.  Asked them about may be taking the diltiazem on a regular basis.

## 2020-11-10 ENCOUNTER — Telehealth: Payer: Self-pay

## 2020-11-10 NOTE — Telephone Encounter (Signed)
[facility] alert received today for AF w/RVR detected 11/09/20 at 7:00 am. Episode appears to begin at 5:53 am with initial V rates controlled. Total time in AF 5 hours, 58 min.   EMR reviewed. Patient presented to [facility] emergency department 11/09/20 at 1244, after event had terminated secondary to having chest discomfort during episode.. Documentation reviewed and states patient took PRN Cardizem at approx 7am with resolution of palpitations an hour after Cardizem, however chest discomfort remained but was improving on presentation to ED. Troponin x 2 negative. Patient prescribed Eliquis 2.5 mg BID.  Unsuccessful telephone encounter to patient this am to follow up on symptoms. Appears to be back in sinus per today's transmission. [employer] compliant VM message left requesting call back to [phone].

## 2020-11-10 NOTE — Telephone Encounter (Signed)
I agree with referral to the ER

## 2020-11-10 NOTE — Telephone Encounter (Signed)
Patient returning call. States she is feeling well today from a cardiovascular standpoint however recovering from a UTI. Reinforced importance of taking PRN Cardizem as soon as she feels the first few palpitation and reinforced importance of anxiety reducing strategies. Patient is encouraged to keep Cardizem at her bedside as she states she feels most palpitations when she is in bed, but does not want to get out of bed to get medication. Currently she keeps medication in her purse. Patient verbalized understanding of importance of early treatment of palpitations and will now keep Cardizem at bedside. She is encouraged to call device clinic with any additional questions or concerns at [phone].

## 2020-11-12 DIAGNOSIS — N390 Urinary tract infection, site not specified: Secondary | ICD-10-CM | POA: Diagnosis not present

## 2020-11-15 DIAGNOSIS — I10 Essential (primary) hypertension: Secondary | ICD-10-CM | POA: Diagnosis not present

## 2020-11-15 DIAGNOSIS — N390 Urinary tract infection, site not specified: Secondary | ICD-10-CM | POA: Diagnosis not present

## 2020-11-15 DIAGNOSIS — I4811 Longstanding persistent atrial fibrillation: Secondary | ICD-10-CM | POA: Diagnosis not present

## 2020-11-18 ENCOUNTER — Encounter: Payer: Self-pay | Admitting: Dermatology

## 2020-11-18 ENCOUNTER — Other Ambulatory Visit: Payer: Self-pay

## 2020-11-18 ENCOUNTER — Ambulatory Visit (INDEPENDENT_AMBULATORY_CARE_PROVIDER_SITE_OTHER): Payer: Medicare Other | Admitting: Dermatology

## 2020-11-18 DIAGNOSIS — Z1283 Encounter for screening for malignant neoplasm of skin: Secondary | ICD-10-CM

## 2020-11-18 DIAGNOSIS — Z8589 Personal history of malignant neoplasm of other organs and systems: Secondary | ICD-10-CM

## 2020-11-18 DIAGNOSIS — Z85828 Personal history of other malignant neoplasm of skin: Secondary | ICD-10-CM | POA: Diagnosis not present

## 2020-11-18 DIAGNOSIS — L570 Actinic keratosis: Secondary | ICD-10-CM | POA: Diagnosis not present

## 2020-11-18 DIAGNOSIS — D2239 Melanocytic nevi of other parts of face: Secondary | ICD-10-CM

## 2020-11-18 DIAGNOSIS — L578 Other skin changes due to chronic exposure to nonionizing radiation: Secondary | ICD-10-CM

## 2020-11-18 DIAGNOSIS — L821 Other seborrheic keratosis: Secondary | ICD-10-CM | POA: Diagnosis not present

## 2020-11-18 DIAGNOSIS — D485 Neoplasm of uncertain behavior of skin: Secondary | ICD-10-CM

## 2020-11-18 NOTE — Patient Instructions (Signed)

## 2020-11-24 NOTE — Progress Notes (Signed)
Carelink Summary Report / Loop Recorder 

## 2020-11-25 ENCOUNTER — Ambulatory Visit (INDEPENDENT_AMBULATORY_CARE_PROVIDER_SITE_OTHER): Payer: Medicare Other

## 2020-11-25 DIAGNOSIS — R55 Syncope and collapse: Secondary | ICD-10-CM

## 2020-11-25 LAB — CUP PACEART REMOTE DEVICE CHECK
Date Time Interrogation Session: 20220623071354
Implantable Pulse Generator Implant Date: 20210824

## 2020-11-27 ENCOUNTER — Encounter: Payer: Self-pay | Admitting: Dermatology

## 2020-11-27 NOTE — Progress Notes (Signed)
   Follow-Up Visit   Subjective  [name] is a 87 y.o. female who presents for the following: Annual Exam (Patient here today for yearly skin check. Per patient her nose continues to stay crusty x 1 year, no bleeding. Per patient she also has a recurrent dry spot on her left cheek , recheck right inner thigh that was treated years ago. Check lesion on left post thigh x 1 year just itching. Personal history of non mole skin cancer. No personal history of atypical moles or melanoma. No family history of atypical moles, melanoma or non mole skin cancer. ).  General skin check, history of skin cancers, possible new spots on right leg Location:  Duration:  Quality:  Associated Signs/Symptoms: Modifying Factors:  Severity:  Timing: Context:   Objective  Well appearing patient in no apparent distress; mood and affect are within normal limits. Left Buccal Cheek, Mid Back Textured 5 mm light brown flattopped papules  Left Ala Nasi Crusted 3 mm pearly papule, rule out BCC     Right Lower Leg - Posterior, Right Thigh - Anterior Pink crusts, 2 cm on right calf but patient states historically stable, never bleeds, asymptomatic.       Torso - Posterior (Back) General skin examination, no atypical pigmented lesions.    A full examination was performed including scalp, head, eyes, ears, nose, lips, neck, chest, axillae, abdomen, back, buttocks, bilateral upper extremities, bilateral lower extremities, hands, feet, fingers, toes, fingernails, and toenails. All findings within normal limits unless otherwise noted below.  Areas beneath undergarments not fully examined.   Assessment & Plan    Seborrheic keratosis (2) Mid Back; Left Buccal Cheek  Leave if stable  Neoplasm of uncertain behavior of skin Left Ala Nasi  Skin / nail biopsy Type of biopsy: tangential   Informed consent: discussed and consent obtained   Timeout: patient name, date of birth, surgical site, and  procedure verified   Anesthesia: the lesion was anesthetized in a standard fashion   Anesthetic:  1% lidocaine w/ epinephrine 1-100,000 local infiltration Instrument used: flexible razor blade   Hemostasis achieved with: aluminum chloride and electrodesiccation   Outcome: patient tolerated procedure well   Post-procedure details: wound care instructions given    Specimen 1 - Surgical pathology Differential Diagnosis: bcc scc  Check Margins: No  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  Recheck 6 months  Encounter for screening for malignant neoplasm of skin Torso - Posterior (Back)  Annual skin examination      I, Lavonna Monarch, MD, have reviewed all documentation for this visit.  The documentation on 11/27/20 for the exam, diagnosis, procedures, and orders are all accurate and complete.

## 2020-11-29 ENCOUNTER — Telehealth: Payer: Self-pay | Admitting: Dermatology

## 2020-11-29 NOTE — Telephone Encounter (Signed)
Path to patient no action neccessary

## 2020-11-29 NOTE — Telephone Encounter (Signed)
Results, ST 

## 2020-12-09 ENCOUNTER — Telehealth: Payer: Self-pay | Admitting: Cardiology

## 2020-12-09 ENCOUNTER — Other Ambulatory Visit: Payer: Self-pay | Admitting: Cardiology

## 2020-12-09 MED ORDER — APIXABAN 2.5 MG PO TABS: 2.5000 mg | ORAL_TABLET | Freq: Two times a day (BID) | ORAL | 6 refills | Status: DC

## 2020-12-09 NOTE — Telephone Encounter (Signed)
*  STAT* If patient is at the pharmacy, call can be transferred to refill team.   1. Which medications need to be refilled? (please list name of each medication and dose if known) *new prescription for Eliquis- 2.5 mg changing pharmacy  2. Which pharmacy/location (including street and city if local pharmacy) is medication to be sent to? [facility] [address] [place]  3. Do they need a 30 day or 90 day supply? 30 days

## 2020-12-09 NOTE — Telephone Encounter (Signed)
Left message to call back  

## 2020-12-09 NOTE — Telephone Encounter (Signed)
Pt c/o medication issue:  1. Name of Medication: Eliquis   2. How are you currently taking this medication (dosage and times per day)? 2 times a day  3. Are you having a reaction (difficulty breathing--STAT)? no  4. What is your medication issue? bruising  a lot

## 2020-12-09 NOTE — Telephone Encounter (Signed)
Prescription refill request for Eliquis received. Indication: PAF Last office visit: 09/13/20  Camnitz MD Scr: 0.72 on 11/09/20 Age:  87 Weight: 54.7kg  Based on above findings Eliquis 2.5mg  twice daily is the appropriate dose.  Refill approved.

## 2020-12-15 NOTE — Progress Notes (Signed)
Carelink Summary Report / Loop Recorder 

## 2020-12-28 ENCOUNTER — Ambulatory Visit (INDEPENDENT_AMBULATORY_CARE_PROVIDER_SITE_OTHER): Payer: Medicare Other

## 2020-12-28 DIAGNOSIS — R300 Dysuria: Secondary | ICD-10-CM | POA: Diagnosis not present

## 2020-12-28 DIAGNOSIS — R55 Syncope and collapse: Secondary | ICD-10-CM

## 2020-12-28 DIAGNOSIS — N281 Cyst of kidney, acquired: Secondary | ICD-10-CM | POA: Diagnosis not present

## 2020-12-29 LAB — CUP PACEART REMOTE DEVICE CHECK
Date Time Interrogation Session: 20220727070639
Implantable Pulse Generator Implant Date: 20210824

## 2021-01-04 DIAGNOSIS — H04123 Dry eye syndrome of bilateral lacrimal glands: Secondary | ICD-10-CM | POA: Diagnosis not present

## 2021-01-04 DIAGNOSIS — Z961 Presence of intraocular lens: Secondary | ICD-10-CM | POA: Diagnosis not present

## 2021-01-04 DIAGNOSIS — H26493 Other secondary cataract, bilateral: Secondary | ICD-10-CM | POA: Diagnosis not present

## 2021-01-11 ENCOUNTER — Ambulatory Visit: Payer: Medicare Other | Admitting: Internal Medicine

## 2021-01-11 ENCOUNTER — Other Ambulatory Visit: Payer: Self-pay | Admitting: *Deleted

## 2021-01-11 MED ORDER — DILTIAZEM HCL 30 MG PO TABS: 30.0000 mg | ORAL_TABLET | Freq: Four times a day (QID) | ORAL | 1 refills | Status: DC | PRN

## 2021-01-14 DIAGNOSIS — Z1231 Encounter for screening mammogram for malignant neoplasm of breast: Secondary | ICD-10-CM | POA: Diagnosis not present

## 2021-01-17 ENCOUNTER — Ambulatory Visit (INDEPENDENT_AMBULATORY_CARE_PROVIDER_SITE_OTHER): Payer: Medicare Other | Admitting: Internal Medicine

## 2021-01-17 ENCOUNTER — Encounter: Payer: Self-pay | Admitting: Internal Medicine

## 2021-01-17 ENCOUNTER — Other Ambulatory Visit: Payer: Self-pay

## 2021-01-17 VITALS — BP 128/74 | HR 88 | Ht 64.0 in | Wt 121.0 lb

## 2021-01-17 DIAGNOSIS — J479 Bronchiectasis, uncomplicated: Secondary | ICD-10-CM | POA: Diagnosis not present

## 2021-01-17 NOTE — Patient Instructions (Signed)
We need to check morning for afb and routine stain and culure   Please schedule a follow up visit in 6  months but call sooner if needed

## 2021-01-17 NOTE — Assessment & Plan Note (Signed)
Onset ? Age 87 wit bronchiectasis first confirmed 2007  - Alpha one Screen  August 18, 2010 = MM  - See CT Chest 09/29/05 Stable bronchiectasis in right middle lobe and lingula.  Scattered tree in bud opacities throughout the lungs, right greater than left,  nonspecific.   - PFT's 06/21/2012 1.67 (91%) ratio 64 and no change,  DLCO 77% - Flutter valve added 04/27/2014  - PFTs 05/24/15   FEV1 1.55 (81%) ratio 69 p 6% improvement from saba and dlco 62% ratio 88%  - HRCT 04/04/16 >>> 1. 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 2. An incidental finding of potential clinical significance has been found. Dominant nodule in the right upper lobe, likely infectious/inflammatory in etiology.  - Zmax 250 mg daily 03/01/16 - 05/01/16 improved cough at d/c but diarrhea while on it and no change in nodule RUL > d/c 07/07/16 - 08/28/2017 added back cycles of zpak prn flare of purulent bronchitis > d/c 05/20/2018  - levaquin 500 x 10 days prn 06/11/2018 - flutter valve training 06/24/18 Chest CT 09/09/2018 Increased tree-in-bud nodularity in right lower lobe since 2016 -10/11/2020  new L lung mid nodule noted and likely inflammatory  - 01/17/2021    Am sputum samples>>>   Well compensated s the typical am exac / purulent sputum but worthwhile checking for resistant orgs or atypica tb and continuing rx directed at better cough mechanics/ effectiveness with flutter/mucinex products  F/u q 6 m, call sooner if needed          Each maintenance medication was reviewed in detail including emphasizing most importantly the difference between maintenance and prns and under what circumstances the prns are to be triggered using an action plan format where appropriate.  Total time for H and P, chart review, counseling, reviewing flutter device(s) and generating customized AVS unique  to this office visit / same day charting = 25 min

## 2021-01-17 NOTE — Progress Notes (Signed)
Subjective:    Patient ID: [name], female    DOB: 1933/04/03    MRN: [id number]    Primary Provider/Referring Provider: Dr Doyle Askew   Brief patient profile:  87 yowf never smoker/ MM   with recurrent cough since age 87 with right middle lobe/lingular syndrome with limited bronchiectatic changes on  CT study dated 09/2005    History of Present Illness    03/01/2016  f/u ov/[name] re:  Bronchiectasis with uacs/ some better on gerd rx  Chief Complaint  Patient presents with   Follow-up    Pt states her dry cough is unchanged since last OV. Pt states she has ocassional chest tightness and SOB but attributes this to stress. Pt also c/o hoarseness.    coughs up to an hour each am, completely non-productive even with flutter  rec No change in medications Nasty mucus > doxycline 100 mg twice daily x 10 days   Late add for nodular opacity RUL c/w MAI    zmax x 250 mg daily x 30 days then recheck cxr      02/03/2020  f/u ov/[name] re: bronchiectasis  Chief Complaint  Patient presents with   Follow-up    SOB and fatigue  Dyspnea:  Broke foot  When fell so no regular wallking now Cough: white, worse in am 's Sleeping: bed flat, 2 pillows  SABA use: none  02: none rec thyroid nodule f/u gerkin   08/02/2020  f/u ov/[name] re: bronchiectasis, not following action plan/ no longer on gerd rx  Chief Complaint  Patient presents with   Follow-up    Chest pain and productive cough  Dyspnea:  Steps one way up / heart pounds when go  Cough: changed colors in am x 6 weeks assoc with honking,not using mucinex, never started levaquin positonal Chest pain under the left breast x years but for the last 6 weeks worse with cough Sleeping: flat bed with 2 pillows - worse x 6 weeks due to increaseing cough  SABA use: none  02: none  Covid status:   vax x 2 rec For cough/congestion >>>   mucinex dm is up 1200 mg every 12 hours and use the flutter   For nasty mucus >  levaquin 500 mg one daily x 10  days Pantoprazole (protonix) 40 mg   Take  30-60 min before first meal of the day and Pepcid (famotidine)  20 mg one after Supper  until return to office - this is the best way to tell whether stomach acid is contributing to your problem.   GERD diet  We will be calling you for allergy evaluation >>> not done as of 10/11/2020  cxr >> no change     10/11/2020  f/u ov/[name] re: bronchiectasis / s/p macrodantin for UTI and "not right since"  Chief Complaint  Patient presents with   Follow-up    Increased cough for the past couple of months- prod with white sputum.   Dyspnea:  More weak than sob limiting activty  Cough:white sputum / first thing am  Then worse with talking or taking deep breath Sleeping: bed is flat/ 2 pillows and eventually quiets down  SABA use: none  02: none  Covid status:   vax x 2 pfizer   Rec I recommend you go ahead and see  your allergist next     01/17/2021  f/u ov/[name] re: bronchiectasis / no recent levaquin Chief Complaint  Patient presents with   Follow-up    3 mo  f/u. States she has noticed that she tends to cough more at night and with exertion.   Dyspnea:  more weak than sob also L hip limiting  Cough: white mucus esp hs but better in am  Sleeping: bed blocks and pillows / if lies flat brings up green  mucus but not typically in am SABA use: flutter valve only  02: none  Covid status:   vax  x 2 and never infected   No obvious day to day or daytime variability or assoc excess/ purulent sputum or mucus plugs or hemoptysis or cp or chest tightness, subjective wheeze or overt sinus or hb symptoms.   Sleeping  without nocturnal  or early am exacerbation  of respiratory  c/o's or need for noct saba. Also denies any obvious fluctuation of symptoms with weather or environmental changes or other aggravating or alleviating factors except as outlined above   No unusual exposure hx or h/o childhood pna/ asthma or knowledge of premature birth.  Current Allergies,  Complete Past Medical History, Past Surgical History, Family History, and Social History were reviewed in [employer] record.  ROS  The following are not active complaints unless bolded Hoarseness, sore throat, dysphagia, dental problems, itching, sneezing,  nasal congestion or discharge of excess mucus or purulent secretions, ear ache,   fever, chills, sweats, unintended wt loss or wt gain, classically pleuritic or exertional cp,  orthopnea pnd or arm/hand swelling  or leg swelling, presyncope, palpitations, abdominal pain, anorexia, nausea, vomiting, diarrhea  or change in bowel habits or change in bladder habits, change in stools or change in urine, dysuria, hematuria,  rash, arthralgias, visual complaints, headache, numbness, weakness or ataxia or problems with walking or coordination,  change in mood or  memory.        Current Meds  Medication Sig   acetaminophen (TYLENOL) 500 MG tablet Take 1,000 mg by mouth every 6 (six) hours as needed for fever.   apixaban (ELIQUIS) 2.5 MG TABS tablet Take 1 tablet (2.5 mg total) by mouth 2 (two) times daily. TAKE 1 TABLET(2.5 MG) BY MOUTH TWICE DAILY   Ascorbic Acid (VITAMIN C) 100 MG tablet Take 100 mg by mouth daily.   cholecalciferol (VITAMIN D3) 25 MCG (1000 UNIT) tablet Take 1,000 Units by mouth daily.   Dextromethorphan-guaiFENesin (MUCINEX DM) 30-600 MG TB12 Take 1 tablet by mouth daily.   diltiazem (CARDIZEM) 30 MG tablet Take 1 tablet (30 mg total) by mouth 4 (four) times daily as needed (for elevated heart rates).   famotidine (PEPCID) 20 MG tablet One after supper (Patient taking differently: Take 20 mg by mouth daily. One after supper)   feeding supplement (ENSURE ENLIVE / ENSURE PLUS) LIQD Take 237 mLs by mouth 2 (two) times daily between meals.   folic acid (FOLVITE) 1 MG tablet Take 1 mg by mouth daily.   HYDROcodone-acetaminophen (NORCO/VICODIN) 5-325 MG tablet Take 1 tablet by mouth every 4 (four) hours as needed for  moderate pain. (Patient taking differently: Take 1 tablet by mouth at bedtime.)   Multiple Vitamins-Minerals (ONE-A-DAY WOMENS 50 PLUS PO) Take 1 tablet by mouth daily.   pantoprazole (PROTONIX) 40 MG tablet Take 1 tablet (40 mg total) by mouth daily. Take 30-60 min before first meal of the day                     Past Medical History:  BRONCHIECTASIS (ICD-494.0) see CT scan of the chest dated 09/29/2005  - Pneumovax April 28, 2009 (over 65 so last one)  Prevnar given 04/27/2014   - Alpha one Screen  August 18, 2010 = MM  - Sinus CT August 18, 2010 >>  neg  COUGH, CHRONIC (ICD-786.2)  Left Kidney Cyst  - 04/21/09 [facility] PROCEDURE(S): ULTRASOUND GUIDED AND FLUOROSCOPIC GUIDED LEFT RENAL CYST ASPIRATION AND SCLEROSIS  LUQ Abd pain 2011...............................................................Marland KitchenSchooler  - CT ABD 07/29/10 no etiology        Objective:   Physical Exam   01/17/2021         121  10/11/2020           118 08/02/2020         126   02/03/2020        124  Wt  09/19/2019  120  07/22/2018         122  05/20/2018       129  11/30/2017         131  March 15,2012 137    Vital signs reviewed  01/17/2021  - Note at rest 02 sats  96% on RA   General appearance:    elderly wf nad   HEENT : pt wearing mask not removed for exam due to covid - 19 concerns.   NECK :  without JVD/Nodes/TM/ nl carotid upstrokes bilaterally   LUNGS: no acc muscle use,  Min barrel  contour chest wall with bilateral  slightly decreased bs s audible wheeze and  without cough on insp or exp maneuvers and min  Hyperresonant  to  percussion bilaterally     CV:  RRR  no s3 or murmur or increase in P2, and no edema   ABD:  soft and nontender with pos end  insp Hoover's  in the supine position. No bruits or organomegaly appreciated, bowel sounds nl  MS:   Nl gait/  ext warm without deformities, calf tenderness, cyanosis or clubbing No obvious joint restrictions   SKIN: warm and dry without lesions     NEURO:  alert, approp, nl sensorium with  no motor or cerebellar deficits apparent.         I personally reviewed images and agree with radiology impression as follows:  CXR:   portable 11/09/20 1. 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 2. No acute abnormality           Assessment & Plan:

## 2021-01-21 ENCOUNTER — Telehealth: Payer: Self-pay | Admitting: Cardiology

## 2021-01-21 NOTE — Telephone Encounter (Signed)
Left message for patient to call back. Will also route to device clinic for a check.

## 2021-01-21 NOTE — Telephone Encounter (Signed)
STAT if HR is under 50 or over 120 (normal HR is 60-100 beats per minute)  What is your heart rate? Does not know  Do you have a log of your heart rate readings (document readings)? N/a  Do you have any other symptoms? weak, dizzy,    [name], but does not have a way to check her HR. She says she feels dizzy and weak, but does not feel faint. She says she does have a loop recorder in. She says she has taken her medication and does not think she is in afib.

## 2021-01-21 NOTE — Telephone Encounter (Signed)
Linq2 transmission from this morning does not show any new information that would explain patient symptoms.

## 2021-01-21 NOTE — Telephone Encounter (Signed)
Pt called to report that she woke up not feeling well this morning... she is not sure of she has a fever but she generally doe not feel well.   Pt says her BP today is 142/75 and HR 100 which she is concerned about ... she denies palpitations, chest pain.  She says she has recently been treated for a severe bladder infection which was on antibiotics for a large amount of time and she was even hospitalized.   Pt says she has been off of antibiotics for a few weeks now.   She says that she woke up with left sided lower back pain that radiates to the front lower part of her abdomen. No problems with urination except for increased frequency.   I have asked her to call her Urologist and possibly her PCP... Dr. Olen Pel.   I will also send to the device nurses per her request to be sure nothing on her Loop recorder that is concerning since she is very worried about her elevated hr... but I did advise her that if she has any Infectious process going on it can increase her HR.   Will forward.

## 2021-01-21 NOTE — Progress Notes (Signed)
Carelink Summary Report / Loop Recorder 

## 2021-01-24 NOTE — Telephone Encounter (Signed)
Reviewed with patient. She states she has a rough weekend, but feeling better/improved, today better. She appreciates the follow up on this.

## 2021-01-27 ENCOUNTER — Other Ambulatory Visit: Payer: Self-pay

## 2021-01-27 ENCOUNTER — Ambulatory Visit (INDEPENDENT_AMBULATORY_CARE_PROVIDER_SITE_OTHER): Payer: Medicare Other | Admitting: Dermatology

## 2021-01-27 ENCOUNTER — Encounter: Payer: Self-pay | Admitting: Dermatology

## 2021-01-27 DIAGNOSIS — L570 Actinic keratosis: Secondary | ICD-10-CM | POA: Diagnosis not present

## 2021-01-27 DIAGNOSIS — D2339 Other benign neoplasm of skin of other parts of face: Secondary | ICD-10-CM | POA: Diagnosis not present

## 2021-01-31 ENCOUNTER — Ambulatory Visit (INDEPENDENT_AMBULATORY_CARE_PROVIDER_SITE_OTHER): Payer: Medicare Other

## 2021-01-31 DIAGNOSIS — R55 Syncope and collapse: Secondary | ICD-10-CM

## 2021-02-02 LAB — CUP PACEART REMOTE DEVICE CHECK
Date Time Interrogation Session: 20220830175012
Implantable Pulse Generator Implant Date: 20210824

## 2021-02-07 ENCOUNTER — Encounter: Payer: Self-pay | Admitting: Dermatology

## 2021-02-07 NOTE — Progress Notes (Signed)
   Follow-Up Visit   Subjective  [name] is a 87 y.o. female who presents for the following: Follow-up (Left nose recheck pink ).  Check biopsy site and 1 other spot on nose Location:  Duration:  Quality:  Associated Signs/Symptoms: Modifying Factors:  Severity:  Timing: Context:   Objective  Well appearing patient in no apparent distress; mood and affect are within normal limits. Mid Tip of Nose Biopsy 3 months ago showed a combination of fibrous papule plus actinic keratosis.  Although patient is concerned with residual erythema and the appearance of the spot, the lesion is completely clear with some residual pinkness and perhaps a subtle indentation which may clear with time.  There is also a scaly papule along the rim of the left nostril which may be an early carcinoma but which the patient says is historically stable and she prefers no intervention for now.    A focused examination was performed including face.. Relevant physical exam findings are noted in the Assessment and Plan.   Assessment & Plan    AK (actinic keratosis) Mid Tip of Nose  Recheck biopsy on nose. Biopsy site looks good.    Possible new lesion on rim of nostril.  Patient requests no biopsy be done.  Consider Aldara in the winter.      I, Lavonna Monarch, MD, have reviewed all documentation for this visit.  The documentation on 02/07/21 for the exam, diagnosis, procedures, and orders are all accurate and complete.

## 2021-02-08 DIAGNOSIS — R300 Dysuria: Secondary | ICD-10-CM | POA: Diagnosis not present

## 2021-02-08 DIAGNOSIS — Z87440 Personal history of urinary (tract) infections: Secondary | ICD-10-CM | POA: Diagnosis not present

## 2021-02-08 DIAGNOSIS — J40 Bronchitis, not specified as acute or chronic: Secondary | ICD-10-CM | POA: Diagnosis not present

## 2021-02-08 DIAGNOSIS — R0789 Other chest pain: Secondary | ICD-10-CM | POA: Diagnosis not present

## 2021-02-08 DIAGNOSIS — L659 Nonscarring hair loss, unspecified: Secondary | ICD-10-CM | POA: Diagnosis not present

## 2021-02-10 ENCOUNTER — Telehealth: Payer: Self-pay | Admitting: Internal Medicine

## 2021-02-10 DIAGNOSIS — M810 Age-related osteoporosis without current pathological fracture: Secondary | ICD-10-CM | POA: Diagnosis not present

## 2021-02-10 NOTE — Progress Notes (Signed)
Carelink Summary Report / Loop Recorder 

## 2021-02-11 NOTE — Telephone Encounter (Signed)
Called and spoke with patient who states that Tuesday she went to PCP because she thought she had Pleurisy and they did a chest xray on her. She states they are supposed to be faxing over a copy for Dr. Melvyn Novas to look at. Patient wants wert to see it and let her know if she needs to come in for sooner appt.   Will route to Dr. Melvyn Novas and his nurse be keep eye out for CXR report

## 2021-02-11 NOTE — Telephone Encounter (Signed)
I have the cxr report and placed in Dr Gustavus Bryant lookat  Pt aware  She is fisnishing zpack her PCP put her on- last day is tomorrow  She is aware Dr Melvyn Novas will review when he comes back to clinic in [place] on 02/23/21

## 2021-02-11 NOTE — Telephone Encounter (Signed)
Don't see any in system yet

## 2021-02-14 ENCOUNTER — Telehealth: Payer: Self-pay

## 2021-02-14 NOTE — Telephone Encounter (Signed)
LINQ alert received. 1 new tachy event, duration 91mn 19sec, mean HR 188 1 new AF, duration 4hrs 175m, mean HR 91, burden 0.2%, +OAC 2 new symptom activations, 9/6 @! 14:13, EGM shows ST ~120 bpm 9/11 @ 06:48, AF with RVR ~150-180  Pt meds include- Eliquis, Diltiazem 30 mg 4 times daily PRN elevated HR.    Attempted to reach pt to assess symptoms and medication compliance.  No answer.  LVM requesting callback (DPR on file)

## 2021-02-14 NOTE — Telephone Encounter (Signed)
LMTCB for the pt 

## 2021-02-14 NOTE — Telephone Encounter (Signed)
No changes noted, no need to alter rx

## 2021-02-14 NOTE — Telephone Encounter (Signed)
Patient returned phone call.  States she went to PCP on Tuesday due to chest/ stomach pain that she suspected pleurisy.  She reports her PCP did not find an issues with her lungs.    Pt states yesterday her symptoms were feeling rapid/ racing heart rate, SOB and an aching feeling in her chest.    Pty reports yesterday she too  separate doses of PRN diltiazem approx 2 hours apart starting at 6:30am, she finally felt relief of symptoms just before noon.    Pt concerned that she has pain in the site where the loop recorder is implanted.  She denies any redness, swelling or drainage at the site.    Advised I would forward info to Dr Curt Bears for review.  Anticipate continued monitoring.

## 2021-02-15 NOTE — Telephone Encounter (Signed)
Spoke with pt and notified of results per Dr. Wert. Pt verbalized understanding and denied any questions. 

## 2021-02-15 NOTE — Telephone Encounter (Signed)
[phone] pt calling back

## 2021-03-14 ENCOUNTER — Encounter: Payer: Medicare Other | Admitting: Cardiology

## 2021-03-15 DIAGNOSIS — M810 Age-related osteoporosis without current pathological fracture: Secondary | ICD-10-CM | POA: Diagnosis not present

## 2021-03-17 ENCOUNTER — Encounter (INDEPENDENT_AMBULATORY_CARE_PROVIDER_SITE_OTHER): Payer: Self-pay

## 2021-03-17 ENCOUNTER — Encounter: Payer: Self-pay | Admitting: Cardiology

## 2021-03-17 ENCOUNTER — Other Ambulatory Visit: Payer: Self-pay

## 2021-03-17 ENCOUNTER — Ambulatory Visit (INDEPENDENT_AMBULATORY_CARE_PROVIDER_SITE_OTHER): Payer: Medicare Other | Admitting: Cardiology

## 2021-03-17 VITALS — BP 142/80 | HR 91 | Ht 64.0 in | Wt 123.2 lb

## 2021-03-17 DIAGNOSIS — R55 Syncope and collapse: Secondary | ICD-10-CM

## 2021-03-17 NOTE — Progress Notes (Signed)
Electrophysiology Office Note   Date:  03/17/2021   ID:  [name], DOB 11-09-1932, MRN [id number]  PCP:  Orpah Melter, MD  Cardiologist:  Nah[place] Primary Electrophysiologist:  Angelo Caroll Meredith Leeds, MD    Chief Complaint: Syncope   History of Present Illness: [name] is a 87 y.o. female who is being seen today for the evaluation of syncope at the request of Orpah Melter, MD. Presenting today for electrophysiology evaluation.  She has a history significant for hypertension, paroxysmal atrial fibrillation, SVT, and syncope.  She presented emergency room 12/12/2019 with episodes of syncope.  She woke up with palpitations and chest tightness.  She was walking the bathroom and woke up on the floor.  After the episode she had generalized weakness and chest tightness.  She is status post Linq monitor implant.  She initially was tried on diltiazem and Multaq for her episodes of SVT and atrial fibrillation but did not tolerate either of these medications.  Today, denies symptoms of palpitations, chest pain, shortness of breath, orthopnea, PND, lower extremity edema, claudication, dizziness, presyncope, syncope, bleeding, or neurologic sequela. The patient is tolerating medications without difficulties.  Since being seen she has done well.  She continues to have episodic palpitations, though they are infrequent and short-lived.  She is overall comfortable with her control.  She has had no further episodes of syncope.   Past Medical History:  Diagnosis Date   Arm numbness left    Back pain    BCC (basal cell carcinoma of skin) 03/16/1986   Left Shoulder (curet and excision)   BCC (basal cell carcinoma of skin) 03/10/1988   Mid Back (tx p bx)   BCC (basal cell carcinoma of skin) 01/28/1998   Upper Lip (curet and excision)   BCC (basal cell carcinoma of skin) 10/24/2005   Right Mid Back (curet and 5FU)   Bronchitis, chronic (HCC)    Depression    Dizziness    Fatigue     Hypertension    mild   Palpitations    SCCA (squamous cell carcinoma) of skin 05/08/2011   Left Hand (in situ) (tx p bx)   Skin cancer    Stress    Superficial basal cell carcinoma (BCC) 07/11/1995   Top Right Upper Leg (curet and 5FU)   Superficial basal cell carcinoma (BCC) 01/28/1998   Right Back, Upper Thigh (curet and cautery)   Superficial basal cell carcinoma (BCC) 02/18/2002   Upper Inner Right Leg (curet and cautery)   Superficial basal cell carcinoma (BCC) 02/18/2002   Lower Abdomen (curet and 5FU)   Superficial basal cell carcinoma (BCC) 01/27/2013   Right Inner Thigh (tx p bx)   Syncope    History of    Past Surgical History:  Procedure Laterality Date   APPENDECTOMY     CESAREAN SECTION  [phone]     Current Outpatient Medications  Medication Sig Dispense Refill   acetaminophen (TYLENOL) 500 MG tablet Take 1,000 mg by mouth every 6 (six) hours as needed for fever.     apixaban (ELIQUIS) 2.5 MG TABS tablet Take 1 tablet (2.5 mg total) by mouth 2 (two) times daily. TAKE 1 TABLET(2.5 MG) BY MOUTH TWICE DAILY 60 tablet 6   Ascorbic Acid (VITAMIN C) 100 MG tablet Take 100 mg by mouth daily.     cholecalciferol (VITAMIN D3) 25 MCG (1000 UNIT) tablet Take 1,000 Units by mouth daily.     Dextromethorphan-guaiFENesin (MUCINEX DM) 30-600 MG TB12 Take 1 tablet by mouth  daily. (Patient not taking: Reported on 01/27/2021)     diltiazem (CARDIZEM) 30 MG tablet Take 1 tablet (30 mg total) by mouth 4 (four) times daily as needed (for elevated heart rates). 30 tablet 1   famotidine (PEPCID) 20 MG tablet One after supper (Patient taking differently: Take 20 mg by mouth daily. One after supper) 30 tablet 11   feeding supplement (ENSURE ENLIVE / ENSURE PLUS) LIQD Take 237 mLs by mouth 2 (two) times daily between meals. 950 mL 12   folic acid (FOLVITE) 1 MG tablet Take 1 mg by mouth daily.     HYDROcodone-acetaminophen (NORCO/VICODIN) 5-325 MG tablet Take 1 tablet by mouth every 4  (four) hours as needed for moderate pain. (Patient taking differently: Take 1 tablet by mouth at bedtime.) 40 tablet 0   Multiple Vitamins-Minerals (ONE-A-DAY WOMENS 50 PLUS PO) Take 1 tablet by mouth daily.     pantoprazole (PROTONIX) 40 MG tablet Take 1 tablet (40 mg total) by mouth daily. Take 30-60 min before first meal of the day 30 tablet 2   No current facility-administered medications for this visit.    Allergies:   Hydralazine hcl, Ciprofloxacin, Penicillins, Sulfonamide derivatives, Avelox [moxifloxacin], Band-aid infection defense [bacitracin-polymyxin b], Mannitol, Multaq [dronedarone], Nitrofurantoin monohyd macro, Other, Prednisone, Reclast [zoledronic acid], Sulfa antibiotics, and Chlorpheniramine   Social History:  The patient  reports that she has never smoked. She has never used smokeless tobacco. She reports current alcohol use. She reports that she does not use drugs.   Family History:  The patient's family history includes Alcohol abuse in her father; Atrial fibrillation in her mother; Thyroid disease in her mother.   ROS:  Please see the history of present illness.   Otherwise, review of systems is positive for none.   All other systems are reviewed and negative.   PHYSICAL EXAM: VS:  There were no vitals taken for this visit. , BMI There is no height or weight on file to calculate BMI. GEN: Well nourished, well developed, in no acute distress  HEENT: normal  Neck: no JVD, carotid bruits, or masses Cardiac: RRR; no murmurs, rubs, or gallops,no edema  Respiratory:  clear to auscultation bilaterally, normal work of breathing GI: soft, nontender, nondistended, + BS MS: no deformity or atrophy  Skin: warm and dry, device site well healed Neuro:  Strength and sensation are intact Psych: euthymic mood, full affect  EKG:  EKG is ordered today. Personal review of the ekg ordered shows sinus rhythm, rate 91  Personal review of the device interrogation today. Results in  [place]: 10/24/2020: ALT 13; Magnesium 1.9 11/09/2020: BUN 15; Creatinine, Ser 0.72; Hemoglobin 14.3; Platelets 352; Potassium 4.1; Sodium 135    Lipid Panel     Component Value Date/Time   CHOL 166 01/09/2011 0455   TRIG 69 01/09/2011 0455   HDL 55 01/09/2011 0455   CHOLHDL 3.0 01/09/2011 0455   VLDL 14 01/09/2011 0455   LDLCALC 97 01/09/2011 0455     Wt Readings from Last 3 Encounters:  01/17/21 121 lb (54.9 kg)  11/09/20 119 lb (54 kg)  10/11/20 118 lb (53.5 kg)      Other studies Reviewed: Additional studies/ records that were reviewed today include: Cardiac monitor 10/23/2019 personally reviewed Review of the above records today demonstrates:  Sinus rhythm including sinus bradycardia ( predominately at night ) Episodes of PSVT lasting between 6-8 seconds.    ASSESSMENT AND PLAN:  1.  Paroxysmal atrial fibrillation/SVT: CHA2DS2-VASc of  4.  Currently on Eliquis 5 mg twice daily.  She continues to have episodes, but they are all short-lived, less than 15 minutes.  She is overall comfortable with her control.  We Madolin Twaddle continue with current management.  She does state that the Linq monitor continues to be sore.  She may want it explanted at her next visit.  Syncope: Status post Linq monitor.  No further episodes of syncope.  Continue to monitor.   Current medicines are reviewed at length with the patient today.   The patient does not have concerns regarding her medicines.  The following changes were made today: None  Labs/ tests ordered today include:  No orders of the defined types were placed in this encounter.    Disposition:   FU with Rhyatt Muska 6 months  Signed, Alysia Scism Meredith Leeds, MD  03/17/2021 2:10 PM     [address] [place] [id number] [phone] (office) [phone] (fax)

## 2021-04-07 LAB — CUP PACEART REMOTE DEVICE CHECK
Date Time Interrogation Session: 20221103072015
Implantable Pulse Generator Implant Date: 20210824

## 2021-04-11 ENCOUNTER — Ambulatory Visit (INDEPENDENT_AMBULATORY_CARE_PROVIDER_SITE_OTHER): Payer: Medicare Other

## 2021-04-11 DIAGNOSIS — R55 Syncope and collapse: Secondary | ICD-10-CM | POA: Diagnosis not present

## 2021-04-15 NOTE — Progress Notes (Signed)
Carelink Summary Report / Loop Recorder 

## 2021-04-22 ENCOUNTER — Other Ambulatory Visit: Payer: Self-pay | Admitting: Gastroenterology

## 2021-04-22 DIAGNOSIS — R1032 Left lower quadrant pain: Secondary | ICD-10-CM

## 2021-04-22 DIAGNOSIS — K219 Gastro-esophageal reflux disease without esophagitis: Secondary | ICD-10-CM | POA: Diagnosis not present

## 2021-04-25 ENCOUNTER — Ambulatory Visit
Admission: RE | Admit: 2021-04-25 | Discharge: 2021-04-25 | Disposition: A | Discharge status: Home / Self Care | Payer: Medicare Other | Source: Ambulatory Visit | Attending: Gastroenterology | Admitting: Gastroenterology

## 2021-04-25 DIAGNOSIS — I700 Atherosclerosis of aorta: Secondary | ICD-10-CM | POA: Diagnosis not present

## 2021-04-25 DIAGNOSIS — K5730 Diverticulosis of large intestine without perforation or abscess without bleeding: Secondary | ICD-10-CM | POA: Diagnosis not present

## 2021-04-25 DIAGNOSIS — R1032 Left lower quadrant pain: Secondary | ICD-10-CM

## 2021-04-25 DIAGNOSIS — K6389 Other specified diseases of intestine: Secondary | ICD-10-CM | POA: Diagnosis not present

## 2021-04-25 DIAGNOSIS — R110 Nausea: Secondary | ICD-10-CM | POA: Diagnosis not present

## 2021-04-25 MED ORDER — IOPAMIDOL (ISOVUE-300) INJECTION 61%
100.0000 mL | Freq: Once | INTRAVENOUS | Status: AC | PRN
  Administered 2021-04-25: 100 mL via INTRAVENOUS

## 2021-05-12 LAB — CUP PACEART REMOTE DEVICE CHECK
Date Time Interrogation Session: 20221208090132
Implantable Pulse Generator Implant Date: 20210824

## 2021-05-16 ENCOUNTER — Ambulatory Visit (INDEPENDENT_AMBULATORY_CARE_PROVIDER_SITE_OTHER): Payer: Medicare Other

## 2021-05-16 DIAGNOSIS — R55 Syncope and collapse: Secondary | ICD-10-CM

## 2021-05-20 ENCOUNTER — Other Ambulatory Visit: Payer: Self-pay | Admitting: Internal Medicine

## 2021-05-20 DIAGNOSIS — R058 Other specified cough: Secondary | ICD-10-CM

## 2021-05-25 NOTE — Progress Notes (Signed)
Carelink Summary Report / Loop Recorder 

## 2021-06-17 DIAGNOSIS — R1084 Generalized abdominal pain: Secondary | ICD-10-CM | POA: Diagnosis not present

## 2021-06-17 DIAGNOSIS — K5720 Diverticulitis of large intestine with perforation and abscess without bleeding: Secondary | ICD-10-CM | POA: Diagnosis not present

## 2021-06-20 ENCOUNTER — Ambulatory Visit (INDEPENDENT_AMBULATORY_CARE_PROVIDER_SITE_OTHER): Payer: Medicare Other

## 2021-06-20 DIAGNOSIS — R55 Syncope and collapse: Secondary | ICD-10-CM | POA: Diagnosis not present

## 2021-06-22 LAB — CUP PACEART REMOTE DEVICE CHECK
Date Time Interrogation Session: 20230118054742
Implantable Pulse Generator Implant Date: 20210824

## 2021-06-30 NOTE — Progress Notes (Signed)
Carelink Summary Report / Loop Recorder 

## 2021-07-01 DIAGNOSIS — R1032 Left lower quadrant pain: Secondary | ICD-10-CM | POA: Diagnosis not present

## 2021-07-01 DIAGNOSIS — N281 Cyst of kidney, acquired: Secondary | ICD-10-CM | POA: Diagnosis not present

## 2021-07-22 ENCOUNTER — Ambulatory Visit: Payer: Medicare Other | Admitting: Internal Medicine

## 2021-07-25 ENCOUNTER — Ambulatory Visit (INDEPENDENT_AMBULATORY_CARE_PROVIDER_SITE_OTHER): Payer: Medicare Other

## 2021-07-25 DIAGNOSIS — R55 Syncope and collapse: Secondary | ICD-10-CM

## 2021-07-27 LAB — CUP PACEART REMOTE DEVICE CHECK
Date Time Interrogation Session: 20230222113720
Implantable Pulse Generator Implant Date: 20210824

## 2021-07-29 NOTE — Progress Notes (Signed)
Carelink Summary Report / Loop Recorder 

## 2021-08-01 ENCOUNTER — Ambulatory Visit (INDEPENDENT_AMBULATORY_CARE_PROVIDER_SITE_OTHER): Payer: Medicare Other | Admitting: Internal Medicine

## 2021-08-01 ENCOUNTER — Encounter: Payer: Self-pay | Admitting: Internal Medicine

## 2021-08-01 ENCOUNTER — Ambulatory Visit (INDEPENDENT_AMBULATORY_CARE_PROVIDER_SITE_OTHER): Payer: Medicare Other

## 2021-08-01 ENCOUNTER — Other Ambulatory Visit: Payer: Self-pay

## 2021-08-01 ENCOUNTER — Ambulatory Visit (INDEPENDENT_AMBULATORY_CARE_PROVIDER_SITE_OTHER): Payer: Medicare Other | Admitting: Dermatology

## 2021-08-01 DIAGNOSIS — L821 Other seborrheic keratosis: Secondary | ICD-10-CM | POA: Diagnosis not present

## 2021-08-01 DIAGNOSIS — D1801 Hemangioma of skin and subcutaneous tissue: Secondary | ICD-10-CM | POA: Diagnosis not present

## 2021-08-01 DIAGNOSIS — L918 Other hypertrophic disorders of the skin: Secondary | ICD-10-CM

## 2021-08-01 DIAGNOSIS — L570 Actinic keratosis: Secondary | ICD-10-CM

## 2021-08-01 DIAGNOSIS — Z85828 Personal history of other malignant neoplasm of skin: Secondary | ICD-10-CM | POA: Diagnosis not present

## 2021-08-01 DIAGNOSIS — R918 Other nonspecific abnormal finding of lung field: Secondary | ICD-10-CM | POA: Diagnosis not present

## 2021-08-01 DIAGNOSIS — J479 Bronchiectasis, uncomplicated: Secondary | ICD-10-CM | POA: Diagnosis not present

## 2021-08-01 DIAGNOSIS — R058 Other specified cough: Secondary | ICD-10-CM | POA: Diagnosis not present

## 2021-08-01 DIAGNOSIS — R059 Cough, unspecified: Secondary | ICD-10-CM | POA: Diagnosis not present

## 2021-08-01 MED ORDER — FAMOTIDINE 20 MG PO TABS: ORAL_TABLET | ORAL | 11 refills | Status: DC

## 2021-08-01 NOTE — Progress Notes (Signed)
Subjective:    Patient ID: [name], female    DOB: 19-Mar-1933    MRN: [id number]      Brief patient profile:  88 yowf never smoker/ MM   with recurrent cough since age 88 with right middle lobe/lingular syndrome with limited bronchiectatic changes on  CT study dated 09/2005    History of Present Illness    03/01/2016  f/u ov/[name] re:  Bronchiectasis with uacs/ some better on gerd rx  Chief Complaint  Patient presents with   Follow-up    Pt states her dry cough is unchanged since last OV. Pt states she has ocassional chest tightness and SOB but attributes this to stress. Pt also c/o hoarseness.    coughs up to an hour each am, completely non-productive even with flutter  rec No change in medications Nasty mucus > doxycline 100 mg twice daily x 10 days   Late add for nodular opacity RUL c/w MAI    zmax x 250 mg daily x 30 days then recheck cxr      02/03/2020  f/u ov/[name] re: bronchiectasis  Chief Complaint  Patient presents with   Follow-up    SOB and fatigue  Dyspnea:  Broke foot  When fell so no regular wallking now Cough: white, worse in am 's Sleeping: bed flat, 2 pillows  SABA use: none  02: none rec thyroid nodule f/u gerkin   08/02/2020  f/u ov/[name] re: bronchiectasis, not following action plan/ no longer on gerd rx  Chief Complaint  Patient presents with   Follow-up    Chest pain and productive cough  Dyspnea:  Steps one way up / heart pounds when go  Cough: changed colors in am x 6 weeks assoc with honking,not using mucinex, never started levaquin positonal Chest pain under the left breast x years but for the last 6 weeks worse with cough Sleeping: flat bed with 2 pillows - worse x 6 weeks due to increaseing cough  SABA use: none  02: none  Covid status:   vax x 2 rec For cough/congestion >>>   mucinex dm is up 1200 mg every 12 hours and use the flutter   For nasty mucus >  levaquin 500 mg one daily x 10 days Pantoprazole (protonix) 40 mg   Take   30-60 min before first meal of the day and Pepcid (famotidine)  20 mg one after Supper  until return to office - this is the best way to tell whether stomach acid is contributing to your problem.   GERD diet  We will be calling you for allergy evaluation >>> not done as of 10/11/2020  cxr >> no change     10/11/2020  f/u ov/[name] re: bronchiectasis / s/p macrodantin for UTI and "not right since"  Chief Complaint  Patient presents with   Follow-up    Increased cough for the past couple of months- prod with white sputum.   Dyspnea:  More weak than sob limiting activty  Cough:white sputum / first thing am  Then worse with talking or taking deep breath Sleeping: bed is flat/ 2 pillows and eventually quiets down  SABA use: none  02: none  Covid status:   vax x 2 pfizer   Rec I recommend you go ahead and see  your allergist next     01/17/2021  f/u ov/[name] re: bronchiectasis / no recent levaquin Chief Complaint  Patient presents with   Follow-up    3 mo f/u. States she has noticed  that she tends to cough more at night and with exertion.   Dyspnea:  more weak than sob also L hip limiting  Cough: white mucus esp hs but better in am  Sleeping: bed blocks and pillows / if lies flat brings up green  mucus but not typically in am SABA use: flutter valve only  02: none  Covid status:   vax  x 2 and never infected Rec Sputum in morning for afb and routine stain and culure>>> not done as of 08/01/2021       08/01/2021  f/u ov/[name] re: bronchiectasis  maint on prn levaquin / not able to produced am mucus  Chief Complaint  Patient presents with   Follow-up    Doing ok. Coughing is better.    Dyspnea:  limited more by hips than breathing  Cough: rattle in am / no mucus  Sleeping: bed blocks,  SABA use: none  02: none      No obvious day to day or daytime variability or assoc excess/ purulent sputum or mucus plugs or hemoptysis or cp or chest tightness, subjective wheeze or overt sinus or  hb symptoms.   Sleeping  without nocturnal  or early am exacerbation  of respiratory  c/o's or need for noct saba. Also denies any obvious fluctuation of symptoms with weather or environmental changes or other aggravating or alleviating factors except as outlined above   No unusual exposure hx or h/o childhood pna/ asthma or knowledge of premature birth.  Current Allergies, Complete Past Medical History, Past Surgical History, Family History, and Social History were reviewed in [employer] record.  ROS  The following are not active complaints unless bolded Hoarseness, sore throat, dysphagia, dental problems, itching, sneezing,  nasal congestion or discharge of excess mucus or purulent secretions, ear ache,   fever, chills, sweats, unintended wt loss or wt gain, classically pleuritic or exertional cp,  orthopnea pnd or arm/hand swelling  or leg swelling, presyncope, palpitations, abdominal pain, anorexia, nausea, vomiting, diarrhea  or change in bowel habits or change in bladder habits, change in stools or change in urine, dysuria, hematuria,  rash, arthralgias, visual complaints, headache, numbness, weakness or ataxia or problems with walking or coordination,  change in mood or  memory.        Current Meds  Medication Sig   acetaminophen (TYLENOL) 500 MG tablet Take 1,000 mg by mouth every 6 (six) hours as needed for fever.   apixaban (ELIQUIS) 2.5 MG TABS tablet Take 1 tablet (2.5 mg total) by mouth 2 (two) times daily. TAKE 1 TABLET(2.5 MG) BY MOUTH TWICE DAILY   Ascorbic Acid (VITAMIN C) 100 MG tablet Take 100 mg by mouth daily.   cholecalciferol (VITAMIN D3) 25 MCG (1000 UNIT) tablet Take 1,000 Units by mouth daily.   denosumab (PROLIA) 60 MG/ML SOSY injection Inject 60 mg into the skin every 6 (six) months.   Dextromethorphan-guaiFENesin (MUCINEX DM) 30-600 MG TB12 Take 1 tablet by mouth daily.   diltiazem (CARDIZEM) 30 MG tablet Take 1 tablet (30 mg total) by mouth 4  (four) times daily as needed (for elevated heart rates).   famotidine (PEPCID) 20 MG tablet One after supper (Patient taking differently: Take 20 mg by mouth daily. One after supper)   feeding supplement (ENSURE ENLIVE / ENSURE PLUS) LIQD Take 237 mLs by mouth 2 (two) times daily between meals.   folic acid (FOLVITE) 1 MG tablet Take 1 mg by mouth daily.   HYDROcodone-acetaminophen (NORCO/VICODIN) 5-325  MG tablet Take 1 tablet by mouth every 4 (four) hours as needed for moderate pain. (Patient taking differently: Take 1 tablet by mouth at bedtime.)   Multiple Vitamins-Minerals (ONE-A-DAY WOMENS 50 PLUS PO) Take 1 tablet by mouth daily.   pantoprazole (PROTONIX) 40 MG tablet TAKE 1 TABLET(40 MG) BY MOUTH DAILY 30 TO 60 MINUTES BEFORE FIRST MEAL OF THE DAY            Past Medical History:  BRONCHIECTASIS (ICD-494.0) see CT scan of the chest dated 09/29/2005  - Pneumovax April 28, 2009 (over 65 so last one)  Prevnar given 04/27/2014   - Alpha one Screen  August 18, 2010 = MM  - Sinus CT August 18, 2010 >>  neg  COUGH, CHRONIC (ICD-786.2)  Left Kidney Cyst  - 04/21/09 [facility] PROCEDURE(S): ULTRASOUND GUIDED AND FLUOROSCOPIC GUIDED LEFT RENAL CYST ASPIRATION AND SCLEROSIS  LUQ Abd pain 2011...............................................................Marland KitchenSchooler  - CT ABD 07/29/10 no etiology        Objective:   Physical Exam  Wt  08/01/2021         126  01/17/2021         121  10/11/2020           118 08/02/2020         126   02/03/2020        124  Wt  09/19/2019  120  07/22/2018         122  05/20/2018       129  11/30/2017         131  March 15,2012 137     Vital signs reviewed  08/01/2021  - Note at rest 02 sats  96% on RA   General appearance:    amb stoic wf nad  HEENT : pt wearing mask not removed for exam due to covid - 19 concerns.   NECK :  without JVD/Nodes/TM/ nl carotid upstrokes bilaterally   LUNGS: no acc muscle use,  Min barrel  contour chest wall with bilateral  slightly  decreased bs s audible wheeze and  without cough on insp or exp maneuvers and min  Hyperresonant  to  percussion bilaterally     CV:  RRR  no s3 or murmur or increase in P2, and no edema   ABD:  soft and nontender with pos end  insp Hoover's  in the supine position. No bruits or organomegaly appreciated, bowel sounds nl  MS:   Nl gait/  ext warm without deformities, calf tenderness, cyanosis or clubbing No obvious joint restrictions   SKIN: warm and dry without lesions    NEURO:  alert, approp, nl sensorium with  no motor or cerebellar deficits apparent.             CXR PA and Lateral:   08/01/2021 :    I personally reviewed images and agree with radiology impression as follows:    Chronic nodular interstitial changes are again noted. Interstitial prominence may have increased slightly from prior exam. Progression of interstitial lung disease including the possibility of progressive mycobacterium infection cannot be excluded.             Assessment & Plan:

## 2021-08-01 NOTE — Patient Instructions (Addendum)
Please remember to go to the  x-ray department  for your tests - we will call you with the results when they are available    Please schedule a follow up visit in 6  months but call sooner if needed  Add  HRCT then consider BAL

## 2021-08-04 ENCOUNTER — Telehealth: Payer: Self-pay | Admitting: *Deleted

## 2021-08-04 ENCOUNTER — Encounter: Payer: Self-pay | Admitting: Internal Medicine

## 2021-08-04 DIAGNOSIS — J479 Bronchiectasis, uncomplicated: Secondary | ICD-10-CM

## 2021-08-04 NOTE — Telephone Encounter (Signed)
-----   Message from Tanda Rockers, MD sent at 08/04/2021  8:40 AM EST ----- ?I informed pt today that p review of cxr/ record she needs HRCT next available dx bronchiectasis then I will call to arrange BAL  with one of our proceduralists  ? ?

## 2021-08-04 NOTE — Telephone Encounter (Signed)
HRCT ordered ? ?

## 2021-08-04 NOTE — Assessment & Plan Note (Addendum)
Onset ? Age 88 wit bronchiectasis first confirmed 2007  ?- Alpha one Screen  August 18, 2010 = MM  ?- See CT Chest 09/29/05 Stable bronchiectasis in right middle lobe and lingula.  ?Scattered tree in bud opacities throughout the lungs, right greater than left,  ?nonspecific.   ?- PFT's 06/21/2012 1.67 (91%) ratio 64 and no change,  DLCO 77% ?- Flutter valve added 04/27/2014  ?- PFTs 05/24/15   FEV1 1.55 (81%) ratio 69 p 6% improvement from saba and dlco 62% ratio 88%  ?- HRCT 04/04/16 >>> ?1. 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 ?2. An incidental finding of potential clinical significance has ?been found. Dominant nodule in the right upper lobe, likely ?infectious/inflammatory in etiology.  ?- Zmax 250 mg daily 03/01/16 - 05/01/16 improved cough at d/c but diarrhea while on it and no change in nodule RUL > d/c 07/07/16 ?- 08/28/2017 added back cycles of zpak prn flare of purulent bronchitis > d/c 05/20/2018  ?- levaquin 500 x 10 days prn 06/11/2018 ?- flutter valve training 06/24/18 ?Chest CT 09/09/2018 ?Increased tree-in-bud nodularity in right lower lobe since 2016 ?-10/11/2020  new L lung mid nodule noted and likely inflammatory  ?- 01/17/2021    Am sputum samples ? ?>>>not done as of 08/01/2021 and cxr worse so rec BAL but get HRCT 1st  ? ?  ?

## 2021-08-04 NOTE — Assessment & Plan Note (Signed)
Trial of max gerd rx 11/24/2015 >> worse off rx 03/29/2016 and 08/28/2017 > resume ppi qam ac> resolved as of 11/30/2017   ?- Flared again 04/2018 > rechallenged 06/11/2018 and did not follow instructions> resolved 07/22/2018  ?- worse again 09/19/2019 rec increase pepcid to 20 mg bid and diet/bed blocks, f/u ent prn ([facility]) ?- 10/11/2020 referred to allergy > not done as of 08/01/2021  ? ?Reconsider allergy eval at later date if symptoms refractory to present rx ? ?    ?  ? ?Each maintenance medication was reviewed in detail including emphasizing most importantly the difference between maintenance and prns and under what circumstances the prns are to be triggered using an action plan format where appropriate. ? ?Total time for H and P, chart review, counseling, ) and generating customized AVS unique to this office visit / same day charting = 15 min  ?     ? ?

## 2021-08-09 ENCOUNTER — Ambulatory Visit (HOSPITAL_COMMUNITY)
Admission: RE | Admit: 2021-08-09 | Discharge: 2021-08-09 | Disposition: A | Discharge status: Home / Self Care | Payer: Medicare Other | Source: Ambulatory Visit | Attending: Internal Medicine | Admitting: Internal Medicine

## 2021-08-09 ENCOUNTER — Other Ambulatory Visit: Payer: Self-pay

## 2021-08-09 DIAGNOSIS — J479 Bronchiectasis, uncomplicated: Secondary | ICD-10-CM | POA: Insufficient documentation

## 2021-08-09 DIAGNOSIS — R918 Other nonspecific abnormal finding of lung field: Secondary | ICD-10-CM | POA: Diagnosis not present

## 2021-08-09 DIAGNOSIS — I2510 Atherosclerotic heart disease of native coronary artery without angina pectoris: Secondary | ICD-10-CM | POA: Diagnosis not present

## 2021-08-09 DIAGNOSIS — I700 Atherosclerosis of aorta: Secondary | ICD-10-CM | POA: Diagnosis not present

## 2021-08-11 ENCOUNTER — Encounter (HOSPITAL_COMMUNITY): Payer: Self-pay | Admitting: Emergency Medicine

## 2021-08-11 ENCOUNTER — Emergency Department (HOSPITAL_COMMUNITY)
Admission: EM | Admit: 2021-08-11 | Discharge: 2021-08-11 | Disposition: A | Discharge status: Home / Self Care | Payer: Medicare Other | Attending: Emergency Medicine | Admitting: Emergency Medicine

## 2021-08-11 ENCOUNTER — Emergency Department (HOSPITAL_COMMUNITY): Payer: Medicare Other

## 2021-08-11 ENCOUNTER — Telehealth: Payer: Self-pay | Admitting: Cardiology

## 2021-08-11 DIAGNOSIS — R002 Palpitations: Secondary | ICD-10-CM

## 2021-08-11 DIAGNOSIS — Z7901 Long term (current) use of anticoagulants: Secondary | ICD-10-CM | POA: Diagnosis not present

## 2021-08-11 DIAGNOSIS — I10 Essential (primary) hypertension: Secondary | ICD-10-CM | POA: Diagnosis not present

## 2021-08-11 DIAGNOSIS — I4891 Unspecified atrial fibrillation: Secondary | ICD-10-CM

## 2021-08-11 DIAGNOSIS — R000 Tachycardia, unspecified: Secondary | ICD-10-CM | POA: Diagnosis not present

## 2021-08-11 DIAGNOSIS — I491 Atrial premature depolarization: Secondary | ICD-10-CM | POA: Diagnosis not present

## 2021-08-11 DIAGNOSIS — R457 State of emotional shock and stress, unspecified: Secondary | ICD-10-CM | POA: Diagnosis not present

## 2021-08-11 LAB — BASIC METABOLIC PANEL
Anion gap: 8 (ref 5–15)
BUN: 14 mg/dL (ref 8–23)
CO2: 25 mmol/L (ref 22–32)
Calcium: 9.1 mg/dL (ref 8.9–10.3)
Chloride: 105 mmol/L (ref 98–111)
Creatinine, Ser: 0.99 mg/dL (ref 0.44–1.00)
GFR, Estimated: 55 mL/min — ABNORMAL LOW (ref >60)
Glucose, Bld: 75 mg/dL (ref 70–99)
Potassium: 4 mmol/L (ref 3.5–5.1)
Sodium: 138 mmol/L (ref 135–145)

## 2021-08-11 LAB — CBC WITH DIFFERENTIAL/PLATELET
Abs Immature Granulocytes: 0.11 10*3/uL — ABNORMAL HIGH (ref 0.00–0.07)
Basophils Absolute: 0.1 10*3/uL (ref 0.0–0.1)
Basophils Relative: 1 %
Eosinophils Absolute: 0.1 10*3/uL (ref 0.0–0.5)
Eosinophils Relative: 1 %
HCT: 46.2 % — ABNORMAL HIGH (ref 36.0–46.0)
Hemoglobin: 15.3 g/dL — ABNORMAL HIGH (ref 12.0–15.0)
Immature Granulocytes: 1 %
Lymphocytes Relative: 12 %
Lymphs Abs: 1.4 10*3/uL (ref 0.7–4.0)
MCH: 30.8 pg (ref 26.0–34.0)
MCHC: 33.1 g/dL (ref 30.0–36.0)
MCV: 93 fL (ref 80.0–100.0)
Monocytes Absolute: 0.8 10*3/uL (ref 0.1–1.0)
Monocytes Relative: 7 %
Neutro Abs: 9.9 10*3/uL — ABNORMAL HIGH (ref 1.7–7.7)
Neutrophils Relative %: 78 %
Platelets: 294 10*3/uL (ref 150–400)
RBC: 4.97 MIL/uL (ref 3.87–5.11)
RDW: 13.6 % (ref 11.5–15.5)
WBC: 12.5 10*3/uL — ABNORMAL HIGH (ref 4.0–10.5)
nRBC: 0 % (ref 0.0–0.2)

## 2021-08-11 LAB — TROPONIN I (HIGH SENSITIVITY)
Troponin I (High Sensitivity): 12 ng/L (ref <18)
Troponin I (High Sensitivity): 11 ng/L (ref <18)

## 2021-08-11 LAB — D-DIMER, QUANTITATIVE: D-Dimer, Quant: 0.52 ug/mL-FEU — ABNORMAL HIGH (ref 0.00–0.50)

## 2021-08-11 NOTE — ED Provider Notes (Signed)
? ?[facility]  ?Provider Note ? ?CSN: [id number] ?Arrival date & time: 08/11/21 1339 ? ?History ?Chief Complaint  ?Patient presents with  ? Palpitations  ? ? ?[name] is a 88 y.o. female who presents today with palpitations.  Patient states she woke up this morning with palpitations.  She felt her heart going fast.  She called her cardiologist.  They recommended she take extra Cardizem.  She did this.  Her symptoms have since resolved.  She had no point has felt short of breath, however she was having some intermittent dyspnea here in the emergency department.  Recent travel.  No leg swelling or leg pain.  No hemoptysis.  Patient is on blood thinners. ? ? ?Home Medications ?Prior to Admission medications   ?Medication Sig Start Date End Date Taking? Authorizing Provider  ?acetaminophen (TYLENOL) 500 MG tablet Take 1,000 mg by mouth every 6 (six) hours as needed for fever.    [provider]  ?apixaban (ELIQUIS) 2.5 MG TABS tablet Take 1 tablet (2.5 mg total) by mouth 2 (two) times daily. TAKE 1 TABLET(2.5 MG) BY MOUTH TWICE DAILY 12/09/20   Nahser, Wonda Cheng, MD  ?Ascorbic Acid (VITAMIN C) 100 MG tablet Take 100 mg by mouth daily.    [provider]  ?cholecalciferol (VITAMIN D3) 25 MCG (1000 UNIT) tablet Take 1,000 Units by mouth daily.    [provider]  ?denosumab (PROLIA) 60 MG/ML SOSY injection Inject 60 mg into the skin every 6 (six) months.    [provider]  ?Dextromethorphan-guaiFENesin (MUCINEX DM) 30-600 MG TB12 Take 1 tablet by mouth daily.    [provider]  ?diltiazem (CARDIZEM) 30 MG tablet Take 1 tablet (30 mg total) by mouth 4 (four) times daily as needed (for elevated heart rates). 01/11/21   Camnitz, Ocie Doyne, MD  ?famotidine (PEPCID) 20 MG tablet One after bfast and one after supper 08/01/21   Tanda Rockers, MD  ?feeding supplement (ENSURE ENLIVE / ENSURE PLUS) LIQD Take 237 mLs by mouth 2 (two) times  daily between meals. 10/26/20   Ezekiel Slocumb, DO  ?folic acid (FOLVITE) 1 MG tablet Take 1 mg by mouth daily.    [provider]  ?HYDROcodone-acetaminophen (NORCO/VICODIN) 5-325 MG tablet Take 1 tablet by mouth every 4 (four) hours as needed for moderate pain. ?Patient taking differently: Take 1 tablet by mouth at bedtime. 06/24/18   Tanda Rockers, MD  ?Multiple Vitamins-Minerals (ONE-A-DAY WOMENS 50 PLUS PO) Take 1 tablet by mouth daily.    [provider]  ? ? ? ?Allergies    ?Hydralazine hcl, Ciprofloxacin, Penicillins, Sulfonamide derivatives, Avelox [moxifloxacin], Band-aid infection defense [bacitracin-polymyxin b], Mannitol, Multaq [dronedarone], Nitrofurantoin monohyd macro, Other, Prednisone, Reclast [zoledronic acid], Sulfa antibiotics, and Chlorpheniramine ? ? ?Review of Systems   ?Review of Systems  ?Constitutional:  Negative for chills and fever.  ?HENT:  Negative for ear pain and sore throat.   ?Eyes:  Negative for pain and visual disturbance.  ?Respiratory:  Negative for cough and shortness of breath.   ?Cardiovascular:  Positive for palpitations. Negative for chest pain.  ?Gastrointestinal:  Negative for abdominal pain and vomiting.  ?Genitourinary:  Negative for dysuria and hematuria.  ?Musculoskeletal:  Negative for arthralgias and back pain.  ?Skin:  Negative for color change and rash.  ?Neurological:  Negative for seizures and syncope.  ?All other systems reviewed and are negative. ?Please see HPI for pertinent positives and negatives ? ?Physical Exam ?BP Marland Kitchen)  148/68   Pulse 94   Temp 98 ?F (36.7 ?C) (Oral)   Resp (!) 27   SpO2 100%  ? ?Physical Exam ?Vitals and nursing note reviewed.  ?Constitutional:   ?   General: She is not in acute distress. ?   Appearance: She is well-developed.  ?HENT:  ?   Head: Normocephalic and atraumatic.  ?Eyes:  ?   Conjunctiva/sclera: Conjunctivae normal.  ?Cardiovascular:  ?   Rate and Rhythm: Normal rate and regular rhythm.  ?   Heart  sounds: No murmur heard. ?Pulmonary:  ?   Effort: Pulmonary effort is normal. No respiratory distress.  ?   Breath sounds: Normal breath sounds.  ?Abdominal:  ?   Palpations: Abdomen is soft.  ?   Tenderness: There is no abdominal tenderness.  ?Musculoskeletal:     ?   General: No swelling.  ?   Cervical back: Neck supple.  ?Skin: ?   General: Skin is warm and dry.  ?   Capillary Refill: Capillary refill takes less than 2 seconds.  ?Neurological:  ?   Mental Status: She is alert.  ?Psychiatric:     ?   Mood and Affect: Mood normal.  ? ? ?ED Results / Procedures / Treatments   ?EKG ?EKG Interpretation ? ?Date/Time:  Thursday August 11 2021 13:52:21 EST ?Ventricular Rate:  95 ?PR Interval:  182 ?QRS Duration: 88 ?QT Interval:  340 ?QTC Calculation: 427 ?R Axis:   90 ?Text Interpretation: Normal sinus rhythm Rightward axis Cannot rule out Anterior infarct , age undetermined Abnormal ECG When compared with ECG of 09-Nov-2020 12:54, No significant change since last tracing Confirmed by Gareth Morgan [phone]) on 08/11/2021 6:35:43 PM ? ?Procedures ?Procedures ? ?Medications Ordered in the ED ?Medications - No data to display ? ? ?ED Course  ? ?  ? ? ?MDM  ? ?This patient presents to the ED for concern of palpitations, this involves an extensive number of treatment options, and is a complaint that carries with it a high risk of complications and morbidity.  The differential diagnosis includes A-fib RVR, ACS, PE. Patient?s presentation is complicated by their history of A-fib on Eliquis ? ? ?Additional history obtained: ?Records reviewed Care Everywhere/External Records and Primary Care Documents ? ?Lab Tests: ?I Ordered, and personally interpreted labs.  The pertinent results include: D-dimer was within normal limits for her age.  Remainder of labs are unremarkable. ? ?Imaging Studies ordered: ?I ordered imaging studies including X-ray chest   ?I independently visualized and interpreted imaging which showed no acute  abnormalities ?I agree with the radiologist interpretation ? ?EKG (personally reviewed and interpreted): Normal sinus rhythm.  No STEMI ? ? ?Medical Decision Making: Patient presented today after having palpitations this morning.  Symptoms have since resolved.  She is on Eliquis, though she is not always compliant.  She was reportedly on 3 L of oxygen at some point during the day, however she now is saturating well on room air.  She continues saturate well on room air after being taken off the oxygen.  She was able to ambulate without difficulty.  Her D-dimer was negative making PE extremely unlikely.  She had no signs of ACS on her EKG and was having no chest pain.  No electrode abnormalities.  Patient likely having A-fib because she is not compliant with her medications.  She stated that she does not always take her heart medicines or her blood pressure medicines or her blood thinners.  We had a lengthy  discussion regarding the importance of compliance given her history and the possible risks of not taking her medications.  She voiced understanding and agreement.  Do not feel she requires admission at this time.  She plans to follow-up with her doctors in the outpatient setting.  We discussed strict return precautions and the importance of close outpatient follow-up.  She voiced understanding and agreement with this plan. ? ?Complexity of problems addressed: ?Patient?s presentation is most consistent with  acute presentation with potential threat to life or bodily function ? ?Disposition: ?After consideration of the diagnostic results and the patient?s response to treatment,  ?I feel that the patent would benefit from discharge home .  ? ?Patient seen in conjunction with my attending, Dr. Billy Fischer. ? ? ? ?Final Clinical Impression(s) / ED Diagnoses ?Final diagnoses:  ?Palpitations  ?Atrial fibrillation, unspecified type ([place])  ? ? ?Rx / DC Orders ?ED Discharge Orders   ? ? None  ? ?  ? ? ?  ?Jacelyn Pi,  MD ?08/11/21 2318 ? ?  ?Gareth Morgan, MD ?08/12/21 1119 ? ?

## 2021-08-11 NOTE — ED Triage Notes (Signed)
Patient BIB [facility] from home with complaint of chest palpitations. Patient states she called cardiologist after taking '60mg'$  cardizem and cardiologist informed her to go to ED. Patient has taken 90 mg cardizem today. Patient also reports shortness of breath and chronic bronchitis, room air SpO2 100% on room air. 20g saline lock in left hand. ? ? ?

## 2021-08-11 NOTE — Telephone Encounter (Signed)
Patient c/o Palpitations:  High priority if patient c/o lightheadedness, shortness of breath, or chest pain ? ?How long have you had palpitations/irregular HR/ Afib? Since 7:00 am ?Are you having the symptoms now? yes ? ?Are you currently experiencing lightheadedness, SOB or CP? SOB ? ?Do you have a history of afib (atrial fibrillation) or irregular heart rhythm? yes ? ?Have you checked your BP or HR? (document readings if available): no. Patient said equipment is upstairs and she can not get to it  ? ?Are you experiencing any other symptoms?  ? ?Patient has taken two doses of diltiazem ?

## 2021-08-11 NOTE — ED Provider Triage Note (Signed)
Emergency Medicine Provider Triage Evaluation Note ? ?[name] , a 88 y.o. female  was evaluated in triage.  Pt complains of abnormal rhyhtm. Patient states she has a loop recorder and cardiologist told her she has been in atrial fibrillation since 3am. She took x3 doses of her diltiazem ('90mg'$  total) at the instruction of cardiology prior to coming to the ED. She states palpitations have subsided since then.  He denies chest pain however she states she intermittently feels her heart racing and feels like it is "up in my throat."  She denies any radiation of pain or symptoms to her arm or jaw.  She denies any shortness of breath, lightheadedness or dizziness, lower extremity swelling.  Anticoagulated on Eliquis 2.5 mg daily. ? ?Review of Systems  ?Positive: See above ?Negative:  ? ?Physical Exam  ?BP 134/81 (BP Location: Right Arm)   Pulse 93   Temp 98.3 ?F (36.8 ?C) (Oral)   Resp 16   SpO2 100%  ?Gen:   Awake, no distress   ?Resp:  Normal effort  ?MSK:   Moves extremities without difficulty  ?Other:  S1/S2 with regular rhythm.  Does not sound irregularly irregular at this time.  Pulses 2+ bilateral radial.  No lower extremity swelling ? ?Medical Decision Making  ?Medically screening exam initiated at 2:13 PM.  Appropriate orders placed.  Jeana Kersting was informed that the remainder of the evaluation will be completed by another provider, this initial triage assessment does not replace that evaluation, and the importance of remaining in the ED until their evaluation is complete. ? ? ?  ?Mickie Hillier, PA-C ?08/11/21 1415 ? ?

## 2021-08-11 NOTE — Telephone Encounter (Signed)
Pt called in stating that she can feel her heart pounding and has been since 0730 today. Is not able to verify current rate or BP because her equipment is located upstairs in her home and she doesn't feel like she can make it upstairs or move from chair she is currently sitting in. She states she has taken 2 doses of her Diltiazem '30mg'$  and is wondering if she should take more, last dose at 1000. Current script says up to QID prn. Pt sent transmission in via her ILR. Called and spoke to [place] in [facility] who pulled transmission and verified she is currently in A-fib and has been since 0333. Rate is between 75-150. Max v rate =188, median v rate 171. Pt denies CP, but attests to SOB, r-sided back pain, and states "I can feel it in my throat, but I'm not dizzy like before." Camnitz was consulted for advisement. He states since she cannot verify current rate or BP and she feels that bad, she should take one additional dose of Diltiazem '30mg'$  and go to ED for likely cardioversion. Relayed recommendations to patient. She states her son is in [facility] and she is scared, but she will call 911 now and proceed to [facility] ED. ?

## 2021-08-12 ENCOUNTER — Telehealth (HOSPITAL_COMMUNITY): Payer: Self-pay

## 2021-08-12 ENCOUNTER — Encounter: Payer: Self-pay | Admitting: Dermatology

## 2021-08-12 NOTE — Telephone Encounter (Signed)
Reached out to patient to schedule ED f/u. Patient stated she's under the care of Dr. Curt Bears and declined ED f/u.  ?

## 2021-08-12 NOTE — Progress Notes (Signed)
° °  Follow-Up Visit   Subjective  [name] is a 88 y.o. female who presents for the following: Follow-up (Patient here today for recheck of lesion on the rim of her nostril, per patient she never used the [place] that was recommended. No concerns today. Personal history of bcc and scc. ).  Recheck nose plus several other spots Location:  Duration:  Quality:  Associated Signs/Symptoms: Modifying Factors:  Severity:  Timing: Context:   Objective  Well appearing patient in no apparent distress; mood and affect are within normal limits. Neck - Anterior 2 mm flesh-colored pedunculated papule  Left Lower Back 3 mm smooth red dermal papule  Mid Back 5 mm brown textured papule  Right Tip of Nose Persistent 1.5 papule with some scale    A focused examination was performed including head, neck, back, arms examined. Relevant physical exam findings are noted in the Assessment and Plan.   Assessment & Plan    Skin tag Neck - Anterior  Patient may choose future removal.  Cherry angioma Left Lower Back  No treatment indicated  Seborrheic keratosis Mid Back  Leave if stable  AK (actinic keratosis) Right Tip of Nose  Patient chooses for now not to biopsy or do any treatment.  Will return for biopsy if there is growth or bleeding      I, Lavonna Monarch, MD, have reviewed all documentation for this visit.  The documentation on 08/12/21 for the exam, diagnosis, procedures, and orders are all accurate and complete.

## 2021-08-15 ENCOUNTER — Telehealth: Payer: Self-pay | Admitting: Cardiology

## 2021-08-15 NOTE — Telephone Encounter (Signed)
Received stat call into triage from patient stating that she feels like she is going to pass out. Patient is home alone, advised patient to hang up and call 911 but patient refused and states she is not going back to the ER. Patient reports that her BP has been running 469'G-295'M systolic, with HR in the 84'X. Patient states that her left eye feels funny like it did the last time that she had a syncopal event. Patient reports that she took her medications as scheduled this morning and took a dose of Diltiazem at 830. Patient reports that she also has a bad headache and states that her neck and shoulders hurt from sitting in the wheelchair at recent ER visit on 3/10. Patient would like to know if her ILR has picked up anything with her heart rhythm, advised patient I would forward message to Device clinic to follow up on this. Patient reports towards the end of the call that she feels a little better and reports that she no longer feels like she is going to pass out. Advised patient to stay well hydrated, change positions slowly, and to call 911 if symptoms worsen in any way. Will also forward message to MD for advice.  ?

## 2021-08-15 NOTE — Telephone Encounter (Signed)
.  Pt c/o Syncope: STAT if syncope occurred within 30 minutes and pt complains of lightheadedness ?High Priority if episode of passing out, completely, today or in last 24 hours  ? ?Did you pass out today? Feel like it now  ? ?When is the last time you passed out?   ? ?Has this occurred multiple times? yes  ? ?Did you have any symptoms prior to passing out?   ? ?

## 2021-08-15 NOTE — Progress Notes (Signed)
Spoke with pt and notified of results per Dr. Wert. Pt verbalized understanding and denied any questions. 

## 2021-08-15 NOTE — Telephone Encounter (Signed)
LINQ monitor updated at 3/13/ 23 midnight, patient's home monitor does not have the ability to force a transmission. ? ? If below conditions are met the device will automatically record, will look for updated information on 08/16/21.  ? ? ? ?

## 2021-08-16 NOTE — Telephone Encounter (Signed)
Today's transmission received and reviewed. Presenting rhythm sinus. No new episodes noted. Patient advised. Reinforced importance of medication compliance including eliquis. Patient c/o intermittent dizziness but does not endorse palpitations or high heart rate. Advised there may be other causes of dizziness and she may want to follow up with her PCP. Patient is taking BP at home daily and when she has symptoms. Today's reading 140s/80s ? ? ? ? ? ? ?

## 2021-08-22 ENCOUNTER — Other Ambulatory Visit: Payer: Self-pay

## 2021-08-22 MED ORDER — DILTIAZEM HCL 30 MG PO TABS: 30.0000 mg | ORAL_TABLET | Freq: Four times a day (QID) | ORAL | 1 refills | Status: DC | PRN

## 2021-08-29 ENCOUNTER — Ambulatory Visit (INDEPENDENT_AMBULATORY_CARE_PROVIDER_SITE_OTHER): Payer: Medicare Other

## 2021-08-29 DIAGNOSIS — R55 Syncope and collapse: Secondary | ICD-10-CM

## 2021-08-31 LAB — CUP PACEART REMOTE DEVICE CHECK
Date Time Interrogation Session: 20230329100141
Implantable Pulse Generator Implant Date: 20210824

## 2021-09-07 ENCOUNTER — Encounter (HOSPITAL_COMMUNITY): Payer: Self-pay | Admitting: Nurse Practitioner

## 2021-09-07 ENCOUNTER — Ambulatory Visit (HOSPITAL_COMMUNITY)
Admission: RE | Admit: 2021-09-07 | Discharge: 2021-09-07 | Disposition: A | Discharge status: Home / Self Care | Payer: Medicare Other | Source: Ambulatory Visit | Attending: Nurse Practitioner | Admitting: Nurse Practitioner

## 2021-09-07 VITALS — BP 144/68 | HR 82 | Ht 64.0 in | Wt 128.4 lb

## 2021-09-07 DIAGNOSIS — Z7901 Long term (current) use of anticoagulants: Secondary | ICD-10-CM | POA: Diagnosis not present

## 2021-09-07 DIAGNOSIS — D6869 Other thrombophilia: Secondary | ICD-10-CM

## 2021-09-07 DIAGNOSIS — I480 Paroxysmal atrial fibrillation: Secondary | ICD-10-CM | POA: Insufficient documentation

## 2021-09-07 MED ORDER — RIVAROXABAN 15 MG PO TABS: 15.0000 mg | ORAL_TABLET | Freq: Every day | ORAL | 1 refills | Status: DC

## 2021-09-07 NOTE — Progress Notes (Signed)
? ?Primary Care Physician: Orpah Melter, MD ?Referring Physician: Dr. [facility] ? ? ?[name] is a 88 y.o. female with a h/o of atrial fibrillation  found on her Linq 2012. I have not seen since 2012. At the time, she did not like being on eliquis with intermittent H/A, itchy face and stopped BB for weakness.  ? ?She presented to the ED 3/9 with heart racing , afib confirmed by device clinic  with RVR and she was instructed to go to the ER. She took (2) 30 mg cardizem's at home before going to the ED. By the time, she arrived in the ED, she had returned to [place]. She reported that she stopped eliquis 5 months prior because it made her feel bad. It was restarted in the ER and then she called with issues with feeling bad on eliquis again. She states that it makes her hands bruise, her eyes puffy, +stomach aches and overall weakness. She states  that she doe not trust herself to drive on eliquis. She felt this way with the first dose. We discussed trying xarelto or coumadin and she wants to try xarelto. CHA2DS2VASc  score of 4.she is in SR.  ? ?Today, she denies symptoms of palpitations, chest pain, shortness of breath, orthopnea, PND, lower extremity edema, dizziness, presyncope, syncope, or neurologic sequela. The patient is tolerating medications without difficulties and is otherwise without complaint today.  ? ?Past Medical History:  ?Diagnosis Date  ? Arm numbness left   ? Back pain   ? BCC (basal cell carcinoma of skin) 03/16/1986  ? Left Shoulder (curet and excision)  ? BCC (basal cell carcinoma of skin) 03/10/1988  ? Mid Back (tx p bx)  ? BCC (basal cell carcinoma of skin) 01/28/1998  ? Upper Lip (curet and excision)  ? BCC (basal cell carcinoma of skin) 10/24/2005  ? Right Mid Back (curet and 5FU)  ? Bronchitis, chronic ([place])   ? Depression   ? Dizziness   ? Fatigue   ? Hypertension   ? mild  ? Palpitations   ? SCCA (squamous cell carcinoma) of skin 05/08/2011  ? Left Hand (in situ) (tx p  bx)  ? Skin cancer   ? Stress   ? Superficial basal cell carcinoma (BCC) 07/11/1995  ? Top Right Upper Leg (curet and 5FU)  ? Superficial basal cell carcinoma (BCC) 01/28/1998  ? Right Back, Upper Thigh (curet and cautery)  ? Superficial basal cell carcinoma (BCC) 02/18/2002  ? Upper Inner Right Leg (curet and cautery)  ? Superficial basal cell carcinoma (BCC) 02/18/2002  ? Lower Abdomen (curet and 5FU)  ? Superficial basal cell carcinoma (BCC) 01/27/2013  ? Right Inner Thigh (tx p bx)  ? Syncope   ? History of   ? ?Past Surgical History:  ?Procedure Laterality Date  ? APPENDECTOMY    ? CESAREAN SECTION  [phone]  ? ? ?Current Outpatient Medications  ?Medication Sig Dispense Refill  ? acetaminophen (TYLENOL) 500 MG tablet Take 1,000 mg by mouth as needed for fever.    ? apixaban (ELIQUIS) 2.5 MG TABS tablet Take 1 tablet (2.5 mg total) by mouth 2 (two) times daily. TAKE 1 TABLET(2.5 MG) BY MOUTH TWICE DAILY 60 tablet 6  ? denosumab (PROLIA) 60 MG/ML SOSY injection Inject 60 mg into the skin every 6 (six) months.    ? diltiazem (CARDIZEM) 30 MG tablet Take 1 tablet (30 mg total) by mouth 4 (four) times daily as needed (for elevated heart rates). 30 tablet  1  ? famotidine (PEPCID) 20 MG tablet One after bfast and one after supper 60 tablet 11  ? feeding supplement (ENSURE ENLIVE / ENSURE PLUS) LIQD Take 237 mLs by mouth 2 (two) times daily between meals. 643 mL 12  ? folic acid (FOLVITE) 1 MG tablet Take 1 mg by mouth daily.    ? HYDROcodone-acetaminophen (NORCO/VICODIN) 5-325 MG tablet Take 1 tablet by mouth every 4 (four) hours as needed for moderate pain. (Patient taking differently: Take 1 tablet by mouth at bedtime.) 40 tablet 0  ? Multiple Vitamins-Minerals (ONE-A-DAY WOMENS 50 PLUS PO) Take 1 tablet by mouth daily.    ? Ascorbic Acid (VITAMIN C) 100 MG tablet Take 100 mg by mouth daily. (Patient not taking: Reported on 09/07/2021)    ? cholecalciferol (VITAMIN D) 25 MCG (1000 UNIT) tablet 1 tablet (Patient  not taking: Reported on 09/07/2021)    ? ?No current facility-administered medications for this encounter.  ? ? ?Allergies  ?Allergen Reactions  ? Hydralazine Hcl Shortness Of Breath  ?  With throat numbness/tingling and swelling  ? Ciprofloxacin Other (See Comments)  ?  triggered a migraine that was thought to be a TIA  ? Penicillins Rash  ?  Has patient had a PCN reaction causing immediate rash, facial/tongue/throat swelling, SOB or lightheadedness with hypotension: Yes ?Has patient had a PCN reaction causing severe rash involving mucus membranes or skin necrosis: No ?Has patient had a PCN reaction that required hospitalization: No ?Has patient had a PCN reaction occurring within the last 10 years: No ?If all of the above answers are "NO", then may proceed with Cephalosporin use.  ? Sulfonamide Derivatives Swelling  ? Apixaban Other (See Comments)  ?  Other reaction(s): weakness  ? Avelox [Moxifloxacin] Other (See Comments)  ?  Rash and severe tingling/numbness   ? Band-Aid Infection Defense [Bacitracin-Polymyxin B] Other (See Comments)  ?  unknown  ? Dome-Paste Bandage [Wound Dressings] Hives  ? Mannitol   ? Multaq [Dronedarone]   ?  Muscle aches  ? Nitrofurantoin Monohyd Macro   ?  Other reaction(s): due to lung disease  ? Other Other (See Comments)  ?  Water for injection, sterile - unknown  ? Prednisone   ?  Other reaction(s): rash  ? Reclast [Zoledronic Acid] Other (See Comments)  ?  hypertension  ? Sulfa Antibiotics Other (See Comments)  ?  unknown  ? Chlorpheniramine Other (See Comments)  ?  Headache, stomach ache, bladder "not working well"  ? ? ?Social History  ? ?Socioeconomic History  ? Marital status: Married  ?  Spouse name: Not on file  ? Number of children: Not on file  ? Years of education: Not on file  ? Highest education level: Not on file  ?Occupational History  ? Not on file  ?Tobacco Use  ? Smoking status: Never  ? Smokeless tobacco: Never  ?Vaping Use  ? Vaping Use: Never used  ?Substance and  Sexual Activity  ? Alcohol use: Yes  ?  Comment: social ETOH  ? Drug use: No  ? Sexual activity: Not on file  ?Other Topics Concern  ? Not on file  ?Social History Narrative  ? Not on file  ? ?Social Determinants of Health  ? ?Financial Resource Strain: Not on file  ?Food Insecurity: Not on file  ?Transportation Needs: Not on file  ?Physical Activity: Not on file  ?Stress: Not on file  ?Social Connections: Not on file  ?Intimate Partner Violence: Not on file  ? ? ?  Family History  ?Problem Relation Age of Onset  ? Atrial fibrillation Mother   ? Thyroid disease Mother   ? Alcohol abuse Father   ? ? ?ROS- All systems are reviewed and negative except as per the HPI above ? ?Physical Exam: ?Vitals:  ? 09/07/21 1311  ?BP: (!) 144/68  ?Pulse: 82  ?Weight: 58.2 kg  ?Height: '5\' 4"'$  (1.626 m)  ? ?Wt Readings from Last 3 Encounters:  ?09/07/21 58.2 kg  ?08/01/21 57.2 kg  ?03/17/21 55.9 kg  ? ? ?Labs: ?Lab Results  ?Component Value Date  ? NA 138 08/11/2021  ? K 4.0 08/11/2021  ? CL 105 08/11/2021  ? CO2 25 08/11/2021  ? GLUCOSE 75 08/11/2021  ? BUN 14 08/11/2021  ? CREATININE 0.99 08/11/2021  ? CALCIUM 9.1 08/11/2021  ? MG 1.9 10/24/2020  ? ?Lab Results  ?Component Value Date  ? INR 1.1 10/23/2020  ? ?Lab Results  ?Component Value Date  ? CHOL 166 01/09/2011  ? HDL 55 01/09/2011  ? [place] 97 01/09/2011  ? TRIG 69 01/09/2011  ? ? ? ?GEN- The patient is well appearing, alert and oriented x 3 today.   ?Head- normocephalic, atraumatic ?Eyes-  Sclera clear, conjunctiva pink ?Ears- hearing intact ?Oropharynx- clear ?Neck- supple, no JVP ?Lymph- no cervical lymphadenopathy ?Lungs- Clear to ausculation bilaterally, normal work of breathing ?Heart- Regular rate and rhythm, no murmurs, rubs or gallops, PMI not laterally displaced ?GI- soft, NT, ND, + BS ?Extremities- no clubbing, cyanosis, or edema ?MS- no significant deformity or atrophy ?Skin- no rash or lesion ?Psych- euthymic mood, full affect ?Neuro- strength and sensation are  intact ? ?EKG-NSR at 82 ms, pr int 196 ms, qrs int 90 ms, qtc 413 ms   ? ? ? ?Assessment and Plan: ?1.Paroxysmal afib ?Recently seen in ER for afib but had converted by arrival ?In SR today  ?She was placed

## 2021-09-07 NOTE — Progress Notes (Signed)
Carelink Summary Report / Loop Recorder 

## 2021-09-07 NOTE — Patient Instructions (Signed)
Stop eliquis ? ?Start Xarelto '15mg'$  once a day with supper (Start tonight) ? ? ?

## 2021-09-14 ENCOUNTER — Encounter: Payer: Medicare Other | Admitting: Student

## 2021-09-19 NOTE — Progress Notes (Deleted)
Cardiology Office Note Date:  09/19/2021  Patient ID:  Jamin, Humphries 1933/01/26, MRN [id number] PCP:  Orpah Melter, MD  Cardiologist:  *** Electrophysiologist: Dr. Curt Bears  ***refresh   Chief Complaint: *** planned f/u  History of Present Illness: Remona Boom is a 88 y.o. female with history of dizziness/syncope > loop implanted, AFib, SVT  She comes in today to be seen for Dr. Curt Bears, last seen by him Oct 2022, had some short lived palpitations only.  Was comfortable with this burden.  Mentioned some soreness at the loop, might want it removed in the future, planned ofr a 6 mo visit  More recently saw the AFib clinic after an ER visit for AFib w/RVR, referred to the ER by the device clinic RN, she took 2 doses of her PRN dilt prior to going to the ER and on arrival was in [place]. She was uneasy with Eliquis, bruising, gave her GI upset, and felt like it made her feel bad, she was switched to Xarelto (seems she felt poorly with Eliquis from day one and had self stopped it previously)  *** xarelto?  Bleeding, dose, symptoms *** AF burden, prn dilt seemed to work *** new AAD?   Device information MDT LINQ II implanted 01/27/2020  AFIB/AAD Hx Diagnosed 2012 Intolerant of diltiazem, Multaq, Eliquis  Past Medical History:  Diagnosis Date   Arm numbness left    Back pain    BCC (basal cell carcinoma of skin) 03/16/1986   Left Shoulder (curet and excision)   BCC (basal cell carcinoma of skin) 03/10/1988   Mid Back (tx p bx)   BCC (basal cell carcinoma of skin) 01/28/1998   Upper Lip (curet and excision)   BCC (basal cell carcinoma of skin) 10/24/2005   Right Mid Back (curet and 5FU)   Bronchitis, chronic ([facility])    Depression    Dizziness    Fatigue    Hypertension    mild   Palpitations    SCCA (squamous cell carcinoma) of skin 05/08/2011   Left Hand (in situ) (tx p bx)   Skin cancer    Stress    Superficial basal cell carcinoma (BCC) 07/11/1995   Top Right  Upper Leg (curet and 5FU)   Superficial basal cell carcinoma (BCC) 01/28/1998   Right Back, Upper Thigh (curet and cautery)   Superficial basal cell carcinoma (BCC) 02/18/2002   Upper Inner Right Leg (curet and cautery)   Superficial basal cell carcinoma (BCC) 02/18/2002   Lower Abdomen (curet and 5FU)   Superficial basal cell carcinoma (BCC) 01/27/2013   Right Inner Thigh (tx p bx)   Syncope    History of     Past Surgical History:  Procedure Laterality Date   APPENDECTOMY     CESAREAN SECTION  [phone]    Current Outpatient Medications  Medication Sig Dispense Refill   acetaminophen (TYLENOL) 500 MG tablet Take 1,000 mg by mouth as needed for fever.     Ascorbic Acid (VITAMIN C) 100 MG tablet Take 100 mg by mouth daily. (Patient not taking: Reported on 09/07/2021)     cholecalciferol (VITAMIN D) 25 MCG (1000 UNIT) tablet 1 tablet (Patient not taking: Reported on 09/07/2021)     denosumab (PROLIA) 60 MG/ML SOSY injection Inject 60 mg into the skin every 6 (six) months.     diltiazem (CARDIZEM) 30 MG tablet Take 1 tablet (30 mg total) by mouth 4 (four) times daily as needed (for elevated heart rates). 30 tablet 1  famotidine (PEPCID) 20 MG tablet One after bfast and one after supper 60 tablet 11   feeding supplement (ENSURE ENLIVE / ENSURE PLUS) LIQD Take 237 mLs by mouth 2 (two) times daily between meals. 616 mL 12   folic acid (FOLVITE) 1 MG tablet Take 1 mg by mouth daily.     HYDROcodone-acetaminophen (NORCO/VICODIN) 5-325 MG tablet Take 1 tablet by mouth every 4 (four) hours as needed for moderate pain. (Patient taking differently: Take 1 tablet by mouth at bedtime.) 40 tablet 0   Multiple Vitamins-Minerals (ONE-A-DAY WOMENS 50 PLUS PO) Take 1 tablet by mouth daily.     Rivaroxaban (XARELTO) 15 MG TABS tablet Take 1 tablet (15 mg total) by mouth daily with supper. 30 tablet 1   No current facility-administered medications for this visit.    Allergies:   Hydralazine hcl,  Ciprofloxacin, Penicillins, Sulfonamide derivatives, Apixaban, Avelox [moxifloxacin], Band-aid infection defense [bacitracin-polymyxin b], Dome-paste bandage [wound dressings], Mannitol, Multaq [dronedarone], Nitrofurantoin monohyd macro, Other, Prednisone, Reclast [zoledronic acid], Sulfa antibiotics, and Chlorpheniramine   Social History:  The patient  reports that she has never smoked. She has never used smokeless tobacco. She reports current alcohol use. She reports that she does not use drugs.   Family History:  The patient's family history includes Alcohol abuse in her father; Atrial fibrillation in her mother; Thyroid disease in her mother.  ROS:  Please see the history of present illness.    All other systems are reviewed and otherwise negative.   PHYSICAL EXAM:  VS:  There were no vitals taken for this visit. BMI: There is no height or weight on file to calculate BMI. Well nourished, well developed, in no acute distress HEENT: normocephalic, atraumatic Neck: no JVD, carotid bruits or masses Cardiac:  *** RRR; no significant murmurs, no rubs, or gallops Lungs:  *** CTA b/l, no wheezing, rhonchi or rales Abd: soft, nontender MS: no deformity or *** atrophy Ext: *** no edema Skin: warm and dry, no rash Neuro:  No gross deficits appreciated Psych: euthymic mood, full affect  *** ILR site is stable, no tethering or discomfort   EKG:  not done today  Device interrogation done today and reviewed by myself:  ***   05/20/2020; TTE  1. Left ventricular ejection fraction, by estimation, is 60 to 65%. The  left ventricle has normal function. The left ventricle has no regional  wall motion abnormalities. Left ventricular diastolic parameters are  consistent with Grade I diastolic  dysfunction (impaired relaxation).   2. Right ventricular systolic function is normal. The right ventricular  size is normal. There is normal pulmonary artery systolic pressure.   3. The mitral valve is  grossly normal. Trivial mitral valve  regurgitation.   4. The aortic valve is normal in structure. Aortic valve regurgitation is  mild.   Recent Labs: 10/24/2020: ALT 13; Magnesium 1.9 08/11/2021: BUN 14; Creatinine, Ser 0.99; Hemoglobin 15.3; Platelets 294; Potassium 4.0; Sodium 138  No results found for requested labs within last 8760 hours.   CrCl cannot be calculated (Patient's most recent lab result is older than the maximum 21 days allowed.).   Wt Readings from Last 3 Encounters:  09/07/21 128 lb 6.4 oz (58.2 kg)  08/01/21 126 lb 3.2 oz (57.2 kg)  03/17/21 123 lb 3.2 oz (55.9 kg)     Other studies reviewed: Additional studies/records reviewed today include: summarized above  ASSESSMENT AND PLAN:  Paroxysmal AFib CHA2DS2Vasc is 3, on *** Xarelto, appropriately dosed *** burden by symptoms  Syncope ***  Disposition: F/u with ***  Current medicines are reviewed at length with the patient today.  The patient did not have any concerns regarding medicines.  Venetia Night, PA-C 09/19/2021 10:47 AM     [facility] [place] [address] [phone] (office)  [phone] (fax)

## 2021-09-21 ENCOUNTER — Encounter: Payer: Medicare Other | Admitting: Physician Assistant

## 2021-10-03 ENCOUNTER — Ambulatory Visit (INDEPENDENT_AMBULATORY_CARE_PROVIDER_SITE_OTHER): Payer: Medicare Other

## 2021-10-03 ENCOUNTER — Other Ambulatory Visit (HOSPITAL_COMMUNITY): Payer: Self-pay | Admitting: *Deleted

## 2021-10-03 DIAGNOSIS — R55 Syncope and collapse: Secondary | ICD-10-CM | POA: Diagnosis not present

## 2021-10-03 MED ORDER — RIVAROXABAN 15 MG PO TABS: 15.0000 mg | ORAL_TABLET | Freq: Every day | ORAL | 3 refills | Status: DC

## 2021-10-04 LAB — CUP PACEART REMOTE DEVICE CHECK
Date Time Interrogation Session: 20230502084634
Implantable Pulse Generator Implant Date: 20210824

## 2021-10-17 NOTE — Progress Notes (Signed)
Carelink Summary Report / Loop Recorder 

## 2021-10-18 DIAGNOSIS — Z6822 Body mass index (BMI) 22.0-22.9, adult: Secondary | ICD-10-CM | POA: Diagnosis not present

## 2021-10-18 DIAGNOSIS — Z01419 Encounter for gynecological examination (general) (routine) without abnormal findings: Secondary | ICD-10-CM | POA: Diagnosis not present

## 2021-10-18 DIAGNOSIS — Z124 Encounter for screening for malignant neoplasm of cervix: Secondary | ICD-10-CM | POA: Diagnosis not present

## 2021-10-18 DIAGNOSIS — Z131 Encounter for screening for diabetes mellitus: Secondary | ICD-10-CM | POA: Diagnosis not present

## 2021-10-18 DIAGNOSIS — Z01411 Encounter for gynecological examination (general) (routine) with abnormal findings: Secondary | ICD-10-CM | POA: Diagnosis not present

## 2021-10-18 DIAGNOSIS — M810 Age-related osteoporosis without current pathological fracture: Secondary | ICD-10-CM | POA: Diagnosis not present

## 2021-10-18 DIAGNOSIS — Z0142 Encounter for cervical smear to confirm findings of recent normal smear following initial abnormal smear: Secondary | ICD-10-CM | POA: Diagnosis not present

## 2021-10-18 DIAGNOSIS — Z0000 Encounter for general adult medical examination without abnormal findings: Secondary | ICD-10-CM | POA: Diagnosis not present

## 2021-10-18 DIAGNOSIS — Z1329 Encounter for screening for other suspected endocrine disorder: Secondary | ICD-10-CM | POA: Diagnosis not present

## 2021-10-18 DIAGNOSIS — Z13220 Encounter for screening for lipoid disorders: Secondary | ICD-10-CM | POA: Diagnosis not present

## 2021-10-24 ENCOUNTER — Telehealth: Payer: Self-pay | Admitting: Cardiology

## 2021-10-24 ENCOUNTER — Telehealth (HOSPITAL_COMMUNITY): Payer: Self-pay | Admitting: *Deleted

## 2021-10-24 NOTE — Telephone Encounter (Signed)
Pt reports having burning from bottom of stomach to top of throat, back pain and leg weakness; that has occurred for over 2 days.  Reports CP that feels as if something is sitting on chest.  Advised pt to report to ED or call 911 pt refuses. Expresses went to ED previously waited for 9 hours and no one ever explained what was occurring.  I again advised pt that with active CP our recommendation is to report to the ED.  Pt reports had an abscess when asked where pt could not give a clear answer.  Pt reports has a loop recorder and has pushed the button plenty of times and no one has called back.  Pt reports took diltiazem 30 mg about 30-40 minutes ago.  Reports thinks HR was in the 90's. Also, reports thinks symptoms are r/t Xarelto.  Pt continues to express does not feel well overall.  I again advised pt our recommendation is to report to the ED.  Also, advised pt that I will send a message to device clinic to review loop recorder.  Pt thanked me but still refuses to report to the ED. Will also send to pharm d to see if xarelto could be causing symptoms.

## 2021-10-24 NOTE — Telephone Encounter (Signed)
Patient called in stating she has been having leg weakness, stomach pain (from her stomach all the way to her throat and teeth) and bloated just not feeling well for the last several weeks. Pt believes related to Xarelto. Pt states she had similar symptoms of weakness with Eliquis.  Pt states the last several days she is in so much pain she cannot get out of the bed. Pt eating but no appetite. Encouraged pt to seek care in ER if unable to get out of bed.

## 2021-10-24 NOTE — Telephone Encounter (Signed)
Pt c/o of Chest Pain: STAT if CP now or developed within 24 hours  1. Are you having CP right now? Yes  2. Are you experiencing any other symptoms (ex. SOB, nausea, vomiting, sweating)? No  3. How long have you been experiencing CP?    4. Is your CP continuous or coming and going? Continuous  5. Have you taken Nitroglycerin? No ?  Pt states that it feels like her insides are burning. Pt states she has been having chest and stomach pain

## 2021-10-25 NOTE — Telephone Encounter (Signed)
Symptoms should not be coming from her Xarelto. Agree she should go to the ED for chest pain.

## 2021-10-25 NOTE — Telephone Encounter (Signed)
Nightly transmission reviewed. No new episodes. Last tachy and AF event 08/11/21. Advised patient that if she continues to have chest discomfort, general malaise, and stomach pain she needs to present to the ED. Patient states she refuses to go to [facility] or any [facility] facility. She did not take her xarelto last evening and feels better today. She is provided risk of self discontinuation of [place] with history of AF. Offered possible alternative however patient states she had too many side effects with eliquis and she is "not going to take any rat poison!" Prior to end of conversation ED precautions reinforced.

## 2021-10-27 DIAGNOSIS — R1084 Generalized abdominal pain: Secondary | ICD-10-CM | POA: Diagnosis not present

## 2021-10-27 DIAGNOSIS — Z0000 Encounter for general adult medical examination without abnormal findings: Secondary | ICD-10-CM | POA: Diagnosis not present

## 2021-10-27 DIAGNOSIS — I4811 Longstanding persistent atrial fibrillation: Secondary | ICD-10-CM | POA: Diagnosis not present

## 2021-10-27 DIAGNOSIS — G894 Chronic pain syndrome: Secondary | ICD-10-CM | POA: Diagnosis not present

## 2021-10-27 DIAGNOSIS — M810 Age-related osteoporosis without current pathological fracture: Secondary | ICD-10-CM | POA: Diagnosis not present

## 2021-10-27 DIAGNOSIS — I10 Essential (primary) hypertension: Secondary | ICD-10-CM | POA: Diagnosis not present

## 2021-10-28 NOTE — Telephone Encounter (Signed)
Pt reports major reaction to Prolia rxn. Can barely walk right now, taking Prednisone. She reports just having a rough time. Told pt I hope this improves quickly for her.  She does reports she restarted Xarelto. States she did have bleeding gums after brushing, but feels r/t to her current issue. She will let us know if she continues to have bleeding gums and/or other bleeding issues.

## 2021-10-29 ENCOUNTER — Other Ambulatory Visit: Payer: Self-pay

## 2021-10-29 ENCOUNTER — Emergency Department (HOSPITAL_BASED_OUTPATIENT_CLINIC_OR_DEPARTMENT_OTHER): Payer: Medicare Other

## 2021-10-29 ENCOUNTER — Emergency Department (HOSPITAL_BASED_OUTPATIENT_CLINIC_OR_DEPARTMENT_OTHER): Payer: Medicare Other | Admitting: Radiology

## 2021-10-29 ENCOUNTER — Encounter (HOSPITAL_BASED_OUTPATIENT_CLINIC_OR_DEPARTMENT_OTHER): Payer: Self-pay | Admitting: Emergency Medicine

## 2021-10-29 ENCOUNTER — Inpatient Hospital Stay (HOSPITAL_BASED_OUTPATIENT_CLINIC_OR_DEPARTMENT_OTHER)
Admission: EM | Admit: 2021-10-29 | Discharge: 2021-11-01 | DRG: 872 | Disposition: A | Discharge status: Home / Self Care | Payer: Medicare Other | Attending: Family Medicine | Admitting: Family Medicine

## 2021-10-29 DIAGNOSIS — R000 Tachycardia, unspecified: Secondary | ICD-10-CM | POA: Diagnosis not present

## 2021-10-29 DIAGNOSIS — R32 Unspecified urinary incontinence: Secondary | ICD-10-CM | POA: Diagnosis present

## 2021-10-29 DIAGNOSIS — Z880 Allergy status to penicillin: Secondary | ICD-10-CM

## 2021-10-29 DIAGNOSIS — R509 Fever, unspecified: Secondary | ICD-10-CM | POA: Diagnosis not present

## 2021-10-29 DIAGNOSIS — Z881 Allergy status to other antibiotic agents status: Secondary | ICD-10-CM

## 2021-10-29 DIAGNOSIS — K5792 Diverticulitis of intestine, part unspecified, without perforation or abscess without bleeding: Secondary | ICD-10-CM | POA: Diagnosis not present

## 2021-10-29 DIAGNOSIS — K7689 Other specified diseases of liver: Secondary | ICD-10-CM | POA: Diagnosis not present

## 2021-10-29 DIAGNOSIS — R079 Chest pain, unspecified: Secondary | ICD-10-CM | POA: Diagnosis present

## 2021-10-29 DIAGNOSIS — K5732 Diverticulitis of large intestine without perforation or abscess without bleeding: Secondary | ICD-10-CM | POA: Diagnosis not present

## 2021-10-29 DIAGNOSIS — I480 Paroxysmal atrial fibrillation: Secondary | ICD-10-CM | POA: Diagnosis not present

## 2021-10-29 DIAGNOSIS — Z85828 Personal history of other malignant neoplasm of skin: Secondary | ICD-10-CM | POA: Diagnosis not present

## 2021-10-29 DIAGNOSIS — I10 Essential (primary) hypertension: Secondary | ICD-10-CM | POA: Diagnosis not present

## 2021-10-29 DIAGNOSIS — R110 Nausea: Secondary | ICD-10-CM | POA: Diagnosis not present

## 2021-10-29 DIAGNOSIS — A419 Sepsis, unspecified organism: Secondary | ICD-10-CM | POA: Diagnosis not present

## 2021-10-29 DIAGNOSIS — E871 Hypo-osmolality and hyponatremia: Secondary | ICD-10-CM | POA: Diagnosis present

## 2021-10-29 DIAGNOSIS — Z888 Allergy status to other drugs, medicaments and biological substances status: Secondary | ICD-10-CM

## 2021-10-29 DIAGNOSIS — R918 Other nonspecific abnormal finding of lung field: Secondary | ICD-10-CM | POA: Diagnosis not present

## 2021-10-29 DIAGNOSIS — Z7901 Long term (current) use of anticoagulants: Secondary | ICD-10-CM | POA: Diagnosis not present

## 2021-10-29 DIAGNOSIS — Z1152 Encounter for screening for COVID-19: Secondary | ICD-10-CM

## 2021-10-29 DIAGNOSIS — I248 Other forms of acute ischemic heart disease: Secondary | ICD-10-CM | POA: Diagnosis present

## 2021-10-29 DIAGNOSIS — M6281 Muscle weakness (generalized): Secondary | ICD-10-CM | POA: Diagnosis present

## 2021-10-29 DIAGNOSIS — Z882 Allergy status to sulfonamides status: Secondary | ICD-10-CM | POA: Diagnosis not present

## 2021-10-29 DIAGNOSIS — R0602 Shortness of breath: Secondary | ICD-10-CM | POA: Diagnosis not present

## 2021-10-29 DIAGNOSIS — R778 Other specified abnormalities of plasma proteins: Secondary | ICD-10-CM

## 2021-10-29 DIAGNOSIS — J479 Bronchiectasis, uncomplicated: Secondary | ICD-10-CM | POA: Diagnosis present

## 2021-10-29 DIAGNOSIS — R7989 Other specified abnormal findings of blood chemistry: Secondary | ICD-10-CM

## 2021-10-29 DIAGNOSIS — Z79899 Other long term (current) drug therapy: Secondary | ICD-10-CM | POA: Diagnosis not present

## 2021-10-29 DIAGNOSIS — R531 Weakness: Secondary | ICD-10-CM | POA: Diagnosis not present

## 2021-10-29 LAB — URINALYSIS, ROUTINE W REFLEX MICROSCOPIC
Bilirubin Urine: NEGATIVE
Glucose, UA: NEGATIVE mg/dL
Hgb urine dipstick: NEGATIVE
Ketones, ur: NEGATIVE mg/dL
Leukocytes,Ua: NEGATIVE
Nitrite: NEGATIVE
Protein, ur: NEGATIVE mg/dL
Specific Gravity, Urine: 1.009 (ref 1.005–1.030)
pH: 7 (ref 5.0–8.0)

## 2021-10-29 LAB — COMPREHENSIVE METABOLIC PANEL
ALT: 14 U/L (ref 0–44)
AST: 23 U/L (ref 15–41)
Albumin: 3.3 g/dL — ABNORMAL LOW (ref 3.5–5.0)
Alkaline Phosphatase: 60 U/L (ref 38–126)
Anion gap: 9 (ref 5–15)
BUN: 19 mg/dL (ref 8–23)
CO2: 21 mmol/L — ABNORMAL LOW (ref 22–32)
Calcium: 8.4 mg/dL — ABNORMAL LOW (ref 8.9–10.3)
Chloride: 102 mmol/L (ref 98–111)
Creatinine, Ser: 0.66 mg/dL (ref 0.44–1.00)
GFR, Estimated: >60 mL/min (ref >60)
Glucose, Bld: 104 mg/dL — ABNORMAL HIGH (ref 70–99)
Potassium: 4 mmol/L (ref 3.5–5.1)
Sodium: 132 mmol/L — ABNORMAL LOW (ref 135–145)
Total Bilirubin: 0.4 mg/dL (ref 0.3–1.2)
Total Protein: 6.6 g/dL (ref 6.5–8.1)

## 2021-10-29 LAB — CBC WITH DIFFERENTIAL/PLATELET
Abs Immature Granulocytes: 0.09 10*3/uL — ABNORMAL HIGH (ref 0.00–0.07)
Basophils Absolute: 0 10*3/uL (ref 0.0–0.1)
Basophils Relative: 0 %
Eosinophils Absolute: 0 10*3/uL (ref 0.0–0.5)
Eosinophils Relative: 0 %
HCT: 37.8 % (ref 36.0–46.0)
Hemoglobin: 12.7 g/dL (ref 12.0–15.0)
Immature Granulocytes: 1 %
Lymphocytes Relative: 2 %
Lymphs Abs: 0.3 10*3/uL — ABNORMAL LOW (ref 0.7–4.0)
MCH: 30 pg (ref 26.0–34.0)
MCHC: 33.6 g/dL (ref 30.0–36.0)
MCV: 89.2 fL (ref 80.0–100.0)
Monocytes Absolute: 0.6 10*3/uL (ref 0.1–1.0)
Monocytes Relative: 3 %
Neutro Abs: 16.6 10*3/uL — ABNORMAL HIGH (ref 1.7–7.7)
Neutrophils Relative %: 94 %
Platelets: 295 10*3/uL (ref 150–400)
RBC: 4.24 MIL/uL (ref 3.87–5.11)
RDW: 12.3 % (ref 11.5–15.5)
WBC: 17.7 10*3/uL — ABNORMAL HIGH (ref 4.0–10.5)
nRBC: 0 % (ref 0.0–0.2)

## 2021-10-29 LAB — APTT: aPTT: 32 seconds (ref 24–36)

## 2021-10-29 LAB — LACTIC ACID, PLASMA
Lactic Acid, Venous: 1.4 mmol/L (ref 0.5–1.9)
Lactic Acid, Venous: 1 mmol/L (ref 0.5–1.9)

## 2021-10-29 LAB — RESP PANEL BY RT-PCR (FLU A&B, COVID) ARPGX2
Influenza A by PCR: NEGATIVE
Influenza B by PCR: NEGATIVE
SARS Coronavirus 2 by RT PCR: NEGATIVE

## 2021-10-29 LAB — PROTIME-INR
INR: 1 (ref 0.8–1.2)
Prothrombin Time: 12.9 seconds (ref 11.4–15.2)

## 2021-10-29 LAB — TROPONIN I (HIGH SENSITIVITY)
Troponin I (High Sensitivity): 12 ng/L (ref <18)
Troponin I (High Sensitivity): 23 ng/L — ABNORMAL HIGH (ref <18)

## 2021-10-29 LAB — LIPASE, BLOOD: Lipase: 30 U/L (ref 11–51)

## 2021-10-29 MED ORDER — VANCOMYCIN HCL 750 MG/150ML IV SOLN
750.0000 mg | INTRAVENOUS | Status: DC
  Filled 2021-10-29: qty 150

## 2021-10-29 MED ORDER — METRONIDAZOLE 500 MG/100ML IV SOLN
500.0000 mg | Freq: Once | INTRAVENOUS | Status: AC
  Administered 2021-10-29: 500 mg via INTRAVENOUS
  Filled 2021-10-29: qty 100

## 2021-10-29 MED ORDER — SODIUM CHLORIDE 0.9 % IV SOLN: 2.0000 g | Freq: Once | INTRAVENOUS | Status: DC

## 2021-10-29 MED ORDER — ASPIRIN 81 MG PO CHEW
324.0000 mg | CHEWABLE_TABLET | Freq: Once | ORAL | Status: AC
  Administered 2021-10-29: 324 mg via ORAL
  Filled 2021-10-29: qty 4

## 2021-10-29 MED ORDER — VANCOMYCIN HCL IN DEXTROSE 1-5 GM/200ML-% IV SOLN
1000.0000 mg | Freq: Once | INTRAVENOUS | Status: AC
  Administered 2021-10-29: 1000 mg via INTRAVENOUS
  Filled 2021-10-29: qty 200

## 2021-10-29 MED ORDER — SODIUM CHLORIDE 0.9 % IV SOLN
2.0000 g | Freq: Once | INTRAVENOUS | Status: AC
  Administered 2021-10-29: 2 g via INTRAVENOUS
  Filled 2021-10-29: qty 12.5

## 2021-10-29 MED ORDER — IOHEXOL 300 MG/ML  SOLN
100.0000 mL | Freq: Once | INTRAMUSCULAR | Status: AC | PRN
  Administered 2021-10-29: 100 mL via INTRAVENOUS

## 2021-10-29 MED ORDER — SODIUM CHLORIDE 0.9 % IV SOLN
2.0000 g | Freq: Two times a day (BID) | INTRAVENOUS | Status: DC
  Administered 2021-10-30 – 2021-11-01 (×5): 2 g via INTRAVENOUS
  Filled 2021-10-29 (×5): qty 12.5

## 2021-10-29 MED ORDER — LACTATED RINGERS IV BOLUS
500.0000 mL | Freq: Once | INTRAVENOUS | Status: AC
  Administered 2021-10-29: 500 mL via INTRAVENOUS

## 2021-10-29 NOTE — Progress Notes (Signed)
Pharmacy Antibiotic Note  [name] is a 88 y.o. female admitted on 10/29/2021 presenting with SOB, chills, leukocytosis and fever.  Pharmacy has been consulted for vancomycin and cefepime dosing.  Plan: Vancomycin 1000 mg IV x 1, then 750 mg IV q 24h (eAUC 448, Scr used 0.8) Cefepime 2g IV q 12h Monitor renal function, Cx and clinical progression to narrow Vancomycin levels as needed  Height: '5\' 4"'$  (162.6 cm) Weight: 58.2 kg (128 lb 4.9 oz) IBW/kg (Calculated) : 54.7  Temp (24hrs), Avg:100.6 F (38.1 C), Min:98.9 F (37.2 C), Max:102.2 F (39 C)  Recent Labs  Lab 10/29/21 1911  WBC 17.7*  CREATININE 0.66  LATICACIDVEN 1.4    Estimated Creatinine Clearance: 42.8 mL/min (by C-G formula based on SCr of 0.66 mg/dL).    Allergies  Allergen Reactions   Hydralazine Hcl Shortness Of Breath    With throat numbness/tingling and swelling   Ciprofloxacin Other (See Comments)    triggered a migraine that was thought to be a TIA   Penicillins Rash    Has patient had a PCN reaction causing immediate rash, facial/tongue/throat swelling, SOB or lightheadedness with hypotension: Yes Has patient had a PCN reaction causing severe rash involving mucus membranes or skin necrosis: No Has patient had a PCN reaction that required hospitalization: No Has patient had a PCN reaction occurring within the last 10 years: No If all of the above answers are "NO", then may proceed with Cephalosporin use.   Sulfonamide Derivatives Swelling   Apixaban Other (See Comments)    Other reaction(s): weakness   Avelox [Moxifloxacin] Other (See Comments)    Rash and severe tingling/numbness    Band-Aid Infection Defense [Bacitracin-Polymyxin B] Other (See Comments)    unknown   Dome-Paste Bandage [Wound Dressings] Hives   Mannitol    Multaq [Dronedarone]     Muscle aches   Nitrofurantoin Monohyd Macro     Other reaction(s): due to lung disease   Other Other (See Comments)    Water for injection,  sterile - unknown   Prednisone     Other reaction(s): rash   Reclast [Zoledronic Acid] Other (See Comments)    hypertension   Sulfa Antibiotics Other (See Comments)    unknown   Chlorpheniramine Other (See Comments)    Headache, stomach ache, bladder "not working well"    Bertis Ruddy, PharmD Clinical Pharmacist ED Pharmacist Phone # [phone] 10/29/2021 8:24 PM

## 2021-10-29 NOTE — Sepsis Progress Note (Signed)
[facility] following code sepsis °

## 2021-10-29 NOTE — ED Triage Notes (Signed)
Multiple Complaints:  Pt received prolia injection last week, after injection pt began to have weakness in legs. PCP advised pt that the weakness is a side effect from the prolia and gave pt prescription of prednisone. Today pt states she is having tingling in face and SOB that she believes is a reaction from prednisone. Pt son states that pt has always had weakness in legs and reason EMS was called today was for constipation and chills. Pt was afebrile for EMS and in triage.

## 2021-10-29 NOTE — ED Provider Notes (Signed)
[facility] EMERGENCY DEPT Provider Note   CSN: [id number] Arrival date & time: 10/29/21  1806     History  Chief Complaint  Patient presents with   Cough   Constipation    Kannon Baum is a 88 y.o. female.  HPI 88 year old female presents with a chief complaint of shortness of breath.  She got a Prolia injection last week and has had leg weakness ever since.  This is slowly improving since she was prescribed prednisone.  Today is her fourth dose and when taking it this morning she developed tingling to her face and a dry mouth.  Symptoms of the tingling are gone but she still feels short of breath which also started this morning and has progressively worsened.  She has a chronic cough that is unchanged.  She is feeling some chest pressure.  She had some chills today and her son says he gave her Tylenol this afternoon prior to EMS arrival.  Some nausea but no vomiting.  Chronic headache but this is unchanged from baseline.  Also has chronic back pain is unchanged.  Has chronic urinary incontinence but no new urinary symptoms.  Has left-sided abdominal pain has been going on since she had a diverticular abscess months ago but does seem worse over the last 3 days.  Leg weakness seems better prednisone was started  Home Medications Prior to Admission medications   Medication Sig Start Date End Date Taking? Authorizing Provider  acetaminophen (TYLENOL) 500 MG tablet Take 1,000 mg by mouth as needed for fever.    [provider]  Ascorbic Acid (VITAMIN C) 100 MG tablet Take 100 mg by mouth daily. Patient not taking: Reported on 09/07/2021    [provider]  cholecalciferol (VITAMIN D) 25 MCG (1000 UNIT) tablet 1 tablet Patient not taking: Reported on 09/07/2021    [provider]  denosumab (PROLIA) 60 MG/ML SOSY injection Inject 60 mg into the skin every 6 (six) months.    [provider]  diltiazem (CARDIZEM) 30 MG tablet Take 1 tablet (30  mg total) by mouth 4 (four) times daily as needed (for elevated heart rates). 08/22/21   Camnitz, Ocie Doyne, MD  famotidine (PEPCID) 20 MG tablet One after bfast and one after supper 08/01/21   Tanda Rockers, MD  feeding supplement (ENSURE ENLIVE / ENSURE PLUS) LIQD Take 237 mLs by mouth 2 (two) times daily between meals. 10/26/20   Ezekiel Slocumb, DO  folic acid (FOLVITE) 1 MG tablet Take 1 mg by mouth daily.    [provider]  HYDROcodone-acetaminophen (NORCO/VICODIN) 5-325 MG tablet Take 1 tablet by mouth every 4 (four) hours as needed for moderate pain. Patient taking differently: Take 1 tablet by mouth at bedtime. 06/24/18   Tanda Rockers, MD  Multiple Vitamins-Minerals (ONE-A-DAY WOMENS 50 PLUS PO) Take 1 tablet by mouth daily.    [provider]  Rivaroxaban (XARELTO) 15 MG TABS tablet Take 1 tablet (15 mg total) by mouth daily with supper. D/c eliquis 10/03/21   Sherran Needs, NP      Allergies    Hydralazine hcl, Ciprofloxacin, Penicillins, Sulfonamide derivatives, Apixaban, Avelox [moxifloxacin], Band-aid infection defense [bacitracin-polymyxin b], Dome-paste bandage [wound dressings], Mannitol, Multaq [dronedarone], Nitrofurantoin monohyd macro, Other, Prednisone, Reclast [zoledronic acid], Sulfa antibiotics, and Chlorpheniramine    Review of Systems   Review of Systems  Constitutional:  Positive for chills. Negative for fever.  Respiratory:  Positive for cough and shortness of breath.   Cardiovascular:  Positive for chest pain. Negative for leg swelling.  Gastrointestinal:  Positive for abdominal pain, constipation and nausea. Negative for vomiting.  Genitourinary:  Negative for dysuria.  Musculoskeletal:  Positive for back pain (chronic).  Neurological:  Positive for weakness, numbness (tingling - now resolved) and headaches (chronic).   Physical Exam Updated Vital Signs BP (!) 143/69   Pulse 91   Temp 97.7 F (36.5 C) (Axillary)   Resp (!) 23   Ht  '5\' 4"'$  (1.626 m)   Wt 58.2 kg   SpO2 95%   BMI 22.02 kg/m  Physical Exam Vitals and nursing note reviewed.  Constitutional:      Appearance: She is well-developed. She is not ill-appearing or diaphoretic.  HENT:     Head: Normocephalic and atraumatic.  Cardiovascular:     Rate and Rhythm: Regular rhythm. Tachycardia present.     Heart sounds: Normal heart sounds.  Pulmonary:     Effort: Pulmonary effort is normal.     Breath sounds: Normal breath sounds. No wheezing or rales.  Abdominal:     Palpations: Abdomen is soft.     Tenderness: There is abdominal tenderness in the left upper quadrant and left lower quadrant. There is no right CVA tenderness or left CVA tenderness.  Musculoskeletal:     Right lower leg: No edema.     Left lower leg: No edema.  Skin:    General: Skin is warm and dry.  Neurological:     Mental Status: She is alert.    ED Results / Procedures / Treatments   Labs (all labs ordered are listed, but only abnormal results are displayed) Labs Reviewed  COMPREHENSIVE METABOLIC PANEL - Abnormal; Notable for the following components:      Result Value   Sodium 132 (*)    CO2 21 (*)    Glucose, Bld 104 (*)    Calcium 8.4 (*)    Albumin 3.3 (*)    All other components within normal limits  CBC WITH DIFFERENTIAL/PLATELET - Abnormal; Notable for the following components:   WBC 17.7 (*)    Neutro Abs 16.6 (*)    Lymphs Abs 0.3 (*)    Abs Immature Granulocytes 0.09 (*)    All other components within normal limits  URINALYSIS, ROUTINE W REFLEX MICROSCOPIC - Abnormal; Notable for the following components:   Color, Urine COLORLESS (*)    All other components within normal limits  TROPONIN I (HIGH SENSITIVITY) - Abnormal; Notable for the following components:   Troponin I (High Sensitivity) 23 (*)    All other components within normal limits  RESP PANEL BY RT-PCR (FLU A&B, COVID) ARPGX2  CULTURE, BLOOD (ROUTINE X 2)  CULTURE, BLOOD (ROUTINE X 2)  URINE CULTURE   LACTIC ACID, PLASMA  LACTIC ACID, PLASMA  PROTIME-INR  APTT  LIPASE, BLOOD  TROPONIN I (HIGH SENSITIVITY)    EKG EKG Interpretation  Date/Time:  Saturday Oct 29 2021 19:44:53 EDT Ventricular Rate:  97 PR Interval:  195 QRS Duration: 91 QT Interval:  341 QTC Calculation: 434 R Axis:   88 Text Interpretation: Sinus rhythm Borderline right axis deviation Low voltage, precordial leads Confirmed by Sherwood Gambler [phone]) on 10/29/2021 8:00:42 PM  Radiology DG Chest 2 View  Result Date: 10/29/2021 CLINICAL DATA:  Dyspnea EXAM: CHEST - 2 VIEW COMPARISON:  CT chest dated 08/09/2021 FINDINGS: Chronic peribronchovascular nodular opacities in the upper lobes, right greater than left, corresponding to chronic mycobacterial infection on prior CT. No focal consolidation. No pleural  effusion or pneumothorax. The heart is normal in size. Visualized osseous structures are within normal limits. IMPRESSION: No evidence of acute cardiopulmonary disease. 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 Electronically Signed   By: Julian Hy M.D.   On: 10/29/2021 20:39   CT ABDOMEN PELVIS W CONTRAST  Result Date: 10/29/2021 CLINICAL DATA:  Diverticulitis EXAM: CT ABDOMEN AND PELVIS WITH CONTRAST TECHNIQUE: Multidetector CT imaging of the abdomen and pelvis was performed using the standard protocol following bolus administration of intravenous contrast. RADIATION DOSE REDUCTION: This exam was performed according to the departmental dose-optimization program which includes automated exposure control, adjustment of the mA and/or kV according to patient size and/or use of iterative reconstruction technique. CONTRAST:  126m OMNIPAQUE IOHEXOL 300 MG/ML  SOLN COMPARISON:  04/25/2021 FINDINGS: Lower chest: Stable 8 mm nodule in the medial right lower lobe (series 4/image 3), reflecting mucous plugging within a terminal bronchiole. Focal fat/altered perfusion along the falciform ligament.  Hepatobiliary: 12 mm central right hepatic lobe cyst (series 2/image 11). Gallbladder is unremarkable. No intrahepatic or extrahepatic duct dilatation. Pancreas: Within normal limits. Spleen: Within normal limits. Adrenals/Urinary Tract: Adrenal glands are within normal limits. Mild bilateral renal cortical scarring.  No hydronephrosis. Mildly distended bladder. Stomach/Bowel: Stomach is within normal limits. No evidence of bowel obstruction. Appendix is not discretely visualized. Extensive sigmoid diverticulosis with mild diverticulitis in the left lower quadrant (series 2/image 87). No drainable fluid collection/abscess. No free air to suggest macroscopic perforation. Overall appearance is improved from the prior. Vascular/Lymphatic: No evidence of abdominal aortic aneurysm. Atherosclerotic calcifications of the abdominal aorta and branch vessels. No suspicious abdominopelvic lymphadenopathy. Reproductive: Uterus is within normal limits.  No adnexal masses. Other: No abdominopelvic ascites. Musculoskeletal: Mild degenerative changes of the lumbar spine. IMPRESSION: Mild sigmoid diverticulitis, improved from the prior. No drainable fluid collection/abscess. No free air to suggest macroscopic perforation. Electronically Signed   By: SJulian HyM.D.   On: 10/29/2021 20:52    Procedures .Critical Care Performed by: GSherwood Gambler MD Authorized by: GSherwood Gambler MD   Critical care provider statement:    Critical care time (minutes):  30   Critical care time was exclusive of:  Separately billable procedures and treating other patients   Critical care was necessary to treat or prevent imminent or life-threatening deterioration of the following conditions:  Sepsis   Critical care was time spent personally by me on the following activities:  Development of treatment plan with patient or surrogate, discussions with consultants, evaluation of patient's response to treatment, examination of patient,  ordering and review of laboratory studies, ordering and review of radiographic studies, ordering and performing treatments and interventions, pulse oximetry, re-evaluation of patient's condition and review of old charts    Medications Ordered in ED Medications  vancomycin (VANCOREADY) IVPB 750 mg/150 mL (has no administration in time range)  ceFEPIme (MAXIPIME) 2 g in sodium chloride 0.9 % 100 mL IVPB (has no administration in time range)  lactated ringers bolus 500 mL (0 mLs Intravenous Stopped 10/29/21 1958)  metroNIDAZOLE (FLAGYL) IVPB 500 mg (0 mg Intravenous Stopped 10/29/21 2142)  vancomycin (VANCOCIN) IVPB 1000 mg/200 mL premix (1,000 mg Intravenous New Bag/Given 10/29/21 2059)  ceFEPIme (MAXIPIME) 2 g in sodium chloride 0.9 % 100 mL IVPB (0 g Intravenous Stopped 10/29/21 2110)  iohexol (OMNIPAQUE) 300 MG/ML solution 100 mL (100 mLs Intravenous Contrast Given 10/29/21 2032)    ED Course/ Medical Decision Making/ A&P  Medical Decision Making Amount and/or Complexity of Data Reviewed External Data Reviewed: radiology and notes. Labs: ordered. Radiology: ordered and independent interpretation performed. ECG/medicine tests: ordered and independent interpretation performed.  Risk OTC drugs. Prescription drug management. Decision regarding hospitalization.   Patient is found to have a rectal temperature of 102.2 here.  Given this sepsis protocol started.  With her white count of 17 and the fever and initial tachycardia, I decided to broadly treat with IV antibiotics.  Seems that the work-up is pointing towards diverticulitis which she has had before.  She had an abscess before but I do not see this on CT imaging today.  I personally reviewed/interpreted the CT images which do show the diverticulitis.  Otherwise, her lab work is notable for a troponin that went from 12 to 64.  She is complaining of some chest pressure.  However her ECG does not show acute ischemia.   I have discussed with cardiology, Dr. Chancy Milroy. He advises this is likely all secondary to the infection and advises echo but otherwise no heparin or emergent cardiology consult unless the echo is abnormal.  I discussed her case with Dr. Hal Hope for admission.        Final Clinical Impression(s) / ED Diagnoses Final diagnoses:  Acute diverticulitis    Rx / DC Orders ED Discharge Orders     None         Sherwood Gambler, MD 10/29/21 2352

## 2021-10-30 ENCOUNTER — Encounter (HOSPITAL_COMMUNITY): Payer: Self-pay | Admitting: Internal Medicine

## 2021-10-30 ENCOUNTER — Inpatient Hospital Stay (HOSPITAL_COMMUNITY): Payer: Medicare Other

## 2021-10-30 DIAGNOSIS — Z1152 Encounter for screening for COVID-19: Secondary | ICD-10-CM | POA: Diagnosis not present

## 2021-10-30 DIAGNOSIS — Z882 Allergy status to sulfonamides status: Secondary | ICD-10-CM | POA: Diagnosis not present

## 2021-10-30 DIAGNOSIS — J479 Bronchiectasis, uncomplicated: Secondary | ICD-10-CM | POA: Diagnosis present

## 2021-10-30 DIAGNOSIS — R778 Other specified abnormalities of plasma proteins: Secondary | ICD-10-CM

## 2021-10-30 DIAGNOSIS — I10 Essential (primary) hypertension: Secondary | ICD-10-CM | POA: Diagnosis present

## 2021-10-30 DIAGNOSIS — Z888 Allergy status to other drugs, medicaments and biological substances status: Secondary | ICD-10-CM | POA: Diagnosis not present

## 2021-10-30 DIAGNOSIS — I480 Paroxysmal atrial fibrillation: Secondary | ICD-10-CM | POA: Diagnosis present

## 2021-10-30 DIAGNOSIS — R7989 Other specified abnormal findings of blood chemistry: Secondary | ICD-10-CM

## 2021-10-30 DIAGNOSIS — R079 Chest pain, unspecified: Secondary | ICD-10-CM

## 2021-10-30 DIAGNOSIS — K5732 Diverticulitis of large intestine without perforation or abscess without bleeding: Secondary | ICD-10-CM | POA: Diagnosis present

## 2021-10-30 DIAGNOSIS — A419 Sepsis, unspecified organism: Secondary | ICD-10-CM | POA: Diagnosis present

## 2021-10-30 DIAGNOSIS — Z79899 Other long term (current) drug therapy: Secondary | ICD-10-CM | POA: Diagnosis not present

## 2021-10-30 DIAGNOSIS — Z880 Allergy status to penicillin: Secondary | ICD-10-CM | POA: Diagnosis not present

## 2021-10-30 DIAGNOSIS — M6281 Muscle weakness (generalized): Secondary | ICD-10-CM | POA: Diagnosis present

## 2021-10-30 DIAGNOSIS — Z85828 Personal history of other malignant neoplasm of skin: Secondary | ICD-10-CM | POA: Diagnosis not present

## 2021-10-30 DIAGNOSIS — E871 Hypo-osmolality and hyponatremia: Secondary | ICD-10-CM | POA: Diagnosis present

## 2021-10-30 DIAGNOSIS — I248 Other forms of acute ischemic heart disease: Secondary | ICD-10-CM | POA: Diagnosis present

## 2021-10-30 DIAGNOSIS — K5792 Diverticulitis of intestine, part unspecified, without perforation or abscess without bleeding: Secondary | ICD-10-CM

## 2021-10-30 DIAGNOSIS — R32 Unspecified urinary incontinence: Secondary | ICD-10-CM | POA: Diagnosis present

## 2021-10-30 DIAGNOSIS — Z7901 Long term (current) use of anticoagulants: Secondary | ICD-10-CM | POA: Diagnosis not present

## 2021-10-30 DIAGNOSIS — Z881 Allergy status to other antibiotic agents status: Secondary | ICD-10-CM | POA: Diagnosis not present

## 2021-10-30 LAB — ECHOCARDIOGRAM COMPLETE
AR max vel: 2.56 cm2
AV Area VTI: 2.42 cm2
AV Area mean vel: 2.29 cm2
AV Mean grad: 4 mmHg
AV Peak grad: 6.5 mmHg
Ao pk vel: 1.27 m/s
Area-P 1/2: 4.39 cm2
Height: 64 in
P 1/2 time: 342 msec
S' Lateral: 2.4 cm
Weight: 2052.92 oz

## 2021-10-30 LAB — TROPONIN I (HIGH SENSITIVITY): Troponin I (High Sensitivity): 16 ng/L (ref <18)

## 2021-10-30 MED ORDER — RIVAROXABAN 15 MG PO TABS
15.0000 mg | ORAL_TABLET | Freq: Every day | ORAL | Status: DC
  Administered 2021-10-30 – 2021-10-31 (×3): 15 mg via ORAL
  Filled 2021-10-30 (×3): qty 1

## 2021-10-30 MED ORDER — HYDROCODONE-ACETAMINOPHEN 5-325 MG PO TABS
1.0000 | ORAL_TABLET | Freq: Four times a day (QID) | ORAL | Status: DC | PRN
  Administered 2021-10-30: 2 via ORAL
  Administered 2021-10-31: 1 via ORAL
  Filled 2021-10-30 (×2): qty 2

## 2021-10-30 MED ORDER — HYDROCODONE-ACETAMINOPHEN 5-325 MG PO TABS
1.0000 | ORAL_TABLET | Freq: Once | ORAL | Status: AC
  Administered 2021-10-30: 1 via ORAL
  Filled 2021-10-30: qty 1

## 2021-10-30 MED ORDER — ACETAMINOPHEN 650 MG RE SUPP: 650.0000 mg | Freq: Four times a day (QID) | RECTAL | Status: DC | PRN

## 2021-10-30 MED ORDER — ACETAMINOPHEN 325 MG PO TABS
650.0000 mg | ORAL_TABLET | Freq: Four times a day (QID) | ORAL | Status: DC | PRN
  Administered 2021-10-30 – 2021-10-31 (×2): 650 mg via ORAL
  Filled 2021-10-30 (×2): qty 2

## 2021-10-30 MED ORDER — ONDANSETRON HCL 4 MG/2ML IJ SOLN
4.0000 mg | Freq: Four times a day (QID) | INTRAMUSCULAR | Status: DC | PRN
  Administered 2021-10-30: 4 mg via INTRAVENOUS
  Filled 2021-10-30: qty 2

## 2021-10-30 MED ORDER — METRONIDAZOLE 500 MG/100ML IV SOLN
500.0000 mg | Freq: Two times a day (BID) | INTRAVENOUS | Status: DC
  Administered 2021-10-30 – 2021-11-01 (×5): 500 mg via INTRAVENOUS
  Filled 2021-10-30 (×5): qty 100

## 2021-10-30 MED ORDER — SODIUM CHLORIDE 0.9 % IV SOLN: INTRAVENOUS | Status: AC

## 2021-10-30 MED ORDER — FAMOTIDINE 20 MG PO TABS
20.0000 mg | ORAL_TABLET | Freq: Every day | ORAL | Status: DC
  Administered 2021-10-30 – 2021-11-01 (×3): 20 mg via ORAL
  Filled 2021-10-30 (×3): qty 1

## 2021-10-30 MED ORDER — DILTIAZEM HCL 60 MG PO TABS
60.0000 mg | ORAL_TABLET | Freq: Two times a day (BID) | ORAL | Status: DC
  Administered 2021-10-31: 60 mg via ORAL
  Filled 2021-10-30 (×4): qty 1

## 2021-10-30 MED ORDER — OXYCODONE HCL 5 MG PO TABS: 5.0000 mg | ORAL_TABLET | Freq: Four times a day (QID) | ORAL | Status: DC | PRN

## 2021-10-30 MED ORDER — HYDROMORPHONE HCL 1 MG/ML IJ SOLN: 0.5000 mg | INTRAMUSCULAR | Status: DC | PRN

## 2021-10-30 NOTE — Assessment & Plan Note (Signed)
--  trivial, ACS ruled out

## 2021-10-30 NOTE — ED Notes (Signed)
Pt given night time pain meds that she takes at home, pt drinking water and eating graham crackers, denies other needs at this time.

## 2021-10-30 NOTE — Progress Notes (Signed)
  Echocardiogram 2D Echocardiogram has been performed.  Merrie Roof F 10/30/2021, 10:14 AM

## 2021-10-30 NOTE — Hospital Course (Addendum)
88 year old woman PMH including atrial fibrillation on Xarelto presented with chest pressure and abdominal pain, found to be febrile.  Admitted for acute diverticulitis. Slowly improving; likely home 5/30.

## 2021-10-30 NOTE — Assessment & Plan Note (Addendum)
--  atypical, secondary to chronic cough per son, troponins flat, EKG nonacute, echo WNL, no further evaluation

## 2021-10-30 NOTE — Progress Notes (Addendum)
There is order for 2nd PIV access, but reviewed the MAR, no need PIV access at this time due to no test required 2nd PIV access neither. Notified Dr. Sarajane Jews this matter and Dr. Sarajane Jews verified that patient doesn't need 2nd PIV access. Informed patient's RN regarding this matter. She will consult IV team if patient need it. HS [employer]

## 2021-10-30 NOTE — Assessment & Plan Note (Signed)
stable °

## 2021-10-30 NOTE — ED Notes (Signed)
Report received from [facility]

## 2021-10-30 NOTE — ED Notes (Signed)
Report to [facility].  

## 2021-10-30 NOTE — Progress Notes (Signed)
Pharmacy Antibiotic Note   ABX changed to cefepime + Flagyl for diverticulitis.  Cefepime 2g IV Q12H remains appropriate for intra-abdominal infection.  Wynona Neat, PharmD, BCPS 10/30/2021 5:47 AM

## 2021-10-30 NOTE — H&P (Signed)
History and Physical    [name] SJG:[id number] DOB: Apr 26, 1933 DOA: 10/29/2021  PCP: Orpah Melter, MD  Patient coming from: [facility] ED  Chief Complaint: Multiple complaints  HPI: [name] is a 88 y.o. female with medical history significant of paroxysmal A-fib on Xarelto, chronic bronchitis, hypertension, history of skin cancer presented to the ED complaining of shortness of breath.  She received Prolia injection last week after which she developed weakness in her legs which improved after she was prescribed prednisone by PCP but started having facial tingling and dry mouth with prednisone.  Also complained of chest pressure and abdominal pain.  In the ED, patient febrile with temperature 102.2 F and slightly tachycardic on arrival.  Labs showing WBC 17.7, hemoglobin 12.7, platelet count 295k.  Sodium 132, potassium 4.0, chloride 102, bicarb 21, BUN 19, creatinine 0.6, glucose 104.  Lipase and LFTs normal.  Lactic acid normal x2.  INR 1.0.  UA without signs of infection.  Blood cultures drawn.  High-sensitivity troponin 12 > 23.  EKG without acute ischemic changes.  COVID and influenza PCR negative.  Chest x-ray showing chronic atypical mycobacterial infection, no acute finding.  CT abdomen pelvis showing mild sigmoid diverticulitis; no abscess or macroscopic perforation. Patient was given aspirin 324 mg, Norco, vancomycin, cefepime, Flagyl, and 500 cc LR bolus.  ED physician discussed with on-call cardiologist who felt that mild elevation of troponin was likely from demand ischemia and recommended obtaining echocardiogram but otherwise no heparin or emergent cardiology consult unless the echo is abnormal.  Patient states she received Prolia on May 15 and the following day started having severe pain in both of her legs along with weakness.  States she was seen by her PCP 3 days ago and was told her symptoms were due to side effect of Prolia and was prescribed prednisone after which her  symptoms improved significantly.  However, after she took the fourth dose of prednisone she started having tingling all over her head, face, mouth, and tongue.  Tingling has now resolved.  No focal weakness or paresthesias at this time.  Also reports substernal chest pressure which she describes as an elephant sitting on her chest that has been going on since after she received Prolia on May 15 along with ongoing left-sided abdominal pain.  No nausea or vomiting.  Review of Systems:  Review of Systems  All other systems reviewed and are negative.  Past Medical History:  Diagnosis Date   Arm numbness left    Back pain    BCC (basal cell carcinoma of skin) 03/16/1986   Left Shoulder (curet and excision)   BCC (basal cell carcinoma of skin) 03/10/1988   Mid Back (tx p bx)   BCC (basal cell carcinoma of skin) 01/28/1998   Upper Lip (curet and excision)   BCC (basal cell carcinoma of skin) 10/24/2005   Right Mid Back (curet and 5FU)   Bronchitis, chronic (HCC)    Depression    Dizziness    Fatigue    Hypertension    mild   Palpitations    SCCA (squamous cell carcinoma) of skin 05/08/2011   Left Hand (in situ) (tx p bx)   Skin cancer    Stress    Superficial basal cell carcinoma (BCC) 07/11/1995   Top Right Upper Leg (curet and 5FU)   Superficial basal cell carcinoma (BCC) 01/28/1998   Right Back, Upper Thigh (curet and cautery)   Superficial basal cell carcinoma (BCC) 02/18/2002   Upper Inner Right Leg (  curet and cautery)   Superficial basal cell carcinoma (BCC) 02/18/2002   Lower Abdomen (curet and 5FU)   Superficial basal cell carcinoma (BCC) 01/27/2013   Right Inner Thigh (tx p bx)   Syncope    History of     Past Surgical History:  Procedure Laterality Date   APPENDECTOMY     CESAREAN SECTION  [phone]     reports that she has never smoked. She has never used smokeless tobacco. She reports current alcohol use. She reports that she does not use drugs.  Allergies   Allergen Reactions   Hydralazine Hcl Shortness Of Breath    With throat numbness/tingling and swelling   Ciprofloxacin Other (See Comments)    triggered a migraine that was thought to be a TIA   Penicillins Rash    Has patient had a PCN reaction causing immediate rash, facial/tongue/throat swelling, SOB or lightheadedness with hypotension: Yes Has patient had a PCN reaction causing severe rash involving mucus membranes or skin necrosis: No Has patient had a PCN reaction that required hospitalization: No Has patient had a PCN reaction occurring within the last 10 years: No If all of the above answers are "NO", then may proceed with Cephalosporin use.   Sulfonamide Derivatives Swelling   Apixaban Other (See Comments)    Other reaction(s): weakness   Avelox [Moxifloxacin] Other (See Comments)    Rash and severe tingling/numbness    Band-Aid Infection Defense [Bacitracin-Polymyxin B] Other (See Comments)    unknown   Dome-Paste Bandage [Wound Dressings] Hives   Mannitol    Multaq [Dronedarone]     Muscle aches   Nitrofurantoin Monohyd Macro     Other reaction(s): due to lung disease   Other Other (See Comments)    Water for injection, sterile - unknown   Prednisone     Other reaction(s): rash   Reclast [Zoledronic Acid] Other (See Comments)    hypertension   Sulfa Antibiotics Other (See Comments)    unknown   Chlorpheniramine Other (See Comments)    Headache, stomach ache, bladder "not working well"    Family History  Problem Relation Age of Onset   Atrial fibrillation Mother    Thyroid disease Mother    Alcohol abuse Father     Prior to Admission medications   Medication Sig Start Date End Date Taking? Authorizing Provider  acetaminophen (TYLENOL) 500 MG tablet Take 1,000 mg by mouth as needed for fever.    [provider]  Ascorbic Acid (VITAMIN C) 100 MG tablet Take 100 mg by mouth daily. Patient not taking: Reported on 09/07/2021    [provider]   cholecalciferol (VITAMIN D) 25 MCG (1000 UNIT) tablet 1 tablet Patient not taking: Reported on 09/07/2021    [provider]  denosumab (PROLIA) 60 MG/ML SOSY injection Inject 60 mg into the skin every 6 (six) months.    [provider]  diltiazem (CARDIZEM) 30 MG tablet Take 1 tablet (30 mg total) by mouth 4 (four) times daily as needed (for elevated heart rates). 08/22/21   Camnitz, Ocie Doyne, MD  famotidine (PEPCID) 20 MG tablet One after bfast and one after supper 08/01/21   Tanda Rockers, MD  feeding supplement (ENSURE ENLIVE / ENSURE PLUS) LIQD Take 237 mLs by mouth 2 (two) times daily between meals. 10/26/20   Ezekiel Slocumb, DO  folic acid (FOLVITE) 1 MG tablet Take 1 mg by mouth daily.    [provider]  HYDROcodone-acetaminophen (NORCO/VICODIN) 5-325 MG tablet  Take 1 tablet by mouth every 4 (four) hours as needed for moderate pain. Patient taking differently: Take 1 tablet by mouth at bedtime. 06/24/18   Tanda Rockers, MD  Multiple Vitamins-Minerals (ONE-A-DAY WOMENS 50 PLUS PO) Take 1 tablet by mouth daily.    [provider]  Rivaroxaban (XARELTO) 15 MG TABS tablet Take 1 tablet (15 mg total) by mouth daily with supper. D/c eliquis 10/03/21   Sherran Needs, NP    Physical Exam: Vitals:   10/29/21 2230 10/30/21 0236 10/30/21 0345 10/30/21 0428  BP: (!) 154/67 (!) 160/69  (!) 155/75  Pulse: 83 75 76 85  Resp: (!) '22 17 17 16  '$ Temp:    97.8 F (36.6 C)  TempSrc:    Oral  SpO2: 97% 98% 97% 100%  Weight:      Height:        Physical Exam Vitals reviewed.  Constitutional:      General: She is not in acute distress. HENT:     Head: Normocephalic and atraumatic.  Eyes:     Extraocular Movements: Extraocular movements intact.     Conjunctiva/sclera: Conjunctivae normal.  Cardiovascular:     Rate and Rhythm: Normal rate and regular rhythm.     Pulses: Normal pulses.  Pulmonary:     Effort: Pulmonary effort is normal. No respiratory  distress.     Breath sounds: Normal breath sounds. No wheezing or rales.  Abdominal:     General: Bowel sounds are normal. There is no distension.     Palpations: Abdomen is soft.     Tenderness: There is abdominal tenderness. There is guarding. There is no rebound.     Comments: Generalized tenderness to palpation with guarding  Musculoskeletal:        General: No swelling or tenderness.     Cervical back: Normal range of motion.  Skin:    General: Skin is warm and dry.  Neurological:     General: No focal deficit present.     Mental Status: She is alert and oriented to person, place, and time.     Cranial Nerves: No cranial nerve deficit.     Sensory: No sensory deficit.     Motor: No weakness.     Comments: No focal neurodeficit     Labs on Admission: I have personally reviewed following labs and imaging studies  CBC: Recent Labs  Lab 10/29/21 1911  WBC 17.7*  NEUTROABS 16.6*  HGB 12.7  HCT 37.8  MCV 89.2  PLT 710   Basic Metabolic Panel: Recent Labs  Lab 10/29/21 1911  NA 132*  K 4.0  CL 102  CO2 21*  GLUCOSE 104*  BUN 19  CREATININE 0.66  CALCIUM 8.4*   GFR: Estimated Creatinine Clearance: 42.8 mL/min (by C-G formula based on SCr of 0.66 mg/dL). Liver Function Tests: Recent Labs  Lab 10/29/21 1911  AST 23  ALT 14  ALKPHOS 60  BILITOT 0.4  PROT 6.6  ALBUMIN 3.3*   Recent Labs  Lab 10/29/21 1911  LIPASE 30   No results for input(s): AMMONIA in the last 168 hours. Coagulation Profile: Recent Labs  Lab 10/29/21 1911  INR 1.0   Cardiac Enzymes: No results for input(s): CKTOTAL, CKMB, CKMBINDEX, TROPONINI in the last 168 hours. BNP (last 3 results) No results for input(s): PROBNP in the last 8760 hours. HbA1C: No results for input(s): HGBA1C in the last 72 hours. CBG: No results for input(s): GLUCAP in the last 168 hours. Lipid  Profile: No results for input(s): CHOL, HDL, LDLCALC, TRIG, CHOLHDL, LDLDIRECT in the last 72 hours. Thyroid  Function Tests: No results for input(s): TSH, T4TOTAL, FREET4, T3FREE, THYROIDAB in the last 72 hours. Anemia Panel: No results for input(s): VITAMINB12, FOLATE, FERRITIN, TIBC, IRON, RETICCTPCT in the last 72 hours. Urine analysis:    Component Value Date/Time   COLORURINE COLORLESS (A) 10/29/2021 1911   APPEARANCEUR CLEAR 10/29/2021 1911   LABSPEC 1.009 10/29/2021 1911   PHURINE 7.0 10/29/2021 1911   GLUCOSEU NEGATIVE 10/29/2021 1911   HGBUR NEGATIVE 10/29/2021 1911   BILIRUBINUR NEGATIVE 10/29/2021 1911   KETONESUR NEGATIVE 10/29/2021 1911   PROTEINUR NEGATIVE 10/29/2021 1911   UROBILINOGEN 0.2 01/09/2011 0159   NITRITE NEGATIVE 10/29/2021 1911   LEUKOCYTESUR NEGATIVE 10/29/2021 1911    Radiological Exams on Admission: I have personally reviewed images DG Chest 2 View  Result Date: 10/29/2021 CLINICAL DATA:  Dyspnea EXAM: CHEST - 2 VIEW COMPARISON:  CT chest dated 08/09/2021 FINDINGS: Chronic peribronchovascular nodular opacities in the upper lobes, right greater than left, corresponding to chronic mycobacterial infection on prior CT. No focal consolidation. No pleural effusion or pneumothorax. The heart is normal in size. Visualized osseous structures are within normal limits. IMPRESSION: No evidence of acute cardiopulmonary disease. 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 Electronically Signed   By: Julian Hy M.D.   On: 10/29/2021 20:39   CT ABDOMEN PELVIS W CONTRAST  Result Date: 10/29/2021 CLINICAL DATA:  Diverticulitis EXAM: CT ABDOMEN AND PELVIS WITH CONTRAST TECHNIQUE: Multidetector CT imaging of the abdomen and pelvis was performed using the standard protocol following bolus administration of intravenous contrast. RADIATION DOSE REDUCTION: This exam was performed according to the departmental dose-optimization program which includes automated exposure control, adjustment of the mA and/or kV according to patient size and/or use of iterative  reconstruction technique. CONTRAST:  159m OMNIPAQUE IOHEXOL 300 MG/ML  SOLN COMPARISON:  04/25/2021 FINDINGS: Lower chest: Stable 8 mm nodule in the medial right lower lobe (series 4/image 3), reflecting mucous plugging within a terminal bronchiole. Focal fat/altered perfusion along the falciform ligament. Hepatobiliary: 12 mm central right hepatic lobe cyst (series 2/image 11). Gallbladder is unremarkable. No intrahepatic or extrahepatic duct dilatation. Pancreas: Within normal limits. Spleen: Within normal limits. Adrenals/Urinary Tract: Adrenal glands are within normal limits. Mild bilateral renal cortical scarring.  No hydronephrosis. Mildly distended bladder. Stomach/Bowel: Stomach is within normal limits. No evidence of bowel obstruction. Appendix is not discretely visualized. Extensive sigmoid diverticulosis with mild diverticulitis in the left lower quadrant (series 2/image 87). No drainable fluid collection/abscess. No free air to suggest macroscopic perforation. Overall appearance is improved from the prior. Vascular/Lymphatic: No evidence of abdominal aortic aneurysm. Atherosclerotic calcifications of the abdominal aorta and branch vessels. No suspicious abdominopelvic lymphadenopathy. Reproductive: Uterus is within normal limits.  No adnexal masses. Other: No abdominopelvic ascites. Musculoskeletal: Mild degenerative changes of the lumbar spine. IMPRESSION: Mild sigmoid diverticulitis, improved from the prior. No drainable fluid collection/abscess. No free air to suggest macroscopic perforation. Electronically Signed   By: SJulian HyM.D.   On: 10/29/2021 20:52    EKG: Independently reviewed.  Sinus rhythm, no acute ischemic changes.  Assessment and Plan  Sepsis secondary to acute diverticulitis Febrile and tachycardic on arrival to the ED.  WBC 17.7.  CT showing mild sigmoid diverticulitis; no abscess or macroscopic perforation.  No lactic acidosis or hypotension.  Tachycardia has  resolved. -Cefepime and Flagyl -Continue IV fluid hydration -Tylenol as needed for fevers -Blood cultures pending -Monitor  WBC count  Chest pain and elevated troponin Chest pain appears atypical, ongoing for close to 2 weeks.  High-sensitivity troponin 12 > 23 >16.  EKG without acute ischemic changes.  Cardiology feels mild troponin elevation was from demand ischemia, recommended obtaining echocardiogram. -Echocardiogram  Mild hyponatremia -IV fluid hydration -Continue to monitor  Paroxysmal A-fib Currently in sinus rhythm. -Continue home meds after pharmacy med rec is done.  Hypertension Systolic currently in the 150s. -Pharmacy med rec pending.  DVT prophylaxis:  Continue Xarelto after pharmacy med rec is done. Code Status: Full Code (discussed with the patient) Family Communication: No family available at this time. Level of care: Telemetry bed Admission status: It is my clinical opinion that referral for OBSERVATION is reasonable and necessary in this patient based on the above information provided. The aforementioned taken together are felt to place the patient at high risk for further clinical deterioration. However, it is anticipated that the patient may be medically stable for discharge from the hospital within 24 to 48 hours.   Shela Leff MD [facility]  If 7PM-7AM, please contact night-coverage [email or web address]  10/30/2021, 5:02 AM

## 2021-10-30 NOTE — Assessment & Plan Note (Addendum)
--  secondary to acute diverticulitis; BC pending --hemodynamics stable; no recurrent fever now 48 hours; WBC has normalized

## 2021-10-30 NOTE — Assessment & Plan Note (Addendum)
--  continue diltiazem and Xarelto

## 2021-10-30 NOTE — Assessment & Plan Note (Signed)
--  with fever, leukocytosis, ongoing pain but no n/v; passing gas --continue empiric abx, follow-up culture data

## 2021-10-30 NOTE — Progress Notes (Signed)
  Progress Note   Patient: [name] KCL:[id number] DOB: [birth year] DOA: 10/29/2021     0 DOS: the patient was seen and examined on 10/30/2021   Brief hospital course: 88 year old woman PMH including atrial fibrillation on Xarelto presented with chest pressure and abdominal pain, found to be febrile.  Admitted for acute diverticulitis.  Assessment and Plan: * Sepsis ([place]) --secondary to acute diverticulitis; BC pending --hemodynamics stable; no recurrent fever demonstrated  Acute diverticulitis --with fever, leukocytosis, ongoing pain but no n/v; passing gas --continue empiric abx, follow-up culture data  Elevated troponin --trivial, ACS ruled out  Chest pain --atypical, secondary to chronic cough per son, troponins flat, EKG nonacute, echo WNL  AF (paroxysmal atrial fibrillation) ([place]) --continue diltiazem and Xarelto  Obstructive bronchiectasis (HCC) --stable        Subjective:  Feels about the same Continues to have significant lower abd pain Feeling chilled again  Physical Exam: Vitals:   10/30/21 0236 10/30/21 0345 10/30/21 0428 10/30/21 1143  BP: (!) 160/69  (!) 155/75 (!) 125/57  Pulse: 75 76 85   Resp: '17 17 16   '$ Temp:   97.8 F (36.6 C)   TempSrc:   Oral   SpO2: 98% 97% 100%   Weight:      Height:       Physical Exam Vitals reviewed.  Constitutional:      General: She is not in acute distress.    Appearance: She is not ill-appearing or toxic-appearing.  Cardiovascular:     Rate and Rhythm: Normal rate and regular rhythm.     Heart sounds: No murmur heard. Pulmonary:     Effort: No respiratory distress.     Breath sounds: No wheezing, rhonchi or rales.  Abdominal:     General: There is no distension.     Palpations: Abdomen is soft.     Tenderness: There is abdominal tenderness (mid and lower, moderate).  Neurological:     Mental Status: She is alert.  Psychiatric:        Mood and Affect: Mood normal.        Behavior: Behavior normal.     Data Reviewed:  CMP noted on admission Troponins flat LA WNL WBC 17.7 on admission  Family Communication: son at bedside  Disposition: Status is: Inpatient Remains inpatient appropriate because: acute diverticulitis, uncontrolled pain, fever  Planned Discharge Destination: Home    Time spent: 25 minutes  Author: Murray Hodgkins, MD 10/30/2021 4:36 PM  For on call review [email or web address].

## 2021-10-31 DIAGNOSIS — K5792 Diverticulitis of intestine, part unspecified, without perforation or abscess without bleeding: Secondary | ICD-10-CM | POA: Diagnosis not present

## 2021-10-31 DIAGNOSIS — I480 Paroxysmal atrial fibrillation: Secondary | ICD-10-CM | POA: Diagnosis not present

## 2021-10-31 LAB — BASIC METABOLIC PANEL
Anion gap: 3 — ABNORMAL LOW (ref 5–15)
BUN: 10 mg/dL (ref 8–23)
CO2: 23 mmol/L (ref 22–32)
Calcium: 7.3 mg/dL — ABNORMAL LOW (ref 8.9–10.3)
Chloride: 110 mmol/L (ref 98–111)
Creatinine, Ser: 0.94 mg/dL (ref 0.44–1.00)
GFR, Estimated: 59 mL/min — ABNORMAL LOW (ref >60)
Glucose, Bld: 99 mg/dL (ref 70–99)
Potassium: 4.5 mmol/L (ref 3.5–5.1)
Sodium: 136 mmol/L (ref 135–145)

## 2021-10-31 LAB — CBC
HCT: 35.3 % — ABNORMAL LOW (ref 36.0–46.0)
Hemoglobin: 11.4 g/dL — ABNORMAL LOW (ref 12.0–15.0)
MCH: 29.9 pg (ref 26.0–34.0)
MCHC: 32.3 g/dL (ref 30.0–36.0)
MCV: 92.7 fL (ref 80.0–100.0)
Platelets: 270 10*3/uL (ref 150–400)
RBC: 3.81 MIL/uL — ABNORMAL LOW (ref 3.87–5.11)
RDW: 12.8 % (ref 11.5–15.5)
WBC: 9 10*3/uL (ref 4.0–10.5)
nRBC: 0 % (ref 0.0–0.2)

## 2021-10-31 LAB — URINE CULTURE: Culture: 20000 — AB

## 2021-10-31 NOTE — Progress Notes (Signed)
Mobility Specialist Progress Note:   10/31/21 1030  Mobility  Activity Ambulated with assistance in hallway  Level of Assistance Contact guard assist, steadying assist  Assistive Device None  Distance Ambulated (ft) 275 ft  Activity Response Tolerated well  $Mobility charge 1 Mobility   Pt agreeable to mobility session. Required minG throughout. Pt c/o dizziness upon standing, stating from allergies. No physical assistance required. Left in bed with all needs met, encouraged OOB mobility often.   Nelta Numbers Acute Rehab Secure Chat or Office Phone: [phone]

## 2021-10-31 NOTE — Progress Notes (Signed)
  Progress Note   Patient: [name] CXK:[id number] DOB: [birth year] DOA: 10/29/2021     1 DOS: the patient was seen and examined on 10/31/2021   Brief hospital course: 88 year old woman PMH including atrial fibrillation on Xarelto presented with chest pressure and abdominal pain, found to be febrile.  Admitted for acute diverticulitis. Slowly improving; likely home 5/30.  Assessment and Plan: * Sepsis ([place]) --secondary to acute diverticulitis; BC pending --hemodynamics stable; no recurrent fever now 48 hours; WBC has normalized  Acute diverticulitis --with fever, leukocytosis, ongoing pain but no n/v; passing gas --continue empiric abx, follow-up culture data  Elevated troponin --trivial, ACS ruled out  Chest pain --atypical, secondary to chronic cough per son, troponins flat, EKG nonacute, echo WNL, no further evaluation  AF (paroxysmal atrial fibrillation) ([place]) --continue diltiazem and Xarelto  Obstructive bronchiectasis (HCC) --stable        Subjective:  Feels better, less pain, eating ok  Physical Exam: Vitals:   10/31/21 0607 10/31/21 0844 10/31/21 1201 10/31/21 1649  BP: 134/70 (!) 158/71 125/64 138/65  Pulse: 78 86 87 88  Resp: '18 17 16 16  '$ Temp: 98 F (36.7 C) (!) 97.4 F (36.3 C) 98.8 F (37.1 C) 98.5 F (36.9 C)  TempSrc: Oral Oral Oral Oral  SpO2: 95% 95% 97% 99%  Weight:      Height:       Physical Exam Vitals reviewed.  Constitutional:      General: She is not in acute distress.    Appearance: She is not ill-appearing or toxic-appearing.  Cardiovascular:     Rate and Rhythm: Normal rate and regular rhythm.     Heart sounds: No murmur heard. Pulmonary:     Effort: Pulmonary effort is normal. No respiratory distress.     Breath sounds: No wheezing, rhonchi or rales.  Abdominal:     General: There is no distension.     Tenderness: There is no abdominal tenderness.  Neurological:     Mental Status: She is alert.  Psychiatric:         Mood and Affect: Mood normal.        Behavior: Behavior normal.    Data Reviewed:  BMP stable CBC stable  Family Communication: none  Disposition: Status is: Inpatient Remains inpatient appropriate because: acute diverticulitis  Planned Discharge Destination: Home    Time spent: 20 minutes  Author: Murray Hodgkins, MD 10/31/2021 6:08 PM  For on call review [email or web address].

## 2021-10-31 NOTE — Progress Notes (Signed)
Mobility Specialist Progress Note:   10/31/21 1645  Mobility  Activity Ambulated with assistance in hallway  Level of Assistance Standby assist, set-up cues, supervision of patient - no hands on  Assistive Device None  Distance Ambulated (ft) 550 ft  Activity Response Tolerated well  $Mobility charge 1 Mobility   Pt eager for second mobility session. No physical assistance needed throughout. Pt back in room with all needs met.   Anna Kincaid Acute Rehab Secure Chat or Office Phone: [phone]  

## 2021-10-31 NOTE — Progress Notes (Signed)
Pt refused cardizem, claiming her cardiologist did not order it so she wasn't going to take it. Pt request prn hydrocondone of 1, returned extrra for hip pain. Pt after iv antibiotics said hand hurt a bit. Hand appeared slightly erythematosa given ice pack to relief and swelling .  [name]] 10/31/21 11:52 PM

## 2021-11-01 DIAGNOSIS — K5792 Diverticulitis of intestine, part unspecified, without perforation or abscess without bleeding: Secondary | ICD-10-CM | POA: Diagnosis not present

## 2021-11-01 DIAGNOSIS — A419 Sepsis, unspecified organism: Secondary | ICD-10-CM | POA: Diagnosis not present

## 2021-11-01 LAB — BLOOD CULTURE ID PANEL (REFLEXED) - BCID2

## 2021-11-01 MED ORDER — METRONIDAZOLE 500 MG PO TABS: 500.0000 mg | ORAL_TABLET | Freq: Two times a day (BID) | ORAL | Status: DC

## 2021-11-01 MED ORDER — CALCIUM CARBONATE ANTACID 500 MG PO CHEW: 1.0000 | CHEWABLE_TABLET | Freq: Every day | ORAL | Status: DC

## 2021-11-01 MED ORDER — METRONIDAZOLE 500 MG PO TABS: 500.0000 mg | ORAL_TABLET | Freq: Three times a day (TID) | ORAL | 0 refills | Status: AC

## 2021-11-01 MED ORDER — CEFUROXIME AXETIL 500 MG PO TABS: 500.0000 mg | ORAL_TABLET | Freq: Two times a day (BID) | ORAL | 0 refills | Status: AC

## 2021-11-01 NOTE — Progress Notes (Addendum)
Pharmacy Antibiotic Note  [name] is a 88 y.o. female admitted on 10/29/2021 presenting with SOB, chills, leukocytosis and fever.  Pharmacy has been consulted for cefepime dosing.  WBC trending down, afebrile  Plan: Continue Cefepime 2g IV q 12h Transition metronidazole from IV to PO '500mg'$  q12h Daily SCr, WBC, and temp Monitor for clinical s/sx of infection and resolution and de-escalate antibiotic therapy as able  Height: '5\' 4"'$  (162.6 cm) Weight: 58.2 kg (128 lb 4.9 oz) IBW/kg (Calculated) : 54.7  Temp (24hrs), Avg:98.5 F (36.9 C), Min:97.9 F (36.6 C), Max:99.3 F (37.4 C)  Recent Labs  Lab 10/29/21 1911 10/29/21 2053 10/31/21 0028  WBC 17.7*  --  9.0  CREATININE 0.66  --  0.94  LATICACIDVEN 1.4 1.0  --     Estimated Creatinine Clearance: 36.4 mL/min (by C-G formula based on SCr of 0.94 mg/dL).    Allergies  Allergen Reactions   Hydralazine Hcl Shortness Of Breath    With throat numbness/tingling and swelling   Ciprofloxacin Other (See Comments)    triggered a migraine that was thought to be a TIA   Penicillins Rash    Has patient had a PCN reaction causing immediate rash, facial/tongue/throat swelling, SOB or lightheadedness with hypotension: Yes Has patient had a PCN reaction causing severe rash involving mucus membranes or skin necrosis: No Has patient had a PCN reaction that required hospitalization: No Has patient had a PCN reaction occurring within the last 10 years: No If all of the above answers are "NO", then may proceed with Cephalosporin use.   Sulfonamide Derivatives Swelling   Apixaban Other (See Comments)    Other reaction(s): weakness   Avelox [Moxifloxacin] Other (See Comments)    Rash and severe tingling/numbness    Band-Aid Infection Defense [Bacitracin-Polymyxin B] Other (See Comments)    unknown   Dome-Paste Bandage [Wound Dressings] Hives   Mannitol    Multaq [Dronedarone]     Muscle aches   Nitrofurantoin Monohyd Macro     Other  reaction(s): due to lung disease   Other Other (See Comments)    Water for injection, sterile - unknown   Prednisone     Other reaction(s): rash   Reclast [Zoledronic Acid] Other (See Comments)    hypertension   Sulfa Antibiotics Other (See Comments)    unknown   Chlorpheniramine Other (See Comments)    Headache, stomach ache, bladder "not working well"    Antimicrobials this admission: Vancomycin x 1 5/27 Cefepime 5/27 >>  Metronidazole 5/27 >>   Dose adjustments this admission: N/A  Microbiology results: 5/27 BCx: NGTD x 3d 5/27 UCx: 20,000 col/ml multiple species, recollection indicated   Thank you for allowing pharmacy to be a part of this patient's care.  Kaleen Mask 11/01/2021 9:13 AM

## 2021-11-01 NOTE — Discharge Summary (Signed)
Physician Discharge Summary   Patient: [name] MRN: [id number] DOB: [birth year]  Admit date:     10/29/2021  Discharge date: 11/01/21  Discharge Physician: Murray Hodgkins   PCP: Orpah Melter, MD  GI: Dr. Michail Sermon  Recommendations at discharge:   Acute diverticulitis --With second episode in the last 12 months, first was in November 2022 cared for by Dr. Michail Sermon. --She will follow-up with Dr. Michail Sermon as an outpatient for further recommendations.  I been in touch with him and he will arrange outpatient follow-up.  1/2 positive BC, likely contaminant, will follow  Discharge Diagnoses: Principal Problem:   Sepsis ([place]) Active Problems:   Acute diverticulitis   Chest pain   Elevated troponin   AF (paroxysmal atrial fibrillation) (HCC)   Obstructive bronchiectasis ([place])  Resolved Problems:   * No resolved hospital problems. *  Hospital Course: 88 year old woman PMH including atrial fibrillation on Xarelto presented with chest pressure and abdominal pain, found to be febrile.  Admitted for acute diverticulitis.  Treated with empiric antibiotics with gradual clinical improvement.  Discharged home in good condition.  * Sepsis ([place]) --secondary to acute diverticulitis; resolved, afebrile.  Leukocytosis resolved.  BC pending; on day of discharge 1/2 positive, likely contaminant, will monitor as outpatient.  Acute diverticulitis --with fever, leukocytosis on admission, now resolved. --With second episode in the last 12 months, first was in November 2022 cared for by Dr. Michail Sermon. --She will follow-up with Dr. Michail Sermon as an outpatient for further recommendations.  I been in touch with him and he will arrange outpatient follow-up.  Elevated troponin --trivial, ACS ruled out  Chest pain --atypical, secondary to chronic cough per son, troponins flat, EKG nonacute, echo WNL, no further evaluation  AF (paroxysmal atrial fibrillation) (HCC) --continue diltiazem and  Xarelto  Obstructive bronchiectasis ([place]) --stable        Consultants:  None Procedures performed:  None  Disposition: Home Diet recommendation:  Discharge Diet Orders (From admission, onward)     Start     Ordered   11/01/21 0000  Diet - low sodium heart healthy        11/01/21 1247           Regular diet DISCHARGE MEDICATION: Allergies as of 11/01/2021       Reactions   Hydralazine Hcl Shortness Of Breath   With throat numbness/tingling and swelling   Ciprofloxacin Other (See Comments)   triggered a migraine that was thought to be a TIA   Penicillins Rash   Has patient had a PCN reaction causing immediate rash, facial/tongue/throat swelling, SOB or lightheadedness with hypotension: Yes Has patient had a PCN reaction causing severe rash involving mucus membranes or skin necrosis: No Has patient had a PCN reaction that required hospitalization: No Has patient had a PCN reaction occurring within the last 10 years: No If all of the above answers are "NO", then may proceed with Cephalosporin use.   Sulfonamide Derivatives Swelling   Apixaban Other (See Comments)   Other reaction(s): weakness   Avelox [moxifloxacin] Other (See Comments)   Rash and severe tingling/numbness    Band-aid Infection Defense [bacitracin-polymyxin B] Other (See Comments)   unknown   Dome-paste Bandage [wound Dressings] Hives   Mannitol    Multaq [dronedarone]    Muscle aches   Nitrofurantoin Monohyd Macro    Other reaction(s): due to lung disease   Other Other (See Comments)   Water for injection, sterile - unknown   Prednisone    Other reaction(s): rash  Reclast [zoledronic Acid] Other (See Comments)   hypertension   Sulfa Antibiotics Other (See Comments)   unknown   Chlorpheniramine Other (See Comments)   Headache, stomach ache, bladder "not working well"        Medication List     TAKE these medications    acetaminophen 500 MG tablet Commonly known as: TYLENOL Take  1,000 mg by mouth every 6 (six) hours as needed for fever or mild pain.   calcium carbonate 500 MG chewable tablet Commonly known as: Tums Chew 1 tablet (200 mg of elemental calcium total) by mouth daily.   cefUROXime 500 MG tablet Commonly known as: CEFTIN Take 1 tablet (500 mg total) by mouth 2 (two) times daily for 10 days.   denosumab 60 MG/ML Sosy injection Commonly known as: PROLIA Inject 60 mg into the skin every 6 (six) months.   diltiazem 30 MG tablet Commonly known as: Cardizem Take 1 tablet (30 mg total) by mouth 4 (four) times daily as needed (for elevated heart rates).   diltiazem 120 MG 24 hr tablet Commonly known as: CARDIZEM LA Take 120 mg by mouth daily.   feeding supplement Liqd Take 1 Container by mouth daily.   HYDROcodone-acetaminophen 5-325 MG tablet Commonly known as: NORCO/VICODIN Take 1 tablet by mouth at bedtime.   metroNIDAZOLE 500 MG tablet Commonly known as: Flagyl Take 1 tablet (500 mg total) by mouth 3 (three) times daily for 10 days.   omeprazole 40 MG capsule Commonly known as: PRILOSEC Take 40 mg by mouth daily.   ONE-A-DAY WOMENS 50 PLUS PO Take 1 tablet by mouth daily.   predniSONE 10 MG tablet Commonly known as: DELTASONE Take 10-30 mg by mouth as directed.   Rivaroxaban 15 MG Tabs tablet Commonly known as: XARELTO Take 1 tablet (15 mg total) by mouth daily with supper. D/c eliquis        Follow-up Information     Wilford Corner, MD. Schedule an appointment as soon as possible for a visit.   Specialty: Gastroenterology Why: Office will contact you with an appointment Contact information: [address] [place] [address] [phone]               Feels better, less pain, eating  Discharge Exam: Filed Weights   10/29/21 1823  Weight: 58.2 kg   Physical Exam Vitals reviewed.  Constitutional:      General: She is not in acute distress.    Appearance: She is not ill-appearing or  toxic-appearing.  Cardiovascular:     Rate and Rhythm: Normal rate and regular rhythm.     Heart sounds: No murmur heard. Pulmonary:     Effort: Pulmonary effort is normal. No respiratory distress.     Breath sounds: No wheezing, rhonchi or rales.  Abdominal:     General: There is no distension.     Tenderness: There is no abdominal tenderness (minimal epigastric and LLQ).  Neurological:     Mental Status: She is alert.  Psychiatric:        Mood and Affect: Mood normal.        Behavior: Behavior normal.     Condition at discharge: good  The results of significant diagnostics from this hospitalization (including imaging, microbiology, ancillary and laboratory) are listed below for reference.   Imaging Studies: DG Chest 2 View  Result Date: 10/29/2021 CLINICAL DATA:  Dyspnea EXAM: CHEST - 2 VIEW COMPARISON:  CT chest dated 08/09/2021 FINDINGS: Chronic peribronchovascular nodular opacities in the  upper lobes, right greater than left, corresponding to chronic mycobacterial infection on prior CT. No focal consolidation. No pleural effusion or pneumothorax. The heart is normal in size. Visualized osseous structures are within normal limits. IMPRESSION: No evidence of acute cardiopulmonary disease. 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 Electronically Signed   By: Julian Hy M.D.   On: 10/29/2021 20:39   CT ABDOMEN PELVIS W CONTRAST  Result Date: 10/29/2021 CLINICAL DATA:  Diverticulitis EXAM: CT ABDOMEN AND PELVIS WITH CONTRAST TECHNIQUE: Multidetector CT imaging of the abdomen and pelvis was performed using the standard protocol following bolus administration of intravenous contrast. RADIATION DOSE REDUCTION: This exam was performed according to the departmental dose-optimization program which includes automated exposure control, adjustment of the mA and/or kV according to patient size and/or use of iterative reconstruction technique. CONTRAST:  166m  OMNIPAQUE IOHEXOL 300 MG/ML  SOLN COMPARISON:  04/25/2021 FINDINGS: Lower chest: Stable 8 mm nodule in the medial right lower lobe (series 4/image 3), reflecting mucous plugging within a terminal bronchiole. Focal fat/altered perfusion along the falciform ligament. Hepatobiliary: 12 mm central right hepatic lobe cyst (series 2/image 11). Gallbladder is unremarkable. No intrahepatic or extrahepatic duct dilatation. Pancreas: Within normal limits. Spleen: Within normal limits. Adrenals/Urinary Tract: Adrenal glands are within normal limits. Mild bilateral renal cortical scarring.  No hydronephrosis. Mildly distended bladder. Stomach/Bowel: Stomach is within normal limits. No evidence of bowel obstruction. Appendix is not discretely visualized. Extensive sigmoid diverticulosis with mild diverticulitis in the left lower quadrant (series 2/image 87). No drainable fluid collection/abscess. No free air to suggest macroscopic perforation. Overall appearance is improved from the prior. Vascular/Lymphatic: No evidence of abdominal aortic aneurysm. Atherosclerotic calcifications of the abdominal aorta and branch vessels. No suspicious abdominopelvic lymphadenopathy. Reproductive: Uterus is within normal limits.  No adnexal masses. Other: No abdominopelvic ascites. Musculoskeletal: Mild degenerative changes of the lumbar spine. IMPRESSION: Mild sigmoid diverticulitis, improved from the prior. No drainable fluid collection/abscess. No free air to suggest macroscopic perforation. Electronically Signed   By: SJulian HyM.D.   On: 10/29/2021 20:52   ECHOCARDIOGRAM COMPLETE  Result Date: 10/30/2021    ECHOCARDIOGRAM REPORT   Patient Name:   ECHADE PITNERDate of Exam: 10/30/2021 Medical Rec #:  [id number]      Height:       64.0 in Accession #:    [id number]     Weight:       128.3 lb Date of Birth:  121-Apr-1934      BSA:          1.620 m Patient Age:    89years        BP:           155/75 mmHg Patient Gender: F                HR:           91 bpm. Exam Location:  Inpatient Procedure: Cardiac Doppler and Color Doppler Indications:    Elevated troponin  History:        Patient has prior history of Echocardiogram examinations, most                 recent 05/20/2020. Arrythmias:Atrial Fibrillation; Risk                 Factors:Hypertension. Cancer.  Sonographer:    RMerrie RoofRDCS Referring Phys: [id number] 1. Left ventricular ejection fraction, by estimation, is 60 to 65%. The left  ventricle has normal function. The left ventricle has no regional wall motion abnormalities. Left ventricular diastolic parameters were normal.  2. Right ventricular systolic function is normal. The right ventricular size is normal. There is normal pulmonary artery systolic pressure.  3. The pericardial effusion is anterior to the right ventricle.  4. The mitral valve is normal in structure. No evidence of mitral valve regurgitation. No evidence of mitral stenosis.  5. The aortic valve is tricuspid. There is mild calcification of the aortic valve. Aortic valve regurgitation is mild. Aortic valve sclerosis is present, with no evidence of aortic valve stenosis.  6. The inferior vena cava is dilated in size with >50% respiratory variability, suggesting right atrial pressure of 8 mmHg. FINDINGS  Left Ventricle: Left ventricular ejection fraction, by estimation, is 60 to 65%. The left ventricle has normal function. The left ventricle has no regional wall motion abnormalities. The left ventricular internal cavity size was normal in size. There is  no left ventricular hypertrophy. Left ventricular diastolic parameters were normal. Right Ventricle: The right ventricular size is normal. No increase in right ventricular wall thickness. Right ventricular systolic function is normal. There is normal pulmonary artery systolic pressure. The tricuspid regurgitant velocity is 2.22 m/s, and  with an assumed right atrial pressure of 3 mmHg, the  estimated right ventricular systolic pressure is 77.8 mmHg. Left Atrium: Left atrial size was normal in size. Right Atrium: Right atrial size was normal in size. Pericardium: Trivial pericardial effusion is present. The pericardial effusion is anterior to the right ventricle. Mitral Valve: The mitral valve is normal in structure. No evidence of mitral valve regurgitation. No evidence of mitral valve stenosis. Tricuspid Valve: The tricuspid valve is normal in structure. Tricuspid valve regurgitation is mild . No evidence of tricuspid stenosis. Aortic Valve: The aortic valve is tricuspid. There is mild calcification of the aortic valve. Aortic valve regurgitation is mild. Aortic regurgitation PHT measures 342 msec. Aortic valve sclerosis is present, with no evidence of aortic valve stenosis. Aortic valve mean gradient measures 4.0 mmHg. Aortic valve peak gradient measures 6.5 mmHg. Aortic valve area, by VTI measures 2.42 cm. Pulmonic Valve: The pulmonic valve was normal in structure. Pulmonic valve regurgitation is not visualized. No evidence of pulmonic stenosis. Aorta: The aortic root is normal in size and structure. Venous: The inferior vena cava is dilated in size with greater than 50% respiratory variability, suggesting right atrial pressure of 8 mmHg. IAS/Shunts: No atrial level shunt detected by color flow Doppler.  LEFT VENTRICLE PLAX 2D LVIDd:         3.60 cm   Diastology LVIDs:         2.40 cm   LV e' medial:    7.62 cm/s LV PW:         1.00 cm   LV E/e' medial:  11.5 LV IVS:        0.90 cm   LV e' lateral:   9.32 cm/s LVOT diam:     1.80 cm   LV E/e' lateral: 9.4 LV SV:         55 LV SV Index:   34 LVOT Area:     2.54 cm  RIGHT VENTRICLE             IVC RV Basal diam:  3.00 cm     IVC diam: 2.00 cm RV S prime:     15.20 cm/s TAPSE (M-mode): 2.4 cm LEFT ATRIUM  Index        RIGHT ATRIUM           Index LA diam:        3.40 cm 2.10 cm/m   RA Area:     14.20 cm LA Vol (A2C):   33.6 ml 20.74  ml/m  RA Volume:   32.70 ml  20.18 ml/m LA Vol (A4C):   41.2 ml 25.43 ml/m LA Biplane Vol: 37.1 ml 22.90 ml/m  AORTIC VALVE AV Area (Vmax):    2.56 cm AV Area (Vmean):   2.29 cm AV Area (VTI):     2.42 cm AV Vmax:           127.00 cm/s AV Vmean:          91.700 cm/s AV VTI:            0.229 m AV Peak Grad:      6.5 mmHg AV Mean Grad:      4.0 mmHg LVOT Vmax:         128.00 cm/s LVOT Vmean:        82.400 cm/s LVOT VTI:          0.218 m LVOT/AV VTI ratio: 0.95 AI PHT:            342 msec  AORTA Ao Root diam: 3.20 cm MITRAL VALVE                TRICUSPID VALVE MV Area (PHT): 4.39 cm     TR Peak grad:   19.7 mmHg MV Decel Time: 173 msec     TR Vmax:        222.00 cm/s MV E velocity: 87.70 cm/s MV A velocity: 102.00 cm/s  SHUNTS MV E/A ratio:  0.86         Systemic VTI:  0.22 m                             Systemic Diam: 1.80 cm Jenkins Rouge MD Electronically signed by Jenkins Rouge MD Signature Date/Time: 10/30/2021/11:45:37 AM    Final    CUP PACEART REMOTE DEVICE CHECK  Result Date: 10/04/2021 ILR summary report received. Battery status OK. Normal device function. No new symptom,  brady, or pause episodes. No new AF episodes. Monthly summary reports and ROV/PRN 4 tachy events, 10-15sec in duration, HR's 167-171 LA   Microbiology: Results for orders placed or performed during the hospital encounter of 10/29/21  Blood Culture (routine x 2)     Status: None (Preliminary result)   Collection Time: 10/29/21  7:11 PM   Specimen: BLOOD  Result Value Ref Range Status   Specimen Description   Final    BLOOD LEFT ANTECUBITAL Performed at [facility], [address] [place], [place] [address]    Special Requests   Final    BOTTLES DRAWN AEROBIC AND ANAEROBIC Blood Culture results may not be optimal due to an excessive volume of blood received in culture bottles Performed at [facility], [address], [place], [place] [address]    Culture  Setup Time   Final     GRAM POSITIVE COCCI ANAEROBIC BOTTLE ONLY CRITICAL RESULT CALLED TO, READ BACK BY AND VERIFIED WITH: PHARMD RACHEL SHELLEY ON 11/01/21 @ 1523 BY DRT    Culture   Final    CULTURE REINCUBATED FOR BETTER GROWTH Performed at [facility], [place] [address] [place], [place] [address]  Report Status PENDING  Incomplete  Urine Culture     Status: Abnormal   Collection Time: 10/29/21  7:11 PM   Specimen: In/Out Cath Urine  Result Value Ref Range Status   Specimen Description   Final    IN/OUT CATH URINE Performed at [facility], [address] [place], [place] [address]    Special Requests   Final    NONE Performed at [facility], [address] [place], [place] [address]    Culture (A)  Final    20,000 COLONIES/mL MULTIPLE SPECIES PRESENT, SUGGEST RECOLLECTION   Report Status 10/31/2021 FINAL  Final  Resp Panel by RT-PCR (Flu A&B, Covid) Anterior Nasal Swab     Status: None   Collection Time: 10/29/21  7:11 PM   Specimen: Anterior Nasal Swab  Result Value Ref Range Status   SARS Coronavirus 2 by RT PCR NEGATIVE NEGATIVE Final    Comment: (NOTE) SARS-CoV-2 target nucleic acids are NOT DETECTED.  The SARS-CoV-2 RNA is generally detectable in upper respiratory specimens during the acute phase of infection. The lowest concentration of SARS-CoV-2 viral copies this assay can detect is 138 copies/mL. A negative result does not preclude SARS-Cov-2 infection and should not be used as the sole basis for treatment or other patient management decisions. A negative result may occur with  improper specimen collection/handling, submission of specimen other than nasopharyngeal swab, presence of viral mutation(s) within the areas targeted by this assay, and inadequate number of viral copies(<138 copies/mL). A negative result must be combined with clinical observations, patient history, and epidemiological information. The expected result is  Negative.  Fact Sheet for Patients:  EntrepreneurPulse.com.au  Fact Sheet for Healthcare Providers:  IncredibleEmployment.be  This test is no t yet approved or cleared by the [place] FDA and  has been authorized for detection and/or diagnosis of SARS-CoV-2 by FDA under an Emergency Use Authorization (EUA). This EUA will remain  in effect (meaning this test can be used) for the duration of the COVID-19 declaration under Section 564(b)(1) of the Act, 21 U.S.C.section 360bbb-3(b)(1), unless the authorization is terminated  or revoked sooner.       Influenza A by PCR NEGATIVE NEGATIVE Final   Influenza B by PCR NEGATIVE NEGATIVE Final    Comment: (NOTE) The Xpert Xpress SARS-CoV-2/FLU/RSV plus assay is intended as an aid in the diagnosis of influenza from Nasopharyngeal swab specimens and should not be used as a sole basis for treatment. Nasal washings and aspirates are unacceptable for Xpert Xpress SARS-CoV-2/FLU/RSV testing.  Fact Sheet for Patients: EntrepreneurPulse.com.au  Fact Sheet for Healthcare Providers: IncredibleEmployment.be  This test is not yet approved or cleared by the [place] FDA and has been authorized for detection and/or diagnosis of SARS-CoV-2 by FDA under an Emergency Use Authorization (EUA). This EUA will remain in effect (meaning this test can be used) for the duration of the COVID-19 declaration under Section 564(b)(1) of the Act, 21 U.S.C. section 360bbb-3(b)(1), unless the authorization is terminated or revoked.  Performed at [facility], [address], [place],  [address]   Blood Culture ID Panel (Reflexed)     Status: None   Collection Time: 10/29/21  7:11 PM  Result Value Ref Range Status   Enterococcus faecalis NOT DETECTED NOT DETECTED Final   Enterococcus Faecium NOT DETECTED NOT DETECTED Final   Listeria monocytogenes NOT  DETECTED NOT DETECTED Final   Staphylococcus species NOT DETECTED NOT DETECTED Final   Staphylococcus aureus (BCID) NOT DETECTED NOT  DETECTED Final   Staphylococcus epidermidis NOT DETECTED NOT DETECTED Final   Staphylococcus lugdunensis NOT DETECTED NOT DETECTED Final   Streptococcus species NOT DETECTED NOT DETECTED Final   Streptococcus agalactiae NOT DETECTED NOT DETECTED Final   Streptococcus pneumoniae NOT DETECTED NOT DETECTED Final   Streptococcus pyogenes NOT DETECTED NOT DETECTED Final   A.calcoaceticus-baumannii NOT DETECTED NOT DETECTED Final   Bacteroides fragilis NOT DETECTED NOT DETECTED Final   Enterobacterales NOT DETECTED NOT DETECTED Final   Enterobacter cloacae complex NOT DETECTED NOT DETECTED Final   Escherichia coli NOT DETECTED NOT DETECTED Final   Klebsiella aerogenes NOT DETECTED NOT DETECTED Final   Klebsiella oxytoca NOT DETECTED NOT DETECTED Final   Klebsiella pneumoniae NOT DETECTED NOT DETECTED Final   Proteus species NOT DETECTED NOT DETECTED Final   Salmonella species NOT DETECTED NOT DETECTED Final   Serratia marcescens NOT DETECTED NOT DETECTED Final   Haemophilus influenzae NOT DETECTED NOT DETECTED Final   Neisseria meningitidis NOT DETECTED NOT DETECTED Final   Pseudomonas aeruginosa NOT DETECTED NOT DETECTED Final   Stenotrophomonas maltophilia NOT DETECTED NOT DETECTED Final   Candida albicans NOT DETECTED NOT DETECTED Final   Candida auris NOT DETECTED NOT DETECTED Final   Candida glabrata NOT DETECTED NOT DETECTED Final   Candida krusei NOT DETECTED NOT DETECTED Final   Candida parapsilosis NOT DETECTED NOT DETECTED Final   Candida tropicalis NOT DETECTED NOT DETECTED Final   Cryptococcus neoformans/gattii NOT DETECTED NOT DETECTED Final    Comment: Performed at [facility], [address], [place], [place] [address]  Blood Culture (routine x 2)     Status: None (Preliminary result)   Collection Time: 10/29/21  7:17 PM   Specimen:  BLOOD RIGHT FOREARM  Result Value Ref Range Status   Specimen Description   Final    BLOOD RIGHT FOREARM Performed at [facility], [address], [place], [place] [address]    Special Requests   Final    BOTTLES DRAWN AEROBIC AND ANAEROBIC Blood Culture results may not be optimal due to an excessive volume of blood received in culture bottles Performed at [facility], [address] [place], [place] [address]    Culture   Final    NO GROWTH 3 DAYS Performed at [facility], [place] [address], [place], [place] [address]    Report Status PENDING  Incomplete    Labs: CBC: Recent Labs  Lab 10/29/21 1911 10/31/21 0028  WBC 17.7* 9.0  NEUTROABS 16.6*  --   HGB 12.7 11.4*  HCT 37.8 35.3*  MCV 89.2 92.7  PLT 295 003   Basic Metabolic Panel: Recent Labs  Lab 10/29/21 1911 10/31/21 0028  NA 132* 136  K 4.0 4.5  CL 102 110  CO2 21* 23  GLUCOSE 104* 99  BUN 19 10  CREATININE 0.66 0.94  CALCIUM 8.4* 7.3*   Liver Function Tests: Recent Labs  Lab 10/29/21 1911  AST 23  ALT 14  ALKPHOS 60  BILITOT 0.4  PROT 6.6  ALBUMIN 3.3*   CBG: No results for input(s): GLUCAP in the last 168 hours.  Discharge time spent: less than 30 minutes.  Signed: Murray Hodgkins, MD [facility] 11/01/2021

## 2021-11-01 NOTE — Progress Notes (Signed)
Mobility Specialist Progress Note:   11/01/21 1500  Mobility  Activity Ambulated independently in room  Level of Assistance Standby assist, set-up cues, supervision of patient - no hands on  Assistive Device None  Distance Ambulated (ft) 50 ft  Activity Response Tolerated well  $Mobility charge 1 Mobility   Pt agreeable to mobility session however declining hallway ambulation d/t d/c soon. Pt back in chair with all needs met.   Anna [facility] Secure Chat or Office Phone: [phone]  

## 2021-11-01 NOTE — Discharge Planning (Signed)
Patient discharged home in stable condition. Verbalizes understanding of all discharge instructions, including home medications and follow up appointments. 

## 2021-11-01 NOTE — Progress Notes (Signed)
Mobility Specialist Progress Note:   11/01/21 1030  Mobility  Activity Ambulated with assistance in hallway  Level of Assistance Contact guard assist, steadying assist  Assistive Device Other (Comment) (HHA)  Distance Ambulated (ft) 200 ft  Activity Response Tolerated well  $Mobility charge 1 Mobility   Pt eager for mobility session this am. Required HHA throughout d/t pt feeling "head crazy" d/t meds. Distance limited d/t dizziness. Pt back in chair with all needs met.   Anna Kinc[facility] Secure Chat or Office Phone: [phone]  

## 2021-11-01 NOTE — Progress Notes (Signed)
PHARMACY - PHYSICIAN COMMUNICATION CRITICAL VALUE ALERT - BLOOD CULTURE IDENTIFICATION (BCID)  [name] is an 88 y.o. female who presented to [facility] on 10/29/2021 with a chief complaint of sepsis secondary to acute diverticulitis  Assessment:  1/4 (anaerobic only) gram positive cocci. Likely a contaminant.   Name of physician (or Provider) Contacted: Sarajane Jews  Current antibiotics: cefepime and metronidazole  Changes to prescribed antibiotics recommended:  Likely contaminant.  Patient is on recommended antibiotics - No changes needed  Results for orders placed or performed during the hospital encounter of 10/29/21  Blood Culture ID Panel (Reflexed) (Collected: 10/29/2021  7:11 PM)  Result Value Ref Range   Enterococcus faecalis NOT DETECTED NOT DETECTED   Enterococcus Faecium NOT DETECTED NOT DETECTED   Listeria monocytogenes NOT DETECTED NOT DETECTED   Staphylococcus species NOT DETECTED NOT DETECTED   Staphylococcus aureus (BCID) NOT DETECTED NOT DETECTED   Staphylococcus epidermidis NOT DETECTED NOT DETECTED   Staphylococcus lugdunensis NOT DETECTED NOT DETECTED   Streptococcus species NOT DETECTED NOT DETECTED   Streptococcus agalactiae NOT DETECTED NOT DETECTED   Streptococcus pneumoniae NOT DETECTED NOT DETECTED   Streptococcus pyogenes NOT DETECTED NOT DETECTED   A.calcoaceticus-baumannii NOT DETECTED NOT DETECTED   Bacteroides fragilis NOT DETECTED NOT DETECTED   Enterobacterales NOT DETECTED NOT DETECTED   Enterobacter cloacae complex NOT DETECTED NOT DETECTED   Escherichia coli NOT DETECTED NOT DETECTED   Klebsiella aerogenes NOT DETECTED NOT DETECTED   Klebsiella oxytoca NOT DETECTED NOT DETECTED   Klebsiella pneumoniae NOT DETECTED NOT DETECTED   Proteus species NOT DETECTED NOT DETECTED   Salmonella species NOT DETECTED NOT DETECTED   Serratia marcescens NOT DETECTED NOT DETECTED   Haemophilus influenzae NOT DETECTED NOT DETECTED   Neisseria meningitidis  NOT DETECTED NOT DETECTED   Pseudomonas aeruginosa NOT DETECTED NOT DETECTED   Stenotrophomonas maltophilia NOT DETECTED NOT DETECTED   Candida albicans NOT DETECTED NOT DETECTED   Candida auris NOT DETECTED NOT DETECTED   Candida glabrata NOT DETECTED NOT DETECTED   Candida krusei NOT DETECTED NOT DETECTED   Candida parapsilosis NOT DETECTED NOT DETECTED   Candida tropicalis NOT DETECTED NOT DETECTED   Cryptococcus neoformans/gattii NOT DETECTED NOT DETECTED    [name] 11/01/2021  3:26 PM

## 2021-11-03 LAB — CULTURE, BLOOD (ROUTINE X 2): Culture: NO GROWTH

## 2021-11-07 ENCOUNTER — Ambulatory Visit (INDEPENDENT_AMBULATORY_CARE_PROVIDER_SITE_OTHER): Payer: Medicare Other

## 2021-11-07 DIAGNOSIS — R55 Syncope and collapse: Secondary | ICD-10-CM | POA: Diagnosis not present

## 2021-11-08 LAB — CUP PACEART REMOTE DEVICE CHECK
Date Time Interrogation Session: 20230606104953
Implantable Pulse Generator Implant Date: 20210824

## 2021-11-14 ENCOUNTER — Telehealth: Payer: Self-pay | Admitting: Family Medicine

## 2021-11-14 ENCOUNTER — Encounter: Payer: Medicare Other | Admitting: Cardiology

## 2021-11-14 NOTE — Telephone Encounter (Signed)
BC grew 1/2 PARVIMONAS MICRA. Prescribed treatment was adequate but this bacterium can be associated with colonic malignancy. I left a message with Dr. Olen Pel office and communicated via Epic with her GI physician Dr. Michail Sermon. Consider screening for colonic malignancy if appropriate for patient.   Murray Hodgkins, MD [facility]

## 2021-11-15 ENCOUNTER — Telehealth: Payer: Self-pay | Admitting: Family Medicine

## 2021-11-15 NOTE — Telephone Encounter (Signed)
In regard to bacteremia, I did discuss with Dr. Michail Sermon, he is aware and will discuss with patient next steps, potential colonoscopy. I also spoke with [place], who will coordinate with PCP. I recommended discussion with the patient pros/cons of colonoscopy.  Murray Hodgkins, MD [facility]

## 2021-11-21 ENCOUNTER — Telehealth: Payer: Self-pay | Admitting: Internal Medicine

## 2021-11-21 NOTE — Telephone Encounter (Signed)
Called and spoke with patient. She verbalized understanding.   While on the phone, she mentioned that she wasn't sure when she needed to come back for an appt. I looked at her last OV from February and MW wanted her to follow up in 6 months. I was able to get her scheduled for 01/10/22 at 2pm. She verbalized understanding.   Nothing further needed at time of call.

## 2021-11-21 NOTE — Telephone Encounter (Signed)
PT states on 5/27 she was admitted to hospital with diraticulosis. Kept for 3 days and put on medications. Would like MW to look over this and make sure he advised the medication as sometimes meds can affect her lungs. States hse is now weak and coughing more. Please advise.

## 2021-11-21 NOTE — Telephone Encounter (Signed)
No problem with those 2 abx

## 2021-11-21 NOTE — Telephone Encounter (Signed)
Called and spoke with patient. She stated that she was in the hospital on 10/29/21 for diverticulosis (chart says diverticulitis) and was discharged on 2 different antibiotics. She was prescribed cefuroxime '500mg'$  BID for 10 days. She was also prescribed Flagyl '500mg'$  TID for 10 days.   She wanted to know if those 2 antibiotics would affect her breathing. She has been coughing more lately. Slight increase in SOB. She has been in contact her PCP about the same issues and they have encouraged her to move around more and eat more during the day.   Dr. Melvyn Novas, can you please advise? Thanks!

## 2021-11-22 NOTE — Progress Notes (Signed)
Carelink Summary Report / Loop Recorder 

## 2021-11-30 ENCOUNTER — Other Ambulatory Visit (HOSPITAL_COMMUNITY): Payer: Self-pay | Admitting: Gastroenterology

## 2021-11-30 ENCOUNTER — Other Ambulatory Visit: Payer: Self-pay | Admitting: Gastroenterology

## 2021-11-30 DIAGNOSIS — R1032 Left lower quadrant pain: Secondary | ICD-10-CM

## 2021-11-30 DIAGNOSIS — K5792 Diverticulitis of intestine, part unspecified, without perforation or abscess without bleeding: Secondary | ICD-10-CM | POA: Diagnosis not present

## 2021-11-30 DIAGNOSIS — R7881 Bacteremia: Secondary | ICD-10-CM | POA: Diagnosis not present

## 2021-12-07 ENCOUNTER — Telehealth: Payer: Self-pay | Admitting: Cardiology

## 2021-12-07 NOTE — Telephone Encounter (Signed)
Left message for pt to call.

## 2021-12-07 NOTE — Telephone Encounter (Signed)
Spoke to patient she stated she woke up this morning dizzy,weak.Stated she sent in a loop recorder reading this morning.Stated she would like appointment with Dr.Camnitz.Advised I will send a message to [facility] and to Dr.Camnitz's RN for appointment.She refuses to see a PA.

## 2021-12-07 NOTE — Telephone Encounter (Signed)
Dizzy,  STAT if HR is under 50 or over 120 (normal HR is 60-100 beats per minute)  What is your heart rate?  139/75 hr 72   Do you have a log of your heart rate readings (document readings)? no  Do you have any other symptoms? Pt state that her hr has been high, she has been taking her bp but did not have any readings, she said she doesn't think the cuff is working right. She feels very weak and does not know what is going on. Pt states she would like a call back today, if possible. Please advise.

## 2021-12-07 NOTE — Telephone Encounter (Signed)
Theodoro Doing, RN and I reviewed transmission and symptom flag for today 12/07/21.  Normal sinus rhythm predominantly avg 90-100 with occasional PVC's.  Will route back to triage.

## 2021-12-08 ENCOUNTER — Ambulatory Visit (HOSPITAL_COMMUNITY)
Admission: RE | Admit: 2021-12-08 | Discharge: 2021-12-08 | Disposition: A | Discharge status: Home / Self Care | Payer: Medicare Other | Source: Ambulatory Visit | Attending: Gastroenterology | Admitting: Gastroenterology

## 2021-12-08 DIAGNOSIS — R1032 Left lower quadrant pain: Secondary | ICD-10-CM | POA: Diagnosis not present

## 2021-12-08 DIAGNOSIS — N2889 Other specified disorders of kidney and ureter: Secondary | ICD-10-CM | POA: Diagnosis not present

## 2021-12-08 DIAGNOSIS — K5792 Diverticulitis of intestine, part unspecified, without perforation or abscess without bleeding: Secondary | ICD-10-CM | POA: Insufficient documentation

## 2021-12-08 DIAGNOSIS — K7689 Other specified diseases of liver: Secondary | ICD-10-CM | POA: Diagnosis not present

## 2021-12-08 DIAGNOSIS — K6389 Other specified diseases of intestine: Secondary | ICD-10-CM | POA: Diagnosis not present

## 2021-12-08 MED ORDER — IOHEXOL 300 MG/ML  SOLN
100.0000 mL | Freq: Once | INTRAMUSCULAR | Status: AC | PRN
  Administered 2021-12-08: 100 mL via INTRAVENOUS

## 2021-12-10 LAB — CUP PACEART REMOTE DEVICE CHECK
Date Time Interrogation Session: 20230707000056
Implantable Pulse Generator Implant Date: 20210824

## 2021-12-12 ENCOUNTER — Ambulatory Visit: Payer: Medicare Other

## 2021-12-12 DIAGNOSIS — R55 Syncope and collapse: Secondary | ICD-10-CM

## 2021-12-27 DIAGNOSIS — E871 Hypo-osmolality and hyponatremia: Secondary | ICD-10-CM | POA: Diagnosis not present

## 2021-12-30 ENCOUNTER — Other Ambulatory Visit: Payer: Self-pay | Admitting: Gastroenterology

## 2021-12-30 DIAGNOSIS — K5792 Diverticulitis of intestine, part unspecified, without perforation or abscess without bleeding: Secondary | ICD-10-CM

## 2021-12-30 DIAGNOSIS — R1084 Generalized abdominal pain: Secondary | ICD-10-CM

## 2022-01-02 NOTE — Progress Notes (Signed)
Carelink Summary Report / Loop Recorder 

## 2022-01-09 DIAGNOSIS — H04123 Dry eye syndrome of bilateral lacrimal glands: Secondary | ICD-10-CM | POA: Diagnosis not present

## 2022-01-09 DIAGNOSIS — Z961 Presence of intraocular lens: Secondary | ICD-10-CM | POA: Diagnosis not present

## 2022-01-09 DIAGNOSIS — H26493 Other secondary cataract, bilateral: Secondary | ICD-10-CM | POA: Diagnosis not present

## 2022-01-10 ENCOUNTER — Ambulatory Visit (INDEPENDENT_AMBULATORY_CARE_PROVIDER_SITE_OTHER): Payer: Medicare Other | Admitting: Internal Medicine

## 2022-01-10 ENCOUNTER — Ambulatory Visit (INDEPENDENT_AMBULATORY_CARE_PROVIDER_SITE_OTHER): Payer: Medicare Other | Admitting: Cardiology

## 2022-01-10 ENCOUNTER — Encounter: Payer: Self-pay | Admitting: Internal Medicine

## 2022-01-10 ENCOUNTER — Encounter: Payer: Self-pay | Admitting: Cardiology

## 2022-01-10 VITALS — BP 114/76 | HR 79 | Ht 64.0 in | Wt 122.6 lb

## 2022-01-10 DIAGNOSIS — I480 Paroxysmal atrial fibrillation: Secondary | ICD-10-CM | POA: Diagnosis not present

## 2022-01-10 DIAGNOSIS — J479 Bronchiectasis, uncomplicated: Secondary | ICD-10-CM

## 2022-01-10 DIAGNOSIS — D6869 Other thrombophilia: Secondary | ICD-10-CM | POA: Diagnosis not present

## 2022-01-10 MED ORDER — OMEPRAZOLE MAGNESIUM 20 MG PO TBEC: 20.0000 mg | DELAYED_RELEASE_TABLET | Freq: Every day | ORAL | Status: DC

## 2022-01-10 MED ORDER — FAMOTIDINE 20 MG PO TABS: ORAL_TABLET | ORAL | Status: DC

## 2022-01-10 NOTE — Patient Instructions (Addendum)
Medication Instructions:  Your physician recommends that you continue on your current medications as directed. Please refer to the Current Medication list given to you today.  Labwork: None ordered.  Testing/Procedures: None ordered.  Follow-Up: Your physician wants you to follow-up in: 6 months with [facility].   Any Other Special Instructions Will Be Listed Below (If Applicable).  If you need a refill on your cardiac medications before your next appointment, please call your pharmacy.   Important Information About Sugar

## 2022-01-10 NOTE — Progress Notes (Signed)
Subjective:    Patient ID: [name], female    DOB: February 10, 1933    MRN: [id number]      Brief patient profile:  88 yowf never smoker/ MM   with recurrent cough since age 88 with right middle lobe/lingular syndrome with limited bronchiectatic changes on  CT study dated 09/2005    History of Present Illness    03/01/2016  f/u ov/[name] re:  Bronchiectasis with uacs/ some better on gerd rx  Chief Complaint  Patient presents with   Follow-up    Pt states her dry cough is unchanged since last OV. Pt states she has ocassional chest tightness and SOB but attributes this to stress. Pt also c/o hoarseness.    coughs up to an hour each am, completely non-productive even with flutter  rec No change in medications Nasty mucus > doxycline 100 mg twice daily x 10 days   Late add for nodular opacity RUL c/w MAI    zmax x 250 mg daily x 30 days then recheck cxr      02/03/2020  f/u ov/[name] re: bronchiectasis  Chief Complaint  Patient presents with   Follow-up    SOB and fatigue  Dyspnea:  Broke foot  When fell so no regular wallking now Cough: white, worse in am 's Sleeping: bed flat, 2 pillows  SABA use: none  02: none rec thyroid nodule f/u gerkin     08/01/2021  f/u ov/[name] re: bronchiectasis  maint on prn levaquin / not able to produced am mucus  Chief Complaint  Patient presents with   Follow-up    Doing ok. Coughing is better.    Dyspnea:  limited more by hips than breathing  Cough: rattle in am / no mucus  Sleeping: bed blocks,  SABA use: none  02: none  Rec Please remember to go to the  x-ray department  for your tests - we will call you with the results when they are available   Please schedule a follow up visit in 6  months but call sooner if needed      01/10/2022  f/u ov/[name] re: bronchiectasis  maint on flutter valve/ no longer on GERD rx with increasing dysphagia and cough worse p meals    Chief Complaint  Patient presents with   Follow-up    Pt was in  hospital at end of May for Diverticulosis. Still having a productive cough with chalky white sputum.  Dyspnea:  hips walks slow with cane  Cough: white chaulky worse p supper  Sleeping: bed blocks s resp cc  SABA use: none  02: none    No obvious day to day or daytime variability or assoc excess/ purulent sputum or mucus plugs or hemoptysis or cp or chest tightness, subjective wheeze or overt sinus or hb symptoms.   sleeping without nocturnal  or early am exacerbation  of respiratory  c/o's or need for noct saba. Also denies any obvious fluctuation of symptoms with weather or environmental changes or other aggravating or alleviating factors except as outlined above   No unusual exposure hx or h/o childhood pna/ asthma or knowledge of premature birth.  Current Allergies, Complete Past Medical History, Past Surgical History, Family History, and Social History were reviewed in [employer] record.  ROS  The following are not active complaints unless bolded Hoarseness, sore throat, dysphagia, dental problems, itching, sneezing,  nasal congestion or discharge of excess mucus or purulent secretions, ear ache,   fever, chills, sweats, unintended  wt loss or wt gain, classically pleuritic or exertional cp,  orthopnea pnd or arm/hand swelling  or leg swelling, presyncope, palpitations, abdominal pain, anorexia, nausea, vomiting, diarrhea  or change in bowel habits or change in bladder habits, change in stools or change in urine, dysuria, hematuria,  rash, arthralgias, visual complaints, headache, numbness, weakness or ataxia or problems with walking or coordination,  change in mood or  memory.        Current Meds  Medication Sig   diltiazem (CARDIZEM) 30 MG tablet Take 1 tablet (30 mg total) by mouth 4 (four) times daily as needed (for elevated heart rates).   omeprazole (PRILOSEC) 40 MG capsule Take 40 mg by mouth daily.   [DISCONTINUED] Rivaroxaban (XARELTO) 15 MG TABS tablet  Take 1 tablet (15 mg total) by mouth daily with supper. D/c eliquis              Past Medical History:  BRONCHIECTASIS (ICD-494.0) see CT scan of the chest dated 09/29/2005  - Pneumovax April 28, 2009 (over 65 so last one)  Prevnar given 04/27/2014   - Alpha one Screen  August 18, 2010 = MM  - Sinus CT August 18, 2010 >>  neg  COUGH, CHRONIC (ICD-786.2)  Left Kidney Cyst  - 04/21/09 [facility] PROCEDURE(S): ULTRASOUND GUIDED AND FLUOROSCOPIC GUIDED LEFT RENAL CYST ASPIRATION AND SCLEROSIS  LUQ Abd pain 2011...............................................................Marland KitchenSchooler  - CT ABD 07/29/10 no etiology        Objective:   Physical Exam  Wt  01/10/2022           122  08/01/2021         126  01/17/2021         121  10/11/2020           118 08/02/2020         126   02/03/2020        124  Wt  09/19/2019  120  07/22/2018         122  05/20/2018       129  11/30/2017         131  March 15,2012 137     Vital signs reviewed  01/10/2022  - Note at rest 02 sats  97% on RA   General appearance:    amb elderly wf nad with harsh dry cough    HEENT : Oropharynx  clear     Nasal turbinates nl    NECK :  without  apparent JVD/ palpable Nodes/TM    LUNGS: no acc muscle use,  Nl contour chest which is clear to A and P bilaterally without cough on insp or exp maneuvers   CV:  RRR  no s3 or murmur or increase in P2, and no edema   ABD:  soft and nontender with nl inspiratory excursion in the supine position. No bruits or organomegaly appreciated   MS:  Nl gait/ ext warm without deformities Or obvious joint restrictions  calf tenderness, cyanosis or clubbing    SKIN: warm and dry without lesions    NEURO:  alert, approp, nl sensorium with  no motor or cerebellar deficits apparent.       I personally reviewed images and agree with radiology impression as follows:  CT chest on abd cuts  12/08/21 Lower chest: Extensive cylindrical and varicoid bronchiectasis scattered throughout the right  greater than left lung bases, most prominent in the right middle lobe, not substantially changed from 08/09/2021 CT chest. Irregular peribronchovascular pulmonary nodules  throughout the right greater than left lung bases, generally stable and measuring up to 1.3 cm in the right middle lobe (series 6/image 17), although with growth of a 1.4 cm irregular solid posterior right lower lobe nodule (series 6/image 14), previously 0.7 cm on 08/09/2021 chest CT. Partially visualized subcutaneous loop recorder in the ventral left chest wall.           Assessment & Plan:

## 2022-01-10 NOTE — Assessment & Plan Note (Signed)
Onset ? Age 88 wit bronchiectasis first confirmed 2007  - Alpha one Screen  August 18, 2010 = MM  - See CT Chest 09/29/05 Stable bronchiectasis in right middle lobe and lingula.  Scattered tree in bud opacities throughout the lungs, right greater than left,  nonspecific.   - PFT's 06/21/2012 1.67 (91%) ratio 64 and no change,  DLCO 77% - Flutter valve added 04/27/2014  - PFTs 05/24/15   FEV1 1.55 (81%) ratio 69 p 6% improvement from saba and dlco 62% ratio 88%  - HRCT 04/04/16 >>> 1. 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 2. An incidental finding of potential clinical significance has been found. Dominant nodule in the right upper lobe, likely infectious/inflammatory in etiology.  - Zmax 250 mg daily 03/01/16 - 05/01/16 improved cough at d/c but diarrhea while on it and no change in nodule RUL > d/c 07/07/16 - 08/28/2017 added back cycles of zpak prn flare of purulent bronchitis > d/c 05/20/2018  - levaquin 500 x 10 days prn 06/11/2018 - flutter valve training 06/24/18 Chest CT 09/09/2018 Increased tree-in-bud nodularity in right lower lobe since 2016 -10/11/2020  new L lung mid nodule noted and likely inflammatory  - 01/17/2021    Am sputum samples>>>not done as of 08/01/2021 and cxr worse so rec BAL but get HRCT 1st  - 08/09/21  HRCT minimal change since 2017 so no BAL indicated   >>>  01/10/2022 reported cough worse p supper > resume gerd rx and do  MBS   I doubt she's aspirating at this point but need to be sure she's on the right diet given her poor reserve from extensive bilateral bronchiectasis   Discussed in detail all the  indications, usual  risks and alternatives  relative to the benefits with patient who agrees to proceed with w/u as outlined.            Each maintenance medication was reviewed in detail including emphasizing most importantly the  difference between maintenance and prns and under what circumstances the prns are to be triggered using an action plan format where appropriate.  Total time for H and P, chart review, counseling, reviewingf flutter device(s) and generating customized AVS unique to this office visit / same day charting  > 30 min for multiple  refractory respiratory  symptoms of uncertain etiology        >>>

## 2022-01-10 NOTE — Progress Notes (Signed)
Electrophysiology Office Note   Date:  01/10/2022   ID:  [name], DOB 26-Aug-1932, MRN [id number]  PCP:  Orpah Melter, MD  Cardiologist:  Nahser Primary Electrophysiologist:  Joshu Furukawa Meredith Leeds, MD    Chief Complaint: Syncope   History of Present Illness: [name] is a 88 y.o. female who is being seen today for the evaluation of syncope at the request of Orpah Melter, MD. Presenting today for electrophysiology evaluation.  She has a history significant for hypertension, paroxysmal atrial fibrillation, SVT, syncope.  She presented the emergency room 12/12/2019 with episodes of syncope.  She woke up with palpitations and chest tightness.  She was walking to the bathroom and woke up on the floor.  After the episode she had weakness and chest tightness.  She is now status post Linq monitor implant.  She was initially tried on diltiazem and Multaq for episodes of SVT and atrial fibrillation but did not tolerate either of these medications.  Had an emergency room 08/11/2021 with palpitations and was found to be in atrial fibrillation.  She went to the emergency room but had gone back into sinus rhythm.  She had stopped her Eliquis prior to that episode, but it was restarted in the emergency room.  She felt that she was having side effects and was started on Xarelto.  Today, denies symptoms of palpitations, chest pain, shortness of breath, orthopnea, PND, lower extremity edema, claudication, dizziness, presyncope, syncope, bleeding, or neurologic sequela. The patient is tolerating medications without difficulties.  He is to have short episodes of atrial fibrillation.  She is symptomatic from these episodes.  She was in the hospital June 2023 with diverticulitis.  She continues to feel weak and fatigued.  She is concerned that the Xarelto is causing her weakness and fatigue or at least contributing, though she felt well when starting the Xarelto prior to her hospitalization.   Past  Medical History:  Diagnosis Date   Arm numbness left    Back pain    BCC (basal cell carcinoma of skin) 03/16/1986   Left Shoulder (curet and excision)   BCC (basal cell carcinoma of skin) 03/10/1988   Mid Back (tx p bx)   BCC (basal cell carcinoma of skin) 01/28/1998   Upper Lip (curet and excision)   BCC (basal cell carcinoma of skin) 10/24/2005   Right Mid Back (curet and 5FU)   Bronchitis, chronic (HCC)    Depression    Dizziness    Fatigue    Hypertension    mild   Obstructive bronchiectasis ([place]) 08/28/2007    Followed in Pulmonary clinic/ [facility]/ [place]  Onset ? Age 70 wit bronchiectasis first confirmed 2007  - Alpha one Screen  August 18, 2010 = MM  - See CT Chest 09/29/05 Stable bronchiectasis in right middle lobe and lingula.  Scattered tree in bud opacities throughout the lungs, right greater than left,  nonspecific.   - PFT's 06/21/2012 1.67 (91%) ratio 64 and no change,  DLCO 77% - Flutte   Palpitations    SCCA (squamous cell carcinoma) of skin 05/08/2011   Left Hand (in situ) (tx p bx)   Skin cancer    Stress    Superficial basal cell carcinoma (BCC) 07/11/1995   Top Right Upper Leg (curet and 5FU)   Superficial basal cell carcinoma (BCC) 01/28/1998   Right Back, Upper Thigh (curet and cautery)   Superficial basal cell carcinoma (BCC) 02/18/2002   Upper Inner Right Leg (curet and cautery)  Superficial basal cell carcinoma (BCC) 02/18/2002   Lower Abdomen (curet and 5FU)   Superficial basal cell carcinoma (BCC) 01/27/2013   Right Inner Thigh (tx p bx)   Syncope    History of    Past Surgical History:  Procedure Laterality Date   APPENDECTOMY     CESAREAN SECTION  [phone]     Current Outpatient Medications  Medication Sig Dispense Refill   diltiazem (CARDIZEM) 30 MG tablet Take 1 tablet (30 mg total) by mouth 4 (four) times daily as needed (for elevated heart rates). 30 tablet 1   famotidine (PEPCID) 20 MG tablet One after supper      omeprazole (PRILOSEC OTC) 20 MG tablet Take 1 tablet (20 mg total) by mouth daily. Take 30-60 min before first meal of the day     Rivaroxaban (XARELTO) 15 MG TABS tablet Xarelto 15 mg tablet   1 tablet every day by oral route.     No current facility-administered medications for this visit.    Allergies:   Hydralazine hcl, Ciprofloxacin, Penicillins, Sulfonamide derivatives, Apixaban, Avelox [moxifloxacin], Band-aid infection defense [bacitracin-polymyxin b], Dome-paste bandage [wound dressings], Mannitol, Multaq [dronedarone], Nitrofurantoin monohyd macro, Other, Prednisone, Reclast [zoledronic acid], Sulfa antibiotics, and Chlorpheniramine   Social History:  The patient  reports that she has never smoked. She has never used smokeless tobacco. She reports current alcohol use. She reports that she does not use drugs.   Family History:  The patient's family history includes Alcohol abuse in her father; Atrial fibrillation in her mother; Thyroid disease in her mother.   ROS:  Please see the history of present illness.   Otherwise, review of systems is positive for none.   All other systems are reviewed and negative.   PHYSICAL EXAM: VS:  BP 114/76   Pulse 79   Ht '5\' 4"'$  (1.626 m)   Wt 122 lb 9.6 oz (55.6 kg)   SpO2 95%   BMI 21.04 kg/m  , BMI Body mass index is 21.04 kg/m. GEN: Well nourished, well developed, in no acute distress  HEENT: normal  Neck: no JVD, carotid bruits, or masses Cardiac: RRR; no murmurs, rubs, or gallops,no edema  Respiratory:  clear to auscultation bilaterally, normal work of breathing GI: soft, nontender, nondistended, + BS MS: no deformity or atrophy  Skin: warm and dry, device site well healed Neuro:  Strength and sensation are intact Psych: euthymic mood, full affect  EKG:  EKG is not ordered today. Personal review of the ekg ordered 10/29/21 shows sinus rhythm, rate 85  Personal review of the device interrogation today. Results in [place]: 10/29/2021: ALT 14 10/31/2021: BUN 10; Creatinine, Ser 0.94; Hemoglobin 11.4; Platelets 270; Potassium 4.5; Sodium 136    Lipid Panel     Component Value Date/Time   CHOL 166 01/09/2011 0455   TRIG 69 01/09/2011 0455   HDL 55 01/09/2011 0455   CHOLHDL 3.0 01/09/2011 0455   VLDL 14 01/09/2011 0455   LDLCALC 97 01/09/2011 0455     Wt Readings from Last 3 Encounters:  01/10/22 122 lb 9.6 oz (55.6 kg)  01/10/22 122 lb 6.4 oz (55.5 kg)  10/29/21 128 lb 4.9 oz (58.2 kg)      Other studies Reviewed: Additional studies/ records that were reviewed today include: TTE 10/30/2021 Review of the above records today demonstrates:   1. Left ventricular ejection fraction, by estimation, is 60 to 65%. The  left ventricle has normal function. The left ventricle has no  regional  wall motion abnormalities. Left ventricular diastolic parameters were  normal.   2. Right ventricular systolic function is normal. The right ventricular  size is normal. There is normal pulmonary artery systolic pressure.   3. The pericardial effusion is anterior to the right ventricle.   4. The mitral valve is normal in structure. No evidence of mitral valve  regurgitation. No evidence of mitral stenosis.   5. The aortic valve is tricuspid. There is mild calcification of the  aortic valve. Aortic valve regurgitation is mild. Aortic valve sclerosis  is present, with no evidence of aortic valve stenosis.   6. The inferior vena cava is dilated in size with >50% respiratory  variability, suggesting right atrial pressure of 8 mmHg.     ASSESSMENT AND PLAN:  1.  Paroxysmal atrial fibrillation/SVT: Continues to have a low burden on Linq monitor.  Currently on Xarelto 15 mg daily.  CHA2DS2-VASc of 4.  All episodes are quite short-lived, though she has had a few episodes of around 2 hours daily over the last month.  She is overall happy with her control.  She is intolerant to multiple medications.  No changes.  2.   Syncope: Post Linq monitor.  None since implant.  3.  Secondary hypercoagulable state: Currently on Xarelto for atrial fibrillation as above  Current medicines are reviewed at length with the patient today.   The patient does not have concerns regarding her medicines.  The following changes were made today: None  Labs/ tests ordered today include:  No orders of the defined types were placed in this encounter.     Disposition:   FU with Anthonia Monger 6 months  Signed, Janasha Barkalow Meredith Leeds, MD  01/10/2022 4:30 PM     [place] [address] [phone] (office) [phone] (fax)

## 2022-01-10 NOTE — Patient Instructions (Addendum)
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  - if you don't hear back from my office within one week please call us back or notify us thru MyChart and we'll address it right away   Try prilosec (omepraole)  otc '20mg'$   Take 30-60 min before first meal of the day and Pepcid ac (famotidine) 20 mg one @  bedtime until cough is completely gone for at least a week without the need for cough suppression     Please schedule a follow up visit in 3 months but call sooner if needed

## 2022-01-12 ENCOUNTER — Other Ambulatory Visit (HOSPITAL_COMMUNITY): Payer: Self-pay

## 2022-01-12 DIAGNOSIS — R059 Cough, unspecified: Secondary | ICD-10-CM

## 2022-01-12 DIAGNOSIS — R1310 Dysphagia, unspecified: Secondary | ICD-10-CM

## 2022-01-16 ENCOUNTER — Ambulatory Visit: Payer: Medicare Other

## 2022-01-17 DIAGNOSIS — R300 Dysuria: Secondary | ICD-10-CM | POA: Diagnosis not present

## 2022-01-18 LAB — CUP PACEART REMOTE DEVICE CHECK
Date Time Interrogation Session: 20230815150932
Implantable Pulse Generator Implant Date: 20210824

## 2022-01-20 DIAGNOSIS — N3001 Acute cystitis with hematuria: Secondary | ICD-10-CM | POA: Diagnosis not present

## 2022-01-20 DIAGNOSIS — R300 Dysuria: Secondary | ICD-10-CM | POA: Diagnosis not present

## 2022-01-26 ENCOUNTER — Ambulatory Visit
Admission: RE | Admit: 2022-01-26 | Discharge: 2022-01-26 | Disposition: A | Discharge status: Home / Self Care | Payer: Medicare Other | Source: Ambulatory Visit | Attending: Gastroenterology | Admitting: Gastroenterology

## 2022-01-26 DIAGNOSIS — K7689 Other specified diseases of liver: Secondary | ICD-10-CM | POA: Diagnosis not present

## 2022-01-26 DIAGNOSIS — K6389 Other specified diseases of intestine: Secondary | ICD-10-CM | POA: Diagnosis not present

## 2022-01-26 DIAGNOSIS — K5792 Diverticulitis of intestine, part unspecified, without perforation or abscess without bleeding: Secondary | ICD-10-CM | POA: Diagnosis not present

## 2022-01-26 DIAGNOSIS — R1084 Generalized abdominal pain: Secondary | ICD-10-CM

## 2022-01-26 DIAGNOSIS — K5730 Diverticulosis of large intestine without perforation or abscess without bleeding: Secondary | ICD-10-CM | POA: Diagnosis not present

## 2022-01-26 MED ORDER — IOPAMIDOL (ISOVUE-300) INJECTION 61%
75.0000 mL | Freq: Once | INTRAVENOUS | Status: AC | PRN
  Administered 2022-01-26: 75 mL via INTRAVENOUS

## 2022-01-31 DIAGNOSIS — Z1211 Encounter for screening for malignant neoplasm of colon: Secondary | ICD-10-CM | POA: Diagnosis not present

## 2022-01-31 DIAGNOSIS — K5732 Diverticulitis of large intestine without perforation or abscess without bleeding: Secondary | ICD-10-CM | POA: Diagnosis not present

## 2022-02-03 ENCOUNTER — Ambulatory Visit (HOSPITAL_COMMUNITY)
Admission: RE | Admit: 2022-02-03 | Discharge: 2022-02-03 | Disposition: A | Discharge status: Home / Self Care | Payer: Medicare Other | Source: Ambulatory Visit | Attending: Family Medicine | Admitting: Family Medicine

## 2022-02-03 ENCOUNTER — Ambulatory Visit (HOSPITAL_COMMUNITY): Payer: Medicare Other

## 2022-02-03 DIAGNOSIS — J479 Bronchiectasis, uncomplicated: Secondary | ICD-10-CM

## 2022-02-09 DIAGNOSIS — Z1231 Encounter for screening mammogram for malignant neoplasm of breast: Secondary | ICD-10-CM | POA: Diagnosis not present

## 2022-02-13 ENCOUNTER — Ambulatory Visit: Payer: Medicare Other | Admitting: Dermatology

## 2022-02-20 ENCOUNTER — Ambulatory Visit (INDEPENDENT_AMBULATORY_CARE_PROVIDER_SITE_OTHER): Payer: Medicare Other

## 2022-02-20 DIAGNOSIS — I480 Paroxysmal atrial fibrillation: Secondary | ICD-10-CM | POA: Diagnosis not present

## 2022-02-21 ENCOUNTER — Ambulatory Visit (HOSPITAL_COMMUNITY)
Admission: RE | Admit: 2022-02-21 | Discharge: 2022-02-21 | Disposition: A | Discharge status: Home / Self Care | Payer: Medicare Other | Source: Ambulatory Visit | Attending: Family Medicine | Admitting: Family Medicine

## 2022-02-21 ENCOUNTER — Encounter: Payer: Self-pay | Admitting: Internal Medicine

## 2022-02-21 DIAGNOSIS — R059 Cough, unspecified: Secondary | ICD-10-CM | POA: Diagnosis not present

## 2022-02-21 DIAGNOSIS — J479 Bronchiectasis, uncomplicated: Secondary | ICD-10-CM | POA: Diagnosis not present

## 2022-02-21 DIAGNOSIS — R1311 Dysphagia, oral phase: Secondary | ICD-10-CM | POA: Diagnosis not present

## 2022-02-21 DIAGNOSIS — R1310 Dysphagia, unspecified: Secondary | ICD-10-CM | POA: Diagnosis not present

## 2022-02-23 LAB — CUP PACEART REMOTE DEVICE CHECK
Date Time Interrogation Session: 20230918080319
Implantable Pulse Generator Implant Date: 20210824

## 2022-02-27 ENCOUNTER — Telehealth: Payer: Self-pay | Admitting: Internal Medicine

## 2022-02-27 NOTE — Telephone Encounter (Signed)
Patient requesting refill on pantoprazole sent to [facility] on [address]. [facility] pharmacy has tried to contact our office multiple times. Patient gave RX # [phone].  Please advise and call patient when rx is sent.

## 2022-02-28 MED ORDER — PANTOPRAZOLE SODIUM 40 MG PO TBEC: 40.0000 mg | DELAYED_RELEASE_TABLET | Freq: Every day | ORAL | 4 refills | Status: DC

## 2022-02-28 NOTE — Telephone Encounter (Signed)
That's fine

## 2022-02-28 NOTE — Telephone Encounter (Signed)
Spoke with pt and advised that RX for Pantoprazole has been sent to pharmacy. Pt verbalized understanding.

## 2022-02-28 NOTE — Telephone Encounter (Signed)
Patient Instructions by Tanda Rockers, MD at 01/10/2022 2:00 PM  Author: Tanda Rockers, MD Author Type: Physician Filed: 01/10/2022  2:30 PM  Note Status: Addendum Cosign: Cosign Not Required Encounter Date: 01/10/2022  Editor: Tanda Rockers, MD (Physician)      Prior Versions: 1. Tanda Rockers, MD (Physician) at 01/10/2022  2:25 PM - Signed    My office will be contacting you by phone for referral to Speech therapy for Modified barium swallow.  - if you don't hear back from my office within one week please call us back or notify us thru MyChart and we'll address it right away    Try prilosec (omepraole)  otc '20mg'$   Take 30-60 min before first meal of the day and Pepcid ac (famotidine) 20 mg one @  bedtime until cough is completely gone for at least a week without the need for cough suppression      Please schedule a follow up visit in 3 months but call sooner if needed         Dr. Melvyn Novas, please advise if you are okay with pt switching to pantoprazole instead of her doing OTC omeprazole.

## 2022-03-07 DIAGNOSIS — H903 Sensorineural hearing loss, bilateral: Secondary | ICD-10-CM | POA: Insufficient documentation

## 2022-03-07 NOTE — Progress Notes (Signed)
Carelink Summary Report / Loop Recorder 

## 2022-03-27 ENCOUNTER — Ambulatory Visit (INDEPENDENT_AMBULATORY_CARE_PROVIDER_SITE_OTHER): Payer: Medicare Other

## 2022-03-27 DIAGNOSIS — R55 Syncope and collapse: Secondary | ICD-10-CM | POA: Diagnosis not present

## 2022-03-29 LAB — CUP PACEART REMOTE DEVICE CHECK
Date Time Interrogation Session: 20231022231318
Implantable Pulse Generator Implant Date: 20210824

## 2022-04-13 ENCOUNTER — Ambulatory Visit: Payer: Medicare Other | Admitting: Internal Medicine

## 2022-04-17 ENCOUNTER — Ambulatory Visit (INDEPENDENT_AMBULATORY_CARE_PROVIDER_SITE_OTHER): Payer: Medicare Other | Admitting: Internal Medicine

## 2022-04-17 ENCOUNTER — Encounter: Payer: Self-pay | Admitting: Internal Medicine

## 2022-04-17 VITALS — BP 128/66 | HR 85 | Temp 97.5°F | Ht 64.0 in | Wt 124.4 lb

## 2022-04-17 DIAGNOSIS — J479 Bronchiectasis, uncomplicated: Secondary | ICD-10-CM

## 2022-04-17 DIAGNOSIS — R058 Other specified cough: Secondary | ICD-10-CM | POA: Diagnosis not present

## 2022-04-17 DIAGNOSIS — R1311 Dysphagia, oral phase: Secondary | ICD-10-CM

## 2022-04-17 NOTE — Assessment & Plan Note (Signed)
Onset ? Age 88 wit bronchiectasis first confirmed 2007  - Alpha one Screen  August 18, 2010 = MM  - See CT Chest 09/29/05 Stable bronchiectasis in right middle lobe and lingula.  Scattered tree in bud opacities throughout the lungs, right greater than left,  nonspecific.   - PFT's 06/21/2012 1.67 (91%) ratio 64 and no change,  DLCO 77% - Flutter valve added 04/27/2014  - PFTs 05/24/15   FEV1 1.55 (81%) ratio 69 p 6% improvement from saba and dlco 62% ratio 88%  - HRCT 04/04/16 >>> 1. 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 2. An incidental finding of potential clinical significance has been found. Dominant nodule in the right upper lobe, likely infectious/inflammatory in etiology.  - Zmax 250 mg daily 03/01/16 - 05/01/16 improved cough at d/c but diarrhea while on it and no change in nodule RUL > d/c 07/07/16 - 08/28/2017 added back cycles of zpak prn flare of purulent bronchitis > d/c 05/20/2018  - levaquin 500 x 10 days prn 06/11/2018 - flutter valve training 06/24/18 Chest CT 09/09/2018 Increased tree-in-bud nodularity in right lower lobe since 2016 -10/11/2020  new L lung mid nodule noted and likely inflammatory  - 01/17/2021    Am sputum samples>>>not done as of 08/01/2021 and cxr worse so rec BAL but get HRCT 1st  - 08/09/21  HRCT minimal change since 2017 so no BAL indicated  - 01/10/2022 reported cough worse p supper > resume gerd rx and do  MBS  (see uacs)   Symptomatically much improved on dietary restrictions and gerd rx so no changes needed

## 2022-04-17 NOTE — Assessment & Plan Note (Signed)
02/21/2022  Mild risk  recs Diet Recommendations  SLP Diet Recommendations Regular solids;Thin liquid  Liquid Administration via Straw;Cup  Medication Administration Whole meds with liquid  Compensations Slow rate;Small sips/bites  Postural Changes Remain semi-upright after after feeds/meals (Comment);Seated upright at 90 degrees    Improved on present rx/ no changes needed  F/u q 6 m, sooner prn          Each maintenance medication was reviewed in detail including emphasizing most importantly the difference between maintenance and prns and under what circumstances the prns are to be triggered using an action plan format where appropriate.  Total time for H and P, chart review, counseling,  and generating customized AVS unique to this office visit / same day charting = 23 min

## 2022-04-17 NOTE — Patient Instructions (Signed)
No change in medications    Please schedule a follow up visit in 6  months but call sooner if needed  

## 2022-04-17 NOTE — Progress Notes (Signed)
Subjective:    Patient ID: [name], female    DOB: September 15, 1932    MRN: [id number]      Brief patient profile:  9 yowf never smoker/ MM   with recurrent cough since age 88 with right middle lobe/lingular syndrome with limited bronchiectatic changes on  CT study dated 09/2005      History of Present Illness   03/01/2016  f/u ov/Makenzy Krist re:  Bronchiectasis with uacs/ some better on gerd rx  Chief Complaint  Patient presents with   Follow-up    Pt states her dry cough is unchanged since last OV. Pt states she has ocassional chest tightness and SOB but attributes this to stress. Pt also c/o hoarseness.    coughs up to an hour each am, completely non-productive even with flutter  rec No change in medications Nasty mucus > doxycline 100 mg twice daily x 10 days   Late add for nodular opacity RUL c/w MAI    zmax x 250 mg daily x 30 days then recheck cxr      02/03/2020  f/u ov/Kalayah Leske re: bronchiectasis  Chief Complaint  Patient presents with   Follow-up    SOB and fatigue  Dyspnea:  Broke foot  When fell so no regular wallking now Cough: white, worse in am 's Sleeping: bed flat, 2 pillows  SABA use: none  02: none rec thyroid nodule f/u gerkin         01/10/2022  f/u ov/Sharica Roedel re: bronchiectasis  maint on flutter valve/ no longer on GERD rx with increasing dysphagia and cough worse p meals    Chief Complaint  Patient presents with   Follow-up    Pt was in hospital at end of May for Diverticulosis. Still having a productive cough with chalky white sputum.  Dyspnea:  hips walks slow with cane  Cough: white chaulky worse p supper  Sleeping: bed blocks s resp cc  SABA use: none  02: none  Rec  Try prilosec (omepraole)  otc '20mg'$   Take 30-60 min before first meal of the day and Pepcid ac (famotidine) 20 mg one @  bedtime until cough is completely gone for at least a week without the need for cough suppression       04/17/2022  f/u ov/Angelis Gates re: bronchiectasis maint on gerd rx  / Schooler / ST rx > better  Chief Complaint  Patient presents with   Follow-up    Cough and clearing throat;  sx worse qam.   Dyspnea:  hips no longer slowing her down  Cough: worse first thing in am white mucus  Sleeping: bed blocks 2 pillows SABA use: none  02: none     No obvious day to day or daytime variability or assoc excess/ purulent sputum or mucus plugs or hemoptysis or cp or chest tightness, subjective wheeze or overt sinus or hb symptoms.   Sleeping  without nocturnal  or early am exacerbation  of respiratory  c/o's or need for noct saba. Also denies any obvious fluctuation of symptoms with weather or environmental changes or other aggravating or alleviating factors except as outlined above   No unusual exposure hx or h/o childhood pna/ asthma or knowledge of premature birth.  Current Allergies, Complete Past Medical History, Past Surgical History, Family History, and Social History were reviewed in [employer] record.  ROS  The following are not active complaints unless bolded Hoarseness, sore throat, dysphagia, dental problems, itching, sneezing,  nasal congestion or discharge of excess  mucus or purulent secretions, ear ache,   fever, chills, sweats, unintended wt loss or wt gain, classically pleuritic or exertional cp,  orthopnea pnd or arm/hand swelling  or leg swelling, presyncope, palpitations, abdominal pain, anorexia, nausea, vomiting, diarrhea  or change in bowel habits or change in bladder habits, change in stools or change in urine, dysuria, hematuria,  rash, arthralgias, visual complaints, headache, numbness, weakness or ataxia or problems with walking or coordination,  change in mood or  memory.        Current Meds  Medication Sig   diltiazem (CARDIZEM) 30 MG tablet Take 1 tablet (30 mg total) by mouth 4 (four) times daily as needed (for elevated heart rates).   famotidine (PEPCID) 20 MG tablet One after supper   HYDROcodone-acetaminophen  (NORCO/VICODIN) 5-325 MG tablet Take 1 tablet by mouth 2 (two) times daily as needed.   Misc. Devices MISC by Does not apply route. Flutter valve when needed for cough   pantoprazole (PROTONIX) 40 MG tablet Take 1 tablet (40 mg total) by mouth daily.               Past Medical History:  BRONCHIECTASIS (ICD-494.0) see CT scan of the chest dated 09/29/2005  - Pneumovax April 28, 2009 (over 65 so last one)  Prevnar given 04/27/2014   - Alpha one Screen  August 18, 2010 = MM  - Sinus CT August 18, 2010 >>  neg  COUGH, CHRONIC (ICD-786.2)  Left Kidney Cyst  - 04/21/09 [facility] PROCEDURE(S): ULTRASOUND GUIDED AND FLUOROSCOPIC GUIDED LEFT RENAL CYST ASPIRATION AND SCLEROSIS  LUQ Abd pain 2011...............................................................Marland KitchenSchooler  - CT ABD 07/29/10 no etiology        Objective:   Physical Exam  Wt  04/17/2022        124  01/10/2022           122  08/01/2021         126  01/17/2021         121  10/11/2020           118 08/02/2020         126   02/03/2020        124  Wt  09/19/2019  120  07/22/2018         122  05/20/2018       129  11/30/2017         131  March 15,2012 137       Vital signs reviewed  04/17/2022  - Note at rest 02 sats  97% on RA   General appearance:    think elderly wf nad   HEENT : Oropharynx  clear      Nasal turbinates nl    NECK :  without  apparent JVD/ palpable Nodes/TM    LUNGS: no acc muscle use,  Nl contour chest  with insp squeaks/ pops both bases    without cough on insp or exp maneuvers   CV:  RRR  no s3 or murmur or increase in P2, and no edema   ABD:  soft and nontender with nl inspiratory excursion in the supine position. No bruits or organomegaly appreciated   MS:  Nl gait/ ext warm without deformities Or obvious joint restrictions  calf tenderness, cyanosis or clubbing    SKIN: warm and dry without lesions    NEURO:  alert, approp, nl sensorium with  no motor or cerebellar deficits apparent.  Assessment & Plan:

## 2022-04-18 DIAGNOSIS — M79671 Pain in right foot: Secondary | ICD-10-CM | POA: Diagnosis not present

## 2022-04-18 DIAGNOSIS — L03031 Cellulitis of right toe: Secondary | ICD-10-CM | POA: Diagnosis not present

## 2022-04-18 DIAGNOSIS — B351 Tinea unguium: Secondary | ICD-10-CM | POA: Diagnosis not present

## 2022-04-18 DIAGNOSIS — L03032 Cellulitis of left toe: Secondary | ICD-10-CM | POA: Diagnosis not present

## 2022-04-18 DIAGNOSIS — M79672 Pain in left foot: Secondary | ICD-10-CM | POA: Diagnosis not present

## 2022-04-18 DIAGNOSIS — L600 Ingrowing nail: Secondary | ICD-10-CM | POA: Diagnosis not present

## 2022-04-26 NOTE — Progress Notes (Signed)
Carelink Summary Report / Loop Recorder 

## 2022-05-01 ENCOUNTER — Ambulatory Visit (INDEPENDENT_AMBULATORY_CARE_PROVIDER_SITE_OTHER): Payer: Medicare Other

## 2022-05-01 DIAGNOSIS — R55 Syncope and collapse: Secondary | ICD-10-CM

## 2022-05-02 DIAGNOSIS — I10 Essential (primary) hypertension: Secondary | ICD-10-CM | POA: Diagnosis not present

## 2022-05-02 DIAGNOSIS — L299 Pruritus, unspecified: Secondary | ICD-10-CM | POA: Diagnosis not present

## 2022-05-02 DIAGNOSIS — G894 Chronic pain syndrome: Secondary | ICD-10-CM | POA: Diagnosis not present

## 2022-05-02 LAB — CUP PACEART REMOTE DEVICE CHECK
Date Time Interrogation Session: 20231126231924
Implantable Pulse Generator Implant Date: 20210824

## 2022-05-09 DIAGNOSIS — H26493 Other secondary cataract, bilateral: Secondary | ICD-10-CM | POA: Diagnosis not present

## 2022-05-09 DIAGNOSIS — H04123 Dry eye syndrome of bilateral lacrimal glands: Secondary | ICD-10-CM | POA: Diagnosis not present

## 2022-05-09 DIAGNOSIS — Z961 Presence of intraocular lens: Secondary | ICD-10-CM | POA: Diagnosis not present

## 2022-05-09 DIAGNOSIS — H5712 Ocular pain, left eye: Secondary | ICD-10-CM | POA: Diagnosis not present

## 2022-06-01 DIAGNOSIS — M79675 Pain in left toe(s): Secondary | ICD-10-CM | POA: Diagnosis not present

## 2022-06-01 DIAGNOSIS — L600 Ingrowing nail: Secondary | ICD-10-CM | POA: Diagnosis not present

## 2022-06-01 DIAGNOSIS — B351 Tinea unguium: Secondary | ICD-10-CM | POA: Diagnosis not present

## 2022-06-01 DIAGNOSIS — M79674 Pain in right toe(s): Secondary | ICD-10-CM | POA: Diagnosis not present

## 2022-06-06 ENCOUNTER — Ambulatory Visit (INDEPENDENT_AMBULATORY_CARE_PROVIDER_SITE_OTHER): Payer: Medicare Other

## 2022-06-06 DIAGNOSIS — R55 Syncope and collapse: Secondary | ICD-10-CM | POA: Diagnosis not present

## 2022-06-06 LAB — CUP PACEART REMOTE DEVICE CHECK
Date Time Interrogation Session: 20240101230905
Implantable Pulse Generator Implant Date: 20210824

## 2022-06-13 DIAGNOSIS — I10 Essential (primary) hypertension: Secondary | ICD-10-CM | POA: Diagnosis not present

## 2022-06-13 DIAGNOSIS — I4811 Longstanding persistent atrial fibrillation: Secondary | ICD-10-CM | POA: Diagnosis not present

## 2022-06-13 DIAGNOSIS — M810 Age-related osteoporosis without current pathological fracture: Secondary | ICD-10-CM | POA: Diagnosis not present

## 2022-06-13 DIAGNOSIS — K219 Gastro-esophageal reflux disease without esophagitis: Secondary | ICD-10-CM | POA: Diagnosis not present

## 2022-06-13 DIAGNOSIS — G894 Chronic pain syndrome: Secondary | ICD-10-CM | POA: Diagnosis not present

## 2022-06-13 NOTE — Progress Notes (Signed)
Carelink Summary Report / Loop Recorder 

## 2022-06-16 DIAGNOSIS — R102 Pelvic and perineal pain: Secondary | ICD-10-CM | POA: Diagnosis not present

## 2022-06-16 DIAGNOSIS — N281 Cyst of kidney, acquired: Secondary | ICD-10-CM | POA: Diagnosis not present

## 2022-07-01 ENCOUNTER — Other Ambulatory Visit: Payer: Self-pay

## 2022-07-01 ENCOUNTER — Emergency Department (HOSPITAL_COMMUNITY): Payer: Medicare Other

## 2022-07-01 ENCOUNTER — Observation Stay (HOSPITAL_COMMUNITY): Payer: Medicare Other

## 2022-07-01 ENCOUNTER — Observation Stay (HOSPITAL_COMMUNITY)
Admission: EM | Admit: 2022-07-01 | Discharge: 2022-07-02 | Disposition: A | Discharge status: Home / Self Care | Payer: Medicare Other | Attending: Family Medicine | Admitting: Family Medicine

## 2022-07-01 ENCOUNTER — Encounter (HOSPITAL_COMMUNITY): Payer: Self-pay

## 2022-07-01 DIAGNOSIS — R55 Syncope and collapse: Principal | ICD-10-CM | POA: Diagnosis present

## 2022-07-01 DIAGNOSIS — I6381 Other cerebral infarction due to occlusion or stenosis of small artery: Secondary | ICD-10-CM | POA: Diagnosis not present

## 2022-07-01 DIAGNOSIS — R0902 Hypoxemia: Secondary | ICD-10-CM | POA: Diagnosis not present

## 2022-07-01 DIAGNOSIS — R42 Dizziness and giddiness: Secondary | ICD-10-CM | POA: Diagnosis not present

## 2022-07-01 DIAGNOSIS — D72829 Elevated white blood cell count, unspecified: Secondary | ICD-10-CM | POA: Diagnosis present

## 2022-07-01 DIAGNOSIS — I4891 Unspecified atrial fibrillation: Secondary | ICD-10-CM

## 2022-07-01 DIAGNOSIS — N179 Acute kidney failure, unspecified: Secondary | ICD-10-CM | POA: Diagnosis present

## 2022-07-01 DIAGNOSIS — Z85828 Personal history of other malignant neoplasm of skin: Secondary | ICD-10-CM | POA: Diagnosis not present

## 2022-07-01 DIAGNOSIS — K5732 Diverticulitis of large intestine without perforation or abscess without bleeding: Secondary | ICD-10-CM | POA: Diagnosis not present

## 2022-07-01 DIAGNOSIS — S199XXA Unspecified injury of neck, initial encounter: Secondary | ICD-10-CM | POA: Diagnosis not present

## 2022-07-01 DIAGNOSIS — I480 Paroxysmal atrial fibrillation: Secondary | ICD-10-CM | POA: Diagnosis not present

## 2022-07-01 DIAGNOSIS — N182 Chronic kidney disease, stage 2 (mild): Secondary | ICD-10-CM | POA: Diagnosis not present

## 2022-07-01 DIAGNOSIS — R2681 Unsteadiness on feet: Secondary | ICD-10-CM | POA: Diagnosis not present

## 2022-07-01 DIAGNOSIS — I959 Hypotension, unspecified: Secondary | ICD-10-CM | POA: Diagnosis not present

## 2022-07-01 DIAGNOSIS — M160 Bilateral primary osteoarthritis of hip: Secondary | ICD-10-CM | POA: Diagnosis not present

## 2022-07-01 DIAGNOSIS — R000 Tachycardia, unspecified: Secondary | ICD-10-CM | POA: Diagnosis not present

## 2022-07-01 DIAGNOSIS — R109 Unspecified abdominal pain: Secondary | ICD-10-CM | POA: Insufficient documentation

## 2022-07-01 DIAGNOSIS — J479 Bronchiectasis, uncomplicated: Secondary | ICD-10-CM | POA: Diagnosis not present

## 2022-07-01 DIAGNOSIS — Z7901 Long term (current) use of anticoagulants: Secondary | ICD-10-CM | POA: Diagnosis not present

## 2022-07-01 LAB — CBC WITH DIFFERENTIAL/PLATELET
Abs Immature Granulocytes: 0.23 10*3/uL — ABNORMAL HIGH (ref 0.00–0.07)
Basophils Absolute: 0.1 10*3/uL (ref 0.0–0.1)
Basophils Relative: 0 %
Eosinophils Absolute: 0 10*3/uL (ref 0.0–0.5)
Eosinophils Relative: 0 %
HCT: 45 % (ref 36.0–46.0)
Hemoglobin: 14.7 g/dL (ref 12.0–15.0)
Immature Granulocytes: 1 %
Lymphocytes Relative: 4 %
Lymphs Abs: 0.7 10*3/uL (ref 0.7–4.0)
MCH: 30.6 pg (ref 26.0–34.0)
MCHC: 32.7 g/dL (ref 30.0–36.0)
MCV: 93.6 fL (ref 80.0–100.0)
Monocytes Absolute: 1 10*3/uL (ref 0.1–1.0)
Monocytes Relative: 5 %
Neutro Abs: 18.5 10*3/uL — ABNORMAL HIGH (ref 1.7–7.7)
Neutrophils Relative %: 90 %
Platelets: 296 10*3/uL (ref 150–400)
RBC: 4.81 MIL/uL (ref 3.87–5.11)
RDW: 13.3 % (ref 11.5–15.5)
WBC: 20.5 10*3/uL — ABNORMAL HIGH (ref 4.0–10.5)
nRBC: 0 % (ref 0.0–0.2)

## 2022-07-01 LAB — COMPREHENSIVE METABOLIC PANEL
ALT: 14 U/L (ref 0–44)
AST: 37 U/L (ref 15–41)
Albumin: 3.3 g/dL — ABNORMAL LOW (ref 3.5–5.0)
Alkaline Phosphatase: 53 U/L (ref 38–126)
Anion gap: 13 (ref 5–15)
BUN: 23 mg/dL (ref 8–23)
CO2: 21 mmol/L — ABNORMAL LOW (ref 22–32)
Calcium: 9 mg/dL (ref 8.9–10.3)
Chloride: 106 mmol/L (ref 98–111)
Creatinine, Ser: 1.56 mg/dL — ABNORMAL HIGH (ref 0.44–1.00)
GFR, Estimated: 32 mL/min — ABNORMAL LOW (ref >60)
Glucose, Bld: 104 mg/dL — ABNORMAL HIGH (ref 70–99)
Potassium: 4.7 mmol/L (ref 3.5–5.1)
Sodium: 140 mmol/L (ref 135–145)
Total Bilirubin: 0.8 mg/dL (ref 0.3–1.2)
Total Protein: 6.2 g/dL — ABNORMAL LOW (ref 6.5–8.1)

## 2022-07-01 LAB — MAGNESIUM: Magnesium: 2.1 mg/dL (ref 1.7–2.4)

## 2022-07-01 LAB — CK TOTAL AND CKMB (NOT AT ARMC)
CK, MB: 5 ng/mL (ref 0.5–5.0)
Relative Index: 2.2 (ref 0.0–2.5)
Total CK: 231 U/L (ref 38–234)

## 2022-07-01 MED ORDER — SODIUM CHLORIDE 0.9 % IV BOLUS
1000.0000 mL | Freq: Once | INTRAVENOUS | Status: AC
  Administered 2022-07-01: 1000 mL via INTRAVENOUS

## 2022-07-01 MED ORDER — PANTOPRAZOLE SODIUM 40 MG PO TBEC
40.0000 mg | DELAYED_RELEASE_TABLET | Freq: Every day | ORAL | Status: DC
  Filled 2022-07-01: qty 1

## 2022-07-01 MED ORDER — DILTIAZEM HCL 30 MG PO TABS
30.0000 mg | ORAL_TABLET | ORAL | Status: AC
  Administered 2022-07-01: 30 mg via ORAL
  Filled 2022-07-01: qty 1

## 2022-07-01 MED ORDER — RIVAROXABAN 15 MG PO TABS: 15.0000 mg | ORAL_TABLET | Freq: Every day | ORAL | Status: DC

## 2022-07-01 MED ORDER — CICLOPIROX 8 % EX SOLN: 1.0000 | Freq: Every day | CUTANEOUS | Status: DC

## 2022-07-01 MED ORDER — DILTIAZEM HCL 25 MG/5ML IV SOLN
20.0000 mg | Freq: Once | INTRAVENOUS | Status: AC
  Administered 2022-07-01: 20 mg via INTRAVENOUS
  Filled 2022-07-01: qty 5

## 2022-07-01 MED ORDER — ACETAMINOPHEN 325 MG PO TABS: 650.0000 mg | ORAL_TABLET | Freq: Four times a day (QID) | ORAL | Status: DC | PRN

## 2022-07-01 MED ORDER — SODIUM CHLORIDE 0.9 % IV SOLN: INTRAVENOUS | Status: AC

## 2022-07-01 MED ORDER — ONDANSETRON HCL 4 MG/2ML IJ SOLN: 4.0000 mg | Freq: Four times a day (QID) | INTRAMUSCULAR | Status: DC | PRN

## 2022-07-01 MED ORDER — ACETAMINOPHEN 650 MG RE SUPP: 650.0000 mg | Freq: Four times a day (QID) | RECTAL | Status: DC | PRN

## 2022-07-01 MED ORDER — SENNOSIDES-DOCUSATE SODIUM 8.6-50 MG PO TABS: 1.0000 | ORAL_TABLET | Freq: Every evening | ORAL | Status: DC | PRN

## 2022-07-01 MED ORDER — SODIUM CHLORIDE 0.9% FLUSH: 3.0000 mL | Freq: Two times a day (BID) | INTRAVENOUS | Status: DC

## 2022-07-01 MED ORDER — ONDANSETRON HCL 4 MG PO TABS: 4.0000 mg | ORAL_TABLET | Freq: Four times a day (QID) | ORAL | Status: DC | PRN

## 2022-07-01 MED ORDER — OXYCODONE HCL 5 MG PO TABS: 5.0000 mg | ORAL_TABLET | ORAL | Status: DC | PRN

## 2022-07-01 MED ORDER — ACETAMINOPHEN 500 MG PO TABS
1000.0000 mg | ORAL_TABLET | ORAL | Status: AC
  Administered 2022-07-01: 1000 mg via ORAL
  Filled 2022-07-01: qty 2

## 2022-07-01 NOTE — Consult Note (Signed)
Cardiology Consult   Patient ID: [name] MRN: [id number]; DOB: Rita 23, 1934  Admit date: 07/01/2022 Date of Consult: 07/01/2022  PCP:  Breedlove, Brent C, [place] [facility] Cardiologist:  Mertie Moores, MD  Electrophysiologist:  Constance Haw, MD       Patient Profile:   [name] is a 89 y.o. female with a hx of hypertension, paroxysmal atrial fibrillation, SVT, syncope, chronic bronchitis, who is being seen 07/01/2022 for the evaluation of syncope at the request of Dr Philip Aspen.  History of Present Illness:   Ms. Heumann was feeling weak this am, had not eaten much yesterday. Felt her heart racing. She took Dilt for the elevated HR.   She felt dizzy. Came downstairs about 9:30, then felt urge to have a BM.   She woke up on the bathroom floor. She had probably passed out in the kitchen, but does not remember this.   She had made it to the bathroom, but was incontinent of bowel.  She had a scrape on her elbow.   She was still very weak, but was able to get back to the kitchen.  She was able hit the alarm and was able to talk to EMS.   EMS gave her IVF, reporting hypotension.     Past Medical History:  Diagnosis Date   Arm numbness left    Back pain    BCC (basal cell carcinoma of skin) 03/16/1986   Left Shoulder (curet and excision)   BCC (basal cell carcinoma of skin) 03/10/1988   Mid Back (tx p bx)   BCC (basal cell carcinoma of skin) 01/28/1998   Upper Lip (curet and excision)   BCC (basal cell carcinoma of skin) 10/24/2005   Right Mid Back (curet and 5FU)   Bronchitis, chronic (HCC)    Depression    Dizziness    Fatigue    Hypertension    mild   Obstructive bronchiectasis ([place]) 08/28/2007    Followed in Pulmonary clinic/ [facility]/ [place]  Onset ? Age 23 wit bronchiectasis first confirmed 2007  - Alpha one Screen  August 18, 2010 = MM  - See CT Chest 09/29/05 Stable bronchiectasis in right middle lobe and  lingula.  Scattered tree in bud opacities throughout the lungs, right greater than left,  nonspecific.   - PFT's 06/21/2012 1.67 (91%) ratio 64 and no change,  DLCO 77% - [place]   Palpitations    SCCA (squamous cell carcinoma) of skin 05/08/2011   Left Hand (in situ) (tx p bx)   Skin cancer    Stress    Superficial basal cell carcinoma (BCC) 07/11/1995   Top Right Upper Leg (curet and 5FU)   Superficial basal cell carcinoma (BCC) 01/28/1998   Right Back, Upper Thigh (curet and cautery)   Superficial basal cell carcinoma (BCC) 02/18/2002   Upper Inner Right Leg (curet and cautery)   Superficial basal cell carcinoma (BCC) 02/18/2002   Lower Abdomen (curet and 5FU)   Superficial basal cell carcinoma (BCC) 01/27/2013   Right Inner Thigh (tx p bx)   Syncope    History of     Past Surgical History:  Procedure Laterality Date   APPENDECTOMY     CESAREAN SECTION  [phone]     Home Medications:  Prior to Admission medications   Medication Sig Start Date End Date Taking? Authorizing Provider  diltiazem (CARDIZEM) 30 MG tablet Take 1 tablet (30 mg total) by mouth 4 (four) times daily as needed (for  elevated heart rates). 08/22/21   Camnitz, Will Hassell Done, MD  famotidine (PEPCID) 20 MG tablet One after supper 01/10/22   Tanda Rockers, MD  HYDROcodone-acetaminophen (NORCO/VICODIN) 5-325 MG tablet Take 1 tablet by mouth 2 (two) times daily as needed. 04/07/22   [provider]  Misc. Devices MISC by Does not apply route. Flutter valve when needed for cough    [provider]  omeprazole (PRILOSEC OTC) 20 MG tablet Take 1 tablet (20 mg total) by mouth daily. Take 30-60 min before first meal of the day Patient not taking: Reported on 04/17/2022 01/10/22   Tanda Rockers, MD  pantoprazole (PROTONIX) 40 MG tablet Take 1 tablet (40 mg total) by mouth daily. 02/28/22   Tanda Rockers, MD  Rivaroxaban (XARELTO) 15 MG TABS tablet Xarelto 15 mg tablet   1 tablet every day by oral  route. Patient not taking: Reported on 04/17/2022    [provider]    Inpatient Medications: Scheduled Meds:  acetaminophen  1,000 mg Oral STAT   diltiazem  30 mg Oral STAT   Continuous Infusions:  PRN Meds:   Allergies:    Allergies  Allergen Reactions   Hydralazine Hcl Shortness Of Breath    With throat numbness/tingling and swelling   Ciprofloxacin Other (See Comments)    triggered a migraine that was thought to be a TIA   Penicillins Rash    Has patient had a PCN reaction causing immediate rash, facial/tongue/throat swelling, SOB or lightheadedness with hypotension: Yes Has patient had a PCN reaction causing severe rash involving mucus membranes or skin necrosis: No Has patient had a PCN reaction that required hospitalization: No Has patient had a PCN reaction occurring within the last 10 years: No If all of the above answers are "NO", then may proceed with Cephalosporin use.   Sulfonamide Derivatives Swelling   Apixaban Other (See Comments)    Other reaction(s): weakness   Avelox [Moxifloxacin] Other (See Comments)    Rash and severe tingling/numbness    Band-Aid Infection Defense [Bacitracin-Polymyxin B] Other (See Comments)    unknown   Dome-Paste Bandage [Wound Dressings] Hives   Mannitol    Multaq [Dronedarone]     Muscle aches   Nitrofurantoin Monohyd Macro     Other reaction(s): due to lung disease   Other Other (See Comments)    Water for injection, sterile - unknown   Prednisone     Other reaction(s): rash   Reclast [Zoledronic Acid] Other (See Comments)    hypertension   Sulfa Antibiotics Other (See Comments)    unknown   Chlorpheniramine Other (See Comments)    Headache, stomach ache, bladder "not working well"    Social History:   Social History   Socioeconomic History   Marital status: Married    Spouse name: Not on file   Number of children: Not on file   Years of education: Not on file   Highest education level: Not on file   Occupational History   Not on file  Tobacco Use   Smoking status: Never   Smokeless tobacco: Never  Vaping Use   Vaping Use: Never used  Substance and Sexual Activity   Alcohol use: Yes    Comment: social ETOH   Drug use: No   Sexual activity: Not on file  Other Topics Concern   Not on file  Social History Narrative   Not on file   Social Determinants of Health   Financial Resource Strain: Not on file  Food Insecurity: Not on file  Transportation Needs: Not on file  Physical Activity: Not on file  Stress: Not on file  Social Connections: Not on file  Intimate Partner Violence: Not on file    Family History:   Family History  Problem Relation Age of Onset   Atrial fibrillation Mother    Thyroid disease Mother    Alcohol abuse Father      ROS:  Please see the history of present illness.  All other ROS reviewed and negative.     Physical Exam/Data:   Vitals:   07/01/22 1432 07/01/22 1500 07/01/22 1542  BP: 138/89 (!) 136/90 126/73  Pulse: (!) 128 (!) 138 99  Resp: 20 20   Temp: (!) 97.3 F (36.3 C)  97.8 F (36.6 C)  TempSrc: Oral  Oral  SpO2: 99% 94% 94%   No intake or output data in the 24 hours ending 07/01/22 1653    04/17/2022    3:17 PM 01/10/2022    4:01 PM 01/10/2022    2:13 PM  Last 3 Weights  Weight (lbs) 124 lb 6.4 oz 122 lb 9.6 oz 122 lb 6.4 oz  Weight (kg) 56.427 kg 55.611 kg 55.52 kg     There is no height or weight on file to calculate BMI.  General:  Well nourished, well developed, in no acute distress HEENT: normal Neck: no JVD Vascular: No carotid bruits; Distal pulses 2+ bilaterally Cardiac:  normal S1, S2; RRR; no murmur  Lungs:  clear to auscultation bilaterally, no wheezing, rhonchi or rales  Abd: soft, nontender, no hepatomegaly  Ext: no edema Musculoskeletal:  No deformities, BUE and BLE strength normal and equal Skin: warm and dry  Neuro:  CNs 2-12 intact, no focal abnormalities noted Psych:  Normal affect   EKG:  The EKG  was personally reviewed and demonstrates:  Initial ECG is Atrial fib, HR 137 Repeat ECG is SR, HR 87, PR 231 ms (longer than previous) Telemetry:  Telemetry was personally reviewed and demonstrates:  rapid atrial fib >> SR  Relevant CV Studies:  ECHO: 10/30/2021  1. Left ventricular ejection fraction, by estimation, is 60 to 65%. The  left ventricle has normal function. The left ventricle has no regional  wall motion abnormalities. Left ventricular diastolic parameters were  normal.   2. Right ventricular systolic function is normal. The right ventricular  size is normal. There is normal pulmonary artery systolic pressure.   3. The pericardial effusion is anterior to the right ventricle.   4. The mitral valve is normal in structure. No evidence of mitral valve  regurgitation. No evidence of mitral stenosis.   5. The aortic valve is tricuspid. There is mild calcification of the  aortic valve. Aortic valve regurgitation is mild. Aortic valve sclerosis  is present, with no evidence of aortic valve stenosis.   6. The inferior vena cava is dilated in size with >50% respiratory  variability, suggesting right atrial pressure of 8 mmHg.   Laboratory Data:  High Sensitivity Troponin:  No results for input(s): "TROPONINIHS" in the last 720 hours.   Chemistry Recent Labs  Lab 07/01/22 1439  NA 140  K 4.7  CL 106  CO2 21*  GLUCOSE 104*  BUN 23  CREATININE 1.56*  CALCIUM 9.0  MG 2.1  GFRNONAA 32*  ANIONGAP 13    Recent Labs  Lab 07/01/22 1439  PROT 6.2*  ALBUMIN 3.3*  AST 37  ALT 14  ALKPHOS 53  BILITOT 0.8  Lipids No results for input(s): "CHOL", "TRIG", "HDL", "LABVLDL", "LDLCALC", "CHOLHDL" in the last 168 hours.  Hematology Recent Labs  Lab 07/01/22 1439  WBC 20.5*  RBC 4.81  HGB 14.7  HCT 45.0  MCV 93.6  MCH 30.6  MCHC 32.7  RDW 13.3  PLT 296   Thyroid No results for input(s): "TSH", "FREET4" in the last 168 hours.  BNPNo results for input(s): "BNP",  "PROBNP" in the last 168 hours.  DDimer No results for input(s): "DDIMER" in the last 168 hours.   Radiology/Studies:  DG Pelvis Portable  Result Date: 07/01/2022 CLINICAL DATA:  syncope? EXAM: PORTABLE PELVIS 1-2 VIEWS COMPARISON:  None Available. FINDINGS: The cortical margins of the bony pelvis are intact. No fracture. Pubic symphysis and sacroiliac joints are congruent. Faint pubic symphyseal chondrocalcinosis. Both femoral heads are well-seated in the respective acetabula. Mild to moderate bilateral hip osteoarthritis with joint space narrowing and spurring. Soft tissue calcification is seen superior to the right greater trochanter. IMPRESSION: 1. No pelvic fracture. 2. Mild to moderate bilateral hip osteoarthritis. 3. Soft tissue calcification superior to the right greater trochanter may represent calcific tendinopathy. Electronically Signed   By: Keith Rake M.D.   On: 07/01/2022 15:15   DG Chest Port 1 View  Result Date: 07/01/2022 CLINICAL DATA:  syncope? EXAM: PORTABLE CHEST 1 VIEW COMPARISON:  Chest radiograph 10/29/2021, CT 08/09/2021 FINDINGS: The cardiomediastinal contours are normal. Left chest wall loop recorder. Pulmonary vasculature is normal. Chronic areas of nodularity and lung scarring, bronchiectasis on prior CT. No acute consolidation, pleural effusion, or pneumothorax. No acute osseous abnormalities are seen. IMPRESSION: 1. No acute abnormality. 2. Chronic areas of nodularity and lung scarring corresponding to bronchiectasis on prior CT. Electronically Signed   By: Keith Rake M.D.   On: 07/01/2022 15:14     Assessment and Plan:   Syncope - unclear cause - pt initially in rapid atrial fib >> SR - pt also orthostatic, sx improved w/ IVF - also w/ elevated WBCs, ?infection - f/u on other labs and device interrogation - if HR is not low, unlikely this is a primary cardiac event.  2. Dehydration, elevated WBCs and weakness >> per [facility]   Risk Assessment/Risk  Scores:        CHA2DS2-VASc Score = 6   This indicates a 9.7% annual risk of stroke. The patient's score is based upon: CHF History: 0 HTN History: 1 Diabetes History: 0 Stroke History: 2 Vascular Disease History: 0 Age Score: 2 Gender Score: 1    For questions or updates, please contact [place] Please consult [email or web address] for contact info under    Signed, Rosaria Ferries, PA-C  07/01/2022 4:53 PM As above, patient seen and examined.  Briefly she is an 89 year old female with a past medical history of hypertension, paroxysmal atrial fibrillation, SVT, syncope for evaluation of paroxysmal atrial fibrillation and recurrent syncope.  Note patient has had an implantable loop monitor placed previously.  Patient states she typically does not have dyspnea on exertion, orthopnea, PND, pedal edema, exertional chest pain.  This morning she awoke with palpitations.  She took Cardizem (she takes this as needed).  She also took a Protonix.  She stood to walk downstairs and noticed that she was dizzy.  She apparently fell in the kitchen but has no recollection of falling.  No preceding chest pain, palpitations, nausea or dyspnea.  She then went to the bathroom and apparently had fecal incontinence.  EMS brought the patient to the emergency room  and cardiology now asked to evaluate.  She states she feels well now. On exam patient has a bandage in place on her right elbow from fall. Creatinine is 1.56, potassium 4.7, white blood cell count 20.5, hemoglobin 14.7.  Initial electrocardiogram showed probable atrial flutter with right axis deviation.  Review of telemetry shows atrial flutter converting to sinus rhythm.  1 syncope-etiology unclear.  Episode seems prolonged.  She does have an implantable loop monitor in place and we will have the device interrogated tomorrow morning.  Note her LV function was normal on echocardiogram performed Oct 30, 2021.  Also note head CT is pending though no  evidence of trauma on exam.  2 paroxysmal atrial fibrillation/flutter-patient developed recurrent atrial flutter this morning.  She took her Cardizem and is now in sinus rhythm.  Will continue Xarelto.  Question need for antiarrhythmic.  Will have Dr. Curt Bears review tomorrow morning.  3 elevated white blood cell count-patient denies infectious symptoms.  Further evaluation per primary service.  4 acute kidney injury-creatinine 1.56.  Question contribution from dehydration.  Would hydrate with normal saline and follow.  5 hypertension-follow blood pressure and adjust medications as needed.  Kirk Ruths, MD

## 2022-07-01 NOTE — ED Notes (Signed)
RN attempted to get urine sample using using hat but samiple contaminated with black dirt.  v

## 2022-07-01 NOTE — ED Provider Notes (Signed)
[facility] Provider Note   CSN: [id number] Arrival date & time: 07/01/22  1418     History  Chief Complaint  Patient presents with   Loss of Consciousness    [name] is a 89 y.o. female.  89 yo F with a chief paints of a syncopal event.  The patient tells me that she did not have much to eat yesterday she woke up this morning and felt like her heart was racing.  She took a dose of her diltiazem and then went to the kitchen.  She felt a bit dizzy when she was walking there and so she stopped to take a break and then suddenly felt like she needed to move her bowels.  She then went to the bathroom and woke up on the floor with stool on the ground.  She denied any chest pain denied abdominal pain denied headache or neck pain.  She now feels a bit better.  Was given a bolus of IV fluids with EMS.  Initially reported be hypotensive with EMS.  In A-fib with rapid rates.  En route with EMS she did note that her groin was painful bilaterally.   Loss of Consciousness      Home Medications Prior to Admission medications   Medication Sig Start Date End Date Taking? Authorizing Provider  diltiazem (CARDIZEM) 30 MG tablet Take 1 tablet (30 mg total) by mouth 4 (four) times daily as needed (for elevated heart rates). 08/22/21   Camnitz, Will Hassell Done, MD  famotidine (PEPCID) 20 MG tablet One after supper 01/10/22   Tanda Rockers, MD  HYDROcodone-acetaminophen (NORCO/VICODIN) 5-325 MG tablet Take 1 tablet by mouth 2 (two) times daily as needed. 04/07/22   [provider]  Misc. Devices MISC by Does not apply route. Flutter valve when needed for cough    [provider]  omeprazole (PRILOSEC OTC) 20 MG tablet Take 1 tablet (20 mg total) by mouth daily. Take 30-60 min before first meal of the day Patient not taking: Reported on 04/17/2022 01/10/22   Tanda Rockers, MD  pantoprazole (PROTONIX) 40 MG tablet Take 1 tablet (40 mg total)  by mouth daily. 02/28/22   Tanda Rockers, MD  Rivaroxaban (XARELTO) 15 MG TABS tablet Xarelto 15 mg tablet   1 tablet every day by oral route. Patient not taking: Reported on 04/17/2022    [provider]      Allergies    Hydralazine hcl, Ciprofloxacin, Penicillins, Sulfonamide derivatives, Apixaban, Avelox [moxifloxacin], Band-aid infection defense [bacitracin-polymyxin b], Dome-paste bandage [wound dressings], Mannitol, Multaq [dronedarone], Nitrofurantoin monohyd macro, Other, Prednisone, Reclast [zoledronic acid], Sulfa antibiotics, and Chlorpheniramine    Review of Systems   Review of Systems  Cardiovascular:  Positive for syncope.    Physical Exam Updated Vital Signs BP 138/89 (BP Location: Right Arm)   Pulse (!) 128   Temp (!) 97.3 F (36.3 C) (Oral)   Resp 20   SpO2 99%  Physical Exam Vitals and nursing note reviewed.  Constitutional:      General: She is not in acute distress.    Appearance: She is well-developed. She is not diaphoretic.  HENT:     Head: Normocephalic and atraumatic.  Eyes:     Pupils: Pupils are equal, round, and reactive to light.  Cardiovascular:     Rate and Rhythm: Tachycardia present. Rhythm irregular.     Heart sounds: No murmur heard.    No friction rub. No gallop.  Pulmonary:     Effort: Pulmonary effort is normal.     Breath sounds: No wheezing or rales.  Abdominal:     General: There is no distension.     Palpations: Abdomen is soft.     Tenderness: There is no abdominal tenderness.  Musculoskeletal:        General: No tenderness.     Cervical back: Normal range of motion and neck supple.  Skin:    General: Skin is warm and dry.  Neurological:     Mental Status: She is alert and oriented to person, place, and time.  Psychiatric:        Behavior: Behavior normal.     ED Results / Procedures / Treatments   Labs (all labs ordered are listed, but only abnormal results are displayed) Labs Reviewed  CBC WITH  DIFFERENTIAL/PLATELET  COMPREHENSIVE METABOLIC PANEL  MAGNESIUM    EKG EKG Interpretation  Date/Time:  Saturday July 01 2022 14:37:03 EST Ventricular Rate:  137 PR Interval:    QRS Duration: 90 QT Interval:  319 QTC Calculation: 482 R Axis:   99 Text Interpretation: Atrial fibrillation Right axis deviation ST depression, probably rate related Otherwise no significant change Confirmed by Deno Etienne [id number]) on 07/01/2022 2:53:06 PM  Radiology No results found.  Procedures Procedures    Medications Ordered in ED Medications  sodium chloride 0.9 % bolus 1,000 mL (has no administration in time range)  diltiazem (CARDIZEM) injection 20 mg (has no administration in time range)    ED Course/ Medical Decision Making/ A&P Clinical Course as of 07/01/22 1504  Sat Jul 01, 2022  1501 Assumed care from Dr Tyrone Nine. 89 yo F with pAF felt dizzy and took dilt. Had LOC with episode of defectating on herself. Has had decreased PO intake. Intermittently on [facility] and hasn't taking recently. Considering cardioverting her. Also has loop recorder in place. Didn't tolerate eliquis and tried xarelto and was fatigued afterwards so stopped.  [RP]    Clinical Course User Index [RP] Fransico Meadow, MD                             Medical Decision Making Amount and/or Complexity of Data Reviewed Labs: ordered. Radiology: ordered.  Risk Prescription drug management.   29 yoF with a chief complaints of a syncopal event.  Patient tells me she did not have much to eat or drink last night and then she has not had anything yet today.  When she got up this morning she realized that she was having her heart race.  She has a history of paroxysmal A-fib.  She took a diltiazem.  Patient arrives with A-fib with RVR.  Likely symptomatic with it.  Could be due to hypovolemia.  Will give a bolus of IV fluids.  Blood work.  Plain film of the pelvis if she is having some pain there.  CT of the head.  Signed out  to Dr. Sharlett Iles, please see their note for further details of care in the ED.  The patients results and plan were reviewed and discussed.   Any x-rays performed were independently reviewed by myself.   Differential diagnosis were considered with the presenting HPI.  Medications  sodium chloride 0.9 % bolus 1,000 mL (has no administration in time range)  diltiazem (CARDIZEM) injection 20 mg (has no administration in time range)    Vitals:   07/01/22 1432  BP: 138/89  Pulse: (!) 128  Resp: 20  Temp: (!) 97.3 F (36.3 C)  TempSrc: Oral  SpO2: 99%    Final diagnoses:  Atrial fibrillation with RVR (HCC)  Syncope and collapse    Admission/ observation were discussed with the admitting physician, patient and/or family and they are comfortable with the plan.          Final Clinical Impression(s) / ED Diagnoses Final diagnoses:  Atrial fibrillation with RVR ([place])  Syncope and collapse    Rx / DC Orders ED Discharge Orders     None         Deno Etienne, DO 07/01/22 1504

## 2022-07-01 NOTE — ED Triage Notes (Signed)
Pt BIB [facility] from home c/o multiple possible syncopal episodes this morning. Pt was pale and pt's legs were purple per EMS. EMS also states initially no radial pulses, once on the stretcher she regained radial pulses. Pt's initial BP was 82/50. EMS gave 500 NS. Last BP was 113/54.

## 2022-07-01 NOTE — ED Provider Notes (Signed)
  Physical Exam  BP (!) 154/86   Pulse 79   Temp (!) 97.5 F (36.4 C) (Oral)   Resp 17   SpO2 95%   Physical Exam  Procedures  Procedures  ED Course / MDM   Clinical Course as of 07/02/22 1110  Sat Jul 01, 2022  1501 Assumed care from Dr Tyrone Nine. 89 yo F with pAF felt dizzy and took dilt. Had LOC with episode of defectating on herself. Has had decreased PO intake. Intermittently on [facility] and hasn't taking recently. Considering cardioverting her. Also has loop recorder in place. Didn't tolerate eliquis and tried xarelto and was fatigued afterwards so stopped.  [RP]  1618 Patient converted to normal sinus rhythm.  On exam is overall very well-appearing with no new complaints.  Repeat EKG obtained.  Did discuss with Dr. Harl Bowie from cardiology who feels the patient should be admitted for observation as they can interrogate her loop recorder and further investigate her syncopal episode.  Given oral diltiazem. [RP]  3559 Creatinine(!): 1.56 Does appear to have AKI. [RP]  2143 Dr Myna Hidalgo from hospitalist has admitted the patient.  [RP]    Clinical Course User Index [RP] Fransico Meadow, MD   Medical Decision Making Amount and/or Complexity of Data Reviewed Labs: ordered. Decision-making details documented in ED Course. Radiology: ordered.  Risk OTC drugs. Prescription drug management. Decision regarding hospitalization.     Fransico Meadow, MD 07/02/22 1110

## 2022-07-01 NOTE — ED Notes (Signed)
MD asked if patient can eat. Waiting on reply.

## 2022-07-01 NOTE — H&P (Signed)
History and Physical    [name] [facility]:[id number] DOB: 1933/05/27 DOA: 07/01/2022  PCP: Ramiro Harvest, PA-C   Patient coming from: Home   Chief Complaint: Syncope   HPI: [name] is a 89 y.o. female with medical history significant for paroxysmal atrial fibrillation on Xarelto and bronchiectasis who presents to the emergency department for evaluation of syncope.   Patient reports that she did not eat anything after lunch yesterday but felt well when she went to bed last night.  She then woke this morning with palpitations, took a dose of diltiazem and went back to bed, but began to feel acutely lightheaded and generally unwell.  She went to the bathroom to move her bowels, had worsening in her lightheadedness and general malaise, activated her life alert button, and then woke on the floor.  ED Course: Upon arrival to the ED, patient is found to be afebrile and saturating mid 90s on room air with systolic blood pressure in the 100s.  Initial EKG demonstrates atrial fibrillation with RVR.  Plain radiographs of the chest and pelvis are negative for acute findings.  No acute abnormality noted on CT of the head and cervical spine.  Labs are most notable for creatinine 1.56 and WBC 20,500.  Patient was given 1 L normal saline, acetaminophen, IV diltiazem, and oral diltiazem in the ED.  She was evaluated by cardiology in the ED.  She converted to sinus rhythm.  Review of Systems:  All other systems reviewed and apart from HPI, are negative.  Past Medical History:  Diagnosis Date   Arm numbness left    Back pain    BCC (basal cell carcinoma of skin) 03/16/1986   Left Shoulder (curet and excision)   BCC (basal cell carcinoma of skin) 03/10/1988   Mid Back (tx p bx)   BCC (basal cell carcinoma of skin) 01/28/1998   Upper Lip (curet and excision)   BCC (basal cell carcinoma of skin) 10/24/2005   Right Mid Back (curet and 5FU)   Bronchitis, chronic (HCC)    Depression     Dizziness    Fatigue    Hypertension    mild   Obstructive bronchiectasis ([place]) 08/28/2007    Followed in Pulmonary clinic/ [facility]/ [facility]  Onset ? Age 68 wit bronchiectasis first confirmed 2007  - Alpha one Screen  August 18, 2010 = MM  - See CT Chest 09/29/05 Stable bronchiectasis in right middle lobe and lingula.  Scattered tree in bud opacities throughout the lungs, right greater than left,  nonspecific.   - PFT's 06/21/2012 1.67 (91%) ratio 64 and no change,  DLCO 77% - [place]   Palpitations    SCCA (squamous cell carcinoma) of skin 05/08/2011   Left Hand (in situ) (tx p bx)   Skin cancer    Stress    Superficial basal cell carcinoma (BCC) 07/11/1995   Top Right Upper Leg (curet and 5FU)   Superficial basal cell carcinoma (BCC) 01/28/1998   Right Back, Upper Thigh (curet and cautery)   Superficial basal cell carcinoma (BCC) 02/18/2002   Upper Inner Right Leg (curet and cautery)   Superficial basal cell carcinoma (BCC) 02/18/2002   Lower Abdomen (curet and 5FU)   Superficial basal cell carcinoma (BCC) 01/27/2013   Right Inner Thigh (tx p bx)   Syncope    History of     Past Surgical History:  Procedure Laterality Date   APPENDECTOMY     CESAREAN SECTION  [phone]    Social  History:   reports that she has never smoked. She has never used smokeless tobacco. She reports current alcohol use. She reports that she does not use drugs.  Allergies  Allergen Reactions   Hydralazine Hcl Shortness Of Breath    With throat numbness/tingling and swelling   Ciprofloxacin Other (See Comments)    triggered a migraine that was thought to be a TIA   Penicillins Rash    Has patient had a PCN reaction causing immediate rash, facial/tongue/throat swelling, SOB or lightheadedness with hypotension: Yes Has patient had a PCN reaction causing severe rash involving mucus membranes or skin necrosis: No Has patient had a PCN reaction that required hospitalization: No Has patient had a  PCN reaction occurring within the last 10 years: No If all of the above answers are "NO", then may proceed with Cephalosporin use.   Sulfonamide Derivatives Swelling   Apixaban Other (See Comments)    Other reaction(s): weakness   Avelox [Moxifloxacin] Other (See Comments)    Rash and severe tingling/numbness    Band-Aid Infection Defense [Bacitracin-Polymyxin B] Other (See Comments)    unknown   Dome-Paste Bandage [Wound Dressings] Hives   Mannitol    Multaq [Dronedarone]     Muscle aches   Nitrofurantoin Monohyd Macro     Other reaction(s): due to lung disease   Other Other (See Comments)    Water for injection, sterile - unknown   Prednisone     Other reaction(s): rash   Reclast [Zoledronic Acid] Other (See Comments)    hypertension   Sulfa Antibiotics Other (See Comments)    unknown   Chlorpheniramine Other (See Comments)    Headache, stomach ache, bladder "not working well"    Family History  Problem Relation Age of Onset   Atrial fibrillation Mother    Thyroid disease Mother    Alcohol abuse Father      Prior to Admission medications   Medication Sig Start Date End Date Taking? Authorizing Provider  cholecalciferol (VITAMIN D3) 25 MCG (1000 UNIT) tablet Take 1,000 Units by mouth daily.   Yes [provider]  ciclopirox (PENLAC) 8 % solution Apply 1 Application topically at bedtime. 06/08/22  Yes [provider]  diltiazem (CARDIZEM) 30 MG tablet Take 1 tablet (30 mg total) by mouth 4 (four) times daily as needed (for elevated heart rates). 08/22/21  Yes Camnitz, Ocie Doyne, MD  HYDROcodone-acetaminophen (NORCO/VICODIN) 5-325 MG tablet Take 1 tablet by mouth 2 (two) times daily as needed for severe pain. 04/07/22  Yes [provider]  Multiple Minerals-Vitamins (CITRACAL PLUS PO) Take 1 tablet by mouth daily.   Yes [provider]  Multiple Vitamin (MULTIVITAMIN WITH MINERALS) TABS tablet Take 1 tablet by mouth daily.   Yes [provider]  pantoprazole (PROTONIX) 40 MG tablet Take 1 tablet (40 mg total) by mouth daily. 02/28/22  Yes Tanda Rockers, MD  Polyethyl Glycol-Propyl Glycol (SYSTANE) 0.4-0.3 % GEL ophthalmic gel Place 1 Application into both eyes 2 (two) times daily.   Yes [provider]  famotidine (PEPCID) 20 MG tablet One after supper Patient not taking: Reported on 07/01/2022 01/10/22   Tanda Rockers, MD  Misc. Devices MISC by Does not apply route. Flutter valve when needed for cough    [provider]  omeprazole (PRILOSEC OTC) 20 MG tablet Take 1 tablet (20 mg total) by mouth daily. Take 30-60 min before first meal of the day Patient not taking: Reported on 04/17/2022 01/10/22   Melvyn Novas,  Christena Deem, MD  Rivaroxaban (XARELTO) 15 MG TABS tablet Xarelto 15 mg tablet   1 tablet every day by oral route. Patient not taking: Reported on 04/17/2022    [provider]    Physical Exam: Vitals:   07/01/22 1845 07/01/22 1900 07/01/22 2100 07/01/22 2102  BP: 127/63 125/61 (!) 126/59   Pulse: 75 77 80   Resp: '18 16 16   '$ Temp:    (!) 97.5 F (36.4 C)  TempSrc:    Oral  SpO2: 96% 95% 95%      Constitutional: NAD, no pallor or diaphoresis  Eyes: PERTLA, lids and conjunctivae normal ENMT: Mucous membranes are moist. Posterior pharynx clear of any exudate or lesions.   Neck: supple, no masses  Respiratory: no wheezing, no crackles. No accessory muscle use.  Cardiovascular: S1 & S2 heard, regular rate and rhythm. No extremity edema.   Abdomen: Soft, no distension, tender in LLQ without rebound pain or guarding. Bowel sounds active.  Musculoskeletal: no clubbing / cyanosis. No joint deformity upper and lower extremities.   Skin: no significant rashes, lesions, ulcers. Warm, dry, well-perfused. Neurologic: Gross hearing deficit, CN 2-12 grossly intact otherwise. Moving all extremities. Alert and oriented.  Psychiatric: Calm. Cooperative.    Labs and Imaging on Admission: I have  personally reviewed following labs and imaging studies  CBC: Recent Labs  Lab 07/01/22 1439  WBC 20.5*  NEUTROABS 18.5*  HGB 14.7  HCT 45.0  MCV 93.6  PLT 347   Basic Metabolic Panel: Recent Labs  Lab 07/01/22 1439  NA 140  K 4.7  CL 106  CO2 21*  GLUCOSE 104*  BUN 23  CREATININE 1.56*  CALCIUM 9.0  MG 2.1   GFR: CrCl cannot be calculated (Unknown ideal weight.). Liver Function Tests: Recent Labs  Lab 07/01/22 1439  AST 37  ALT 14  ALKPHOS 53  BILITOT 0.8  PROT 6.2*  ALBUMIN 3.3*   No results for input(s): "LIPASE", "AMYLASE" in the last 168 hours. No results for input(s): "AMMONIA" in the last 168 hours. Coagulation Profile: No results for input(s): "INR", "PROTIME" in the last 168 hours. Cardiac Enzymes: Recent Labs  Lab 07/01/22 1756  CKTOTAL 231  CKMB 5.0   BNP (last 3 results) No results for input(s): "PROBNP" in the last 8760 hours. HbA1C: No results for input(s): "HGBA1C" in the last 72 hours. CBG: No results for input(s): "GLUCAP" in the last 168 hours. Lipid Profile: No results for input(s): "CHOL", "HDL", "LDLCALC", "TRIG", "CHOLHDL", "LDLDIRECT" in the last 72 hours. Thyroid Function Tests: No results for input(s): "TSH", "T4TOTAL", "FREET4", "T3FREE", "THYROIDAB" in the last 72 hours. Anemia Panel: No results for input(s): "VITAMINB12", "FOLATE", "FERRITIN", "TIBC", "IRON", "RETICCTPCT" in the last 72 hours. Urine analysis:    Component Value Date/Time   COLORURINE COLORLESS (A) 10/29/2021 1911   APPEARANCEUR CLEAR 10/29/2021 1911   LABSPEC 1.009 10/29/2021 1911   PHURINE 7.0 10/29/2021 1911   GLUCOSEU NEGATIVE 10/29/2021 1911   HGBUR NEGATIVE 10/29/2021 1911   BILIRUBINUR NEGATIVE 10/29/2021 1911   KETONESUR NEGATIVE 10/29/2021 1911   PROTEINUR NEGATIVE 10/29/2021 1911   UROBILINOGEN 0.2 01/09/2011 0159   NITRITE NEGATIVE 10/29/2021 1911   LEUKOCYTESUR NEGATIVE 10/29/2021 1911   Sepsis  Labs: '@LABRCNTIP'$ (procalcitonin:4,lacticidven:4) )No results found for this or any previous visit (from the past 240 hour(s)).   Radiological Exams on Admission: CT Head Wo Contrast  Result Date: 07/01/2022 CLINICAL DATA:  Blunt trauma to head and neck. EXAM: CT HEAD WITHOUT CONTRAST CT CERVICAL  SPINE WITHOUT CONTRAST TECHNIQUE: Multidetector CT imaging of the head and cervical spine was performed following the standard protocol without intravenous contrast. Multiplanar CT image reconstructions of the cervical spine were also generated. RADIATION DOSE REDUCTION: This exam was performed according to the departmental dose-optimization program which includes automated exposure control, adjustment of the mA and/or kV according to patient size and/or use of iterative reconstruction technique. COMPARISON:  12/12/2019 FINDINGS: CT HEAD FINDINGS Brain: No evidence of intracranial hemorrhage, acute infarction, hydrocephalus, extra-axial collection, or mass lesion/mass effect. Stable mild diffuse cerebral atrophy. Stable severe chronic small vessel disease and old bilateral basal ganglia lacunar infarcts. Vascular:  No hyperdense vessel or other acute findings. Skull: No evidence of fracture or other significant bone abnormality. Sinuses/Orbits:  No acute findings. Other: None. CT CERVICAL SPINE FINDINGS Alignment: Normal. Skull base and vertebrae: No acute fracture. No primary bone lesion or focal pathologic process. Soft tissues and spinal canal: No prevertebral fluid or swelling. No visible canal hematoma. Disc levels: Moderate degenerative disc disease again seen C4-5 and C6-7. Mild right facet DJD is also seen levels of C4 C6. atlantoaxial degenerative changes also seen. Upper chest: No acute findings. Other: None. IMPRESSION: No acute intracranial abnormality. Stable cerebral atrophy, chronic small vessel disease, and old bilateral basal ganglia lacunar infarcts. No evidence of acute cervical spine fracture or  subluxation. Degenerative spondylosis, as described above. Electronically Signed   By: Marlaine Hind M.D.   On: 07/01/2022 16:53   CT Cervical Spine Wo Contrast  Result Date: 07/01/2022 CLINICAL DATA:  Blunt trauma to head and neck. EXAM: CT HEAD WITHOUT CONTRAST CT CERVICAL SPINE WITHOUT CONTRAST TECHNIQUE: Multidetector CT imaging of the head and cervical spine was performed following the standard protocol without intravenous contrast. Multiplanar CT image reconstructions of the cervical spine were also generated. RADIATION DOSE REDUCTION: This exam was performed according to the departmental dose-optimization program which includes automated exposure control, adjustment of the mA and/or kV according to patient size and/or use of iterative reconstruction technique. COMPARISON:  12/12/2019 FINDINGS: CT HEAD FINDINGS Brain: No evidence of intracranial hemorrhage, acute infarction, hydrocephalus, extra-axial collection, or mass lesion/mass effect. Stable mild diffuse cerebral atrophy. Stable severe chronic small vessel disease and old bilateral basal ganglia lacunar infarcts. Vascular:  No hyperdense vessel or other acute findings. Skull: No evidence of fracture or other significant bone abnormality. Sinuses/Orbits:  No acute findings. Other: None. CT CERVICAL SPINE FINDINGS Alignment: Normal. Skull base and vertebrae: No acute fracture. No primary bone lesion or focal pathologic process. Soft tissues and spinal canal: No prevertebral fluid or swelling. No visible canal hematoma. Disc levels: Moderate degenerative disc disease again seen C4-5 and C6-7. Mild right facet DJD is also seen levels of C4 C6. atlantoaxial degenerative changes also seen. Upper chest: No acute findings. Other: None. IMPRESSION: No acute intracranial abnormality. Stable cerebral atrophy, chronic small vessel disease, and old bilateral basal ganglia lacunar infarcts. No evidence of acute cervical spine fracture or subluxation. Degenerative  spondylosis, as described above. Electronically Signed   By: Marlaine Hind M.D.   On: 07/01/2022 16:53   DG Pelvis Portable  Result Date: 07/01/2022 CLINICAL DATA:  syncope? EXAM: PORTABLE PELVIS 1-2 VIEWS COMPARISON:  None Available. FINDINGS: The cortical margins of the bony pelvis are intact. No fracture. Pubic symphysis and sacroiliac joints are congruent. Faint pubic symphyseal chondrocalcinosis. Both femoral heads are well-seated in the respective acetabula. Mild to moderate bilateral hip osteoarthritis with joint space narrowing and spurring. Soft tissue calcification is seen superior to  the right greater trochanter. IMPRESSION: 1. No pelvic fracture. 2. Mild to moderate bilateral hip osteoarthritis. 3. Soft tissue calcification superior to the right greater trochanter may represent calcific tendinopathy. Electronically Signed   By: Keith Rake M.D.   On: 07/01/2022 15:15   DG Chest Port 1 View  Result Date: 07/01/2022 CLINICAL DATA:  syncope? EXAM: PORTABLE CHEST 1 VIEW COMPARISON:  Chest radiograph 10/29/2021, CT 08/09/2021 FINDINGS: The cardiomediastinal contours are normal. Left chest wall loop recorder. Pulmonary vasculature is normal. Chronic areas of nodularity and lung scarring, bronchiectasis on prior CT. No acute consolidation, pleural effusion, or pneumothorax. No acute osseous abnormalities are seen. IMPRESSION: 1. No acute abnormality. 2. Chronic areas of nodularity and lung scarring corresponding to bronchiectasis on prior CT. Electronically Signed   By: Keith Rake M.D.   On: 07/01/2022 15:14    EKG: Independently reviewed. Atrial fibrillation, rate 137, RAD.   Assessment/Plan   1. Syncope  - Appreciate cardiology consultation  - Loop recorder to be interrogated in am, continue cardiac monitoring and check orthostatic vitals    2. 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 Leukocytosis  - WBC is 20,500  - No change in chronic respiratory sxs, no urinary sxs, no meningismus, and no cellulitis noted  - She has LLQ pain, will check CT for ?diverticulitis, culture if she becomes febrile, repeat CBC in am   4. PAF  - Initially in rapid atrial flutter, converted to SR in ED  - Continue Xarelto     DVT prophylaxis: Xarelto  Code Status: Full  Level of Care: Level of care: Telemetry Cardiac Family Communication: None present   Disposition Plan:  Patient is from: Home  Anticipated d/c is to: TBD Anticipated d/c date is: 1/28 or 07/03/22  Patient currently: Pending syncope workup, improved/stable renal function  Consults called: Cardiology  Admission status: Observation     Vianne Bulls, MD [facility]  07/01/2022, 9:56 PM

## 2022-07-01 NOTE — ED Notes (Signed)
Patient given warm blanket per request. 

## 2022-07-02 DIAGNOSIS — R55 Syncope and collapse: Secondary | ICD-10-CM | POA: Diagnosis not present

## 2022-07-02 DIAGNOSIS — I471 Supraventricular tachycardia, unspecified: Secondary | ICD-10-CM

## 2022-07-02 DIAGNOSIS — I4891 Unspecified atrial fibrillation: Secondary | ICD-10-CM | POA: Diagnosis not present

## 2022-07-02 LAB — URINALYSIS, COMPLETE (UACMP) WITH MICROSCOPIC
Bilirubin Urine: NEGATIVE
Glucose, UA: NEGATIVE mg/dL
Hgb urine dipstick: NEGATIVE
Ketones, ur: NEGATIVE mg/dL
Nitrite: NEGATIVE
Protein, ur: NEGATIVE mg/dL
Specific Gravity, Urine: 1.014 (ref 1.005–1.030)
pH: 5 (ref 5.0–8.0)

## 2022-07-02 LAB — BASIC METABOLIC PANEL
Anion gap: 9 (ref 5–15)
BUN: 24 mg/dL — ABNORMAL HIGH (ref 8–23)
CO2: 20 mmol/L — ABNORMAL LOW (ref 22–32)
Calcium: 8.1 mg/dL — ABNORMAL LOW (ref 8.9–10.3)
Chloride: 110 mmol/L (ref 98–111)
Creatinine, Ser: 1.33 mg/dL — ABNORMAL HIGH (ref 0.44–1.00)
GFR, Estimated: 38 mL/min — ABNORMAL LOW (ref >60)
Glucose, Bld: 99 mg/dL (ref 70–99)
Potassium: 3.9 mmol/L (ref 3.5–5.1)
Sodium: 139 mmol/L (ref 135–145)

## 2022-07-02 LAB — CBC
HCT: 36.4 % (ref 36.0–46.0)
Hemoglobin: 12.1 g/dL (ref 12.0–15.0)
MCH: 31.1 pg (ref 26.0–34.0)
MCHC: 33.2 g/dL (ref 30.0–36.0)
MCV: 93.6 fL (ref 80.0–100.0)
Platelets: 236 10*3/uL (ref 150–400)
RBC: 3.89 MIL/uL (ref 3.87–5.11)
RDW: 13.5 % (ref 11.5–15.5)
WBC: 9.8 10*3/uL (ref 4.0–10.5)
nRBC: 0 % (ref 0.0–0.2)

## 2022-07-02 LAB — CREATININE, URINE, RANDOM: Creatinine, Urine: 129 mg/dL

## 2022-07-02 LAB — CBG MONITORING, ED: Glucose-Capillary: 104 mg/dL — ABNORMAL HIGH (ref 70–99)

## 2022-07-02 LAB — MAGNESIUM: Magnesium: 2 mg/dL (ref 1.7–2.4)

## 2022-07-02 LAB — SODIUM, URINE, RANDOM: Sodium, Ur: 40 mmol/L

## 2022-07-02 MED ORDER — FLECAINIDE ACETATE 50 MG PO TABS: 50.0000 mg | ORAL_TABLET | Freq: Two times a day (BID) | ORAL | 0 refills | Status: DC

## 2022-07-02 MED ORDER — FLECAINIDE ACETATE 50 MG PO TABS
50.0000 mg | ORAL_TABLET | Freq: Two times a day (BID) | ORAL | Status: DC
  Filled 2022-07-02: qty 1

## 2022-07-02 MED ORDER — METRONIDAZOLE 500 MG PO TABS: 500.0000 mg | ORAL_TABLET | Freq: Three times a day (TID) | ORAL | 0 refills | Status: AC

## 2022-07-02 MED ORDER — CEFUROXIME AXETIL 500 MG PO TABS: 500.0000 mg | ORAL_TABLET | Freq: Two times a day (BID) | ORAL | 0 refills | Status: AC

## 2022-07-02 NOTE — ED Notes (Signed)
MD ordered stat PT eval for imminent discharge of this patient. Said PT can obtain orthostatic vital signs

## 2022-07-02 NOTE — Evaluation (Signed)
Physical Therapy Evaluation and Discharge Patient Details Name: Shyenne Maggard MRN: [id number] DOB: 07-May-1933 Today's Date: 07/02/2022  History of Present Illness  Pt is a 89 y.o. F who presents 07/01/2022 for evaluation of syncope. Significant PMH:  paroxysmal atrial fibrillation on Xarelto and bronchiectasis.  Clinical Impression  Patient evaluated by Physical Therapy with no further acute PT needs identified. PTA, pt lives alone and works from home. She wears a Pharmacist, hospital. Pt seems to be fairly close to her baseline. Reports mild weakness in BLE's and instability. Pt ambulating 200 ft without physical assist. Denies dizziness/lightheadedness and orthostatics negative upon assessment. All education has been completed and the patient has no further questions. No follow-up Physical Therapy or equipment needs. PT is signing off. Thank you for this referral.  Orthostatic Vital Signs: Supine: 168/77 (103) Sitting: 168/80 (104) Standing: 166/74 (99)      Recommendations for follow up therapy are one component of a multi-disciplinary discharge planning process, led by the attending physician.  Recommendations may be updated based on patient status, additional functional criteria and insurance authorization.  Follow Up Recommendations No PT follow up      Assistance Recommended at Discharge Intermittent Supervision/Assistance  Patient can return home with the following  Assistance with cooking/housework;Assist for transportation;Help with stairs or ramp for entrance    Equipment Recommendations None recommended by PT  Recommendations for Other Services       Functional Status Assessment Patient has had a recent decline in their functional status and demonstrates the ability to make significant improvements in function in a reasonable and predictable amount of time.     Precautions / Restrictions Precautions Precautions: Fall Restrictions Weight Bearing Restrictions: No       Mobility  Bed Mobility Overal bed mobility: Needs Assistance Bed Mobility: Supine to Sit, Sit to Supine     Supine to sit: Min assist Sit to supine: Min assist   General bed mobility comments: Assist to and from stretcher    Transfers Overall transfer level: Needs assistance Equipment used: None Transfers: Sit to/from Stand Sit to Stand: Supervision                Ambulation/Gait Ambulation/Gait assistance: Supervision Gait Distance (Feet): 200 Feet Assistive device: None Gait Pattern/deviations: Step-through pattern, Decreased stride length Gait velocity: decreased Gait velocity interpretation: <1.8 ft/sec, indicate of risk for recurrent falls   General Gait Details: Mild instability, supervision for safety. Able to complete head turns and stepping over obstacles without gross loss of balance  Stairs            Wheelchair Mobility    Modified Rankin (Stroke Patients Only)       Balance Overall balance assessment: Mild deficits observed, not formally tested                                           Pertinent Vitals/Pain Pain Assessment Pain Assessment: No/denies pain    Home Living Family/patient expects to be discharged to:: Private residence Living Arrangements: Alone Available Help at Discharge: Family;Available PRN/intermittently Type of Home: House Home Access: Stairs to enter   Entrance Stairs-Number of Steps: 2 Alternate Level Stairs-Number of Steps: 12 (6, then landing, then another 6) Home Layout: Two level        Prior Function Prior Level of Function : Independent/Modified Independent  Mobility Comments: works from home for [employer] fund[employer], wears life alert       Journalist, newspaper        Extremity/Trunk Assessment   Upper Extremity Assessment Upper Extremity Assessment: Overall WFL for tasks assessed    Lower Extremity Assessment Lower Extremity Assessment: Generalized  weakness    Cervical / Trunk Assessment Cervical / Trunk Assessment: Kyphotic  Communication   Communication: HOH  Cognition Arousal/Alertness: Awake/alert Behavior During Therapy: WFL for tasks assessed/performed Overall Cognitive Status: Within Functional Limits for tasks assessed                                          General Comments      Exercises     Assessment/Plan    PT Assessment Patient does not need any further PT services  PT Problem List         PT Treatment Interventions      PT Goals (Current goals can be found in the Care Plan section)  Acute Rehab PT Goals Patient Stated Goal: return home PT Goal Formulation: All assessment and education complete, DC therapy    Frequency       Co-evaluation               AM-PAC PT "6 Clicks" Mobility  Outcome Measure Help needed turning from your back to your side while in a flat bed without using bedrails?: None Help needed moving from lying on your back to sitting on the side of a flat bed without using bedrails?: A Little Help needed moving to and from a bed to a chair (including a wheelchair)?: A Little Help needed standing up from a chair using your arms (e.g., wheelchair or bedside chair)?: A Little Help needed to walk in hospital room?: A Little Help needed climbing 3-5 steps with a railing? : A Little 6 Click Score: 19    End of Session   Activity Tolerance: Patient tolerated treatment well Patient left: in bed;with call bell/phone within reach Nurse Communication: Mobility status PT Visit Diagnosis: Unsteadiness on feet (R26.81)    Time: 7846-9629 PT Time Calculation (min) (ACUTE ONLY): 17 min   Charges:   PT Evaluation $PT Eval Low Complexity: 1 Low          Wyona Almas, PT, DPT Acute Rehabilitation Services Office [phone]   Deno Etienne 07/02/2022, 12:17 PM

## 2022-07-02 NOTE — Discharge Summary (Signed)
Physician Discharge Summary  [name] ZHY:[id number] DOB: Jul 23, 1932 DOA: 07/01/2022  PCP: Ramiro Harvest, PA-C  Admit date: 07/01/2022 Discharge date: 07/02/2022  Time spent: 40 minutes  Recommendations for Outpatient Follow-up:   Follow outpatient CBC/CMP Follow with cardiology outpatient regarding flecainide CT scan concerning for diverticulitis - follow with GI outpatient 7 mm pulm nodule, follow outpatient    Discharge Diagnoses:  Principal Problem:   Syncope and collapse Active Problems:   AF (paroxysmal atrial fibrillation) (HCC)   Leukocytosis   AKI (acute kidney injury) ([place])   Atrial fibrillation with RVR ([place])   Discharge Condition: stable  Diet recommendation: heart healthy   There were no vitals filed for this visit.  History of present illness:  [name] is [name] 89 y.o. female with medical history significant for paroxysmal atrial fibrillation on Xarelto and bronchiectasis who presents to the emergency department for evaluation of syncope.    Patient reports that she did not eat anything after lunch yesterday but felt well when she went to bed last night.  She then woke this morning with palpitations, took Jorgia Manthei dose of diltiazem and went back to bed, but began to feel acutely lightheaded and generally unwell.  She went to the bathroom to move her bowels, had worsening in her lightheadedness and general malaise, activated her life alert button, and then woke on the floor.   ED Course: Upon arrival to the ED, patient is found to be afebrile and saturating mid 90s on room air with systolic blood pressure in the 100s.  Initial EKG demonstrates atrial fibrillation with RVR.  Plain radiographs of the chest and pelvis are negative for acute findings.  No acute abnormality noted on CT of the head and cervical spine.  Labs are most notable for creatinine 1.56 and WBC 20,500.   Patient was given 1 L normal saline, acetaminophen, IV diltiazem, and oral diltiazem in  the ED.  She was evaluated by cardiology in the ED.  She converted to sinus rhythm.  SVT noted.  Cardiology started on flecainide.  CT abd/pelvis showed diverticulitis.  Stable at time of discharge.  See below for additional details.  Hospital Course:  Assessment and Plan: 1. Syncope  - episodes of SVT noted - possibly related to volume depletion with AKI? Also, she has abdominal imaging concerning for diverticulitis which could have contributed - negative orthostatics - seems back to baseline at this time.  Being started on flecainide.  Follow outpatient with cardiology.     3. Diverticulitis  Leukocytosis  - WBC is 20,500 -> resolved on day of discharge - No change in chronic respiratory sxs, no urinary sxs, no meningismus, and no cellulitis noted  - CT with mild sigmoid diverticulitis - will discharge on ceftin/flagyl (allergies noted, she was discharged on this regimen 10/2021 and seems to have tolerated it well)  2. AKI superimposed on CKD II  - SCr is 1.56 on admission, up from baseline <1   - improving on day of discharge, follow outpatient    4. PAF  - Initially in rapid atrial flutter, converted to SR in ED  - Continue Xarelto at discharge, started on flecainide     Procedures: none   Consultations: cardiology  Discharge Exam: Vitals:   07/02/22 0845 07/02/22 1423  BP: (!) 154/86 (!) 175/82  Pulse: 79 75  Resp: 17 16  Temp:  97.9 F (36.6 C)  SpO2: 95% 96%   Feels back to normal  General: No acute distress. Cardiovascular: Heart sounds  show [name] regular rate, and rhythm.   Lungs: Clear to auscultation bilaterally  Abdomen: Soft, nontender, nondistended  Neurological: Alert and oriented 3. Moves all extremities 4 with equal strength. Cranial nerves II through XII grossly intact. Extremities: No clubbing or cyanosis. No edema.  Discharge Instructions   Discharge Instructions     Call MD for:  difficulty breathing, headache or visual disturbances    Complete by: As directed    Call MD for:  extreme fatigue   Complete by: As directed    Call MD for:  hives   Complete by: As directed    Call MD for:  persistant dizziness or light-headedness   Complete by: As directed    Call MD for:  persistant nausea and vomiting   Complete by: As directed    Call MD for:  redness, tenderness, or signs of infection (pain, swelling, redness, odor or green/yellow discharge around incision site)   Complete by: As directed    Call MD for:  severe uncontrolled pain   Complete by: As directed    Call MD for:  temperature >100.4   Complete by: As directed    Diet - low sodium heart healthy   Complete by: As directed    Discharge instructions   Complete by: As directed    You were seen for an episode of loss of consciousness.  It's not immediately clear what led to this, but it may have been Nadezhda Pollitt combination of your heart rate and suspected dehydration.  You had evidence of diverticulitis on your CT scan.  I'll send you home with antibiotics.  Please follow up with your PCP as an outpatient.  Please follow up with Dr. Michail Sermon again as an outpatient.  We had cardiology come and see you.  Because of your heart rate, they started you on flecainide.  Please follow up with cardiology as an outpatient.  Your labs suggested that you were probably dehydrated to some extent.  This has improved today.  Follow with your PCP in [place] few days to repeat your labs and check your kidney function again.  You have Adalida Garver 7 mm pulmonary nodule that appears stable.  Follow this with your PCP outpatient.   Return for new, recurrent, or worsening symptoms.  Please ask your PCP to request records from this hospitalization so they know what was done and what the next steps will be.   Increase activity slowly   Complete by: As directed       Allergies as of 07/02/2022       Reactions   Hydralazine Hcl Shortness Of Breath   With throat numbness/tingling and swelling    Ciprofloxacin Other (See Comments)   triggered Ellon Marasco migraine that was thought to be Zamia Tyminski TIA   Penicillins Rash   Has patient had Zackry Deines PCN reaction causing immediate rash, facial/tongue/throat swelling, SOB or lightheadedness with hypotension: Yes Has patient had Destony Prevost PCN reaction causing severe rash involving mucus membranes or skin necrosis: No Has patient had Janann Boeve PCN reaction that required hospitalization: No Has patient had Jaelee Laughter PCN reaction occurring within the last 10 years: No If all of the above answers are "NO", then may proceed with Cephalosporin use.   Sulfonamide Derivatives Swelling   Apixaban Other (See Comments)   Other reaction(s): weakness   Avelox [moxifloxacin] Other (See Comments)   Rash and severe tingling/numbness    Band-aid Infection Defense [bacitracin-polymyxin B] Other (See Comments)   unknown   Dome-paste Bandage [wound Dressings] Hives  Mannitol    Multaq [dronedarone]    Muscle aches   Nitrofurantoin Monohyd Macro    Other reaction(s): due to lung disease   Other Other (See Comments)   Water for injection, sterile - unknown   Prednisone    Other reaction(s): rash   Reclast [zoledronic Acid] Other (See Comments)   hypertension   Sulfa Antibiotics Other (See Comments)   unknown   Chlorpheniramine Other (See Comments)   Headache, stomach ache, bladder "not working well"        Medication List     STOP taking these medications    famotidine 20 MG tablet Commonly known as: Pepcid   omeprazole 20 MG tablet Commonly known as: PriLOSEC OTC       TAKE these medications    cefUROXime 500 MG tablet Commonly known as: CEFTIN Take 1 tablet (500 mg total) by mouth 2 (two) times daily with Asier Desroches meal for 10 days.   cholecalciferol 25 MCG (1000 UNIT) tablet Commonly known as: VITAMIN D3 Take 1,000 Units by mouth daily.   ciclopirox 8 % solution Commonly known as: PENLAC Apply 1 Application topically at bedtime.   CITRACAL PLUS PO Take 1 tablet by mouth  daily.   diltiazem 30 MG tablet Commonly known as: Cardizem Take 1 tablet (30 mg total) by mouth 4 (four) times daily as needed (for elevated heart rates).   flecainide 50 MG tablet Commonly known as: TAMBOCOR Take 1 tablet (50 mg total) by mouth every 12 (twelve) hours. Follow up with cardiology for additional refills and instructions   HYDROcodone-acetaminophen 5-325 MG tablet Commonly known as: NORCO/VICODIN Take 1 tablet by mouth 2 (two) times daily as needed for severe pain.   metroNIDAZOLE 500 MG tablet Commonly known as: Flagyl Take 1 tablet (500 mg total) by mouth 3 (three) times daily for 14 days.   Misc. Devices Misc by Does not apply route. Flutter valve when needed for cough   multivitamin with minerals Tabs tablet Take 1 tablet by mouth daily.   pantoprazole 40 MG tablet Commonly known as: Protonix Take 1 tablet (40 mg total) by mouth daily.   Systane 0.4-0.3 % Gel ophthalmic gel Generic drug: Polyethyl Glycol-Propyl Glycol Place 1 Application into both eyes 2 (two) times daily.   Xarelto 15 MG Tabs tablet Generic drug: Rivaroxaban Xarelto 15 mg tablet   1 tablet every day by oral route.       Allergies  Allergen Reactions   Hydralazine Hcl Shortness Of Breath    With throat numbness/tingling and swelling   Ciprofloxacin Other (See Comments)    triggered Solara Goodchild migraine that was thought to be Keven Soucy TIA   Penicillins Rash    Has patient had Alcee Sipos PCN reaction causing immediate rash, facial/tongue/throat swelling, SOB or lightheadedness with hypotension: Yes Has patient had Ayshia Gramlich PCN reaction causing severe rash involving mucus membranes or skin necrosis: No Has patient had Deven Audi PCN reaction that required hospitalization: No Has patient had Caelin Rosen PCN reaction occurring within the last 10 years: No If all of the above answers are "NO", then may proceed with Cephalosporin use.   Sulfonamide Derivatives Swelling   Apixaban Other (See Comments)    Other reaction(s): weakness    Avelox [Moxifloxacin] Other (See Comments)    Rash and severe tingling/numbness    Band-Aid Infection Defense [Bacitracin-Polymyxin B] Other (See Comments)    unknown   Dome-Paste Bandage [Wound Dressings] Hives   Mannitol    Multaq [Dronedarone]     Muscle aches  Nitrofurantoin Monohyd Macro     Other reaction(s): due to lung disease   Other Other (See Comments)    Water for injection, sterile - unknown   Prednisone     Other reaction(s): rash   Reclast [Zoledronic Acid] Other (See Comments)    hypertension   Sulfa Antibiotics Other (See Comments)    unknown   Chlorpheniramine Other (See Comments)    Headache, stomach ache, bladder "not working well"      The results of significant diagnostics from this hospitalization (including imaging, microbiology, ancillary and laboratory) are listed below for reference.    Significant Diagnostic Studies: CT ABDOMEN PELVIS WO CONTRAST  Result Date: 07/01/2022 CLINICAL DATA:  Left lower quadrant pain EXAM: CT ABDOMEN AND PELVIS WITHOUT CONTRAST TECHNIQUE: Multidetector CT imaging of the abdomen and pelvis was performed following the standard protocol without IV contrast. RADIATION DOSE REDUCTION: This exam was performed according to the departmental dose-optimization program which includes automated exposure control, adjustment of the mA and/or kV according to patient size and/or use of iterative reconstruction technique. COMPARISON:  01/26/2022 FINDINGS: Lower chest: Stable changes of bronchiectasis and 7 mm right lower lobe pulmonary nodule similar to that seen on prior exams dating back to May of 2022 Hepatobiliary: Stable cyst is noted in the posterior right lobe of the liver. No new focal abnormality is seen. Gallbladder is within normal limits. Pancreas: Unremarkable. No pancreatic ductal dilatation or surrounding inflammatory changes. Spleen: Normal in size without focal abnormality. Adrenals/Urinary Tract: Adrenal glands are within normal  limits. Kidneys are well visualized bilaterally. No renal calculi or obstructive changes are seen. The bladder is well distended. Stomach/Bowel: Diverticular changes are seen with very minimal pericolonic inflammatory change consistent with acute diverticulitis. No perforation or abscess is seen. Appendix is not well visualized consistent with the given clinical history. Small bowel and stomach appear within normal limits. Vascular/Lymphatic: Aortic atherosclerosis. No enlarged abdominal or pelvic lymph nodes. Reproductive: Uterus and bilateral adnexa are unremarkable. Other: No abdominal wall hernia or abnormality. No abdominopelvic ascites. Musculoskeletal: No acute or significant osseous findings. IMPRESSION: Mild sigmoid diverticulitis without complicating factors. Stable 7 mm nodule in the right lower lobe is noted. This is been stable for 18 months. No further follow-up is recommended. Electronically Signed   By: Inez Catalina M.D.   On: 07/01/2022 23:49   CT Head Wo Contrast  Result Date: 07/01/2022 CLINICAL DATA:  Blunt trauma to head and neck. EXAM: CT HEAD WITHOUT CONTRAST CT CERVICAL SPINE WITHOUT CONTRAST TECHNIQUE: Multidetector CT imaging of the head and cervical spine was performed following the standard protocol without intravenous contrast. Multiplanar CT image reconstructions of the cervical spine were also generated. RADIATION DOSE REDUCTION: This exam was performed according to the departmental dose-optimization program which includes automated exposure control, adjustment of the mA and/or kV according to patient size and/or use of iterative reconstruction technique. COMPARISON:  12/12/2019 FINDINGS: CT HEAD FINDINGS Brain: No evidence of intracranial hemorrhage, acute infarction, hydrocephalus, extra-axial collection, or mass lesion/mass effect. Stable mild diffuse cerebral atrophy. Stable severe chronic small vessel disease and old bilateral basal ganglia lacunar infarcts. Vascular:  No  hyperdense vessel or other acute findings. Skull: No evidence of fracture or other significant bone abnormality. Sinuses/Orbits:  No acute findings. Other: None. CT CERVICAL SPINE FINDINGS Alignment: Normal. Skull base and vertebrae: No acute fracture. No primary bone lesion or focal pathologic process. Soft tissues and spinal canal: No prevertebral fluid or swelling. No visible canal hematoma. Disc levels: Moderate degenerative disc disease  again seen C4-5 and C6-7. Mild right facet DJD is also seen levels of C4 C6. atlantoaxial degenerative changes also seen. Upper chest: No acute findings. Other: None. IMPRESSION: No acute intracranial abnormality. Stable cerebral atrophy, chronic small vessel disease, and old bilateral basal ganglia lacunar infarcts. No evidence of acute cervical spine fracture or subluxation. Degenerative spondylosis, as described above. Electronically Signed   By: Marlaine Hind M.D.   On: 07/01/2022 16:53   CT Cervical Spine Wo Contrast  Result Date: 07/01/2022 CLINICAL DATA:  Blunt trauma to head and neck. EXAM: CT HEAD WITHOUT CONTRAST CT CERVICAL SPINE WITHOUT CONTRAST TECHNIQUE: Multidetector CT imaging of the head and cervical spine was performed following the standard protocol without intravenous contrast. Multiplanar CT image reconstructions of the cervical spine were also generated. RADIATION DOSE REDUCTION: This exam was performed according to the departmental dose-optimization program which includes automated exposure control, adjustment of the mA and/or kV according to patient size and/or use of iterative reconstruction technique. COMPARISON:  12/12/2019 FINDINGS: CT HEAD FINDINGS Brain: No evidence of intracranial hemorrhage, acute infarction, hydrocephalus, extra-axial collection, or mass lesion/mass effect. Stable mild diffuse cerebral atrophy. Stable severe chronic small vessel disease and old bilateral basal ganglia lacunar infarcts. Vascular:  No hyperdense vessel or other  acute findings. Skull: No evidence of fracture or other significant bone abnormality. Sinuses/Orbits:  No acute findings. Other: None. CT CERVICAL SPINE FINDINGS Alignment: Normal. Skull base and vertebrae: No acute fracture. No primary bone lesion or focal pathologic process. Soft tissues and spinal canal: No prevertebral fluid or swelling. No visible canal hematoma. Disc levels: Moderate degenerative disc disease again seen C4-5 and C6-7. Mild right facet DJD is also seen levels of C4 C6. atlantoaxial degenerative changes also seen. Upper chest: No acute findings. Other: None. IMPRESSION: No acute intracranial abnormality. Stable cerebral atrophy, chronic small vessel disease, and old bilateral basal ganglia lacunar infarcts. No evidence of acute cervical spine fracture or subluxation. Degenerative spondylosis, as described above. Electronically Signed   By: Marlaine Hind M.D.   On: 07/01/2022 16:53   DG Pelvis Portable  Result Date: 07/01/2022 CLINICAL DATA:  syncope? EXAM: PORTABLE PELVIS 1-2 VIEWS COMPARISON:  None Available. FINDINGS: The cortical margins of the bony pelvis are intact. No fracture. Pubic symphysis and sacroiliac joints are congruent. Faint pubic symphyseal chondrocalcinosis. Both femoral heads are well-seated in the respective acetabula. Mild to moderate bilateral hip osteoarthritis with joint space narrowing and spurring. Soft tissue calcification is seen superior to the right greater trochanter. IMPRESSION: 1. No pelvic fracture. 2. Mild to moderate bilateral hip osteoarthritis. 3. Soft tissue calcification superior to the right greater trochanter may represent calcific tendinopathy. Electronically Signed   By: Keith Rake M.D.   On: 07/01/2022 15:15   DG Chest Port 1 View  Result Date: 07/01/2022 CLINICAL DATA:  syncope? EXAM: PORTABLE CHEST 1 VIEW COMPARISON:  Chest radiograph 10/29/2021, CT 08/09/2021 FINDINGS: The cardiomediastinal contours are normal. Left chest wall loop  recorder. Pulmonary vasculature is normal. Chronic areas of nodularity and lung scarring, bronchiectasis on prior CT. No acute consolidation, pleural effusion, or pneumothorax. No acute osseous abnormalities are seen. IMPRESSION: 1. No acute abnormality. 2. Chronic areas of nodularity and lung scarring corresponding to bronchiectasis on prior CT. Electronically Signed   By: Keith Rake M.D.   On: 07/01/2022 15:14   CUP PACEART REMOTE DEVICE CHECK  Result Date: 06/06/2022 ILR summary report received. Battery status OK. Normal device function. No new symptom, tachy, brady, or pause episodes. No new  AF episodes. Monthly summary reports and ROV/PRN   Microbiology: No results found for this or any previous visit (from the past 240 hour(s)).   Labs: Basic Metabolic Panel: Recent Labs  Lab 07/01/22 1439 07/02/22 0600  NA 140 139  K 4.7 3.9  CL 106 110  CO2 21* 20*  GLUCOSE 104* 99  BUN 23 24*  CREATININE 1.56* 1.33*  CALCIUM 9.0 8.1*  MG 2.1 2.0   Liver Function Tests: Recent Labs  Lab 07/01/22 1439  AST 37  ALT 14  ALKPHOS 53  BILITOT 0.8  PROT 6.2*  ALBUMIN 3.3*   No results for input(s): "LIPASE", "AMYLASE" in the last 168 hours. No results for input(s): "AMMONIA" in the last 168 hours. CBC: Recent Labs  Lab 07/01/22 1439 07/02/22 0600  WBC 20.5* 9.8  NEUTROABS 18.5*  --   HGB 14.7 12.1  HCT 45.0 36.4  MCV 93.6 93.6  PLT 296 236   Cardiac Enzymes: Recent Labs  Lab 07/01/22 1756  CKTOTAL 231  CKMB 5.0   BNP: BNP (last 3 results) No results for input(s): "BNP" in the last 8760 hours.  ProBNP (last 3 results) No results for input(s): "PROBNP" in the last 8760 hours.  CBG: Recent Labs  Lab 07/02/22 0616  GLUCAP 104*       Signed:  Fayrene Helper MD.  [facility] 07/02/2022, 2:42 PM

## 2022-07-02 NOTE — Progress Notes (Signed)
Rounding Note    Patient Name: [name] Date of Encounter: 07/02/2022  [place] Cardiologist: Mertie Moores, MD   Subjective   Feeling well today.  No further episodes of syncope or near syncope.  Inpatient Medications    Scheduled Meds:  ciclopirox  1 Application Topical QHS   flecainide  50 mg Oral Q12H   pantoprazole  40 mg Oral Daily   Rivaroxaban  15 mg Oral Q supper   sodium chloride flush  3 mL Intravenous Q12H   Continuous Infusions:  sodium chloride 100 mL/hr at 07/02/22 0345   PRN Meds: acetaminophen **OR** acetaminophen, ondansetron **OR** ondansetron (ZOFRAN) IV, oxyCODONE, senna-docusate   Vital Signs    Vitals:   07/01/22 2300 07/02/22 0233 07/02/22 0620 07/02/22 0845  BP: (!) 126/50 131/67 (!) 141/65 (!) 154/86  Pulse: 88 77 87 79  Resp: (!) 22 16 (!) 22 17  Temp: (!) 97.5 F (36.4 C) 98 F (36.7 C) (!) 97.5 F (36.4 C)   TempSrc:  Oral Oral   SpO2: 95% 95% 95% 95%    Intake/Output Summary (Last 24 hours) at 07/02/2022 1018 Last data filed at 07/01/2022 2151 Gross per 24 hour  Intake 999 ml  Output --  Net 999 ml      04/17/2022    3:17 PM 01/10/2022    4:01 PM 01/10/2022    2:13 PM  Last 3 Weights  Weight (lbs) 124 lb 6.4 oz 122 lb 9.6 oz 122 lb 6.4 oz  Weight (kg) 56.427 kg 55.611 kg 55.52 kg      Telemetry    Sinus rhythm- Personally Reviewed  ECG    Sinus rhythm- Personally Reviewed  Physical Exam   GEN: No acute distress.   Neck: No JVD Cardiac: RRR, no murmurs, rubs, or gallops.  Respiratory: Clear to auscultation bilaterally. GI: Soft, nontender, non-distended  MS: No edema; No deformity. Neuro:  Nonfocal  Psych: Normal affect   Labs    High Sensitivity Troponin:  No results for input(s): "TROPONINIHS" in the last 720 hours.   Chemistry Recent Labs  Lab 07/01/22 1439 07/02/22 0600  NA 140 139  K 4.7 3.9  CL 106 110  CO2 21* 20*  GLUCOSE 104* 99  BUN 23 24*  CREATININE 1.56* 1.33*   CALCIUM 9.0 8.1*  MG 2.1 2.0  PROT 6.2*  --   ALBUMIN 3.3*  --   AST 37  --   ALT 14  --   ALKPHOS 53  --   BILITOT 0.8  --   GFRNONAA 32* 38*  ANIONGAP 13 9    Lipids No results for input(s): "CHOL", "TRIG", "HDL", "LABVLDL", "LDLCALC", "CHOLHDL" in the last 168 hours.  Hematology Recent Labs  Lab 07/01/22 1439 07/02/22 0600  WBC 20.5* 9.8  RBC 4.81 3.89  HGB 14.7 12.1  HCT 45.0 36.4  MCV 93.6 93.6  MCH 30.6 31.1  MCHC 32.7 33.2  RDW 13.3 13.5  PLT 296 236   Thyroid No results for input(s): "TSH", "FREET4" in the last 168 hours.  BNPNo results for input(s): "BNP", "PROBNP" in the last 168 hours.  DDimer No results for input(s): "DDIMER" in the last 168 hours.   Radiology    CT ABDOMEN PELVIS WO CONTRAST  Result Date: 07/01/2022 CLINICAL DATA:  Left lower quadrant pain EXAM: CT ABDOMEN AND PELVIS WITHOUT CONTRAST TECHNIQUE: Multidetector CT imaging of the abdomen and pelvis was performed following the standard protocol without IV contrast. RADIATION DOSE REDUCTION: This  exam was performed according to the departmental dose-optimization program which includes automated exposure control, adjustment of the mA and/or kV according to patient size and/or use of iterative reconstruction technique. COMPARISON:  01/26/2022 FINDINGS: Lower chest: Stable changes of bronchiectasis and 7 mm right lower lobe pulmonary nodule similar to that seen on prior exams dating back to May of 2022 Hepatobiliary: Stable cyst is noted in the posterior right lobe of the liver. No new focal abnormality is seen. Gallbladder is within normal limits. Pancreas: Unremarkable. No pancreatic ductal dilatation or surrounding inflammatory changes. Spleen: Normal in size without focal abnormality. Adrenals/Urinary Tract: Adrenal glands are within normal limits. Kidneys are well visualized bilaterally. No renal calculi or obstructive changes are seen. The bladder is well distended. Stomach/Bowel: Diverticular changes  are seen with very minimal pericolonic inflammatory change consistent with acute diverticulitis. No perforation or abscess is seen. Appendix is not well visualized consistent with the given clinical history. Small bowel and stomach appear within normal limits. Vascular/Lymphatic: Aortic atherosclerosis. No enlarged abdominal or pelvic lymph nodes. Reproductive: Uterus and bilateral adnexa are unremarkable. Other: No abdominal wall hernia or abnormality. No abdominopelvic ascites. Musculoskeletal: No acute or significant osseous findings. IMPRESSION: Mild sigmoid diverticulitis without complicating factors. Stable 7 mm nodule in the right lower lobe is noted. This is been stable for 18 months. No further follow-up is recommended. Electronically Signed   By: Inez Catalina M.D.   On: 07/01/2022 23:49   CT Head Wo Contrast  Result Date: 07/01/2022 CLINICAL DATA:  Blunt trauma to head and neck. EXAM: CT HEAD WITHOUT CONTRAST CT CERVICAL SPINE WITHOUT CONTRAST TECHNIQUE: Multidetector CT imaging of the head and cervical spine was performed following the standard protocol without intravenous contrast. Multiplanar CT image reconstructions of the cervical spine were also generated. RADIATION DOSE REDUCTION: This exam was performed according to the departmental dose-optimization program which includes automated exposure control, adjustment of the mA and/or kV according to patient size and/or use of iterative reconstruction technique. COMPARISON:  12/12/2019 FINDINGS: CT HEAD FINDINGS Brain: No evidence of intracranial hemorrhage, acute infarction, hydrocephalus, extra-axial collection, or mass lesion/mass effect. Stable mild diffuse cerebral atrophy. Stable severe chronic small vessel disease and old bilateral basal ganglia lacunar infarcts. Vascular:  No hyperdense vessel or other acute findings. Skull: No evidence of fracture or other significant bone abnormality. Sinuses/Orbits:  No acute findings. Other: None. CT  CERVICAL SPINE FINDINGS Alignment: Normal. Skull base and vertebrae: No acute fracture. No primary bone lesion or focal pathologic process. Soft tissues and spinal canal: No prevertebral fluid or swelling. No visible canal hematoma. Disc levels: Moderate degenerative disc disease again seen C4-5 and C6-7. Mild right facet DJD is also seen levels of C4 C6. atlantoaxial degenerative changes also seen. Upper chest: No acute findings. Other: None. IMPRESSION: No acute intracranial abnormality. Stable cerebral atrophy, chronic small vessel disease, and old bilateral basal ganglia lacunar infarcts. No evidence of acute cervical spine fracture or subluxation. Degenerative spondylosis, as described above. Electronically Signed   By: Marlaine Hind M.D.   On: 07/01/2022 16:53   CT Cervical Spine Wo Contrast  Result Date: 07/01/2022 CLINICAL DATA:  Blunt trauma to head and neck. EXAM: CT HEAD WITHOUT CONTRAST CT CERVICAL SPINE WITHOUT CONTRAST TECHNIQUE: Multidetector CT imaging of the head and cervical spine was performed following the standard protocol without intravenous contrast. Multiplanar CT image reconstructions of the cervical spine were also generated. RADIATION DOSE REDUCTION: This exam was performed according to the departmental dose-optimization program which includes automated exposure  control, adjustment of the mA and/or kV according to patient size and/or use of iterative reconstruction technique. COMPARISON:  12/12/2019 FINDINGS: CT HEAD FINDINGS Brain: No evidence of intracranial hemorrhage, acute infarction, hydrocephalus, extra-axial collection, or mass lesion/mass effect. Stable mild diffuse cerebral atrophy. Stable severe chronic small vessel disease and old bilateral basal ganglia lacunar infarcts. Vascular:  No hyperdense vessel or other acute findings. Skull: No evidence of fracture or other significant bone abnormality. Sinuses/Orbits:  No acute findings. Other: None. CT CERVICAL SPINE FINDINGS  Alignment: Normal. Skull base and vertebrae: No acute fracture. No primary bone lesion or focal pathologic process. Soft tissues and spinal canal: No prevertebral fluid or swelling. No visible canal hematoma. Disc levels: Moderate degenerative disc disease again seen C4-5 and C6-7. Mild right facet DJD is also seen levels of C4 C6. atlantoaxial degenerative changes also seen. Upper chest: No acute findings. Other: None. IMPRESSION: No acute intracranial abnormality. Stable cerebral atrophy, chronic small vessel disease, and old bilateral basal ganglia lacunar infarcts. No evidence of acute cervical spine fracture or subluxation. Degenerative spondylosis, as described above. Electronically Signed   By: Marlaine Hind M.D.   On: 07/01/2022 16:53   DG Pelvis Portable  Result Date: 07/01/2022 CLINICAL DATA:  syncope? EXAM: PORTABLE PELVIS 1-2 VIEWS COMPARISON:  None Available. FINDINGS: The cortical margins of the bony pelvis are intact. No fracture. Pubic symphysis and sacroiliac joints are congruent. Faint pubic symphyseal chondrocalcinosis. Both femoral heads are well-seated in the respective acetabula. Mild to moderate bilateral hip osteoarthritis with joint space narrowing and spurring. Soft tissue calcification is seen superior to the right greater trochanter. IMPRESSION: 1. No pelvic fracture. 2. Mild to moderate bilateral hip osteoarthritis. 3. Soft tissue calcification superior to the right greater trochanter may represent calcific tendinopathy. Electronically Signed   By: Keith Rake M.D.   On: 07/01/2022 15:15   DG Chest Port 1 View  Result Date: 07/01/2022 CLINICAL DATA:  syncope? EXAM: PORTABLE CHEST 1 VIEW COMPARISON:  Chest radiograph 10/29/2021, CT 08/09/2021 FINDINGS: The cardiomediastinal contours are normal. Left chest wall loop recorder. Pulmonary vasculature is normal. Chronic areas of nodularity and lung scarring, bronchiectasis on prior CT. No acute consolidation, pleural effusion, or  pneumothorax. No acute osseous abnormalities are seen. IMPRESSION: 1. No acute abnormality. 2. Chronic areas of nodularity and lung scarring corresponding to bronchiectasis on prior CT. Electronically Signed   By: Keith Rake M.D.   On: 07/01/2022 15:14    Cardiac Studies   TTE 10/2021  1. Left ventricular ejection fraction, by estimation, is 60 to 65%. The  left ventricle has normal function. The left ventricle has no regional  wall motion abnormalities. Left ventricular diastolic parameters were  normal.   2. Right ventricular systolic function is normal. The right ventricular  size is normal. There is normal pulmonary artery systolic pressure.   3. The pericardial effusion is anterior to the right ventricle.   4. The mitral valve is normal in structure. No evidence of mitral valve  regurgitation. No evidence of mitral stenosis.   5. The aortic valve is tricuspid. There is mild calcification of the  aortic valve. Aortic valve regurgitation is mild. Aortic valve sclerosis  is present, with no evidence of aortic valve stenosis.   6. The inferior vena cava is dilated in size with >50% respiratory  variability, suggesting right atrial pressure of 8 mmHg.   Patient Profile     89 y.o. female history of hypertension, atrial fibrillation, SVT, syncope, chronic bronchitis seen for the  of syncope.  Assessment & Plan    SVT: Patient has had multiple episodes of SVT, none in the last few months.  She did feel palpitations prior to this episode.  She has been intolerant of multiple medications.  Kirsti Mcalpine start flecainide 50 mg twice daily.  She has as needed diltiazem to take on an as-needed basis.  Unclear as to whether or not this is the cause of her episode of syncope.  Syncope: Unclear etiology.  Episode does appear to be somewhat prolonged.  No trauma on CT scan.  Continue with current management.  Atrial fibrillation/flutter: Patient is in sinus rhythm.  Continue Xarelto.  Flecainide as  above.  Hays Gianno Volner sign off.   Medication Recommendations: Flecainide 50 mg twice daily Other recommendations (labs, testing, etc): None Follow up as an outpatient: To be arranged in EP clinic  For questions or updates, please contact Caroline Please consult [email or web address] for contact info under        Signed, Lalana Wachter Meredith Leeds, MD  07/02/2022, 10:18 AM

## 2022-07-09 LAB — CUP PACEART REMOTE DEVICE CHECK
Date Time Interrogation Session: 20240203231317
Implantable Pulse Generator Implant Date: 20210824

## 2022-07-10 ENCOUNTER — Ambulatory Visit: Payer: Medicare Other

## 2022-07-10 DIAGNOSIS — R55 Syncope and collapse: Secondary | ICD-10-CM | POA: Diagnosis not present

## 2022-07-11 NOTE — Progress Notes (Signed)
Carelink Summary Report / Loop Recorder 

## 2022-07-13 ENCOUNTER — Encounter (HOSPITAL_COMMUNITY): Payer: Self-pay | Admitting: *Deleted

## 2022-07-18 DIAGNOSIS — K5792 Diverticulitis of intestine, part unspecified, without perforation or abscess without bleeding: Secondary | ICD-10-CM | POA: Diagnosis not present

## 2022-07-18 DIAGNOSIS — R309 Painful micturition, unspecified: Secondary | ICD-10-CM | POA: Diagnosis not present

## 2022-07-18 DIAGNOSIS — I480 Paroxysmal atrial fibrillation: Secondary | ICD-10-CM | POA: Diagnosis not present

## 2022-07-18 DIAGNOSIS — R531 Weakness: Secondary | ICD-10-CM | POA: Diagnosis not present

## 2022-07-30 NOTE — Progress Notes (Unsigned)
Cardiology Office Note Date:  07/30/2022  Patient ID:  [name], [name] Nov 20, 1932, MRN [id number] PCP:  Ramiro Harvest, PA-C  Electrophysiologist: Dr. Curt Bears  ***refresh   Chief Complaint: ***  History of Present Illness: Jaquasha Gotts is a 89 y.o. female with history of HTN, PAFib/SVT, chronic bronchitis  Admitted 07/01/22 after a syncopal event, had not eaten much that day, had soe elevated HRs and took her diltiazem, started to feel dizzy, ultimately had a syncopal event in the [place] Loop interrogated, noted that she had been having multiple episodes of SVTs though none of late, started on Flecainide. Unclear cause of her syncope Discharged 07/02/22  *** flecainide EKG, nodal blocker PRN only?? IL with AFib w/RVR episodes... 1/27, no brady, pause *** syncope? *** orthostatics? *** EST???   *** xarelto, dose, labs, bleeding  Device information MDT LINQ II, implanted 01/27/2020   Past Medical History:  Diagnosis Date   Arm numbness left    Back pain    BCC (basal cell carcinoma of skin) 03/16/1986   Left Shoulder (curet and excision)   BCC (basal cell carcinoma of skin) 03/10/1988   Mid Back (tx p bx)   BCC (basal cell carcinoma of skin) 01/28/1998   Upper Lip (curet and excision)   BCC (basal cell carcinoma of skin) 10/24/2005   Right Mid Back (curet and 5FU)   Bronchitis, chronic (HCC)    Depression    Dizziness    Fatigue    Hypertension    mild   Obstructive bronchiectasis ([facility]) 08/28/2007    Followed in Pulmonary clinic/ [facility]/ [facility]  Onset ? Age 89 wit bronchiectasis first confirmed 2007  - Alpha one Screen  August 18, 2010 = MM  - See CT Chest 09/29/05 Stable bronchiectasis in right middle lobe and lingula.  Scattered tree in bud opacities throughout the lungs, right greater than left,  nonspecific.   - PFT's 06/21/2012 1.67 (91%) ratio 64 and no change,  DLCO 77% - [place]   Palpitations    SCCA (squamous cell carcinoma) of skin 05/08/2011    Left Hand (in situ) (tx p bx)   Skin cancer    Stress    Superficial basal cell carcinoma (BCC) 07/11/1995   Top Right Upper Leg (curet and 5FU)   Superficial basal cell carcinoma (BCC) 01/28/1998   Right Back, Upper Thigh (curet and cautery)   Superficial basal cell carcinoma (BCC) 02/18/2002   Upper Inner Right Leg (curet and cautery)   Superficial basal cell carcinoma (BCC) 02/18/2002   Lower Abdomen (curet and 5FU)   Superficial basal cell carcinoma (BCC) 01/27/2013   Right Inner Thigh (tx p bx)   Syncope    History of     Past Surgical History:  Procedure Laterality Date   APPENDECTOMY     CESAREAN SECTION  [phone]    Current Outpatient Medications  Medication Sig Dispense Refill   cholecalciferol (VITAMIN D3) 25 MCG (1000 UNIT) tablet Take 1,000 Units by mouth daily.     ciclopirox (PENLAC) 8 % solution Apply 1 Application topically at bedtime.     diltiazem (CARDIZEM) 30 MG tablet Take 1 tablet (30 mg total) by mouth 4 (four) times daily as needed (for elevated heart rates). 30 tablet 1   flecainide (TAMBOCOR) 50 MG tablet Take 1 tablet (50 mg total) by mouth every 12 (twelve) hours. Follow up with cardiology for additional refills and instructions 60 tablet 0   HYDROcodone-acetaminophen (NORCO/VICODIN) 5-325 MG tablet Take 1  tablet by mouth 2 (two) times daily as needed for severe pain.     Misc. Devices MISC by Does not apply route. Flutter valve when needed for cough     Multiple Minerals-Vitamins (CITRACAL PLUS PO) Take 1 tablet by mouth daily.     Multiple Vitamin (MULTIVITAMIN WITH MINERALS) TABS tablet Take 1 tablet by mouth daily.     pantoprazole (PROTONIX) 40 MG tablet Take 1 tablet (40 mg total) by mouth daily. 30 tablet 4   Polyethyl Glycol-Propyl Glycol (SYSTANE) 0.4-0.3 % GEL ophthalmic gel Place 1 Application into both eyes 2 (two) times daily.     Rivaroxaban (XARELTO) 15 MG TABS tablet Xarelto 15 mg tablet   1 tablet every day by oral route.  (Patient not taking: Reported on 04/17/2022)     No current facility-administered medications for this visit.    Allergies:   Hydralazine hcl, Ciprofloxacin, Penicillins, Sulfonamide derivatives, Apixaban, Avelox [moxifloxacin], Band-aid infection defense [bacitracin-polymyxin b], Dome-paste bandage [wound dressings], Mannitol, Multaq [dronedarone], Nitrofurantoin monohyd macro, Other, Prednisone, Reclast [zoledronic acid], Sulfa antibiotics, and Chlorpheniramine   Social History:  The patient  reports that she has never smoked. She has never used smokeless tobacco. She reports current alcohol use. She reports that she does not use drugs.   Family History:  The patient's family history includes Alcohol abuse in her father; Atrial fibrillation in her mother; Thyroid disease in her mother.  ROS:  Please see the history of present illness.    All other systems are reviewed and otherwise negative.   PHYSICAL EXAM:  VS:  There were no vitals taken for this visit. BMI: There is no height or weight on file to calculate BMI. Well nourished, well developed, in no acute distress HEENT: normocephalic, atraumatic Neck: no JVD, carotid bruits or masses Cardiac:  *** RRR; no significant murmurs, no rubs, or gallops Lungs:  *** CTA b/l, no wheezing, rhonchi or rales Abd: soft, nontender MS: no deformity or *** atrophy Ext: *** no edema Skin: warm and dry, no rash Neuro:  No gross deficits appreciated Psych: euthymic mood, full affect   EKG:  Done today and reviewed by myself shows  ***  ECHO: 10/30/2021  1. Left ventricular ejection fraction, by estimation, is 60 to 65%. The  left ventricle has normal function. The left ventricle has no regional  wall motion abnormalities. Left ventricular diastolic parameters were  normal.   2. Right ventricular systolic function is normal. The right ventricular  size is normal. There is normal pulmonary artery systolic pressure.   3. The pericardial  effusion is anterior to the right ventricle.   4. The mitral valve is normal in structure. No evidence of mitral valve  regurgitation. No evidence of mitral stenosis.   5. The aortic valve is tricuspid. There is mild calcification of the  aortic valve. Aortic valve regurgitation is mild. Aortic valve sclerosis  is present, with no evidence of aortic valve stenosis.   6. The inferior vena cava is dilated in size with >50% respiratory  variability, suggesting right atrial pressure of 8 mmHg.   Recent Labs: 07/01/2022: ALT 14 07/02/2022: BUN 24; Creatinine, Ser 1.33; Hemoglobin 12.1; Magnesium 2.0; Platelets 236; Potassium 3.9; Sodium 139  No results found for requested labs within last 365 days.   CrCl cannot be calculated (Patient's most recent lab result is older than the maximum 21 days allowed.).   Wt Readings from Last 3 Encounters:  04/17/22 124 lb 6.4 oz (56.4 kg)  01/10/22 122 lb  9.6 oz (55.6 kg)  01/10/22 122 lb 6.4 oz (55.5 kg)     Other studies reviewed: Additional studies/records reviewed today include: summarized above  ASSESSMENT AND PLAN:  Paroxysmal Afib CHA2DS2Vasc is 4, on *** xarelto, appropriately dosed *** %burden *** flecainide  HTN ***  Disposition: F/u with ***  Current medicines are reviewed at length with the patient today.  The patient did not have any concerns regarding medicines.  Venetia Night, PA-C 07/30/2022 4:17 PM     [address] [place] [address] [phone] (office)  [phone] (fax)

## 2022-08-01 ENCOUNTER — Encounter: Payer: Self-pay | Admitting: Physician Assistant

## 2022-08-01 ENCOUNTER — Ambulatory Visit: Payer: Medicare Other | Attending: Physician Assistant | Admitting: Physician Assistant

## 2022-08-01 VITALS — BP 140/78 | HR 97 | Ht 64.0 in | Wt 127.0 lb

## 2022-08-01 DIAGNOSIS — R55 Syncope and collapse: Secondary | ICD-10-CM | POA: Diagnosis not present

## 2022-08-01 DIAGNOSIS — I480 Paroxysmal atrial fibrillation: Secondary | ICD-10-CM

## 2022-08-01 DIAGNOSIS — I10 Essential (primary) hypertension: Secondary | ICD-10-CM

## 2022-08-01 MED ORDER — DILTIAZEM HCL 30 MG PO TABS: 30.0000 mg | ORAL_TABLET | Freq: Four times a day (QID) | ORAL | 1 refills | Status: DC | PRN

## 2022-08-01 NOTE — Patient Instructions (Signed)
Medication Instructions:  Your physician recommends that you continue on your current medications as directed. Please refer to the Current Medication list given to you today.  *If you need a refill on your cardiac medications before your next appointment, please call your pharmacy*   Lab Work: None If you have labs (blood work) drawn today and your tests are completely normal, you will receive your results only by: [place] (if you have MyChart) OR A paper copy in the mail If you have any lab test that is abnormal or we need to change your treatment, we will call you to review the results.   Follow-Up: At [facility], you and your health needs are our priority.  As part of our continuing mission to provide you with exceptional heart care, we have created designated Provider Care Teams.  These Care Teams include your primary Cardiologist (physician) and Advanced Practice Providers (APPs -  Physician Assistants and Nurse Practitioners) who all work together to provide you with the care you need, when you need it.  We recommend signing up for the patient portal called "MyChart".  Sign up information is provided on this After Visit Summary.  MyChart is used to connect with patients for Virtual Visits (Telemedicine).  Patients are able to view lab/test results, encounter notes, upcoming appointments, etc.  Non-urgent messages can be sent to your provider as well.   To learn more about what you can do with MyChart, go to [email or web address].    Your next appointment:   1 month(s)  Provider:   Tommye Standard, PA-C

## 2022-08-02 DIAGNOSIS — K5792 Diverticulitis of intestine, part unspecified, without perforation or abscess without bleeding: Secondary | ICD-10-CM | POA: Diagnosis not present

## 2022-08-02 DIAGNOSIS — R1084 Generalized abdominal pain: Secondary | ICD-10-CM | POA: Diagnosis not present

## 2022-08-02 LAB — CUP PACEART INCLINIC DEVICE CHECK
Date Time Interrogation Session: 20240227121722
Implantable Pulse Generator Implant Date: 20210824

## 2022-08-14 ENCOUNTER — Ambulatory Visit (INDEPENDENT_AMBULATORY_CARE_PROVIDER_SITE_OTHER): Payer: Medicare Other

## 2022-08-14 DIAGNOSIS — R55 Syncope and collapse: Secondary | ICD-10-CM | POA: Diagnosis not present

## 2022-08-16 LAB — CUP PACEART REMOTE DEVICE CHECK
Date Time Interrogation Session: 20240310232058
Implantable Pulse Generator Implant Date: 20210824

## 2022-08-22 DIAGNOSIS — N390 Urinary tract infection, site not specified: Secondary | ICD-10-CM | POA: Diagnosis not present

## 2022-08-28 NOTE — Progress Notes (Signed)
Carelink Summary Report / Loop Recorder 

## 2022-08-30 NOTE — Progress Notes (Unsigned)
Cardiology Office Note Date:  08/30/2022  Patient ID:  [name], [name] 1933-06-01, MRN [id number] PCP:  Ramiro Harvest, PA-C  Electrophysiologist: Dr. Curt Bears    Chief Complaint: *** planned f/u  History of Present Illness: [name] is a 89 y.o. female with history of HTN, PAFib/SVT, chronic bronchitis  Admitted 07/01/22 after a syncopal event, had not eaten much that day, had soe elevated HRs and took her diltiazem, started to feel dizzy, ultimately had a syncopal event in the [place] Loop interrogated, noted that she had been having multiple episodes of SVTs though none of late, started on Flecainide. Unclear cause of her syncope, though treated for diverticultis Discharged 07/02/22  I saw her 08/01/22 She is feeling fairly terrible She has self stopped ost of her medicines, including the fkecainide and xarelto. She reports the antibiotics made her feel awful, terrible GI side effects, though did complete them She reports she has had near syncope in the past and both times associated with times of infection. She is now on Cipro for a UTI.  The antibiotic making all of her joints ache.  This is how she feels from Cipro having been on it before, but told that it was the antibiotic needed to clear her infection so she is "struggling through it". She really does not want to consider another medication. No CP, no palpitations or cardiac awareness, no SOB No near syncope or syncope. She had no bleeding trouble and no particular side effects with Xarelto, but stopped it because she was feeling so bad overall. She doesn't think she has had any AFib, generally can tell and feels like the PRN diltiazem works well in settling it  We discussed at length rational for [facility], she remained unagreeable to resume. Advised diltiazem for some RVR she also was unagreeable to this. Planned to see her once she was off antibiotics, hopefully feeling better with resolution of UTI and revisit  meds  *** symptoms *** PRN dilt working? *** a/c?  Device information MDT LINQ II, implanted 01/27/2020   Past Medical History:  Diagnosis Date   Arm numbness left    Back pain    BCC (basal cell carcinoma of skin) 03/16/1986   Left Shoulder (curet and excision)   BCC (basal cell carcinoma of skin) 03/10/1988   Mid Back (tx p bx)   BCC (basal cell carcinoma of skin) 01/28/1998   Upper Lip (curet and excision)   BCC (basal cell carcinoma of skin) 10/24/2005   Right Mid Back (curet and 5FU)   Bronchitis, chronic (HCC)    Depression    Dizziness    Fatigue    Hypertension    mild   Obstructive bronchiectasis ([place]) 08/28/2007    Followed in Pulmonary clinic/ [facility]/ [place]  Onset ? Age 21 wit bronchiectasis first confirmed 2007  - Alpha one Screen  August 18, 2010 = MM  - See CT Chest 09/29/05 Stable bronchiectasis in right middle lobe and lingula.  Scattered tree in bud opacities throughout the lungs, right greater than left,  nonspecific.   - PFT's 06/21/2012 1.67 (91%) ratio 64 and no change,  DLCO 77% - [place]   Palpitations    SCCA (squamous cell carcinoma) of skin 05/08/2011   Left Hand (in situ) (tx p bx)   Skin cancer    Stress    Superficial basal cell carcinoma (BCC) 07/11/1995   Top Right Upper Leg (curet and 5FU)   Superficial basal cell carcinoma (BCC) 01/28/1998  Right Back, Upper Thigh (curet and cautery)   Superficial basal cell carcinoma (BCC) 02/18/2002   Upper Inner Right Leg (curet and cautery)   Superficial basal cell carcinoma (BCC) 02/18/2002   Lower Abdomen (curet and 5FU)   Superficial basal cell carcinoma (BCC) 01/27/2013   Right Inner Thigh (tx p bx)   Syncope    History of     Past Surgical History:  Procedure Laterality Date   APPENDECTOMY     CESAREAN SECTION  [phone]    Current Outpatient Medications  Medication Sig Dispense Refill   cholecalciferol (VITAMIN D3) 25 MCG (1000 UNIT) tablet Take 1,000 Units by mouth daily.  (Patient not taking: Reported on 08/01/2022)     ciclopirox (PENLAC) 8 % solution Apply 1 Application topically at bedtime. (Patient not taking: Reported on 08/01/2022)     ciprofloxacin (CIPRO) 250 MG tablet Take 250 mg by mouth 2 (two) times daily.     diltiazem (CARDIZEM) 30 MG tablet Take 1 tablet (30 mg total) by mouth 4 (four) times daily as needed (for elevated heart rates). 30 tablet 1   famotidine (PEPCID) 20 MG tablet Take 20 mg by mouth at bedtime.     flecainide (TAMBOCOR) 50 MG tablet Take 1 tablet (50 mg total) by mouth every 12 (twelve) hours. Follow up with cardiology for additional refills and instructions (Patient not taking: Reported on 08/01/2022) 60 tablet 0   HYDROcodone-acetaminophen (NORCO/VICODIN) 5-325 MG tablet Take 1 tablet by mouth 2 (two) times daily as needed for severe pain.     Misc. Devices MISC by Does not apply route. Flutter valve when needed for cough (Patient not taking: Reported on 08/01/2022)     Multiple Minerals-Vitamins (CITRACAL PLUS PO) Take 1 tablet by mouth daily. (Patient not taking: Reported on 08/01/2022)     Multiple Vitamin (MULTIVITAMIN WITH MINERALS) TABS tablet Take 1 tablet by mouth daily. (Patient not taking: Reported on 08/01/2022)     pantoprazole (PROTONIX) 40 MG tablet Take 1 tablet (40 mg total) by mouth daily. 30 tablet 4   Polyethyl Glycol-Propyl Glycol (SYSTANE) 0.4-0.3 % GEL ophthalmic gel Place 1 Application into both eyes 2 (two) times daily. (Patient not taking: Reported on 08/01/2022)     Rivaroxaban (XARELTO) 15 MG TABS tablet Xarelto 15 mg tablet   1 tablet every day by oral route. (Patient not taking: Reported on 04/17/2022)     No current facility-administered medications for this visit.    Allergies:   Hydralazine hcl, Ciprofloxacin, Penicillins, Sulfonamide derivatives, Apixaban, Avelox [moxifloxacin], Band-aid infection defense [bacitracin-polymyxin b], Dome-paste bandage [wound dressings], Mannitol, Multaq [dronedarone],  Nitrofurantoin monohyd macro, Other, Prednisone, Reclast [zoledronic acid], Sulfa antibiotics, and Chlorpheniramine   Social History:  The patient  reports that she has never smoked. She has never used smokeless tobacco. She reports current alcohol use. She reports that she does not use drugs.   Family History:  The patient's family history includes Alcohol abuse in her father; Atrial fibrillation in her mother; Thyroid disease in her mother.  ROS:  Please see the history of present illness.    All other systems are reviewed and otherwise negative.   PHYSICAL EXAM:  VS:  There were no vitals taken for this visit. BMI: There is no height or weight on file to calculate BMI. Well nourished, well developed, in no acute distress HEENT: normocephalic, atraumatic Neck: no JVD, carotid bruits or masses Cardiac:  *** RRR; no significant murmurs, no rubs, or gallops Lungs:  *** CTA b/l, no wheezing,  rhonchi or rales Abd: soft, nontender MS: no deformity, age appropriate atrophy Ext: *** no edema Skin: warm and dry, no rash Neuro:  No gross deficits appreciated Psych: euthymic mood, full affect   EKG:  not done today  Device interrogation done today and reviewed by myself  ***  ECHO: 10/30/2021  1. Left ventricular ejection fraction, by estimation, is 60 to 65%. The  left ventricle has normal function. The left ventricle has no regional  wall motion abnormalities. Left ventricular diastolic parameters were  normal.   2. Right ventricular systolic function is normal. The right ventricular  size is normal. There is normal pulmonary artery systolic pressure.   3. The pericardial effusion is anterior to the right ventricle.   4. The mitral valve is normal in structure. No evidence of mitral valve  regurgitation. No evidence of mitral stenosis.   5. The aortic valve is tricuspid. There is mild calcification of the  aortic valve. Aortic valve regurgitation is mild. Aortic valve sclerosis  is  present, with no evidence of aortic valve stenosis.   6. The inferior vena cava is dilated in size with >50% respiratory  variability, suggesting right atrial pressure of 8 mmHg.   Recent Labs: 07/01/2022: ALT 14 07/02/2022: BUN 24; Creatinine, Ser 1.33; Hemoglobin 12.1; Magnesium 2.0; Platelets 236; Potassium 3.9; Sodium 139  No results found for requested labs within last 365 days.   CrCl cannot be calculated (Patient's most recent lab result is older than the maximum 21 days allowed.).   Wt Readings from Last 3 Encounters:  08/01/22 127 lb (57.6 kg)  04/17/22 124 lb 6.4 oz (56.4 kg)  01/10/22 122 lb 9.6 oz (55.6 kg)     Other studies reviewed: Additional studies/records reviewed today include: summarized above  ASSESSMENT AND PLAN:  Paroxysmal Afib CHA2DS2Vasc is 4, self stopped xarelto Self stopped flecainide 0.9 %burden (since 07/01/22)  *** Long discussion on rational for [facility] and her risk. Recommend she resume Xarelto, she mentions she does think it make her feel bad, and that  she was unable to tolerate Eliquis previously as well. She is at this time not agreeable to resume. She will give it more thought and reconsider if her burden increases. She understands risk of stroke.  *** Discussed RVR and recommended we start at least daily dosing of diltiazem (or another nodal agent) but she is not agreeable at this time   HTN *** Looks ok  Disposition: ***  Current medicines are reviewed at length with the patient today.  The patient did not have any concerns regarding medicines.  Venetia Night, PA-C 08/30/2022 7:45 AM     [facility] [address] [place] [address] [phone] (office)  [phone] (fax)

## 2022-08-31 ENCOUNTER — Ambulatory Visit: Payer: Medicare Other | Attending: Physician Assistant | Admitting: Physician Assistant

## 2022-08-31 ENCOUNTER — Encounter: Payer: Self-pay | Admitting: Physician Assistant

## 2022-08-31 VITALS — BP 178/78 | HR 90 | Ht 64.0 in | Wt 126.0 lb

## 2022-08-31 DIAGNOSIS — I480 Paroxysmal atrial fibrillation: Secondary | ICD-10-CM | POA: Insufficient documentation

## 2022-08-31 DIAGNOSIS — I493 Ventricular premature depolarization: Secondary | ICD-10-CM | POA: Diagnosis not present

## 2022-08-31 DIAGNOSIS — I10 Essential (primary) hypertension: Secondary | ICD-10-CM | POA: Diagnosis not present

## 2022-08-31 DIAGNOSIS — Z79899 Other long term (current) drug therapy: Secondary | ICD-10-CM | POA: Diagnosis not present

## 2022-08-31 DIAGNOSIS — Z4509 Encounter for adjustment and management of other cardiac device: Secondary | ICD-10-CM | POA: Diagnosis not present

## 2022-08-31 LAB — CUP PACEART INCLINIC DEVICE CHECK
Date Time Interrogation Session: 20240328154053
Implantable Pulse Generator Implant Date: 20210824

## 2022-08-31 NOTE — Patient Instructions (Signed)
Medication Instructions:    Your physician recommends that you continue on your current medications as directed. Please refer to the Current Medication list given to you today.  *If you need a refill on your cardiac medications before your next appointment, please call your pharmacy*   Lab Work: [place]   If you have labs (blood work) drawn today and your tests are completely normal, you will receive your results only by: [place] (if you have MyChart) OR A paper copy in the mail If you have any lab test that is abnormal or we need to change your treatment, we will call you to review the results.   Testing/Procedures: NONE ORDERED  TODAY    Follow-Up: At [facility], you and your health needs are our priority.  As part of our continuing mission to provide you with exceptional heart care, we have created designated Provider Care Teams.  These Care Teams include your primary Cardiologist (physician) and Advanced Practice Providers (APPs -  Physician Assistants and Nurse Practitioners) who all work together to provide you with the care you need, when you need it.  We recommend signing up for the patient portal called "MyChart".  Sign up information is provided on this After Visit Summary.  MyChart is used to connect with patients for Virtual Visits (Telemedicine).  Patients are able to view lab/test results, encounter notes, upcoming appointments, etc.  Non-urgent messages can be sent to your provider as well.   To learn more about what you can do with MyChart, go to [email or web address].    Your next appointment:   3 month(s)  Provider:   You may see Will Meredith Leeds, MD or one of the following Advanced Practice Providers on your designated Care Team:   Tommye Standard, [place]   Other Instructions

## 2022-09-05 ENCOUNTER — Other Ambulatory Visit: Payer: Self-pay | Admitting: Gastroenterology

## 2022-09-18 ENCOUNTER — Ambulatory Visit: Payer: Medicare Other

## 2022-09-18 DIAGNOSIS — R55 Syncope and collapse: Secondary | ICD-10-CM | POA: Diagnosis not present

## 2022-09-20 LAB — CUP PACEART REMOTE DEVICE CHECK
Date Time Interrogation Session: 20240414231225
Implantable Pulse Generator Implant Date: 20210824

## 2022-09-26 NOTE — Progress Notes (Signed)
Carelink Summary Report / Loop Recorder 

## 2022-10-11 ENCOUNTER — Encounter (HOSPITAL_COMMUNITY): Payer: Self-pay | Admitting: Gastroenterology

## 2022-10-11 ENCOUNTER — Telehealth: Payer: Self-pay

## 2022-10-11 NOTE — Telephone Encounter (Signed)
Alert received from [employer]:  ILR alert report received. Battery status OK. Normal device function. No new symptom, brady, or pause episodes. One new AF episode  that was 5 hours and 36 minutes. AF burden is 1.6% of the time.  ? OAC.   Per last office visit-Pt self stopped Xarelto.    Will forward to EP APP for awareness.

## 2022-10-11 NOTE — Telephone Encounter (Signed)
Returned call to Pt.  Advised of afib episode.  Pt states she was aware something was going on, she was fatigued and stayed in bed most of the day.  Pt is scheduled for a colonoscopy in one week-would defer restarting OAC until after that procedure.  Encouraged Pt to reach out after her colonoscopy to reconsider starting OAC.  Pt thanked nurse for calling back.  She will contact office after procedure.

## 2022-10-11 NOTE — Progress Notes (Signed)
Attempted to obtain medical history via telephone, unable to reach at this time. HIPAA compliant voicemail message left requesting return call to pre surgical testing department. 

## 2022-10-14 NOTE — Progress Notes (Deleted)
Subjective:    Patient ID: [name], female    DOB: [birth year]    MRN: [id number]      Brief patient profile:  31 yowf never smoker/ MM   with recurrent cough since age 89 with right middle lobe/lingular syndrome with limited bronchiectatic changes on  CT study dated 09/2005      History of Present Illness   03/01/2016  f/u ov/[name] re:  Bronchiectasis with uacs/ some better on gerd rx  Chief Complaint  Patient presents with   Follow-up    Pt states her dry cough is unchanged since last OV. Pt states she has ocassional chest tightness and SOB but attributes this to stress. Pt also c/o hoarseness.    coughs up to an hour each am, completely non-productive even with flutter  rec No change in medications Nasty mucus > doxycline 100 mg twice daily x 10 days   Late add for nodular opacity RUL c/w MAI    zmax x 250 mg daily x 30 days Zmax 250 mg daily 03/01/16 - 05/01/16 improved cough at d/c but diarrhea while on it and no change in nodule RUL > d/c/d 07/07/16    01/10/2022  f/u ov/[name] re: bronchiectasis  maint on flutter valve/ no longer on GERD rx with increasing dysphagia and cough worse p meals    Chief Complaint  Patient presents with   Follow-up    Pt was in hospital at end of May for Diverticulosis. Still having a productive cough with chalky white sputum.  Rec Try prilosec (omepraole)  otc 20mg   Take 30-60 min before first meal of the day and Pepcid ac (famotidine) 20 mg one @  bedtime until cough is completely gone for at least a week without the need for cough suppression       04/17/2022  f/u ov/[name] re: bronchiectasis maint on gerd rx / Schooler / ST rx > better  Chief Complaint  Patient presents with   Follow-up    Cough and clearing throat;  sx worse qam.   Dyspnea:  hips no longer slowing her down  Cough: worse first thing in am white mucus  Sleeping: bed blocks 2 pillows SABA use: none  02: none Rec No change F/u q 41m     10/16/2022 92m  f/u ov/[name] re:  ***   maint on ***  No chief complaint on file.   Dyspnea:  *** Cough: *** Sleeping: *** SABA use: *** 02: *** Covid status:   *** Lung cancer screening :  ***    No obvious day to day or daytime variability or assoc excess/ purulent sputum or mucus plugs or hemoptysis or cp or chest tightness, subjective wheeze or overt sinus or hb symptoms.   *** without nocturnal  or early am exacerbation  of respiratory  c/o's or need for noct saba. Also denies any obvious fluctuation of symptoms with weather or environmental changes or other aggravating or alleviating factors except as outlined above   No unusual exposure hx or h/o childhood pna/ asthma or knowledge of premature birth.  Current Allergies, Complete Past Medical History, Past Surgical History, Family History, and Social History were reviewed in [employer] record.  ROS  The following are not active complaints unless bolded Hoarseness, sore throat, dysphagia, dental problems, itching, sneezing,  nasal congestion or discharge of excess mucus or purulent secretions, ear ache,   fever, chills, sweats, unintended wt loss or wt gain, classically pleuritic or exertional cp,  orthopnea pnd  or arm/hand swelling  or leg swelling, presyncope, palpitations, abdominal pain, anorexia, nausea, vomiting, diarrhea  or change in bowel habits or change in bladder habits, change in stools or change in urine, dysuria, hematuria,  rash, arthralgias, visual complaints, headache, numbness, weakness or ataxia or problems with walking or coordination,  change in mood or  memory.        No outpatient medications have been marked as taking for the 10/16/22 encounter (Appointment) with Nyoka Cowden, MD.              Past Medical History:  BRONCHIECTASIS (ICD-494.0) see CT scan of the chest dated 09/29/2005  - Pneumovax April 28, 2009 (over 65 so last one)  Prevnar given 04/27/2014   - Alpha one Screen  August 18, 2010 = MM  - Sinus  CT August 18, 2010 >>  neg  COUGH, CHRONIC (ICD-786.2)  Left Kidney Cyst  - 04/21/09 [facility] PROCEDURE(S): ULTRASOUND GUIDED AND FLUOROSCOPIC GUIDED LEFT RENAL CYST ASPIRATION AND SCLEROSIS  LUQ Abd pain 2011...............................................................Marland KitchenSchooler  - CT ABD 07/29/10 no etiology        Objective:   Physical Exam  Wt  10/16/2022          *** 04/17/2022        124  01/10/2022           122  08/01/2021         126  01/17/2021         121  10/11/2020           118 08/02/2020         126   02/03/2020        124  Wt  09/19/2019  120  07/22/2018         122  05/20/2018       129  11/30/2017         131  March 15,2012 137       Vital signs reviewed  10/16/2022  - Note at rest 02 sats  ***% on ***   General appearance:    ***            Assessment & Plan:

## 2022-10-16 ENCOUNTER — Ambulatory Visit: Payer: Medicare Other | Admitting: Internal Medicine

## 2022-10-17 NOTE — Anesthesia Preprocedure Evaluation (Signed)
Anesthesia Evaluation  Patient identified by MRN, date of birth, ID band Patient awake    Reviewed: Allergy & Precautions, NPO status , Patient's Chart, lab work & pertinent test results  Airway Mallampati: II  TM Distance: >3 FB Neck ROM: Full    Dental no notable dental hx. (+) Implants, Dental Advisory Given, Teeth Intact   Pulmonary COPD   Pulmonary exam normal breath sounds clear to auscultation       Cardiovascular hypertension, Normal cardiovascular exam+ dysrhythmias Atrial Fibrillation and Supra Ventricular Tachycardia + pacemaker  Rhythm:Regular Rate:Normal  08/2021 TTE  1. Left ventricular ejection fraction, by estimation, is 60 to 65%. The  left ventricle has normal function. The left ventricle has no regional  wall motion abnormalities. Left ventricular diastolic parameters were  normal.   2. Right ventricular systolic function is normal. The right ventricular  size is normal. There is normal pulmonary artery systolic pressure.   3. The pericardial effusion is anterior to the right ventricle.   4. The mitral valve is normal in structure. No evidence of mitral valve  regurgitation. No evidence of mitral stenosis.   5. The aortic valve is tricuspid. There is mild calcification of the  aortic valve. Aortic valve regurgitation is mild. Aortic valve sclerosis  is present, with no evidence of aortic valve stenosis.   6. The inferior vena cava is dilated in size with >50% respiratory  variability, suggesting right atrial pressure of 8 mmHg     Neuro/Psych  PSYCHIATRIC DISORDERS         GI/Hepatic ,GERD  ,,diverticulitis   Endo/Other    Renal/GU Renal disease     Musculoskeletal   Abdominal   Peds  Hematology   Anesthesia Other Findings All; See list  Reproductive/Obstetrics                             Anesthesia Physical Anesthesia Plan  ASA: 3  Anesthesia Plan: MAC   Post-op  Pain Management: Minimal or no pain anticipated   Induction:   PONV Risk Score and Plan: 2 and Propofol infusion and Treatment may vary due to age or medical condition  Airway Management Planned:   Additional Equipment: None  Intra-op Plan:   Post-operative Plan:   Informed Consent: I have reviewed the patients History and Physical, chart, labs and discussed the procedure including the risks, benefits and alternatives for the proposed anesthesia with the patient or authorized representative who has indicated his/her understanding and acceptance.     Dental advisory given  Plan Discussed with: CRNA  Anesthesia Plan Comments:        Anesthesia Quick Evaluation

## 2022-10-18 ENCOUNTER — Encounter (HOSPITAL_COMMUNITY)
Admission: RE | Disposition: A | Discharge status: Home / Self Care | Payer: Self-pay | Source: Home / Self Care | Attending: Gastroenterology

## 2022-10-18 ENCOUNTER — Ambulatory Visit (HOSPITAL_COMMUNITY)
Admission: RE | Admit: 2022-10-18 | Discharge: 2022-10-18 | Disposition: A | Discharge status: Home / Self Care | Payer: Medicare Other | Attending: Gastroenterology | Admitting: Gastroenterology

## 2022-10-18 ENCOUNTER — Ambulatory Visit (HOSPITAL_COMMUNITY): Payer: Medicare Other | Admitting: Anesthesiology

## 2022-10-18 ENCOUNTER — Ambulatory Visit (HOSPITAL_BASED_OUTPATIENT_CLINIC_OR_DEPARTMENT_OTHER): Payer: Medicare Other | Admitting: Anesthesiology

## 2022-10-18 ENCOUNTER — Other Ambulatory Visit: Payer: Self-pay

## 2022-10-18 ENCOUNTER — Encounter (HOSPITAL_COMMUNITY): Payer: Self-pay | Admitting: Gastroenterology

## 2022-10-18 DIAGNOSIS — K5732 Diverticulitis of large intestine without perforation or abscess without bleeding: Secondary | ICD-10-CM

## 2022-10-18 DIAGNOSIS — K5792 Diverticulitis of intestine, part unspecified, without perforation or abscess without bleeding: Secondary | ICD-10-CM | POA: Diagnosis present

## 2022-10-18 DIAGNOSIS — J449 Chronic obstructive pulmonary disease, unspecified: Secondary | ICD-10-CM | POA: Insufficient documentation

## 2022-10-18 DIAGNOSIS — D125 Benign neoplasm of sigmoid colon: Secondary | ICD-10-CM | POA: Diagnosis not present

## 2022-10-18 DIAGNOSIS — Z09 Encounter for follow-up examination after completed treatment for conditions other than malignant neoplasm: Secondary | ICD-10-CM | POA: Diagnosis not present

## 2022-10-18 DIAGNOSIS — K6389 Other specified diseases of intestine: Secondary | ICD-10-CM

## 2022-10-18 DIAGNOSIS — I4891 Unspecified atrial fibrillation: Secondary | ICD-10-CM

## 2022-10-18 DIAGNOSIS — I471 Supraventricular tachycardia, unspecified: Secondary | ICD-10-CM | POA: Insufficient documentation

## 2022-10-18 DIAGNOSIS — K635 Polyp of colon: Secondary | ICD-10-CM | POA: Insufficient documentation

## 2022-10-18 DIAGNOSIS — D122 Benign neoplasm of ascending colon: Secondary | ICD-10-CM | POA: Diagnosis not present

## 2022-10-18 DIAGNOSIS — D126 Benign neoplasm of colon, unspecified: Secondary | ICD-10-CM

## 2022-10-18 DIAGNOSIS — R1084 Generalized abdominal pain: Secondary | ICD-10-CM | POA: Diagnosis not present

## 2022-10-18 DIAGNOSIS — I10 Essential (primary) hypertension: Secondary | ICD-10-CM | POA: Insufficient documentation

## 2022-10-18 DIAGNOSIS — K5790 Diverticulosis of intestine, part unspecified, without perforation or abscess without bleeding: Secondary | ICD-10-CM | POA: Diagnosis not present

## 2022-10-18 DIAGNOSIS — K219 Gastro-esophageal reflux disease without esophagitis: Secondary | ICD-10-CM | POA: Diagnosis not present

## 2022-10-18 DIAGNOSIS — K5730 Diverticulosis of large intestine without perforation or abscess without bleeding: Secondary | ICD-10-CM | POA: Diagnosis not present

## 2022-10-18 DIAGNOSIS — Z950 Presence of cardiac pacemaker: Secondary | ICD-10-CM | POA: Insufficient documentation

## 2022-10-18 DIAGNOSIS — K640 First degree hemorrhoids: Secondary | ICD-10-CM

## 2022-10-18 HISTORY — PX: POLYPECTOMY: SHX5525

## 2022-10-18 HISTORY — PX: COLONOSCOPY WITH PROPOFOL: SHX5780

## 2022-10-18 HISTORY — PX: BIOPSY: SHX5522

## 2022-10-18 SURGERY — COLONOSCOPY WITH PROPOFOL
Anesthesia: Monitor Anesthesia Care

## 2022-10-18 MED ORDER — PROPOFOL 10 MG/ML IV BOLUS
INTRAVENOUS | Status: DC | PRN
  Administered 2022-10-18: 20 mg via INTRAVENOUS

## 2022-10-18 MED ORDER — LIDOCAINE HCL (CARDIAC) PF 100 MG/5ML IV SOSY
PREFILLED_SYRINGE | INTRAVENOUS | Status: DC | PRN
  Administered 2022-10-18: 50 mg via INTRAVENOUS

## 2022-10-18 MED ORDER — PROPOFOL 500 MG/50ML IV EMUL
INTRAVENOUS | Status: DC | PRN
  Administered 2022-10-18: 150 ug/kg/min via INTRAVENOUS

## 2022-10-18 MED ORDER — LACTATED RINGERS IV SOLN: INTRAVENOUS | Status: DC | PRN

## 2022-10-18 SURGICAL SUPPLY — 22 items

## 2022-10-18 NOTE — Interval H&P Note (Signed)
History and Physical Interval Note:  10/18/2022 9:32 AM  [name]  has presented today for surgery, with the diagnosis of diverticulitis.  The various methods of treatment have been discussed with the patient and family. After consideration of risks, benefits and other options for treatment, the patient has consented to  Procedure(s): COLONOSCOPY WITH PROPOFOL (N/A) as a surgical intervention.  The patient's history has been reviewed, patient examined, no change in status, stable for surgery.  I have reviewed the patient's chart and labs.  Questions were answered to the patient's satisfaction.     Shirley Friar

## 2022-10-18 NOTE — H&P (Signed)
Date of Initial H&P: 10/11/22  History reviewed, patient examined, no change in status, stable for surgery.

## 2022-10-18 NOTE — Discharge Instructions (Signed)

## 2022-10-18 NOTE — Anesthesia Postprocedure Evaluation (Signed)
Anesthesia Post Note  Patient: [name]  Procedure(s) Performed: COLONOSCOPY WITH PROPOFOL POLYPECTOMY BIOPSY     Patient location during evaluation: PACU Anesthesia Type: MAC Level of consciousness: awake and alert Pain management: pain level controlled Vital Signs Assessment: post-procedure vital signs reviewed and stable Respiratory status: spontaneous breathing, nonlabored ventilation, respiratory function stable and patient connected to nasal cannula oxygen Cardiovascular status: blood pressure returned to baseline and stable Postop Assessment: no apparent nausea or vomiting Anesthetic complications: no  No notable events documented.  Last Vitals:  Vitals:   10/18/22 1030 10/18/22 1040  BP: (!) 154/93 (!) 180/66  Pulse: 79 74  Resp: (!) 21 (!) 23  Temp:    SpO2: 98% 96%    Last Pain:  Vitals:   10/18/22 1040  TempSrc:   PainSc: 0-No pain                 Trevor Iha

## 2022-10-18 NOTE — Op Note (Signed)
[facility] Patient Name: [name] Procedure Date: 10/18/2022 MRN: [id number] Attending MD: Shirley Friar , MD, [id number] Date of Birth: [birth year] CSN: [id number] Age: 89 Admit Type: Outpatient Procedure:                Colonoscopy Indications:              Last colonoscopy: February 2007, Generalized                            abdominal pain, Follow-up of diverticulitis Providers:                Shirley Friar, MD, Stephens Shire RN, RN,                            Priscella Mann, Technician Referring MD:             Gigi Gin. Breedlove Medicines:                Propofol per Anesthesia, Monitored Anesthesia Care Complications:            No immediate complications. Estimated Blood Loss:     Estimated blood loss was minimal. Procedure:                Pre-Anesthesia Assessment:                           - Prior to the procedure, a History and Physical                            was performed, and patient medications and                            allergies were reviewed. The patient's tolerance of                            previous anesthesia was also reviewed. The risks                            and benefits of the procedure and the sedation                            options and risks were discussed with the patient.                            All questions were answered, and informed consent                            was obtained. Prior Anticoagulants: The patient has                            taken no anticoagulant or antiplatelet agents. ASA                            Grade Assessment: III - A patient with severe  systemic disease. After reviewing the risks and                            benefits, the patient was deemed in satisfactory                            condition to undergo the procedure.                           After obtaining informed consent, the colonoscope                            was passed under direct  vision. Throughout the                            procedure, the patient's blood pressure, pulse, and                            oxygen saturations were monitored continuously. The                            PCF-HQ190L ([id number]) Olympus colonoscope was                            introduced through the anus and advanced to the the                            cecum, identified by appendiceal orifice and                            ileocecal valve. The colonoscopy was performed with                            difficulty due to multiple diverticula in the                            colon, restricted mobility of the colon and a                            tortuous colon. Successful completion of the                            procedure was aided by straightening and shortening                            the scope to obtain bowel loop reduction. The                            patient tolerated the procedure well. The quality                            of the bowel preparation was fair and fair but  repeated irrigation led to a good and adequate prep. Scope In: 9:43:52 AM Scope Out: 10:10:36 AM Scope Withdrawal Time: 0 hours 16 minutes 48 seconds  Total Procedure Duration: 0 hours 26 minutes 44 seconds  Findings:      The perianal and digital rectal examinations were normal.      A 5 mm polyp was found in the ascending colon. The polyp was       semi-pedunculated. The polyp was removed with a hot snare. Resection and       retrieval were complete. Estimated blood loss: none.      Two sessile and semi-sessile polyps were found in the sigmoid colon. The       polyps were 3 to 4 mm in size. These polyps were removed with a cold       biopsy forceps. Resection and retrieval were complete. Estimated blood       loss was minimal.      Multiple large-mouthed, medium-mouthed and small-mouthed diverticula       were found in the entire colon.      Internal hemorrhoids were found  during retroflexion. The hemorrhoids       were medium-sized and Grade I (internal hemorrhoids that do not       prolapse).      The terminal ileum appeared normal.      A segmental area of moderately congested and thickened folds of the       mucosa was found in the sigmoid colon. Impression:               - Preparation of the colon was fair.                           - One 5 mm polyp in the ascending colon, removed                            with a hot snare. Resected and retrieved.                           - Two 3 to 4 mm polyps in the sigmoid colon,                            removed with a cold biopsy forceps. Resected and                            retrieved.                           - Diverticulosis in the entire examined colon.                           - Internal hemorrhoids.                           - The examined portion of the ileum was normal.                           - Congested and thickened folds of the mucosa in  the sigmoid colon. Moderate Sedation:      N/A - MAC procedure Recommendation:           - Patient has a contact number available for                            emergencies. The signs and symptoms of potential                            delayed complications were discussed with the                            patient. Return to normal activities tomorrow.                            Written discharge instructions were provided to the                            patient.                           - High fiber diet.                           - Await pathology results.                           - Repeat colonoscopy for surveillance based on                            pathology results.                           - Continue present medications. Procedure Code(s):        --- Professional ---                           [phone], Colonoscopy, flexible; with removal of                            tumor(s), polyp(s), or other lesion(s) by snare                             technique                           45380, 59, Colonoscopy, flexible; with biopsy,                            single or multiple Diagnosis Code(s):        --- Professional ---                           R10.84, Generalized abdominal pain                           D12.2, Benign neoplasm of ascending colon  D12.5, Benign neoplasm of sigmoid colon                           K57.32, Diverticulitis of large intestine without                            perforation or abscess without bleeding                           K57.30, Diverticulosis of large intestine without                            perforation or abscess without bleeding                           K63.89, Other specified diseases of intestine                           K64.0, First degree hemorrhoids CPT copyright 2022 [employer]. All rights reserved. The codes documented in this report are preliminary and upon coder review may  be revised to meet current compliance requirements. Shirley Friar, MD 10/18/2022 10:26:44 AM This report has been signed electronically. Number of Addenda: 0

## 2022-10-18 NOTE — Transfer of Care (Signed)
Immediate Anesthesia Transfer of Care Note  Patient: Runelle Chop  Procedure(s) Performed: COLONOSCOPY WITH PROPOFOL POLYPECTOMY BIOPSY  Patient Location: Endoscopy Unit  Anesthesia Type:MAC  Level of Consciousness: awake  Airway & Oxygen Therapy: Patient Spontanous Breathing and Patient connected to nasal cannula oxygen  Post-op Assessment: Report given to RN and Post -op Vital signs reviewed and stable  Post vital signs: Reviewed and stable  Last Vitals:  Vitals Value Taken Time  BP    Temp    Pulse 80 10/18/22 1016  Resp 17 10/18/22 1016  SpO2 98 % 10/18/22 1016  Vitals shown include unvalidated device data.  Last Pain:  Vitals:   10/18/22 0830  TempSrc: Temporal  PainSc: 0-No pain         Complications: No notable events documented.

## 2022-10-19 ENCOUNTER — Encounter (HOSPITAL_COMMUNITY): Payer: Self-pay | Admitting: Gastroenterology

## 2022-10-19 LAB — SURGICAL PATHOLOGY

## 2022-10-23 ENCOUNTER — Ambulatory Visit (INDEPENDENT_AMBULATORY_CARE_PROVIDER_SITE_OTHER): Payer: Medicare Other

## 2022-10-23 DIAGNOSIS — R55 Syncope and collapse: Secondary | ICD-10-CM

## 2022-10-23 LAB — CUP PACEART REMOTE DEVICE CHECK
Date Time Interrogation Session: 20240517230647
Implantable Pulse Generator Implant Date: 20210824

## 2022-10-26 NOTE — Progress Notes (Signed)
Carelink Summary Report / Loop Recorder 

## 2022-11-02 DIAGNOSIS — G894 Chronic pain syndrome: Secondary | ICD-10-CM | POA: Diagnosis not present

## 2022-11-02 DIAGNOSIS — I10 Essential (primary) hypertension: Secondary | ICD-10-CM | POA: Diagnosis not present

## 2022-11-02 DIAGNOSIS — M810 Age-related osteoporosis without current pathological fracture: Secondary | ICD-10-CM | POA: Diagnosis not present

## 2022-11-02 DIAGNOSIS — I4811 Longstanding persistent atrial fibrillation: Secondary | ICD-10-CM | POA: Diagnosis not present

## 2022-11-02 DIAGNOSIS — Z0000 Encounter for general adult medical examination without abnormal findings: Secondary | ICD-10-CM | POA: Diagnosis not present

## 2022-11-03 ENCOUNTER — Ambulatory Visit (INDEPENDENT_AMBULATORY_CARE_PROVIDER_SITE_OTHER): Payer: Medicare Other

## 2022-11-03 ENCOUNTER — Ambulatory Visit (INDEPENDENT_AMBULATORY_CARE_PROVIDER_SITE_OTHER): Payer: Medicare Other | Admitting: Internal Medicine

## 2022-11-03 ENCOUNTER — Encounter: Payer: Self-pay | Admitting: Internal Medicine

## 2022-11-03 VITALS — BP 142/82 | HR 77 | Temp 98.4°F | Ht 64.0 in | Wt 126.6 lb

## 2022-11-03 DIAGNOSIS — R058 Other specified cough: Secondary | ICD-10-CM

## 2022-11-03 DIAGNOSIS — J479 Bronchiectasis, uncomplicated: Secondary | ICD-10-CM

## 2022-11-03 DIAGNOSIS — R059 Cough, unspecified: Secondary | ICD-10-CM | POA: Diagnosis not present

## 2022-11-03 DIAGNOSIS — R1311 Dysphagia, oral phase: Secondary | ICD-10-CM

## 2022-11-03 DIAGNOSIS — J439 Emphysema, unspecified: Secondary | ICD-10-CM | POA: Diagnosis not present

## 2022-11-03 MED ORDER — METOPROLOL TARTRATE 25 MG PO TABS: 25.0000 mg | ORAL_TABLET | Freq: Two times a day (BID) | ORAL | 11 refills | Status: DC

## 2022-11-03 NOTE — Patient Instructions (Addendum)
Add lopressor 25 mg twice daily (metaprolol)   Ok to add cardizem 30 mg as needed heart pounding  Please remember to go to the  x-ray department  for your tests - we will call you with the results when they are available    Please schedule a follow up visit in 6  months but call sooner if needed

## 2022-11-03 NOTE — Progress Notes (Unsigned)
Subjective:    Patient ID: [name], female    DOB: [birth year]    MRN: [id number]      Brief patient profile:  75 yowf never smoker/ MM   with recurrent cough since age 89 with right middle lobe/lingular syndrome with limited bronchiectatic changes on  CT study dated 09/2005    History of Present Illness   03/01/2016  f/u ov/[name] re:  Bronchiectasis with uacs/ some better on gerd rx  Chief Complaint  Patient presents with   Follow-up    Pt states her dry cough is unchanged since last OV. Pt states she has ocassional chest tightness and SOB but attributes this to stress. Pt also c/o hoarseness.    coughs up to an hour each am, completely non-productive even with flutter  rec No change in medications Nasty mucus > doxycline 100 mg twice daily x 10 days   Late add for nodular opacity RUL c/w MAI    zmax x 250 mg daily x 30 days Zmax 250 mg daily 03/01/16 - 05/01/16 improved cough at d/c but diarrhea while on it and no change in nodule RUL > d/c/d 07/07/16    01/10/2022  f/u ov/[name] re: bronchiectasis  maint on flutter valve/ no longer on GERD rx with increasing dysphagia and cough worse p meals    Chief Complaint  Patient presents with   Follow-up    Pt was in hospital at end of May for Diverticulosis. Still having a productive cough with chalky white sputum.  Rec Try prilosec (omepraole)  otc 20mg   Take 30-60 min before first meal of the day and Pepcid ac (famotidine) 20 mg one @  bedtime until cough is completely gone for at least a week without the need for cough suppression       04/17/2022  f/u ov/[name] re: bronchiectasis maint on gerd rx / [facility]  rx > better  Chief Complaint  Patient presents with   Follow-up    Cough and clearing throat;  sx worse qam.   Dyspnea:  hips no longer slowing her down  Cough: worse first thing in am white mucus  Sleeping: bed blocks 2 pillows SABA use: none  02: none Rec No change F/u q 50m     11/03/2022 38m  f/u  ov/[name] re: bronchiectasis maint on no rx   Chief Complaint  Patient presents with   Follow-up    C/o weakness in legs. PCP wellness exam yesterday stated heart okay.   Dyspnea:  back /hips/legs = both sides limiting more than sob  Cough: first thing am > no nasty mucus > flutter valve Sleeping: bd blocks/ 2 pillows no resp cc  SABA use: none 02: none  Heart pounding but not using cardizem      No obvious day to day or daytime variability or assoc excess/ purulent sputum or mucus plugs or hemoptysis or cp or chest tightness, subjective wheeze or overt sinus or hb symptoms.   Sleeping  without nocturnal  or early am exacerbation  of respiratory  c/o's or need for noct saba. Also denies any obvious fluctuation of symptoms with weather or environmental changes or other aggravating or alleviating factors except as outlined above   No unusual exposure hx or h/o childhood pna/ asthma or knowledge of premature birth.  Current Allergies, Complete Past Medical History, Past Surgical History, Family History, and Social History were reviewed in [employer] record.  ROS  The following are not active complaints  unless bolded Hoarseness, sore throat, dysphagia, dental problems, itching, sneezing,  nasal congestion or discharge of excess mucus or purulent secretions, ear ache,   fever, chills, sweats, unintended wt loss or wt gain, classically pleuritic or exertional cp,  orthopnea pnd or arm/hand swelling  or leg swelling, presyncope, palpitations, abdominal pain, anorexia, nausea, vomiting, diarrhea  or change in bowel habits or change in bladder habits, change in stools or change in urine, dysuria, hematuria,  rash, arthralgias, visual complaints, headache, numbness, weakness or ataxia or problems with walking or coordination,  change in mood or  memory.        Current Meds  Medication Sig   acetaminophen (TYLENOL) 500 MG tablet Take 500 mg by mouth every 6 (six) hours as  needed.   diltiazem (CARDIZEM) 30 MG tablet Take 1 tablet (30 mg total) by mouth 4 (four) times daily as needed (for elevated heart rates).   HYDROcodone-acetaminophen (NORCO/VICODIN) 5-325 MG tablet Take 1 tablet by mouth 2 (two) times daily as needed for severe pain.   Misc. Devices MISC by Does not apply route. Flutter valve when needed for cough   Polyvinyl Alcohol-Povidone (REFRESH OP) Place 1 drop into both eyes daily as needed (dry eyes).   tiZANidine (ZANAFLEX) 2 MG tablet Take 2 mg by mouth at bedtime.              Past Medical History:  BRONCHIECTASIS (ICD-494.0) see CT scan of the chest dated 09/29/2005  - Pneumovax April 28, 2009 (over 65 so last one)  Prevnar given 04/27/2014   - Alpha one Screen  August 18, 2010 = MM  - Sinus CT August 18, 2010 >>  neg  COUGH, CHRONIC (ICD-786.2)  Left Kidney Cyst  - 04/21/09 [facility] PROCEDURE(S): ULTRASOUND GUIDED AND FLUOROSCOPIC GUIDED LEFT RENAL CYST ASPIRATION AND SCLEROSIS  LUQ Abd pain 2011...............................................................Marland KitchenSchooler  - CT ABD 07/29/10 no etiology        Objective:   Physical Exam  Wt  11/03/2022         126 04/17/2022        124  01/10/2022           122  08/01/2021         126  01/17/2021         121  10/11/2020           118 08/02/2020         126   02/03/2020        124  Wt  09/19/2019  120  07/22/2018         122  05/20/2018       129  11/30/2017         131  March 15,2012 137       Vital signs reviewed  11/03/2022  - Note at rest 02 sats  94% on RA   General appearance:    pleasant elderly wf nad   HEENT : Oropharynx  clear      NECK :  without  apparent JVD/ palpable Nodes/TM    LUNGS: no acc muscle use,  Min barrel  contour chest wall with bilateral  minimal insp/exp rhonchi and  without cough on insp or exp maneuvers and min  Hyperresonant  to  percussion bilaterally    CV:  RRR  no s3 or murmur or increase in P2, and no edema   ABD:  soft and nontender with pos end   insp Hoover's  in the supine position.  No  bruits or organomegaly appreciated   MS:  Nl gait/ ext warm without deformities Or obvious joint restrictions  calf tenderness, cyanosis or clubbing     SKIN: warm and dry without lesions    NEURO:  alert, approp, nl sensorium with  no motor or cerebellar deficits apparent.         CXR PA and Lateral:   11/03/2022 :    I personally reviewed images and impression is as follows:     Slt increase in airway  markings esp R side           Assessment & Plan:

## 2022-11-05 NOTE — Assessment & Plan Note (Signed)
02/21/2022  Mild risk  recs Diet Recommendations  SLP Diet Recommendations Regular solids;Thin liquid  Liquid Administration via Straw;Cup  Medication Administration Whole meds with liquid  Compensations Slow rate;Small sips/bites  Postural Changes Remain semi-upright after after feeds/meals (Comment);Seated upright at 90 degrees     Reviewed with pt          Each maintenance medication was reviewed in detail including emphasizing most importantly the difference between maintenance and prns and under what circumstances the prns are to be triggered using an action plan format where appropriate.  Total time for H and P, chart review, counseling, reviewing flutter  device(s) and generating customized AVS unique to this office visit / same day charting = 25 min

## 2022-11-05 NOTE — Assessment & Plan Note (Signed)
Onset ? Age 89 wit bronchiectasis first confirmed 2007  - Alpha one Screen  August 18, 2010 = MM  - See CT Chest 09/29/05 Stable bronchiectasis in right middle lobe and lingula.  Scattered tree in bud opacities throughout the lungs, right greater than left,  nonspecific.   - PFT's 06/21/2012 1.67 (91%) ratio 64 and no change,  DLCO 77% - Flutter valve added 04/27/2014  - PFTs 05/24/15   FEV1 1.55 (81%) ratio 69 p 6% improvement from saba and dlco 62% ratio 88%  - HRCT 04/04/16 >>> 1. 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 2. An incidental finding of potential clinical significance has been found. Dominant nodule in the right upper lobe, likely infectious/inflammatory in etiology.  - Zmax 250 mg daily 03/01/16 - 05/01/16 improved cough at d/c but diarrhea while on it and no change in nodule RUL > d/c 07/07/16 - 08/28/2017 added back cycles of zpak prn flare of purulent bronchitis > d/c 05/20/2018  - levaquin 500 x 10 days prn 06/11/2018 - flutter valve training 06/24/18 Chest CT 09/09/2018 Increased tree-in-bud nodularity in right lower lobe since 2016 -10/11/2020  new L lung mid nodule noted and likely inflammatory  - 01/17/2021    Am sputum samples>>>not done as of 08/01/2021 and cxr worse so rec BAL but get HRCT 1st  - 08/09/21  HRCT minimal change since 2017 so no BAL indicated  - 01/10/2022 reported cough worse p supper > resume gerd rx and do  MBS  (see uacs)   Unfortunately really nothing else to offer esp given her number of reported abx allergies - fortunately not dealing with refractory purulent sputum.

## 2022-11-05 NOTE — Assessment & Plan Note (Addendum)
Trial of max gerd rx 11/24/2015 >> worse off rx 03/29/2016 and 08/28/2017 > resume ppi qam ac> resolved as of 11/30/2017   - Flared again 04/2018 > rechallenged 06/11/2018 and did not follow instructions> resolved 07/22/2018  - worse again 09/19/2019 rec increase pepcid to 20 mg bid and diet/bed blocks, f/u ent prn ([facility]) - MBS  02/21/22  see dyshagia

## 2022-11-09 ENCOUNTER — Other Ambulatory Visit: Payer: Self-pay | Admitting: Internal Medicine

## 2022-11-09 MED ORDER — LEVOFLOXACIN 500 MG PO TABS: 500.0000 mg | ORAL_TABLET | Freq: Every day | ORAL | 0 refills | Status: DC

## 2022-11-09 NOTE — Progress Notes (Signed)
Spoke with pt and notified of results per Dr. Sherene Sires. Pt verbalized understanding and denied any questions. Rx sent to pharm and appt with [facility].

## 2022-11-21 NOTE — Progress Notes (Signed)
Carelink Summary Report / Loop Recorder 

## 2022-11-23 ENCOUNTER — Ambulatory Visit: Payer: Medicare Other | Attending: Physician Assistant | Admitting: Physician Assistant

## 2022-11-23 ENCOUNTER — Encounter: Payer: Self-pay | Admitting: Physician Assistant

## 2022-11-27 ENCOUNTER — Ambulatory Visit (INDEPENDENT_AMBULATORY_CARE_PROVIDER_SITE_OTHER): Payer: Medicare Other

## 2022-11-27 DIAGNOSIS — R55 Syncope and collapse: Secondary | ICD-10-CM | POA: Diagnosis not present

## 2022-11-27 LAB — CUP PACEART REMOTE DEVICE CHECK
Date Time Interrogation Session: 20240623230908
Implantable Pulse Generator Implant Date: 20210824

## 2022-12-18 NOTE — Progress Notes (Signed)
 [facility] Summary Report / Loop Recorder 

## 2023-01-01 ENCOUNTER — Ambulatory Visit (INDEPENDENT_AMBULATORY_CARE_PROVIDER_SITE_OTHER): Payer: Medicare Other

## 2023-01-01 DIAGNOSIS — R55 Syncope and collapse: Secondary | ICD-10-CM | POA: Diagnosis not present

## 2023-01-02 ENCOUNTER — Encounter: Payer: Self-pay | Admitting: Pulmonary Disease

## 2023-01-02 ENCOUNTER — Ambulatory Visit: Payer: Medicare Other | Admitting: Pulmonary Disease

## 2023-01-02 VITALS — BP 122/68 | HR 86 | Ht 64.0 in | Wt 123.6 lb

## 2023-01-02 DIAGNOSIS — J479 Bronchiectasis, uncomplicated: Secondary | ICD-10-CM | POA: Diagnosis not present

## 2023-01-02 NOTE — Patient Instructions (Signed)
Nice to meet you  For the nasal sinus and ear congestion, use Afrin 2 sprays each nostril twice a day for 3 days then stop  Try Mucinex D  Consider adding loratadine or generic Claritin in the coming days if you are not getting relief  If you still are experiencing shortness of breath in the coming weeks despite improvement in the congestion, use Stiolto 2 puffs once a day in the morning.  See if this helps with shortness of breath  I provided some sputum cups, lets see if you can cough some mucus into the stomach and sent to the lab to see if anything grows  Return to clinic in 3 months or sooner as needed with Dr. Judeth Horn

## 2023-01-09 ENCOUNTER — Encounter: Payer: Self-pay | Admitting: Podiatry

## 2023-01-09 ENCOUNTER — Ambulatory Visit (INDEPENDENT_AMBULATORY_CARE_PROVIDER_SITE_OTHER): Payer: Medicare Other | Admitting: Podiatry

## 2023-01-09 DIAGNOSIS — L603 Nail dystrophy: Secondary | ICD-10-CM

## 2023-01-09 DIAGNOSIS — B351 Tinea unguium: Secondary | ICD-10-CM | POA: Diagnosis not present

## 2023-01-09 NOTE — Progress Notes (Signed)
Subjective:  Patient ID: [name], female    DOB: 08-09-1932,  MRN: [id number] HPI Chief Complaint  Patient presents with   Debridement    Toenails are hard to cut, discolored and thick, tried OTC meds-no help, also gets discoloration in toes   New Patient (Initial Visit)    Est pt 83    89 y.o. female presents with the above complaint.   ROS: Denies fever chills nausea vomit muscle aches pains calf pain back pain chest pain shortness of breath.  Past Medical History:  Diagnosis Date   Arm numbness left    Back pain    BCC (basal cell carcinoma of skin) 03/16/1986   Left Shoulder (curet and excision)   BCC (basal cell carcinoma of skin) 03/10/1988   Mid Back (tx p bx)   BCC (basal cell carcinoma of skin) 01/28/1998   Upper Lip (curet and excision)   BCC (basal cell carcinoma of skin) 10/24/2005   Right Mid Back (curet and 5FU)   Bronchitis, chronic (HCC)    Depression    Dizziness    Fatigue    Hypertension    mild   Obstructive bronchiectasis (HCC) 08/28/2007    Followed in Pulmonary clinic/ [facility]/ [facility]  Onset ? Age 8 wit bronchiectasis first confirmed 2007  - Alpha one Screen  August 18, 2010 = MM  - See CT Chest 09/29/05 Stable bronchiectasis in right middle lobe and lingula.  Scattered tree in bud opacities throughout the lungs, right greater than left,  nonspecific.   - PFT's 06/21/2012 1.67 (91%) ratio 64 and no change,  DLCO 77% - Flutte   Palpitations    SCCA (squamous cell carcinoma) of skin 05/08/2011   Left Hand (in situ) (tx p bx)   Skin cancer    Stress    Superficial basal cell carcinoma (BCC) 07/11/1995   Top Right Upper Leg (curet and 5FU)   Superficial basal cell carcinoma (BCC) 01/28/1998   Right Back, Upper Thigh (curet and cautery)   Superficial basal cell carcinoma (BCC) 02/18/2002   Upper Inner Right Leg (curet and cautery)   Superficial basal cell carcinoma (BCC) 02/18/2002   Lower Abdomen (curet and 5FU)   Superficial basal  cell carcinoma (BCC) 01/27/2013   Right Inner Thigh (tx p bx)   Syncope    History of    Past Surgical History:  Procedure Laterality Date   APPENDECTOMY     BIOPSY  10/18/2022   Procedure: BIOPSY;  Surgeon: Charlott Rakes, MD;  Location: [facility];  Service: Gastroenterology;;   CESAREAN SECTION  [phone]   COLONOSCOPY WITH PROPOFOL N/A 10/18/2022   Procedure: COLONOSCOPY WITH PROPOFOL;  Surgeon: Charlott Rakes, MD;  Location: [facility];  Service: Gastroenterology;  Laterality: N/A;   POLYPECTOMY  10/18/2022   Procedure: POLYPECTOMY;  Surgeon: Charlott Rakes, MD;  Location: [facility];  Service: Gastroenterology;;    Current Outpatient Medications:    acetaminophen (TYLENOL) 500 MG tablet, Take 500 mg by mouth every 6 (six) hours as needed., Disp: , Rfl:    diltiazem (CARDIZEM) 30 MG tablet, Take 1 tablet (30 mg total) by mouth 4 (four) times daily as needed (for elevated heart rates)., Disp: 30 tablet, Rfl: 1   HYDROcodone-acetaminophen (NORCO/VICODIN) 5-325 MG tablet, Take 1 tablet by mouth 2 (two) times daily as needed for severe pain., Disp: , Rfl:    metoprolol tartrate (LOPRESSOR) 25 MG tablet, Take 1 tablet (25 mg total) by mouth 2 (two) times daily., Disp: 60 tablet, Rfl:  11   Misc. Devices MISC, by Does not apply route. Flutter valve when needed for cough, Disp: , Rfl:    Polyvinyl Alcohol-Povidone (REFRESH OP), Place 1 drop into both eyes daily as needed (dry eyes)., Disp: , Rfl:   Allergies  Allergen Reactions   Hydralazine Hcl Shortness Of Breath    With throat numbness/tingling and swelling   Ciprofloxacin Other (See Comments)    triggered a migraine that was thought to be a TIA   Penicillins Rash    Has patient had a PCN reaction causing immediate rash, facial/tongue/throat swelling, SOB or lightheadedness with hypotension: Yes Has patient had a PCN reaction causing severe rash involving mucus membranes or skin necrosis: No Has patient had a PCN  reaction that required hospitalization: No Has patient had a PCN reaction occurring within the last 10 years: No If all of the above answers are "NO", then may proceed with Cephalosporin use.   Sulfonamide Derivatives Swelling   Apixaban Other (See Comments)    weakness   Avelox [Moxifloxacin] Other (See Comments)    Rash and severe tingling/numbness    Band-Aid Infection Defense [Bacitracin-Polymyxin B] Other (See Comments)    unknown   Dome-Paste Bandage [Wound Dressings] Hives   Mannitol     Unknown reaction   Multaq [Dronedarone]     Muscle aches   Nitrofurantoin Monohyd Macro      due to lung disease   Other Other (See Comments)    Water for injection, sterile - unknown   Reclast [Zoledronic Acid] Other (See Comments)    hypertension   Sulfa Antibiotics Other (See Comments)    unknown   Chlorpheniramine Other (See Comments)    Headache, stomach ache, bladder "not working well"   Prednisone Rash   Review of Systems Objective:  There were no vitals filed for this visit.  General: Well developed, nourished, in no acute distress, alert and oriented x3   Dermatological: Skin is warm, dry and supple bilateral. Nails x 10 are well maintained; remaining integument appears unremarkable at this time. There are no open sores, no preulcerative lesions, no rash or signs of infection present.  Hallux nail and third nail bilaterally are completely consumed yellow thick nails with subungual debris  Vascular: Dorsalis Pedis artery and Posterior Tibial artery pedal pulses are 2/4 bilateral with immedate capillary fill time. Pedal hair growth present. No varicosities and no lower extremity edema present bilateral.   Neruologic: Grossly intact via light touch bilateral. Vibratory intact via tuning fork bilateral. Protective threshold with Semmes Wienstein monofilament intact to all pedal sites bilateral. Patellar and Achilles deep tendon reflexes 2+ bilateral. No Babinski or clonus noted  bilateral.   Musculoskeletal: No gross boney pedal deformities bilateral. No pain, crepitus, or limitation noted with foot and ankle range of motion bilateral. Muscular strength 5/5 in all groups tested bilateral.  Gait: Unassisted, Nonantalgic.    Radiographs:  None taken  Assessment & Plan:   Assessment: Nail dystrophy  Plan: Samples of the skin and nail were taken today for pathologic evaluation.    She does not want to take oral medications that you have to test her liver for.      T. [place], [place]

## 2023-01-16 DIAGNOSIS — H26491 Other secondary cataract, right eye: Secondary | ICD-10-CM | POA: Diagnosis not present

## 2023-01-16 DIAGNOSIS — Z961 Presence of intraocular lens: Secondary | ICD-10-CM | POA: Diagnosis not present

## 2023-01-16 DIAGNOSIS — H04123 Dry eye syndrome of bilateral lacrimal glands: Secondary | ICD-10-CM | POA: Diagnosis not present

## 2023-01-18 NOTE — Progress Notes (Signed)
Carelink Summary Report / Loop Recorder 

## 2023-01-19 ENCOUNTER — Ambulatory Visit: Payer: Medicare Other | Attending: Physician Assistant | Admitting: Physician Assistant

## 2023-01-19 ENCOUNTER — Encounter: Payer: Self-pay | Admitting: Physician Assistant

## 2023-01-19 VITALS — BP 148/78 | HR 93 | Ht 64.0 in | Wt 123.0 lb

## 2023-01-19 DIAGNOSIS — Z79899 Other long term (current) drug therapy: Secondary | ICD-10-CM | POA: Diagnosis not present

## 2023-01-19 DIAGNOSIS — I10 Essential (primary) hypertension: Secondary | ICD-10-CM

## 2023-01-19 DIAGNOSIS — Z4509 Encounter for adjustment and management of other cardiac device: Secondary | ICD-10-CM

## 2023-01-19 DIAGNOSIS — I480 Paroxysmal atrial fibrillation: Secondary | ICD-10-CM | POA: Diagnosis not present

## 2023-01-19 NOTE — Patient Instructions (Signed)
Medication Instructions:   STOP TAKING METOPROLOL   *If you need a refill on your cardiac medications before your next appointment, please call your pharmacy*   Lab Work: BMET AND CBC TODAY    If you have labs (blood work) drawn today and your tests are completely normal, you will receive your results only by: MyChart Message (if you have MyChart) OR A paper copy in the mail If you have any lab test that is abnormal or we need to change your treatment, we will call you to review the results.   Testing/Procedures: NONE ORDERED  TODAY      Follow-Up: At [facility], you and your health needs are our priority.  As part of our continuing mission to provide you with exceptional heart care, we have created designated Provider Care Teams.  These Care Teams include your primary Cardiologist (physician) and Advanced Practice Providers (APPs -  Physician Assistants and Nurse Practitioners) who all work together to provide you with the care you need, when you need it.  We recommend signing up for the patient portal called "MyChart".  Sign up information is provided on this After Visit Summary.  MyChart is used to connect with patients for Virtual Visits (Telemedicine).  Patients are able to view lab/test results, encounter notes, upcoming appointments, etc.  Non-urgent messages can be sent to your provider as well.   To learn more about what you can do with MyChart, go to [email or web address].    Your next appointment:   3 month(s)  Provider:   You may see Will Jorja Loa, MD or one of the following Advanced Practice Providers on your designated Care Team:   Francis Dowse, [place] [address] [place], [place] Sherie Don, NP Canary Brim, NP    Other Instructions

## 2023-01-19 NOTE — Progress Notes (Signed)
Cardiology Office Note Date:  01/19/2023  Patient ID:  [name], [name] [birth year], MRN [id number] PCP:  Sheliah Hatch, PA-C  Electrophysiologist: Dr. Elberta Fortis    Chief Complaint:  planned f/u  History of Present Illness: [name] is a 89 y.o. female with history of HTN, PAFib/SVT, chronic bronchitis  Admitted 07/01/22 after a syncopal event, had not eaten much that day, had soe elevated HRs and took her diltiazem, started to feel dizzy, ultimately had a syncopal event in the [place] Loop interrogated, noted that she had been having multiple episodes of SVTs though none of late, started on Flecainide. Unclear cause of her syncope, though treated for diverticultis Discharged 07/02/22  I saw her 08/01/22 She is feeling fairly terrible She has self stopped cost of her medicines, including the flecainide and xarelto. She reports the antibiotics made her feel awful, terrible GI side effects, though did complete them She reports she has had near syncope in the past and both times associated with times of infection. She is now on Cipro for a UTI.  The antibiotic making all of her joints ache.  This is how she feels from Cipro having been on it before, but told that it was the antibiotic needed to clear her infection so she is "struggling through it". She really does not want to consider another medication. No CP, no palpitations or cardiac awareness, no SOB No near syncope or syncope. She had no bleeding trouble and no particular side effects with Xarelto, but stopped it because she was feeling so bad overall. She doesn't think she has had any AFib, generally can tell and feels like the PRN diltiazem works well in settling it  We discussed at length rational for [facility], she remained unagreeable to resume. Advised diltiazem for some RVR she also was unagreeable to this. Planned to see her once she was off antibiotics, hopefully feeling better with resolution of UTI and revisit  meds  I saw her 11/23/22 She never really feels well, but does feel better, very much attributes that to being off the medications and completing the antibiotics She does not think she had had any Afib No CP, palpitations No near syncope or syncope. Chronic cough with her bronchiectasis Remained off AAD and OAC, did not want to resume Noted to have some PVCs with her ILR interrogation and PVC counter turned on to assess burden  TODAY She is alone today Still just not feeling well. Goes back to January, reminisces on how terrible she felt, and how the antibiotics nearly killed her, and she has just never felt well again. No CP, palpitations. She is tired all of the time She is not taking metoprolol on her list, never started it Does have PRN dilt but rarely feels the need to take it She feels a constant sense of low grade dizziness, no energy. 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 No near syncope or syncope.  She requests labs today to see if that may explain her ongoing fatigue   Device information MDT LINQ II, implanted 01/27/2020   Past Medical History:  Diagnosis Date   Arm numbness left    Back pain    BCC (basal cell carcinoma of skin) 03/16/1986   Left Shoulder (curet and excision)   BCC (basal cell carcinoma of skin) 03/10/1988   Mid Back (tx p bx)   BCC (basal cell carcinoma of skin) 01/28/1998   Upper Lip (curet and excision)   BCC (basal cell carcinoma  of skin) 10/24/2005   Right Mid Back (curet and 5FU)   Bronchitis, chronic (HCC)    Depression    Dizziness    Fatigue    Hypertension    mild   Obstructive bronchiectasis (HCC) 08/28/2007    Followed in Pulmonary clinic/ [facility]/ [facility]  Onset ? Age 61 wit bronchiectasis first confirmed 2007  - Alpha one Screen  August 18, 2010 = MM  - See CT Chest 09/29/05 Stable bronchiectasis in right middle lobe and lingula.  Scattered tree in bud opacities throughout the lungs, right greater than  left,  nonspecific.   - PFT's 06/21/2012 1.67 (91%) ratio 64 and no change,  DLCO 77% - Flutte   Palpitations    SCCA (squamous cell carcinoma) of skin 05/08/2011   Left Hand (in situ) (tx p bx)   Skin cancer    Stress    Superficial basal cell carcinoma (BCC) 07/11/1995   Top Right Upper Leg (curet and 5FU)   Superficial basal cell carcinoma (BCC) 01/28/1998   Right Back, Upper Thigh (curet and cautery)   Superficial basal cell carcinoma (BCC) 02/18/2002   Upper Inner Right Leg (curet and cautery)   Superficial basal cell carcinoma (BCC) 02/18/2002   Lower Abdomen (curet and 5FU)   Superficial basal cell carcinoma (BCC) 01/27/2013   Right Inner Thigh (tx p bx)   Syncope    History of     Past Surgical History:  Procedure Laterality Date   APPENDECTOMY     BIOPSY  10/18/2022   Procedure: BIOPSY;  Surgeon: Charlott Rakes, MD;  Location: [facility];  Service: Gastroenterology;;   CESAREAN SECTION  [phone]   COLONOSCOPY WITH PROPOFOL N/A 10/18/2022   Procedure: COLONOSCOPY WITH PROPOFOL;  Surgeon: Charlott Rakes, MD;  Location: [facility];  Service: Gastroenterology;  Laterality: N/A;   POLYPECTOMY  10/18/2022   Procedure: POLYPECTOMY;  Surgeon: Charlott Rakes, MD;  Location: [facility];  Service: Gastroenterology;;    Current Outpatient Medications  Medication Sig Dispense Refill   acetaminophen (TYLENOL) 500 MG tablet Take 500 mg by mouth every 6 (six) hours as needed.     diltiazem (CARDIZEM) 30 MG tablet Take 1 tablet (30 mg total) by mouth 4 (four) times daily as needed (for elevated heart rates). 30 tablet 1   HYDROcodone-acetaminophen (NORCO/VICODIN) 5-325 MG tablet Take 1 tablet by mouth 2 (two) times daily as needed for severe pain.     metoprolol tartrate (LOPRESSOR) 25 MG tablet Take 1 tablet (25 mg total) by mouth 2 (two) times daily. 60 tablet 11   Misc. Devices MISC by Does not apply route. Flutter valve when needed for cough     Polyvinyl  Alcohol-Povidone (REFRESH OP) Place 1 drop into both eyes daily as needed (dry eyes).     No current facility-administered medications for this visit.    Allergies:   Hydralazine hcl, Ciprofloxacin, Penicillins, Sulfonamide derivatives, Apixaban, Avelox [moxifloxacin], Band-aid infection defense [bacitracin-polymyxin b], Dome-paste bandage [wound dressings], Mannitol, Multaq [dronedarone], Nitrofurantoin monohyd macro, Other, Reclast [zoledronic acid], Sulfa antibiotics, Chlorpheniramine, and Prednisone   Social History:  The patient  reports that she has never smoked. She has never used smokeless tobacco. She reports current alcohol use. She reports that she does not use drugs.   Family History:  The patient's family history includes Alcohol abuse in her father; Atrial fibrillation in her mother; Thyroid disease in her mother.  ROS:  Please see the history of present illness.    All other systems are reviewed  and otherwise negative.   PHYSICAL EXAM:  VS:  There were no vitals taken for this visit. BMI: There is no height or weight on file to calculate BMI. Well nourished, thin body habitus, extremely well dressed, well developed, in no acute distress HEENT: normocephalic, atraumatic Neck: no JVD, carotid bruits or masses Cardiac:   RRR; no significant murmurs, no rubs, or gallops Lungs:  CTA b/l, no wheezing, rhonchi or rales Abd: soft, nontender MS: no deformity, age appropriate atrophy Ext:  no edema Skin: warm and dry, no rash Neuro:  No gross deficits appreciated Psych: euthymic mood, full affect   EKG: not done today  Device interrogation done today and reviewed by myself  Battery is good Counters not 7 tachy, 3 AFib in current bin, though no tracings [facility] also reviewed given transmission today No tracings there either with a lower event counter Nothing since May + PVCs (2%) HR histograms look good No brady, no tachy AF burden 0.3%  ECHO: 10/30/2021  1. Left  ventricular ejection fraction, by estimation, is 60 to 65%. The  left ventricle has normal function. The left ventricle has no regional  wall motion abnormalities. Left ventricular diastolic parameters were  normal.   2. Right ventricular systolic function is normal. The right ventricular  size is normal. There is normal pulmonary artery systolic pressure.   3. The pericardial effusion is anterior to the right ventricle.   4. The mitral valve is normal in structure. No evidence of mitral valve  regurgitation. No evidence of mitral stenosis.   5. The aortic valve is tricuspid. There is mild calcification of the  aortic valve. Aortic valve regurgitation is mild. Aortic valve sclerosis  is present, with no evidence of aortic valve stenosis.   6. The inferior vena cava is dilated in size with >50% respiratory  variability, suggesting right atrial pressure of 8 mmHg.   Recent Labs: 07/01/2022: ALT 14 07/02/2022: BUN 24; Creatinine, Ser 1.33; Hemoglobin 12.1; Magnesium 2.0; Platelets 236; Potassium 3.9; Sodium 139  No results found for requested labs within last 365 days.   CrCl cannot be calculated (Patient's most recent lab result is older than the maximum 21 days allowed.).   Wt Readings from Last 3 Encounters:  01/02/23 123 lb 9.6 oz (56.1 kg)  11/03/22 126 lb 9.6 oz (57.4 kg)  10/18/22 120 lb (54.4 kg)     Other studies reviewed: Additional studies/records reviewed today include: summarized above  ASSESSMENT AND PLAN:  Paroxysmal Afib CHA2DS2Vasc is 4, self stopped xarelto Self stopped flecainide 0.3 %burden (since last month)  We have had a long discussions in the past on rational for [facility] and her risk. She does not want to resume meds Understands stroke risk  HTN Repeat BP by myself is 148/78 She reports 120'/70's at home Denies seeing high or low readings She thinks her BP was high because it wsas windy out put her into a coughing fit prior to coming in  I have asked  her to continue to monitor her BP at home and let us know if elevated   3. PVC No symptoms Low burden Doubt source for her fatigue  4. Fatigue No unusual SON, she has chronic cough Will get labs I have advised she also discuss with her PMD, for any other potential etiologies Asked her to try and walk/mobilize some more as she is able  After our visit completes, have reconsidered and will get an echo.  I will ask my MA to reach out  to her and order.  5. Secondary hypercoagulable state   Disposition: will have her back in 3 mo again, sooner if needed    Current medicines are reviewed at length with the patient today.  The patient did not have any concerns regarding medicines.  Rita Fredrickson, PA-C 01/19/2023 7:35 AM     [facility] [address] [place] [address] [phone] (office)  [phone] (fax)

## 2023-01-20 LAB — CUP PACEART INCLINIC DEVICE CHECK
Date Time Interrogation Session: 20240816073826
Implantable Pulse Generator Implant Date: 20210824

## 2023-01-20 LAB — CBC
Hematocrit: 42.6 % (ref 34.0–46.6)
Hemoglobin: 14.3 g/dL (ref 11.1–15.9)
MCH: 30.5 pg (ref 26.6–33.0)
MCHC: 33.6 g/dL (ref 31.5–35.7)
MCV: 91 fL (ref 79–97)
Platelets: 319 10*3/uL (ref 150–450)
RBC: 4.69 x10E6/uL (ref 3.77–5.28)
RDW: 13.1 % (ref 11.7–15.4)
WBC: 11 10*3/uL — ABNORMAL HIGH (ref 3.4–10.8)

## 2023-01-20 LAB — BASIC METABOLIC PANEL
BUN/Creatinine Ratio: 16 (ref 12–28)
BUN: 14 mg/dL (ref 8–27)
CO2: 25 mmol/L (ref 20–29)
Calcium: 9.6 mg/dL (ref 8.7–10.3)
Chloride: 101 mmol/L (ref 96–106)
Creatinine, Ser: 0.87 mg/dL (ref 0.57–1.00)
Glucose: 79 mg/dL (ref 70–99)
Potassium: 4.6 mmol/L (ref 3.5–5.2)
Sodium: 138 mmol/L (ref 134–144)
eGFR: 64 mL/min/{1.73_m2} (ref >59)

## 2023-01-22 ENCOUNTER — Other Ambulatory Visit: Payer: Self-pay | Admitting: *Deleted

## 2023-01-22 ENCOUNTER — Telehealth: Payer: Self-pay | Admitting: *Deleted

## 2023-01-22 DIAGNOSIS — R0602 Shortness of breath: Secondary | ICD-10-CM

## 2023-01-22 NOTE — Telephone Encounter (Signed)
-----   Message from Sheilah Pigeon sent at 01/19/2023  6:42 PM EDT ----- I have thought more about her, can you order an echo for SOB, fatigue, let her know as well. Thanks

## 2023-01-22 NOTE — Telephone Encounter (Signed)
Spoke with patient aware of new recommendation Per Keitha Butte for echocardiogram for shortness of breath and fatigue. Patient scheduled 02-09-23 @ 4pm  Patient aware also will call back with feedback on results once  provider has read.

## 2023-01-24 ENCOUNTER — Telehealth: Payer: Self-pay

## 2023-01-24 NOTE — Telephone Encounter (Signed)
Consulted with Francis Dowse, PA-C who recommends the following: Take the diltiazem Consider AF clinic if willing given symptoms. If symptoms escalate, go to ER.   Spoke with patient.  She reports that she is feeling much better now.  Her blood pressure is running 108/55. In office can run 114-178/70's.  She will push fluids and keep herself well hydrated.  Agrees to take the diltiazem and consider AF clinic, but not wanting appt at this point.  Knows to call 911/go to ER if symptoms worsen.   Patient will continue to monitor her bp as she is taking the diltiazem, if readings drop, she will hold med and contact our office

## 2023-01-24 NOTE — Telephone Encounter (Signed)
Patient calls in reporting concerns she is back in AF. Symptoms: sweating, clammy, palpitations, weakness and frequent urination this am. She denies any CP, dizziness, visual changes or changes in her baseline SOB.   Feels better now after taking her diltiazem 30mg .   Saw Francis Dowse, PA-C on 01/19/23.  Refuses to take OAC, diltiazem, metoprolol.  stopped her flecainide, only agrees to taking prn diltiazem.     ILR report shows patient in AF at time of current ECG this am.  Appears went in AF around 4:48am.  Cannot push a transmission with Linq2's.   Offered 11am Afib clinic appt, patient refused.  She is going to get up and get some fluids in her and eat. Is feeling better as morning progresses.

## 2023-01-24 NOTE — Telephone Encounter (Signed)
Pt wants to know if she  is in A-fib. I let her speak with Mandy, rn.

## 2023-01-26 ENCOUNTER — Telehealth: Payer: Self-pay | Admitting: Physician Assistant

## 2023-01-26 NOTE — Telephone Encounter (Signed)
  Sheilah Pigeon, PA-C 01/24/2023  4:18 PM EDT Back to Top    Labs are good. Nothing to explain her fatigue, dizziness, feeling poorly    She verbalized understanding.

## 2023-01-26 NOTE — Telephone Encounter (Signed)
Pt calling to f/u on lab results. Please advise 

## 2023-02-01 LAB — CUP PACEART REMOTE DEVICE CHECK
Date Time Interrogation Session: 20240828230845
Implantable Pulse Generator Implant Date: 20210824

## 2023-02-02 NOTE — Progress Notes (Signed)
@Patient  ID: [name], female    DOB: 05/15/1933, 89 y.o.   MRN: [id number]  Chief Complaint  Patient presents with   Follow-up    Pt had cxr 11/03/22    Referring provider: Verl Blalock  HPI:   89 y.o. woman with history of bronchiectasis from you are seeing for evaluation of the same.  Multiple pulmonary notes reviewed.  Likely PND.  Baseline dyspnea.  Gradually worse over time.  No new maintenance inhaler.  Main complaint is sinus congestion.  Ear pain, discomfort etc.  Reviewed serial chest CTs that show mild worsening of nodular opacities with scattered significant bronchiectasis particularly in the lower and middle lobes.  We discussed at length strategies to help treat this including dyspnea etc.  Reviewed PFTs show obstruction in 2016.  Questionaires / Pulmonary Flowsheets:   ACT:      No data to display          MMRC:     No data to display          Epworth:      No data to display          Tests:   FENO:  No results found for: "NITRICOXIDE"  PFT:    Latest Ref Rng & Units 05/24/2015    3:42 PM  PFT Results  FVC-Pre L 2.21   FVC-Predicted Pre % 85   FVC-Post L 2.25   FVC-Predicted Post % 87   Pre FEV1/FVC % % 66   Post FEV1/FCV % % 69   FEV1-Pre L 1.46   FEV1-Predicted Pre % 76   FEV1-Post L 1.55   DLCO uncorrected ml/min/mmHg 15.11   DLCO [facility]% % 62   DLVA Predicted % 88   TLC L 4.60   TLC % Predicted % 91   RV % Predicted % 98   Personally viewed and interpreted as mild fixed obstruction, no significant bronchodilator spots, lung volumes are normal limits, DLCO moderately reduced  WALK:      No data to display          Imaging: Personally reviewed and as per EMR discussion this note CUP PACEART REMOTE DEVICE CHECK  Result Date: 02/01/2023 ILR summary report received. Battery status OK. Normal device function. No new tachy, brady, or pause episodes. 1 new AF episode lasting 5 hours, AF burden 0.6%.  2 new  symptom episodes, EGMs consistent with AF.  All episodes occurred on 01/24/23, symptom  episodes appear correlated with AF episode.  Symptomatic AF noted here was previously reported to clinic, patient declined AF clinic appointment and self-stopped OAC per Epic. Monthly summary reports and ROV/PRN - CS, CVRS  CUP PACEART INCLINIC DEVICE CHECK  Result Date: 01/20/2023 Loop check in clinic. Battery status: 600d__. R-waves _0.40_mV. see office note for ILR findings/discussion   Lab Results: Personally reviewed CBC    Component Value Date/Time   WBC 11.0 (H) 01/19/2023 1615   WBC 9.8 07/02/2022 0600   RBC 4.69 01/19/2023 1615   RBC 3.89 07/02/2022 0600   HGB 14.3 01/19/2023 1615   HCT 42.6 01/19/2023 1615   PLT 319 01/19/2023 1615   MCV 91 01/19/2023 1615   MCH 30.5 01/19/2023 1615   MCH 31.1 07/02/2022 0600   MCHC 33.6 01/19/2023 1615   MCHC 33.2 07/02/2022 0600   RDW 13.1 01/19/2023 1615   LYMPHSABS 0.7 07/01/2022 1439   MONOABS 1.0 07/01/2022 1439   EOSABS 0.0 07/01/2022 1439   BASOSABS 0.1 07/01/2022 1439  BMET    Component Value Date/Time   NA 138 01/19/2023 1615   K 4.6 01/19/2023 1615   CL 101 01/19/2023 1615   CO2 25 01/19/2023 1615   GLUCOSE 79 01/19/2023 1615   GLUCOSE 99 07/02/2022 0600   BUN 14 01/19/2023 1615   CREATININE 0.87 01/19/2023 1615   CREATININE 0.84 05/11/2015 1009   CALCIUM 9.6 01/19/2023 1615   GFRNONAA 38 (L) 07/02/2022 0600   GFRAA 52 (L) 12/12/2019 1313    BNP No results found for: "BNP"  ProBNP No results found for: "PROBNP"  Specialty Problems       Pulmonary Problems   Obstructive bronchiectasis (HCC)     Followed in Pulmonary clinic/ [name]/ [facility]  Onset ? Age 37 wit bronchiectasis first confirmed 2007  - Alpha one Screen  August 18, 2010 = MM  - See CT Chest 09/29/05 Stable bronchiectasis in right middle lobe and lingula.  Scattered tree in bud opacities throughout the lungs, right greater than left,  nonspecific.    - PFT's 06/21/2012 1.67 (91%) ratio 64 and no change,  DLCO 77% - Flutter valve added 04/27/2014  - PFTs 05/24/15   FEV1 1.55 (81%) ratio 69 p 6% improvement from saba and dlco 62% ratio 88%  - HRCT 04/04/16 >>> 1. 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 2. An incidental finding of potential clinical significance has been found. Dominant nodule in the right upper lobe, likely infectious/inflammatory in etiology.  - Zmax 250 mg daily 03/01/16 - 05/01/16 improved cough at d/c but diarrhea while on it and no change in nodule RUL > d/c 07/07/16 - 08/28/2017 added back cycles of zpak prn flare of purulent bronchitis > d/c 05/20/2018  - levaquin 500 x 10 days prn 06/11/2018 - flutter valve training 06/24/18 Chest CT 09/09/2018 Increased tree-in-bud nodularity in right lower lobe since 2016 -10/11/2020  new L lung mid nodule noted and likely inflammatory  - 01/17/2021    Am sputum samples>>>not done as of 08/01/2021 and cxr worse so rec BAL but get HRCT 1st  - 08/09/21  HRCT minimal change since 2017 so no BAL indicated  - 01/10/2022 reported cough worse p supper > resume gerd rx and do  MBS  (see uacs)            Bronchitis, chronic (HCC)   Upper airway cough syndrome    Trial of max gerd rx 11/24/2015 >> worse off rx 03/29/2016 and 08/28/2017 > resume ppi qam ac> resolved as of 11/30/2017   - Flared again 04/2018 > rechallenged 06/11/2018 and did not follow instructions> resolved 07/22/2018  - worse again 09/19/2019 rec increase pepcid to 20 mg bid and diet/bed blocks, f/u ent prn ([facility]) - MBS  02/21/22  see dyshagia                                       Acute bronchitis    Allergies  Allergen Reactions   Hydralazine Hcl Shortness Of Breath    With throat numbness/tingling and swelling   Ciprofloxacin Other (See Comments)    triggered a  migraine that was thought to be a TIA   Penicillins Rash    Has patient had a PCN reaction causing immediate rash, facial/tongue/throat swelling, SOB or lightheadedness with hypotension: Yes Has patient had a PCN reaction causing  severe rash involving mucus membranes or skin necrosis: No Has patient had a PCN reaction that required hospitalization: No Has patient had a PCN reaction occurring within the last 10 years: No If all of the above answers are "NO", then may proceed with Cephalosporin use.   Sulfonamide Derivatives Swelling   Apixaban Other (See Comments)    weakness   Avelox [Moxifloxacin] Other (See Comments)    Rash and severe tingling/numbness    Band-Aid Infection Defense [Bacitracin-Polymyxin B] Other (See Comments)    unknown   Dome-Paste Bandage [Wound Dressings] Hives   Mannitol     Unknown reaction   Multaq [Dronedarone]     Muscle aches   Nitrofurantoin Monohyd Macro      due to lung disease   Other Other (See Comments)    Water for injection, sterile - unknown   Reclast [Zoledronic Acid] Other (See Comments)    hypertension   Sulfa Antibiotics Other (See Comments)    unknown   Chlorpheniramine Other (See Comments)    Headache, stomach ache, bladder "not working well"   Prednisone Rash    Immunization History  Administered Date(s) Administered   Influenza Split 05/20/2007, 04/27/2010, 03/01/2011, 03/08/2012, 03/24/2014, 06/15/2016   Influenza Whole 04/28/2009   Influenza, Seasonal, Injecte, Preservative Fre 04/05/2014, 04/06/2015   PFIZER(Purple Top)SARS-COV-2 Vaccination 08/22/2019, 09/17/2019   Pneumococcal Conjugate-13 04/27/2014   Pneumococcal Polysaccharide-23 01/22/2001, 04/28/2009, 06/05/2010, 04/05/2014   Tetanus 06/06/2003   Tetanus Immune Globulin 06/05/2008    Past Medical History:  Diagnosis Date   Arm numbness left    Back pain    BCC (basal cell carcinoma of skin) 03/16/1986   Left Shoulder (curet and excision)   BCC (basal cell  carcinoma of skin) 03/10/1988   Mid Back (tx p bx)   BCC (basal cell carcinoma of skin) 01/28/1998   Upper Lip (curet and excision)   BCC (basal cell carcinoma of skin) 10/24/2005   Right Mid Back (curet and 5FU)   Bronchitis, chronic (HCC)    Depression    Dizziness    Fatigue    Hypertension    mild   Obstructive bronchiectasis (HCC) 08/28/2007    Followed in Pulmonary clinic/ [facility]/ [facility]  Onset ? Age 72 wit bronchiectasis first confirmed 2007  - Alpha one Screen  August 18, 2010 = MM  - See CT Chest 09/29/05 Stable bronchiectasis in right middle lobe and lingula.  Scattered tree in bud opacities throughout the lungs, right greater than left,  nonspecific.   - PFT's 06/21/2012 1.67 (91%) ratio 64 and no change,  DLCO 77% - Flutte   Palpitations    SCCA (squamous cell carcinoma) of skin 05/08/2011   Left Hand (in situ) (tx p bx)   Skin cancer    Stress    Superficial basal cell carcinoma (BCC) 07/11/1995   Top Right Upper Leg (curet and 5FU)   Superficial basal cell carcinoma (BCC) 01/28/1998   Right Back, Upper Thigh (curet and cautery)   Superficial basal cell carcinoma (BCC) 02/18/2002   Upper Inner Right Leg (curet and cautery)   Superficial basal cell carcinoma (BCC) 02/18/2002   Lower Abdomen (curet and 5FU)   Superficial basal cell carcinoma (BCC) 01/27/2013   Right Inner Thigh (tx p bx)   Syncope    History of     Tobacco History: Social History   Tobacco Use  Smoking Status Never  Smokeless Tobacco Never   Counseling given: Not Answered   Continue to not smoke  Outpatient  Encounter Medications as of 01/02/2023  Medication Sig   acetaminophen (TYLENOL) 500 MG tablet Take 500 mg by mouth every 6 (six) hours as needed.   diltiazem (CARDIZEM) 30 MG tablet Take 1 tablet (30 mg total) by mouth 4 (four) times daily as needed (for elevated heart rates).   HYDROcodone-acetaminophen (NORCO/VICODIN) 5-325 MG tablet Take 1 tablet by mouth 2 (two) times daily as  needed for severe pain.   Misc. Devices MISC by Does not apply route. Flutter valve when needed for cough   Polyvinyl Alcohol-Povidone (REFRESH OP) Place 1 drop into both eyes daily as needed (dry eyes).   [DISCONTINUED] metoprolol tartrate (LOPRESSOR) 25 MG tablet Take 1 tablet (25 mg total) by mouth 2 (two) times daily.   [DISCONTINUED] levofloxacin (LEVAQUIN) 500 MG tablet Take 1 tablet (500 mg total) by mouth daily. (Patient not taking: Reported on 01/02/2023)   [DISCONTINUED] tiZANidine (ZANAFLEX) 2 MG tablet Take 2 mg by mouth at bedtime. (Patient not taking: Reported on 01/02/2023)   No facility-administered encounter medications on file as of 01/02/2023.     Review of Systems  Review of Systems  No chest pain exertion.  No orthopnea or PND.  Comprehensive review of systems otherwise negative. Physical Exam  BP 122/68   Pulse 86   Ht 5\' 4"  (1.626 m)   Wt 123 lb 9.6 oz (56.1 kg)   SpO2 96%   BMI 21.22 kg/m   Wt Readings from Last 5 Encounters:  01/19/23 123 lb (55.8 kg)  01/02/23 123 lb 9.6 oz (56.1 kg)  11/03/22 126 lb 9.6 oz (57.4 kg)  10/18/22 120 lb (54.4 kg)  08/31/22 126 lb (57.2 kg)    BMI Readings from Last 5 Encounters:  01/19/23 21.11 kg/m  01/02/23 21.22 kg/m  11/03/22 21.73 kg/m  10/18/22 20.60 kg/m  08/31/22 21.63 kg/m     Physical Exam General: Sitting in chair, no acute distress Eyes: EOMI, icterus Neck: Supple, no JVP Pulmonary: Clear, no work of breathing Abdomen: Nondistended bowel sounds present Cardiovascular: Warm, no edema MSK: No synovitis, no joint effusion Neuro: Normal gait, no weakness Psych: Normal mood, full affect   Assessment & Plan:   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  Mildly worsened over time.  Treatment of NTM disease.  Discussed at length importance of airway to hopefully minimize sputum buildup and worsening disease.  AFB and sputum cultures ordered today, cannot  provide samples, cups to collect sample sent home.  Sinus congestion: Continue Mucinex, add Afrin 2 sprays his nostril twice a day, encouraged to add on over-the-counter antihistamine if symptoms continue  DOE: Likely of bronchiectasis, had mild fixed obstruction in 2016.  Likely main contributor.  Trial Stiolto 2 puffs once daily.  Return in about 3 months (around 04/04/2023).   Karren Burly, MD 02/02/2023   This appointment required 41 minutes of patient care (this includes precharting, chart review, review of results, face-to-face care, etc.).

## 2023-02-06 ENCOUNTER — Ambulatory Visit (INDEPENDENT_AMBULATORY_CARE_PROVIDER_SITE_OTHER): Payer: Medicare Other

## 2023-02-06 DIAGNOSIS — R55 Syncope and collapse: Secondary | ICD-10-CM

## 2023-02-06 DIAGNOSIS — H26492 Other secondary cataract, left eye: Secondary | ICD-10-CM | POA: Diagnosis not present

## 2023-02-09 ENCOUNTER — Ambulatory Visit (HOSPITAL_COMMUNITY): Payer: Medicare Other | Attending: Cardiovascular Disease

## 2023-02-09 DIAGNOSIS — R0602 Shortness of breath: Secondary | ICD-10-CM | POA: Insufficient documentation

## 2023-02-09 LAB — ECHOCARDIOGRAM COMPLETE
Area-P 1/2: 3.7 cm2
P 1/2 time: 396 ms
S' Lateral: 2.3 cm

## 2023-02-19 NOTE — Progress Notes (Signed)
Carelink Summary Report / Loop Recorder 

## 2023-03-05 DIAGNOSIS — L821 Other seborrheic keratosis: Secondary | ICD-10-CM | POA: Diagnosis not present

## 2023-03-05 DIAGNOSIS — B078 Other viral warts: Secondary | ICD-10-CM | POA: Diagnosis not present

## 2023-03-05 DIAGNOSIS — D485 Neoplasm of uncertain behavior of skin: Secondary | ICD-10-CM | POA: Diagnosis not present

## 2023-03-05 DIAGNOSIS — C44519 Basal cell carcinoma of skin of other part of trunk: Secondary | ICD-10-CM | POA: Diagnosis not present

## 2023-03-05 DIAGNOSIS — L814 Other melanin hyperpigmentation: Secondary | ICD-10-CM | POA: Diagnosis not present

## 2023-03-05 DIAGNOSIS — D225 Melanocytic nevi of trunk: Secondary | ICD-10-CM | POA: Diagnosis not present

## 2023-03-07 LAB — CUP PACEART REMOTE DEVICE CHECK
Date Time Interrogation Session: 20240930231557
Implantable Pulse Generator Implant Date: 20210824

## 2023-03-08 DIAGNOSIS — M542 Cervicalgia: Secondary | ICD-10-CM | POA: Diagnosis not present

## 2023-03-08 DIAGNOSIS — I10 Essential (primary) hypertension: Secondary | ICD-10-CM | POA: Diagnosis not present

## 2023-03-08 DIAGNOSIS — R42 Dizziness and giddiness: Secondary | ICD-10-CM | POA: Diagnosis not present

## 2023-03-08 DIAGNOSIS — G629 Polyneuropathy, unspecified: Secondary | ICD-10-CM | POA: Diagnosis not present

## 2023-03-08 DIAGNOSIS — Z8673 Personal history of transient ischemic attack (TIA), and cerebral infarction without residual deficits: Secondary | ICD-10-CM | POA: Diagnosis not present

## 2023-03-08 DIAGNOSIS — G894 Chronic pain syndrome: Secondary | ICD-10-CM | POA: Diagnosis not present

## 2023-03-12 ENCOUNTER — Ambulatory Visit: Payer: Medicare Other

## 2023-03-12 DIAGNOSIS — R55 Syncope and collapse: Secondary | ICD-10-CM

## 2023-03-13 ENCOUNTER — Ambulatory Visit: Payer: Medicare Other | Admitting: Podiatry

## 2023-03-23 DIAGNOSIS — N393 Stress incontinence (female) (male): Secondary | ICD-10-CM | POA: Diagnosis not present

## 2023-03-23 DIAGNOSIS — M810 Age-related osteoporosis without current pathological fracture: Secondary | ICD-10-CM | POA: Diagnosis not present

## 2023-03-23 DIAGNOSIS — Z01411 Encounter for gynecological examination (general) (routine) with abnormal findings: Secondary | ICD-10-CM | POA: Diagnosis not present

## 2023-03-23 DIAGNOSIS — Z124 Encounter for screening for malignant neoplasm of cervix: Secondary | ICD-10-CM | POA: Diagnosis not present

## 2023-03-23 DIAGNOSIS — Z1231 Encounter for screening mammogram for malignant neoplasm of breast: Secondary | ICD-10-CM | POA: Diagnosis not present

## 2023-03-23 DIAGNOSIS — Z01419 Encounter for gynecological examination (general) (routine) without abnormal findings: Secondary | ICD-10-CM | POA: Diagnosis not present

## 2023-03-29 NOTE — Progress Notes (Signed)
Carelink Summary Report / Loop Recorder 

## 2023-04-05 ENCOUNTER — Ambulatory Visit (INDEPENDENT_AMBULATORY_CARE_PROVIDER_SITE_OTHER): Payer: Medicare Other | Admitting: Internal Medicine

## 2023-04-05 ENCOUNTER — Ambulatory Visit: Payer: Medicare Other

## 2023-04-05 ENCOUNTER — Encounter: Payer: Self-pay | Admitting: Internal Medicine

## 2023-04-05 VITALS — BP 128/74 | HR 85 | Temp 98.3°F | Ht 64.0 in | Wt 124.6 lb

## 2023-04-05 DIAGNOSIS — J8410 Pulmonary fibrosis, unspecified: Secondary | ICD-10-CM | POA: Diagnosis not present

## 2023-04-05 DIAGNOSIS — J479 Bronchiectasis, uncomplicated: Secondary | ICD-10-CM

## 2023-04-05 DIAGNOSIS — R059 Cough, unspecified: Secondary | ICD-10-CM | POA: Diagnosis not present

## 2023-04-05 DIAGNOSIS — J449 Chronic obstructive pulmonary disease, unspecified: Secondary | ICD-10-CM | POA: Diagnosis not present

## 2023-04-05 NOTE — Patient Instructions (Signed)
I strongly recommend you follow Dr [facility] recommendations or let him send you to [facility]  Please remember to go to the  x-ray department  for your tests - we will call you with the results when they are available    Follow up in this clinic is as needed

## 2023-04-06 ENCOUNTER — Telehealth: Payer: Self-pay | Admitting: Internal Medicine

## 2023-04-06 NOTE — Telephone Encounter (Signed)
Patient seen 04/05/23. States she left confused and has further questions. States she did not want to go to [facility] but not that she was refusing it. She thought mucinex D was a prescription and not over the counter. She wants to know why she doesnt need to continue followup with Dr Sherene Sires. She is very confused about everything.

## 2023-04-07 NOTE — Assessment & Plan Note (Addendum)
Onset ? Age 89 wit bronchiectasis first confirmed 2007  - Alpha one Screen  August 18, 2010 = MM  - See CT Chest 09/29/05 Stable bronchiectasis in right middle lobe and lingula.  Scattered tree in bud opacities throughout the lungs, right greater than left,  nonspecific.   - PFT's 06/21/2012 1.67 (91%) ratio 64 and no change,  DLCO 77% - Flutter valve added 04/27/2014  - PFTs 05/24/15   FEV1 1.55 (81%) ratio 69 p 6% improvement from saba and dlco 62% ratio 88%  - HRCT 04/04/16 >>> 1. 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 2. An incidental finding of potential clinical significance has been found. Dominant nodule in the right upper lobe, likely infectious/inflammatory in etiology.  - Zmax 250 mg daily 03/01/16 - 05/01/16 improved cough at d/c but diarrhea while on it and no change in nodule RUL > d/c 07/07/16 - 08/28/2017 added back cycles of zpak prn flare of purulent bronchitis > d/c 05/20/2018  - levaquin 500 x 10 days prn 06/11/2018 - flutter valve training 06/24/18 Chest CT 09/09/2018 Increased tree-in-bud nodularity in right lower lobe since 2016 -10/11/2020  new L lung mid nodule noted and likely inflammatory  - 01/17/2021    Am sputum samples>>>not done as of 08/01/2021 and cxr worse so rec BAL but get HRCT 1st  - 08/09/21  HRCT minimal change since 2017 so no BAL indicated  - 01/10/2022 reported cough worse p supper > resume gerd rx and do  MBS  (see uacs)   Nothing else to offer here in this clinic, informed she either needs to f/u with Dr Judeth Horn or let me refer her to [facility]/ Dr Rosann Auerbach clinic.  The good news is that her dz is relatively stable with really no radiographic or clinical progression but the next step for decline is either collect am sputa as rec by Dr Rexene Edison or FOB with BAL to pinpoint the problem and consider ID input again.          Each  maintenance medication was reviewed in detail including emphasizing most importantly the difference between maintenance and prns and under what circumstances the prns are to be triggered using an action plan format where appropriate.  Total time for H and P, chart review, counseling, reviewing flutter  device(s) and generating customized AVS unique to this office visit / same day charting = 20 min

## 2023-04-09 ENCOUNTER — Ambulatory Visit: Payer: Medicare Other | Admitting: Pulmonary Disease

## 2023-04-09 NOTE — Telephone Encounter (Signed)
Lm x1 for patient.  

## 2023-04-10 NOTE — Telephone Encounter (Signed)
Pt returning call concerning follow up

## 2023-04-11 NOTE — Telephone Encounter (Signed)
I spoke with the patient. She said at her last visit with you, you told she needed to go [facility]. She is wondering what she needs to go to [facility] for? She has never been there but if you think she needs to go she is willing.  She said she has been seeing you for years.

## 2023-04-11 NOTE — Telephone Encounter (Signed)
PT ret our cb [facility]) and is anxious for someone to call her back. States no one called her. I told her I was sure our nurse did not falsify the record here and that we'd try again.

## 2023-04-11 NOTE — Telephone Encounter (Signed)
I've run out of options for her next flare so my rec was to return to [facility] or let me refer her to Dr Reuel Boom at [facility]

## 2023-04-12 ENCOUNTER — Ambulatory Visit: Payer: Medicare Other | Admitting: Podiatry

## 2023-04-12 ENCOUNTER — Encounter: Payer: Self-pay | Admitting: Internal Medicine

## 2023-04-12 NOTE — Telephone Encounter (Signed)
Spoke to patient and relayed below message/recommendations. She would like to see Dr. Judeth Horn prior to being referred to [facility].  Appt scheduled 06/25/23 at 2:30. Nothing further needed.

## 2023-04-13 ENCOUNTER — Telehealth: Payer: Self-pay | Admitting: Podiatry

## 2023-04-13 NOTE — Telephone Encounter (Signed)
Before sending the message I did offer pt several appts but she had other appts on the days he had availability. I also offered her to see Dr Allena Katz on a Friday 11/15 1215pm as that would be better for her and she cannot come that early.

## 2023-04-13 NOTE — Telephone Encounter (Signed)
Pt called upset that she has not gotten to see you she has been r/s 2 times. She is just needing information on what she needs to do for her nails. She got the report that was from August but is not sure what she should be doing. Pt would like a call to discuss further please.

## 2023-04-15 NOTE — Progress Notes (Unsigned)
Cardiology Office Note Date:  04/15/2023  Patient ID:  [name], [name] 03/17/1933, MRN [id number] PCP:  Sheliah Hatch, PA-C  Electrophysiologist: Dr. Elberta Fortis    Chief Complaint:  *** 3 mo  History of Present Illness: [name] is a 88 y.o. female with history of HTN, PAFib/SVT, chronic bronchitis  Admitted 07/01/22 after a syncopal event, had not eaten much that day, had soe elevated HRs and took her diltiazem, started to feel dizzy, ultimately had a syncopal event in the [place] interrogated, noted that she had been having multiple episodes of SVTs though none of late, started on Flecainide. Unclear cause of her syncope, though treated for diverticultis Discharged 07/02/22  I saw her 08/01/22 She is feeling fairly terrible She has self stopped cost of her medicines, including the flecainide and xarelto. She reports the antibiotics made her feel awful, terrible GI side effects, though did complete them She reports she has had near syncope in the past and both times associated with times of infection. She is now on Cipro for a UTI.  The antibiotic making all of her joints ache.  This is how she feels from Cipro having been on it before, but told that it was the antibiotic needed to clear her infection so she is "struggling through it". She really does not want to consider another medication. No CP, no palpitations or cardiac awareness, no SOB No near syncope or syncope. She had no bleeding trouble and no particular side effects with Xarelto, but stopped it because she was feeling so bad overall. She doesn't think she has had any AFib, generally can tell and feels like the PRN diltiazem works well in settling it  We discussed at length rational for [facility], she remained unagreeable to resume. Advised diltiazem for some RVR she also was unagreeable to this. Planned to see her once she was off antibiotics, hopefully feeling better with resolution of UTI and revisit meds  I  saw her 11/23/22 She never really feels well, but does feel better, very much attributes that to being off the medications and completing the antibiotics She does not think she had had any Afib No CP, palpitations No near syncope or syncope. Chronic cough with her bronchiectasis Remained off AAD and OAC, did not want to resume Noted to have some PVCs with her ILR interrogation and PVC counter turned on to assess burden  I saw her 01/26/23 She is alone today Still just not feeling well. Goes back to January, reminisces on how terrible she felt, and how the antibiotics nearly killed her, and she has just never felt well again. No CP, palpitations. She is tired all of the time She is not taking metoprolol on her list, never started it Does have PRN dilt but rarely feels the need to take it She feels a constant sense of low grade dizziness, no energy. 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 No near syncope or syncope. She requests labs today to see if that may explain her ongoing fatigue Planned for echo Advised f/u with her PMD  Echo labs looked good, nothing to explain her fatigue  *** symptoms *** AF burden *** PVCs? *** doesn't want meds/OAC  Device information MDT LINQ II, implanted 01/27/2020   Past Medical History:  Diagnosis Date   Arm numbness left    Back pain    BCC (basal cell carcinoma of skin) 03/16/1986   Left Shoulder (curet and excision)   BCC (basal cell carcinoma  of skin) 03/10/1988   Mid Back (tx p bx)   BCC (basal cell carcinoma of skin) 01/28/1998   Upper Lip (curet and excision)   BCC (basal cell carcinoma of skin) 10/24/2005   Right Mid Back (curet and 5FU)   Bronchitis, chronic (HCC)    Depression    Dizziness    Fatigue    Hypertension    mild   Obstructive bronchiectasis (HCC) 08/28/2007    Followed in Pulmonary clinic/ [facility]/ [place]  Onset ? Age 68 wit bronchiectasis first confirmed 2007  - Alpha one Screen  August 18, 2010 = MM  - See CT Chest 09/29/05 Stable bronchiectasis in right middle lobe and lingula.  Scattered tree in bud opacities throughout the lungs, right greater than left,  nonspecific.   - PFT's 06/21/2012 1.67 (91%) ratio 64 and no change,  DLCO 77% - Fl[place]te   Palpitations    SCCA (squamous cell carcinoma) of skin 05/08/2011   Left Hand (in situ) (tx p bx)   Skin cancer    Stress    Superficial basal cell carcinoma (BCC) 07/11/1995   Top Right Upper Leg (curet and 5FU)   Superficial basal cell carcinoma (BCC) 01/28/1998   Right Back, Upper Thigh (curet and cautery)   Superficial basal cell carcinoma (BCC) 02/18/2002   Upper Inner Right Leg (curet and cautery)   Superficial basal cell carcinoma (BCC) 02/18/2002   Lower Abdomen (curet and 5FU)   Superficial basal cell carcinoma (BCC) 01/27/2013   Right Inner Thigh (tx p bx)   Syncope    History of     Past Surgical History:  Procedure Laterality Date   APPENDECTOMY     BIOPSY  10/18/2022   Procedure: BIOPSY;  Surgeon: Charlott Rakes, MD;  Location: [facility];  Service: Gastroenterology;;   CESAREAN SECTION  [phone]   COLONOSCOPY WITH PROPOFOL N/A 10/18/2022   Procedure: COLONOSCOPY WITH PROPOFOL;  Surgeon: Charlott Rakes, MD;  Location: [facility];  Service: Gastroenterology;  Laterality: N/A;   POLYPECTOMY  10/18/2022   Procedure: POLYPECTOMY;  Surgeon: Charlott Rakes, MD;  Location: [facility];  Service: Gastroenterology;;    Current Outpatient Medications  Medication Sig Dispense Refill   acetaminophen (TYLENOL) 500 MG tablet Take 500 mg by mouth every 6 (six) hours as needed.     HYDROcodone-acetaminophen (NORCO/VICODIN) 5-325 MG tablet Take 1 tablet by mouth 2 (two) times daily as needed for severe pain.     Misc. Devices MISC by Does not apply route. Flutter valve when needed for cough     No current facility-administered medications for this visit.    Allergies:   Hydralazine hcl, Ciprofloxacin,  Penicillins, Sulfonamide derivatives, Apixaban, Avelox [moxifloxacin], Band-aid infection defense [bacitracin-polymyxin b], Dome-paste bandage [wound dressings], Mannitol, Multaq [dronedarone], Nitrofurantoin monohyd macro, Other, Reclast [zoledronic acid], Sulfa antibiotics, Chlorpheniramine, and Prednisone   Social History:  The patient  reports that she has never smoked. She has never used smokeless tobacco. She reports current alcohol use. She reports that she does not use drugs.   Family History:  The patient's family history includes Alcohol abuse in her father; Atrial fibrillation in her mother; Thyroid disease in her mother.  ROS:  Please see the history of present illness.    All other systems are reviewed and otherwise negative.   PHYSICAL EXAM:  VS:  There were no vitals taken for this visit. BMI: There is no height or weight on file to calculate BMI. Well nourished, thin body habitus, extremely well dressed,  well developed, in no acute distress HEENT: normocephalic, atraumatic Neck: no JVD, carotid bruits or masses Cardiac:  *** RRR; no significant murmurs, no rubs, or gallops Lungs: *** CTA b/l, no wheezing, rhonchi or rales Abd: soft, nontender MS: no deformity, age appropriate atrophy Ext: *** no edema Skin: warm and dry, no rash Neuro:  No gross deficits appreciated Psych: euthymic mood, full affect   EKG: not done today  Device interrogation done today and reviewed by myself  *** Battery is good ***  02/09/23: TTE 1. Left ventricular ejection fraction, by estimation, is 60 to 65%. The  left ventricle has normal function. The left ventricle has no regional  wall motion abnormalities. Left ventricular diastolic parameters are  consistent with Grade I diastolic  dysfunction (impaired relaxation).   2. Right ventricular systolic function is normal. The right ventricular  size is normal. There is normal pulmonary artery systolic pressure. The  estimated right  ventricular systolic pressure is 27.0 mmHg.   3. Right atrial size was mildly dilated.   4. The mitral valve is normal in structure. Trivial mitral valve  regurgitation. No evidence of mitral stenosis.   5. The aortic valve is normal in structure. Aortic valve regurgitation is  moderate. Aortic valve sclerosis/calcification is present, without any  evidence of aortic stenosis. Aortic regurgitation PHT measures 396 msec.   6. The inferior vena cava is normal in size with greater than 50%  respiratory variability, suggesting right atrial pressure of 3 mmHg.     ECHO: 10/30/2021  1. Left ventricular ejection fraction, by estimation, is 60 to 65%. The  left ventricle has normal function. The left ventricle has no regional  wall motion abnormalities. Left ventricular diastolic parameters were  normal.   2. Right ventricular systolic function is normal. The right ventricular  size is normal. There is normal pulmonary artery systolic pressure.   3. The pericardial effusion is anterior to the right ventricle.   4. The mitral valve is normal in structure. No evidence of mitral valve  regurgitation. No evidence of mitral stenosis.   5. The aortic valve is tricuspid. There is mild calcification of the  aortic valve. Aortic valve regurgitation is mild. Aortic valve sclerosis  is present, with no evidence of aortic valve stenosis.   6. The inferior vena cava is dilated in size with >50% respiratory  variability, suggesting right atrial pressure of 8 mmHg.   Recent Labs: 07/01/2022: ALT 14 07/02/2022: Magnesium 2.0 01/19/2023: BUN 14; Creatinine, Ser 0.87; Hemoglobin 14.3; Platelets 319; Potassium 4.6; Sodium 138  No results found for requested labs within last 365 days.   CrCl cannot be calculated (Patient's most recent lab result is older than the maximum 21 days allowed.).   Wt Readings from Last 3 Encounters:  04/05/23 124 lb 9.6 oz (56.5 kg)  01/19/23 123 lb (55.8 kg)  01/02/23 123 lb 9.6  oz (56.1 kg)     Other studies reviewed: Additional studies/records reviewed today include: summarized above  ASSESSMENT AND PLAN:  Paroxysmal Afib CHA2DS2Vasc is 4, self stopped xarelto Self stopped flecainide *** %burden   We have had a long discussions in the past on rational for [facility] and her risk. She does not want to resume meds Understands stroke risk  HTN ***   3. PVC No symptoms Low burden Doubt source for her fatigue  4. Fatigue ***  5. Secondary hypercoagulable state   Disposition: ** will have her back in 3 mo again, sooner if needed  Current medicines are reviewed at length with the patient today.  The patient did not have any concerns regarding medicines.  Norma Fredrickson, PA-C 04/15/2023 12:32 PM     [facility] [address] [place] [address] [phone] (office)  [phone] (fax)

## 2023-04-17 ENCOUNTER — Ambulatory Visit: Payer: Medicare Other | Attending: Physician Assistant | Admitting: Physician Assistant

## 2023-04-17 ENCOUNTER — Encounter: Payer: Self-pay | Admitting: Physician Assistant

## 2023-04-17 VITALS — BP 176/88 | HR 73 | Ht 64.0 in | Wt 123.6 lb

## 2023-04-17 DIAGNOSIS — I480 Paroxysmal atrial fibrillation: Secondary | ICD-10-CM

## 2023-04-17 DIAGNOSIS — Z9889 Other specified postprocedural states: Secondary | ICD-10-CM

## 2023-04-17 DIAGNOSIS — I10 Essential (primary) hypertension: Secondary | ICD-10-CM

## 2023-04-17 DIAGNOSIS — I493 Ventricular premature depolarization: Secondary | ICD-10-CM | POA: Diagnosis not present

## 2023-04-17 NOTE — Patient Instructions (Signed)
Medication Instructions:  Your physician recommends that you continue on your current medications as directed. Please refer to the Current Medication list given to you today.  *If you need a refill on your cardiac medications before your next appointment, please call your pharmacy*   Lab Work: NONE If you have labs (blood work) drawn today and your tests are completely normal, you will receive your results only by: MyChart Message (if you have MyChart) OR A paper copy in the mail If you have any lab test that is abnormal or we need to change your treatment, we will call you to review the results.   Testing/Procedures: NONE   Follow-Up: At [facility], you and your health needs are our priority.  As part of our continuing mission to provide you with exceptional heart care, we have created designated Provider Care Teams.  These Care Teams include your primary Cardiologist (physician) and Advanced Practice Providers (APPs -  Physician Assistants and Nurse Practitioners) who all work together to provide you with the care you need, when you need it.  We recommend signing up for the patient portal called "MyChart".  Sign up information is provided on this After Visit Summary.  MyChart is used to connect with patients for Virtual Visits (Telemedicine).  Patients are able to view lab/test results, encounter notes, upcoming appointments, etc.  Non-urgent messages can be sent to your provider as well.   To learn more about what you can do with MyChart, go to [email or web address].    Your next appointment:   3 month(s)  Provider:   Francis Dowse, PA-C

## 2023-04-19 DIAGNOSIS — G894 Chronic pain syndrome: Secondary | ICD-10-CM | POA: Diagnosis not present

## 2023-04-19 DIAGNOSIS — I10 Essential (primary) hypertension: Secondary | ICD-10-CM | POA: Diagnosis not present

## 2023-04-24 DIAGNOSIS — C44519 Basal cell carcinoma of skin of other part of trunk: Secondary | ICD-10-CM | POA: Diagnosis not present

## 2023-04-26 ENCOUNTER — Ambulatory Visit: Payer: Medicare Other | Admitting: Podiatry

## 2023-04-26 DIAGNOSIS — L603 Nail dystrophy: Secondary | ICD-10-CM | POA: Diagnosis not present

## 2023-04-26 DIAGNOSIS — M85852 Other specified disorders of bone density and structure, left thigh: Secondary | ICD-10-CM | POA: Diagnosis not present

## 2023-04-26 DIAGNOSIS — M8588 Other specified disorders of bone density and structure, other site: Secondary | ICD-10-CM | POA: Diagnosis not present

## 2023-04-26 DIAGNOSIS — M8589 Other specified disorders of bone density and structure, multiple sites: Secondary | ICD-10-CM | POA: Diagnosis not present

## 2023-04-26 MED ORDER — TERBINAFINE HCL 250 MG PO TABS: 250.0000 mg | ORAL_TABLET | Freq: Every day | ORAL | 0 refills | Status: DC

## 2023-04-26 NOTE — Progress Notes (Signed)
She presents today for follow-up of her nail pathology.  Objective: Vitals are stable alert oriented x 3 pulses are unchanged unchanged physical exam.  Pathology does demonstrate saprophytic fungus and dermatophytic fungus.  Assessment: Onychomycosis.  Plan: At this point we discussed topical therapy laser therapy and oral therapy.  She like to try oral therapy.  I recommended that we start with terbinafine.  We will take this 250 mg tablet daily and she will do this for 30 days we did discuss pros and cons of this medication possible side effects and complications associated with this she understands and is amenable to it I will follow-up with her in 30 days for blood work.

## 2023-04-30 NOTE — Telephone Encounter (Signed)
Provider spoke to patient in regards to results.

## 2023-05-01 ENCOUNTER — Telehealth: Payer: Self-pay | Admitting: Internal Medicine

## 2023-05-01 NOTE — Telephone Encounter (Addendum)
Got her Ct results but she doesn't know hwo she need to follow up with Dr. Judeth Horn or Dr. Vonita Moss w/ Dr. Sherene Sires he mentioned pt going to [facility] and didn't hear anything about that from Dr. Judeth Horn

## 2023-05-01 NOTE — Telephone Encounter (Signed)
Per Dr. Sherene Sires last note patient preferred to follow up with me before considering going to [facility]. There is a telephone encounter 04/12/2023 with the text copied below: "She would like to see Dr. Judeth Horn prior to being referred to [facility].  Appt scheduled 06/25/23 at 2:30. Nothing further needed."

## 2023-05-02 NOTE — Telephone Encounter (Signed)
Pt verbalized understanding, nfn

## 2023-05-11 ENCOUNTER — Ambulatory Visit (INDEPENDENT_AMBULATORY_CARE_PROVIDER_SITE_OTHER): Payer: Medicare Other

## 2023-05-11 DIAGNOSIS — R55 Syncope and collapse: Secondary | ICD-10-CM

## 2023-05-11 LAB — CUP PACEART REMOTE DEVICE CHECK
Date Time Interrogation Session: 20241205230219
Implantable Pulse Generator Implant Date: 20210824

## 2023-05-24 ENCOUNTER — Ambulatory Visit: Payer: Medicare Other | Admitting: Podiatry

## 2023-06-15 ENCOUNTER — Ambulatory Visit: Payer: Medicare Other

## 2023-06-15 DIAGNOSIS — R55 Syncope and collapse: Secondary | ICD-10-CM | POA: Diagnosis not present

## 2023-06-15 LAB — CUP PACEART REMOTE DEVICE CHECK
Date Time Interrogation Session: 20250109230154
Implantable Pulse Generator Implant Date: 20210824

## 2023-06-19 ENCOUNTER — Ambulatory Visit: Payer: Medicare Other | Admitting: Neurology

## 2023-06-22 DIAGNOSIS — N281 Cyst of kidney, acquired: Secondary | ICD-10-CM | POA: Diagnosis not present

## 2023-06-22 DIAGNOSIS — N393 Stress incontinence (female) (male): Secondary | ICD-10-CM | POA: Diagnosis not present

## 2023-06-22 DIAGNOSIS — R102 Pelvic and perineal pain: Secondary | ICD-10-CM | POA: Diagnosis not present

## 2023-06-25 ENCOUNTER — Encounter: Payer: Self-pay | Admitting: Pulmonary Disease

## 2023-06-25 ENCOUNTER — Ambulatory Visit: Payer: Medicare Other | Admitting: Pulmonary Disease

## 2023-06-25 VITALS — BP 162/80 | HR 108 | Temp 97.5°F | Ht 63.5 in | Wt 121.8 lb

## 2023-06-25 DIAGNOSIS — J479 Bronchiectasis, uncomplicated: Secondary | ICD-10-CM | POA: Diagnosis not present

## 2023-06-25 MED ORDER — FLUTICASONE PROPIONATE 50 MCG/ACT NA SUSP: 1.0000 | Freq: Every day | NASAL | 2 refills | Status: AC

## 2023-06-25 NOTE — Progress Notes (Signed)
@Patient  ID: [name], female    DOB: [birth year], 89 y.o.   MRN: [id number]  Chief Complaint  Patient presents with   Follow-up    Cough- dry/wet cough. White mucous. Tried nasal spray 3X a week. Vicks rub under nose. Weakness from antibiotics      Referring provider: Verl Blalock  HPI:   89 y.o. woman with history of bronchiectasis from you are seeing for evaluation of the same.  Most recent pulmonary note, Francella Solian MD reviewed.  Returns for routine follow-up.  Dr. Sherene Sires told her nothing more can be done.  He mentions something about [facility] referral in his note.  Not mentioned to her son nor her at visit.  Referral was sent but they were not aware of it so did not want to go.  Cough is unchanged.  Throughout the day.  White phlegm.  Worse in the evenings and first thing in the morning.  That bouts of cough although in the A-fib.  Things a bit better with Afrin.  HPI initial visit Baseline dyspnea.  Gradually worse over time.  No new maintenance inhaler.  Main complaint is sinus congestion.  Ear pain, discomfort etc.  Reviewed serial chest CTs that show mild worsening of nodular opacities with scattered significant bronchiectasis particularly in the lower and middle lobes.  We discussed at length strategies to help treat this including dyspnea etc.  Reviewed PFTs show obstruction in 2016.  Questionaires / Pulmonary Flowsheets:   ACT:      No data to display          MMRC:     No data to display          Epworth:      No data to display          Tests:   FENO:  No results found for: "NITRICOXIDE"  PFT:    Latest Ref Rng & Units 05/24/2015    3:42 PM  PFT Results  FVC-Pre L 2.21   FVC-Predicted Pre % 85   FVC-Post L 2.25   FVC-Predicted Post % 87   Pre FEV1/FVC % % 66   Post FEV1/FCV % % 69   FEV1-Pre L 1.46   FEV1-Predicted Pre % 76   FEV1-Post L 1.55   DLCO uncorrected ml/min/mmHg 15.11   DLCO UNC% % 62   DLVA Predicted % 88    TLC L 4.60   TLC % Predicted % 91   RV % Predicted % 98   Personally viewed and interpreted as mild fixed obstruction, no significant bronchodilator spots, lung volumes are normal limits, DLCO moderately reduced  WALK:      No data to display          Imaging: Personally reviewed and as per EMR discussion this note CUP PACEART REMOTE DEVICE CHECK Result Date: 06/15/2023 ILR summary report received. Battery status OK. Normal device function. No new tachy, brady, or pause episodes. 1 new AF episodes, burden 0.8%. 1 symptom activation with AF.  Burden 0.8%, Pt has declined OAC and rate control medications.  Monthly summary  reports and ROV/PRN LA, CVRS   Lab Results: Personally reviewed CBC    Component Value Date/Time   WBC 11.0 (H) 01/19/2023 1615   WBC 9.8 07/02/2022 0600   RBC 4.69 01/19/2023 1615   RBC 3.89 07/02/2022 0600   HGB 14.3 01/19/2023 1615   HCT 42.6 01/19/2023 1615   PLT 319 01/19/2023 1615   MCV 91 01/19/2023 1615  MCH 30.5 01/19/2023 1615   MCH 31.1 07/02/2022 0600   MCHC 33.6 01/19/2023 1615   MCHC 33.2 07/02/2022 0600   RDW 13.1 01/19/2023 1615   LYMPHSABS 0.7 07/01/2022 1439   MONOABS 1.0 07/01/2022 1439   EOSABS 0.0 07/01/2022 1439   BASOSABS 0.1 07/01/2022 1439    BMET    Component Value Date/Time   NA 138 01/19/2023 1615   K 4.6 01/19/2023 1615   CL 101 01/19/2023 1615   CO2 25 01/19/2023 1615   GLUCOSE 79 01/19/2023 1615   GLUCOSE 99 07/02/2022 0600   BUN 14 01/19/2023 1615   CREATININE 0.87 01/19/2023 1615   CREATININE 0.84 05/11/2015 1009   CALCIUM 9.6 01/19/2023 1615   GFRNONAA 38 (L) 07/02/2022 0600   GFRAA 52 (L) 12/12/2019 1313    BNP No results found for: "BNP"  ProBNP No results found for: "PROBNP"  Specialty Problems       Pulmonary Problems   Obstructive bronchiectasis (HCC)   Onset ? Age 42 wit bronchiectasis first confirmed 2007  - Alpha one Screen  August 18, 2010 = MM  - See CT Chest 09/29/05 Stable  bronchiectasis in right middle lobe and lingula.  Scattered tree in bud opacities throughout the lungs, right greater than left,  nonspecific.   - PFT's 06/21/2012 1.67 (91%) ratio 64 and no change,  DLCO 77% - Flutter valve added 04/27/2014  - PFTs 05/24/15   FEV1 1.55 (81%) ratio 69 p 6% improvement from saba and dlco 62% ratio 88%  - HRCT 04/04/16 >>> 1. 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 2. An incidental finding of potential clinical significance has been found. Dominant nodule in the right upper lobe, likely infectious/inflammatory in etiology.  - Zmax 250 mg daily 03/01/16 - 05/01/16 improved cough at d/c but diarrhea while on it and no change in nodule RUL > d/c 07/07/16 - 08/28/2017 added back cycles of zpak prn flare of purulent bronchitis > d/c 05/20/2018  - levaquin 500 x 10 days prn 06/11/2018 - flutter valve training 06/24/18 Chest CT 09/09/2018 Increased tree-in-bud nodularity in right lower lobe since 2016 -10/11/2020  new L lung mid nodule noted and likely inflammatory  - 01/17/2021    Am sputum samples>>>not done as of 08/01/2021 and cxr worse so rec BAL but get HRCT 1st  - 08/09/21  HRCT minimal change since 2017 so no BAL indicated  - 01/10/2022 reported cough worse p supper > resume gerd rx and do  MBS  (see uacs)            Bronchitis, chronic (HCC)   Upper airway cough syndrome   Trial of max gerd rx 11/24/2015 >> worse off rx 03/29/2016 and 08/28/2017 > resume ppi qam ac> resolved as of 11/30/2017   - Flared again 04/2018 > rechallenged 06/11/2018 and did not follow instructions> resolved 07/22/2018  - worse again 09/19/2019 rec increase pepcid to 20 mg bid and diet/bed blocks, f/u ent prn ([facility]) - MBS  02/21/22  see dyshagia                                       Acute bronchitis    Allergies  Allergen  Reactions   Hydralazine Hcl Shortness Of Breath    With throat numbness/tingling and swelling   Ciprofloxacin Other (See Comments)  triggered a migraine that was thought to be a TIA   Penicillins Rash    Has patient had a PCN reaction causing immediate rash, facial/tongue/throat swelling, SOB or lightheadedness with hypotension: Yes Has patient had a PCN reaction causing severe rash involving mucus membranes or skin necrosis: No Has patient had a PCN reaction that required hospitalization: No Has patient had a PCN reaction occurring within the last 10 years: No If all of the above answers are "NO", then may proceed with Cephalosporin use.   Sulfonamide Derivatives Swelling   Apixaban Other (See Comments)    weakness   Avelox [Moxifloxacin] Other (See Comments)    Rash and severe tingling/numbness    Band-Aid Infection Defense [Bacitracin-Polymyxin B] Other (See Comments)    unknown   Dome-Paste Bandage [Wound Dressings] Hives   Mannitol     Unknown reaction   Multaq [Dronedarone]     Muscle aches   Nitrofurantoin Monohyd Macro      due to lung disease   Other Other (See Comments)    Water for injection, sterile - unknown   Reclast [Zoledronic Acid] Other (See Comments)    hypertension   Sulfa Antibiotics Other (See Comments)    unknown   Chlorpheniramine Other (See Comments)    Headache, stomach ache, bladder "not working well"   Prednisone Rash    Immunization History  Administered Date(s) Administered   Influenza Split 05/20/2007, 04/27/2010, 03/01/2011, 03/08/2012, 03/24/2014, 06/15/2016   Influenza Whole 04/28/2009   Influenza, Seasonal, Injecte, Preservative Fre 04/05/2014, 04/06/2015   PFIZER(Purple Top)SARS-COV-2 Vaccination 08/22/2019, 09/17/2019   Pneumococcal Conjugate-13 04/27/2014   Pneumococcal Polysaccharide-23 01/22/2001, 04/28/2009, 06/05/2010, 04/05/2014   Tetanus 06/06/2003   Tetanus Immune Globulin 06/05/2008    Past Medical History:  Diagnosis  Date   Arm numbness left    Back pain    BCC (basal cell carcinoma of skin) 03/16/1986   Left Shoulder (curet and excision)   BCC (basal cell carcinoma of skin) 03/10/1988   Mid Back (tx p bx)   BCC (basal cell carcinoma of skin) 01/28/1998   Upper Lip (curet and excision)   BCC (basal cell carcinoma of skin) 10/24/2005   Right Mid Back (curet and 5FU)   Bronchitis, chronic (HCC)    Depression    Dizziness    Fatigue    Hypertension    mild   Obstructive bronchiectasis (HCC) 08/28/2007    Followed in Pulmonary clinic/ [facility]/ [facility]  Onset ? Age 45 wit bronchiectasis first confirmed 2007  - Alpha one Screen  August 18, 2010 = MM  - See CT Chest 09/29/05 Stable bronchiectasis in right middle lobe and lingula.  Scattered tree in bud opacities throughout the lungs, right greater than left,  nonspecific.   - PFT's 06/21/2012 1.67 (91%) ratio 64 and no change,  DLCO 77% - Flutte   Palpitations    SCCA (squamous cell carcinoma) of skin 05/08/2011   Left Hand (in situ) (tx p bx)   Skin cancer    Stress    Superficial basal cell carcinoma (BCC) 07/11/1995   Top Right Upper Leg (curet and 5FU)   Superficial basal cell carcinoma (BCC) 01/28/1998   Right Back, Upper Thigh (curet and cautery)   Superficial basal cell carcinoma (BCC) 02/18/2002   Upper Inner Right Leg (curet and cautery)   Superficial basal cell carcinoma (BCC) 02/18/2002   Lower Abdomen (curet and 5FU)   Superficial basal cell carcinoma (BCC) 01/27/2013   Right Inner Thigh (tx p bx)  Syncope    History of     Tobacco History: Social History   Tobacco Use  Smoking Status Never  Smokeless Tobacco Never   Counseling given: Not Answered   Continue to not smoke  Outpatient Encounter Medications as of 06/25/2023  Medication Sig   acetaminophen (TYLENOL) 500 MG tablet Take 500 mg by mouth every 6 (six) hours as needed.   HYDROcodone-acetaminophen (NORCO/VICODIN) 5-325 MG tablet Take 1 tablet by mouth 2 (two)  times daily as needed for severe pain.   Misc. Devices MISC by Does not apply route. Flutter valve when needed for cough   [DISCONTINUED] terbinafine (LAMISIL) 250 MG tablet Take 1 tablet (250 mg total) by mouth daily. (Patient not taking: Reported on 06/25/2023)   No facility-administered encounter medications on file as of 06/25/2023.     Review of Systems  Review of Systems  No chest pain exertion.  No orthopnea or PND.  Comprehensive review of systems otherwise negative. Physical Exam  BP (!) 162/80 (BP Location: Left Arm, Patient Position: Sitting)   Pulse (!) 108   Temp (!) 97.5 F (36.4 C) (Oral)   Ht 5' 3.5" (1.613 m)   Wt 121 lb 12.8 oz (55.2 kg)   SpO2 97%   BMI 21.24 kg/m   Wt Readings from Last 5 Encounters:  06/25/23 121 lb 12.8 oz (55.2 kg)  04/17/23 123 lb 9.6 oz (56.1 kg)  04/05/23 124 lb 9.6 oz (56.5 kg)  01/19/23 123 lb (55.8 kg)  01/02/23 123 lb 9.6 oz (56.1 kg)    BMI Readings from Last 5 Encounters:  06/25/23 21.24 kg/m  04/17/23 21.22 kg/m  04/05/23 21.39 kg/m  01/19/23 21.11 kg/m  01/02/23 21.22 kg/m     Physical Exam General: Sitting in chair, no acute distress Eyes: EOMI, icterus Neck: Supple, no JVP Pulmonary: Clear, no work of breathing Abdomen: Nondistended bowel sounds present Cardiovascular: Warm, no edema MSK: No synovitis, no joint effusion Neuro: Normal gait, no weakness Psych: Normal mood, full affect   Assessment & Plan:   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  Mildly worsened over time.  Treatment of NTM disease.  Discussed at length importance of airway to hopefully minimize sputum buildup and worsening disease.  Continue flutter valve.  Given ongoing daily productive cough vest therapy ordered today.  Twice a day.  Flutter valve to be used while doing vest therapy.  Sinus congestion: Continue Mucinex, Afrin 2 sprays his nostril twice a day up to 3 days at a time with 1  to 2-week break in between, encouraged ongoing and over-the-counter antihistamine.  Some improvement.  Add Flonase, new prescription today.  DOE: Likely of bronchiectasis, had mild fixed obstruction in 2016.  Likely main contributor.  Trial Stiolto 2 puffs once daily via samples at last visit.  She did not start this.  Will hold off on additional inhalers at this time while focusing on airway clearance as above.  Return in about 3 months (around 09/23/2023).   Karren Burly, MD 06/25/2023   This appointment required 41 minutes of patient care (this includes precharting, chart review, review of results, face-to-face care, etc.).

## 2023-06-25 NOTE — Patient Instructions (Signed)
Nice to see you again  I sent a prescription for Flonase 1 spray each nostril once a day, see if this continues to help with some of the cough since the other nasal spray did  Over-the-counter nasal spray, Afrin, can continue to be used 3 days at a time every week or 2  I put a new order in for a vest, this is designed to provide percussion or gentle tapping to the chest to help loosen the mucus  Do this 15 to 20 minutes twice a day morning and evening.  While you are doing the best I recommend using the flutter valve at the same time.  Return to clinic in 3 months or sooner as needed with Dr. Judeth Horn

## 2023-07-11 ENCOUNTER — Telehealth: Payer: Self-pay | Admitting: Pulmonary Disease

## 2023-07-11 NOTE — Telephone Encounter (Signed)
 Per Thomasina with [employer], they need the following info updated in pt's notes to get approved by patient's insurance:    I have sent it off and received a clinical request from insurance. They just need to see failure of standard treatments and difficulty mobilizing secretions. If you could get Dr. Annella to add that to his progress I should be able to get this approved and hopefully have her set up soon. To make it easier, I have typed out what needs to be added below to get it pushed through. Let me know if there are any questions. Thanks!  She has had a daily productive cough for more than 24 months and has been using a flutter valve since 2015, but it is no longer mobilizing secretions the same and she is retaining mucous. CPT has been ruled out as she does not have a caregiver available to perform therapy.

## 2023-07-12 NOTE — Telephone Encounter (Signed)
Dr. Hunsucker, please advise. Thanks 

## 2023-07-13 NOTE — Telephone Encounter (Signed)
 Spoke to [place] with [employer] via email. He stated that this documentation is required by insurance.

## 2023-07-20 ENCOUNTER — Ambulatory Visit (INDEPENDENT_AMBULATORY_CARE_PROVIDER_SITE_OTHER): Payer: Medicare Other

## 2023-07-20 DIAGNOSIS — R55 Syncope and collapse: Secondary | ICD-10-CM

## 2023-07-20 NOTE — Progress Notes (Signed)
[facility] Summary Report / Loop Recorder

## 2023-07-21 LAB — CUP PACEART REMOTE DEVICE CHECK
Date Time Interrogation Session: 20250213230035
Implantable Pulse Generator Implant Date: 20210824

## 2023-07-27 NOTE — Progress Notes (Signed)
 Cardiology Office Note Date:  07/27/2023  Patient ID:  [name], [name] [birth year], MRN [id number] PCP:  Sheliah Hatch, PA-C  Electrophysiologist: Dr. Elberta Fortis    Chief Complaint:   3 mo  History of Present Illness: [name] is a 89 y.o. female with history of HTN, PAFib/SVT, chronic bronchitis  Admitted 07/01/22 after a syncopal event, had not eaten much that day, had soe elevated HRs and took her diltiazem, started to feel dizzy, ultimately had a syncopal event in the [place] Loop interrogated, noted that she had been having multiple episodes of SVTs though none of late, started on Flecainide. Unclear cause of her syncope, though treated for diverticultis Discharged 07/02/22  I saw her 08/01/22 She is feeling fairly terrible She has self stopped cost of her medicines, including the flecainide and xarelto. She reports the antibiotics made her feel awful, terrible GI side effects, though did complete them She reports she has had near syncope in the past and both times associated with times of infection. She is now on Cipro for a UTI.  The antibiotic making all of her joints ache.  This is how she feels from Cipro having been on it before, but told that it was the antibiotic needed to clear her infection so she is "struggling through it". She really does not want to consider another medication. No CP, no palpitations or cardiac awareness, no SOB No near syncope or syncope. She had no bleeding trouble and no particular side effects with Xarelto, but stopped it because she was feeling so bad overall. She doesn't think she has had any AFib, generally can tell and feels like the PRN diltiazem works well in settling it  We discussed at length rational for [facility], she remained unagreeable to resume. Advised diltiazem for some RVR she also was unagreeable to this. Planned to see her once she was off antibiotics, hopefully feeling better with resolution of UTI and revisit meds  I saw  her 11/23/22 She never really feels well, but does feel better, very much attributes that to being off the medications and completing the antibiotics She does not think she had had any Afib No CP, palpitations No near syncope or syncope. Chronic cough with her bronchiectasis Remained off AAD and OAC, did not want to resume Noted to have some PVCs with her ILR interrogation and PVC counter turned on to assess burden  I saw her 01/26/23 She is alone today Still just not feeling well. Goes back to January, reminisces on how terrible she felt, and how the antibiotics nearly killed her, and she has just never felt well again. No CP, palpitations. She is tired all of the time She is not taking metoprolol on her list, never started it Does have PRN dilt but rarely feels the need to take it She feels a constant sense of low grade dizziness, no energy. 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 No near syncope or syncope. She requests labs today to see if that may explain her ongoing fatigue Planned for echo Advised f/u with her PMD  Echo labs looked good, nothing to explain her fatigue  I saw her 04/17/23 She feels like she may be feeling a bit better Again, reminisces about early in the year and the antibiotics that made her feel so awful Feels a bit defeated, pulmonology team no longer feels they have much to add to benefit and advised she see another specialist in [facility] No near syncope or syncope  Occasionally lightheaded Reports BP at home is 140/70 generally A recheck on her BP remains 160/70 She does not want any medications for her BP No CP Baseline cough/SOB Low AFib/PVC burden No changes were made  TODAY  Never really feels good unfortunately. Her chronic bronchitis is bothersome Chronic neck/R scapular pain Chronic dizziness, poor gait stability.  Denies falls No CP No near syncope or syncope Primary team has discussed a BP medication, but not  started it yet  Device information MDT LINQ II, implanted 01/27/2020   Past Medical History:  Diagnosis Date   Arm numbness left    Back pain    BCC (basal cell carcinoma of skin) 03/16/1986   Left Shoulder (curet and excision)   BCC (basal cell carcinoma of skin) 03/10/1988   Mid Back (tx p bx)   BCC (basal cell carcinoma of skin) 01/28/1998   Upper Lip (curet and excision)   BCC (basal cell carcinoma of skin) 10/24/2005   Right Mid Back (curet and 5FU)   Bronchitis, chronic (HCC)    Depression    Dizziness    Fatigue    Hypertension    mild   Obstructive bronchiectasis (HCC) 08/28/2007    Followed in Pulmonary clinic/ [facility]/ [place]  Onset ? Age 16 wit bronchiectasis first confirmed 2007  - Alpha one Screen  August 18, 2010 = MM  - See CT Chest 09/29/05 Stable bronchiectasis in right middle lobe and lingula.  Scattered tree in bud opacities throughout the lungs, right greater than left,  nonspecific.   - PFT's 06/21/2012 1.67 (91%) ratio 64 and no change,  DLCO 77% - Flutte   Palpitations    SCCA (squamous cell carcinoma) of skin 05/08/2011   Left Hand (in situ) (tx p bx)   Skin cancer    Stress    Superficial basal cell carcinoma (BCC) 07/11/1995   Top Right Upper Leg (curet and 5FU)   Superficial basal cell carcinoma (BCC) 01/28/1998   Right Back, Upper Thigh (curet and cautery)   Superficial basal cell carcinoma (BCC) 02/18/2002   Upper Inner Right Leg (curet and cautery)   Superficial basal cell carcinoma (BCC) 02/18/2002   Lower Abdomen (curet and 5FU)   Superficial basal cell carcinoma (BCC) 01/27/2013   Right Inner Thigh (tx p bx)   Syncope    History of     Past Surgical History:  Procedure Laterality Date   APPENDECTOMY     BIOPSY  10/18/2022   Procedure: BIOPSY;  Surgeon: Charlott Rakes, MD;  Location: [facility];  Service: Gastroenterology;;   CESAREAN SECTION  [phone]   COLONOSCOPY WITH PROPOFOL N/A 10/18/2022   Procedure: COLONOSCOPY  WITH PROPOFOL;  Surgeon: Charlott Rakes, MD;  Location: [facility];  Service: Gastroenterology;  Laterality: N/A;   POLYPECTOMY  10/18/2022   Procedure: POLYPECTOMY;  Surgeon: Charlott Rakes, MD;  Location: [facility];  Service: Gastroenterology;;    Current Outpatient Medications  Medication Sig Dispense Refill   acetaminophen (TYLENOL) 500 MG tablet Take 500 mg by mouth every 6 (six) hours as needed.     fluticasone (FLONASE) 50 MCG/ACT nasal spray Place 1 spray into both nostrils daily. 16 g 2   HYDROcodone-acetaminophen (NORCO/VICODIN) 5-325 MG tablet Take 1 tablet by mouth 2 (two) times daily as needed for severe pain.     Misc. Devices MISC by Does not apply route. Flutter valve when needed for cough     No current facility-administered medications for this visit.    Allergies:  Hydralazine hcl, Ciprofloxacin, Penicillins, Sulfonamide derivatives, Apixaban, Avelox [moxifloxacin], Band-aid infection defense [bacitracin-polymyxin b], Dome-paste bandage [wound dressings], Mannitol, Multaq [dronedarone], Nitrofurantoin monohyd macro, Other, Reclast [zoledronic acid], Sulfa antibiotics, Chlorpheniramine, and Prednisone   Social History:  The patient  reports that she has never smoked. She has never used smokeless tobacco. She reports current alcohol use. She reports that she does not use drugs.   Family History:  The patient's family history includes Alcohol abuse in her father; Atrial fibrillation in her mother; Thyroid disease in her mother.  ROS:  Please see the history of present illness.    All other systems are reviewed and otherwise negative.   PHYSICAL EXAM:  VS:  There were no vitals taken for this visit. BMI: There is no height or weight on file to calculate BMI. Well nourished, thin body habitus, extremely well dressed, well developed, in no acute distress HEENT: normocephalic, atraumatic Neck: no JVD, carotid bruits or masses Cardiac: RRR; no significant murmurs,  no rubs, or gallops Lungs: CTA b/l, no wheezing, rhonchi or rales Abd: soft, nontender MS: no deformity, age appropriate atrophy Ext: no edema Skin: warm and dry, no rash Neuro:  No gross deficits appreciated Psych: euthymic mood, full affect  ILR site is stable   EKG: not done today  Device interrogation transmission today and reviewed by myself  Battery is good One new AFib episode (since Dec), just over 6 hours Burden is 05% Rate controlled   02/09/23: TTE 1. Left ventricular ejection fraction, by estimation, is 60 to 65%. The  left ventricle has normal function. The left ventricle has no regional  wall motion abnormalities. Left ventricular diastolic parameters are  consistent with Grade I diastolic  dysfunction (impaired relaxation).   2. Right ventricular systolic function is normal. The right ventricular  size is normal. There is normal pulmonary artery systolic pressure. The  estimated right ventricular systolic pressure is 27.0 mmHg.   3. Right atrial size was mildly dilated.   4. The mitral valve is normal in structure. Trivial mitral valve  regurgitation. No evidence of mitral stenosis.   5. The aortic valve is normal in structure. Aortic valve regurgitation is  moderate. Aortic valve sclerosis/calcification is present, without any  evidence of aortic stenosis. Aortic regurgitation PHT measures 396 msec.   6. The inferior vena cava is normal in size with greater than 50%  respiratory variability, suggesting right atrial pressure of 3 mmHg.     ECHO: 10/30/2021  1. Left ventricular ejection fraction, by estimation, is 60 to 65%. The  left ventricle has normal function. The left ventricle has no regional  wall motion abnormalities. Left ventricular diastolic parameters were  normal.   2. Right ventricular systolic function is normal. The right ventricular  size is normal. There is normal pulmonary artery systolic pressure.   3. The pericardial effusion is  anterior to the right ventricle.   4. The mitral valve is normal in structure. No evidence of mitral valve  regurgitation. No evidence of mitral stenosis.   5. The aortic valve is tricuspid. There is mild calcification of the  aortic valve. Aortic valve regurgitation is mild. Aortic valve sclerosis  is present, with no evidence of aortic valve stenosis.   6. The inferior vena cava is dilated in size with >50% respiratory  variability, suggesting right atrial pressure of 8 mmHg.   Recent Labs: 01/19/2023: BUN 14; Creatinine, Ser 0.87; Hemoglobin 14.3; Platelets 319; Potassium 4.6; Sodium 138  No results found for requested labs  within last 365 days.   CrCl cannot be calculated (Patient's most recent lab result is older than the maximum 21 days allowed.).   Wt Readings from Last 3 Encounters:  06/25/23 121 lb 12.8 oz (55.2 kg)  04/17/23 123 lb 9.6 oz (56.1 kg)  04/05/23 124 lb 9.6 oz (56.5 kg)     Other studies reviewed: Additional studies/records reviewed today include: summarized above  ASSESSMENT AND PLAN:  Paroxysmal Afib CHA2DS2Vasc is 4, self stopped xarelto Self stopped flecainide 0.5 %burden   We have had a long discussions in the past on rational for [facility] and her risk. She does not want to resume meds Understands stroke risk  She does not want any rate or BP control medications, extremely weary of all medications and side effects   HTN A recheck ins 160/80 She isdiscussing BP with her primary team   3. PVC No symptoms Low burden, 0.1 % burden Doubt source for her fatigue  4. Fatigue Chronic and perhaps improving  5. Secondary hypercoagulable state   Disposition:  back in 6 mo, sooner if needed     Current medicines are reviewed at length with the patient today.  The patient did not have any concerns regarding medicines.  Norma Fredrickson, PA-C 07/27/2023 12:13 PM     [facility] [address] [place] [address] [phone] (office)  [phone] (fax)

## 2023-07-31 ENCOUNTER — Encounter: Payer: Self-pay | Admitting: Physician Assistant

## 2023-07-31 ENCOUNTER — Ambulatory Visit: Payer: Medicare Other | Attending: Physician Assistant | Admitting: Physician Assistant

## 2023-07-31 VITALS — BP 170/84 | HR 88 | Ht 63.5 in | Wt 121.8 lb

## 2023-07-31 DIAGNOSIS — I10 Essential (primary) hypertension: Secondary | ICD-10-CM | POA: Diagnosis not present

## 2023-07-31 DIAGNOSIS — I493 Ventricular premature depolarization: Secondary | ICD-10-CM | POA: Diagnosis not present

## 2023-07-31 DIAGNOSIS — Z9889 Other specified postprocedural states: Secondary | ICD-10-CM

## 2023-07-31 DIAGNOSIS — D6869 Other thrombophilia: Secondary | ICD-10-CM

## 2023-07-31 DIAGNOSIS — I480 Paroxysmal atrial fibrillation: Secondary | ICD-10-CM | POA: Diagnosis not present

## 2023-07-31 NOTE — Patient Instructions (Signed)
 Medication Instructions:   Your physician recommends that you continue on your current medications as directed. Please refer to the Current Medication list given to you today.   *If you need a refill on your cardiac medications before your next appointment, please call your pharmacy*   Lab Work:  NONE ORDERED  TODAY    If you have labs (blood work) drawn today and your tests are completely normal, you will receive your results only by: MyChart Message (if you have MyChart) OR A paper copy in the mail If you have any lab test that is abnormal or we need to change your treatment, we will call you to review the results.   Testing/Procedures:  NONE ORDERED  TODAY       Follow-Up: At [facility], you and your health needs are our priority.  As part of our continuing mission to provide you with exceptional heart care, we have created designated Provider Care Teams.  These Care Teams include your primary Cardiologist (physician) and Advanced Practice Providers (APPs -  Physician Assistants and Nurse Practitioners) who all work together to provide you with the care you need, when you need it.  We recommend signing up for the patient portal called "MyChart".  Sign up information is provided on this After Visit Summary.  MyChart is used to connect with patients for Virtual Visits (Telemedicine).  Patients are able to view lab/test results, encounter notes, upcoming appointments, etc.  Non-urgent messages can be sent to your provider as well.   To learn more about what you can do with MyChart, go to [email or web address].    Your next appointment:    6 month(s)  Provider:    You may see Will Jorja Loa, MD or one of the following Advanced Practice Providers on your designated Care Team:   Francis Dowse, [place]   Other Instructions

## 2023-08-21 NOTE — Progress Notes (Signed)
 Carelink Summary Report / Loop Recorder

## 2023-08-21 NOTE — Addendum Note (Signed)
 Addended by: Elease Etienne A on: 08/21/2023 10:16 AM   Modules accepted: Orders

## 2023-08-24 ENCOUNTER — Ambulatory Visit: Payer: Medicare Other

## 2023-08-24 DIAGNOSIS — I4811 Longstanding persistent atrial fibrillation: Secondary | ICD-10-CM | POA: Diagnosis not present

## 2023-08-24 DIAGNOSIS — R55 Syncope and collapse: Secondary | ICD-10-CM | POA: Diagnosis not present

## 2023-08-24 DIAGNOSIS — I480 Paroxysmal atrial fibrillation: Secondary | ICD-10-CM | POA: Diagnosis not present

## 2023-08-24 DIAGNOSIS — I10 Essential (primary) hypertension: Secondary | ICD-10-CM | POA: Diagnosis not present

## 2023-08-24 LAB — CUP PACEART REMOTE DEVICE CHECK
Date Time Interrogation Session: 20250320230024
Implantable Pulse Generator Implant Date: 20210824

## 2023-09-03 DIAGNOSIS — I10 Essential (primary) hypertension: Secondary | ICD-10-CM | POA: Diagnosis not present

## 2023-09-03 DIAGNOSIS — I4811 Longstanding persistent atrial fibrillation: Secondary | ICD-10-CM | POA: Diagnosis not present

## 2023-09-03 DIAGNOSIS — I480 Paroxysmal atrial fibrillation: Secondary | ICD-10-CM | POA: Diagnosis not present

## 2023-09-22 DIAGNOSIS — I4811 Longstanding persistent atrial fibrillation: Secondary | ICD-10-CM | POA: Diagnosis not present

## 2023-09-22 DIAGNOSIS — I480 Paroxysmal atrial fibrillation: Secondary | ICD-10-CM | POA: Diagnosis not present

## 2023-09-22 DIAGNOSIS — I10 Essential (primary) hypertension: Secondary | ICD-10-CM | POA: Diagnosis not present

## 2023-09-28 ENCOUNTER — Ambulatory Visit (INDEPENDENT_AMBULATORY_CARE_PROVIDER_SITE_OTHER): Payer: Medicare Other

## 2023-09-28 DIAGNOSIS — R55 Syncope and collapse: Secondary | ICD-10-CM

## 2023-09-28 LAB — CUP PACEART REMOTE DEVICE CHECK
Date Time Interrogation Session: 20250424230318
Implantable Pulse Generator Implant Date: 20210824

## 2023-10-01 ENCOUNTER — Ambulatory Visit: Payer: Self-pay | Admitting: Family Medicine

## 2023-10-01 ENCOUNTER — Ambulatory Visit: Payer: Medicare Other | Admitting: Pulmonary Disease

## 2023-10-01 NOTE — Telephone Encounter (Signed)
 The note indicates she has been triaged to ED - nothing further to add

## 2023-10-01 NOTE — Telephone Encounter (Signed)
**Note De-identified  [place] Obfuscation** Please advise 

## 2023-10-01 NOTE — Telephone Encounter (Signed)
 Chief Complaint: Dizziness since this morning  Symptoms: Loss of taste, weakness, stomach pain, headache this morning (now gone) Pertinent Negatives: Patient denies chest pain, difficulty breathing  Disposition: [x] ED  Additional Notes:  Pt states she has progressively gotten weaker and dizzier today. Pt states she has a cardiologist who is monitoring her for a fib and she does not feel like she is in it. This RN advised pt to go to ED. This RN offered to call pt an ambulance but pt states her neighbor will take her there now.     Copied from CRM [id number]. Topic: Clinical - Red Word Triage >> Oct 01, 2023  1:36 PM Hilton Lucky wrote: Red Word that prompted transfer to Nurse Triage: Patient is stating she feels like she's about to pass out. Got up with a headache, son has just recovered from covid, states is 'crazy in the head'. States has AFIB and feels she may need to call for help. Reason for Disposition  [1] SEVERE weakness (i.e., unable to walk or barely able to walk, requires support) AND [2] new-onset or worsening  Answer Assessment - Initial Assessment Questions Chief Complaint: Dizziness since this morning  Symptoms: Loss of taste, weakness, stomach pain, headache this morning (now gone)  Pertinent Negatives: Patient denies chest pain, difficulty breathing  Protocols used: Weakness (Generalized) and Fatigue-A-AH

## 2023-10-02 NOTE — Progress Notes (Signed)
 Carelink Summary Report / Loop Recorder

## 2023-10-03 DIAGNOSIS — I4811 Longstanding persistent atrial fibrillation: Secondary | ICD-10-CM | POA: Diagnosis not present

## 2023-10-03 DIAGNOSIS — I480 Paroxysmal atrial fibrillation: Secondary | ICD-10-CM | POA: Diagnosis not present

## 2023-10-03 DIAGNOSIS — I10 Essential (primary) hypertension: Secondary | ICD-10-CM | POA: Diagnosis not present

## 2023-10-12 DIAGNOSIS — R1012 Left upper quadrant pain: Secondary | ICD-10-CM | POA: Diagnosis not present

## 2023-10-22 DIAGNOSIS — I480 Paroxysmal atrial fibrillation: Secondary | ICD-10-CM | POA: Diagnosis not present

## 2023-10-22 DIAGNOSIS — I4811 Longstanding persistent atrial fibrillation: Secondary | ICD-10-CM | POA: Diagnosis not present

## 2023-10-22 DIAGNOSIS — I10 Essential (primary) hypertension: Secondary | ICD-10-CM | POA: Diagnosis not present

## 2023-11-02 ENCOUNTER — Ambulatory Visit (INDEPENDENT_AMBULATORY_CARE_PROVIDER_SITE_OTHER): Payer: Medicare Other

## 2023-11-02 ENCOUNTER — Ambulatory Visit: Payer: Self-pay | Admitting: Cardiology

## 2023-11-02 DIAGNOSIS — K5900 Constipation, unspecified: Secondary | ICD-10-CM | POA: Diagnosis not present

## 2023-11-02 DIAGNOSIS — K5790 Diverticulosis of intestine, part unspecified, without perforation or abscess without bleeding: Secondary | ICD-10-CM | POA: Diagnosis not present

## 2023-11-02 DIAGNOSIS — R1012 Left upper quadrant pain: Secondary | ICD-10-CM | POA: Diagnosis not present

## 2023-11-02 DIAGNOSIS — R1032 Left lower quadrant pain: Secondary | ICD-10-CM | POA: Diagnosis not present

## 2023-11-02 DIAGNOSIS — R55 Syncope and collapse: Secondary | ICD-10-CM

## 2023-11-02 LAB — CUP PACEART REMOTE DEVICE CHECK
Date Time Interrogation Session: 20250529232008
Implantable Pulse Generator Implant Date: 20210824

## 2023-11-03 DIAGNOSIS — I10 Essential (primary) hypertension: Secondary | ICD-10-CM | POA: Diagnosis not present

## 2023-11-03 DIAGNOSIS — I4811 Longstanding persistent atrial fibrillation: Secondary | ICD-10-CM | POA: Diagnosis not present

## 2023-11-03 DIAGNOSIS — I480 Paroxysmal atrial fibrillation: Secondary | ICD-10-CM | POA: Diagnosis not present

## 2023-11-06 NOTE — Progress Notes (Signed)
 Carelink Summary Report / Loop Recorder

## 2023-11-21 DIAGNOSIS — I480 Paroxysmal atrial fibrillation: Secondary | ICD-10-CM | POA: Diagnosis not present

## 2023-11-21 DIAGNOSIS — I4811 Longstanding persistent atrial fibrillation: Secondary | ICD-10-CM | POA: Diagnosis not present

## 2023-11-21 DIAGNOSIS — I10 Essential (primary) hypertension: Secondary | ICD-10-CM | POA: Diagnosis not present

## 2023-11-27 DIAGNOSIS — Z1331 Encounter for screening for depression: Secondary | ICD-10-CM | POA: Diagnosis not present

## 2023-11-27 DIAGNOSIS — Z6821 Body mass index (BMI) 21.0-21.9, adult: Secondary | ICD-10-CM | POA: Diagnosis not present

## 2023-11-27 DIAGNOSIS — Z0000 Encounter for general adult medical examination without abnormal findings: Secondary | ICD-10-CM | POA: Diagnosis not present

## 2023-11-27 DIAGNOSIS — G894 Chronic pain syndrome: Secondary | ICD-10-CM | POA: Diagnosis not present

## 2023-12-03 ENCOUNTER — Ambulatory Visit: Payer: Self-pay | Admitting: Cardiology

## 2023-12-03 ENCOUNTER — Ambulatory Visit

## 2023-12-03 DIAGNOSIS — I4811 Longstanding persistent atrial fibrillation: Secondary | ICD-10-CM | POA: Diagnosis not present

## 2023-12-03 DIAGNOSIS — R55 Syncope and collapse: Secondary | ICD-10-CM | POA: Diagnosis not present

## 2023-12-03 DIAGNOSIS — I480 Paroxysmal atrial fibrillation: Secondary | ICD-10-CM | POA: Diagnosis not present

## 2023-12-03 DIAGNOSIS — I10 Essential (primary) hypertension: Secondary | ICD-10-CM | POA: Diagnosis not present

## 2023-12-03 LAB — CUP PACEART REMOTE DEVICE CHECK
Date Time Interrogation Session: 20250629231316
Implantable Pulse Generator Implant Date: 20210824

## 2023-12-11 NOTE — Progress Notes (Signed)
 Subjective:    Patient ID: [name], female    DOB: Oct 12, 1932    MRN: [id number]      Brief patient profile:  89 yowf never smoker/ MM/ MM   with recurrent cough since age 89 with right middle lobe/lingular syndrome with limited bronchiectatic changes on  CT study dated 09/2005    History of Present Illness   03/01/2016  f/u ov/[name] re:  Bronchiectasis with uacs/ some better on gerd rx  Chief Complaint  Patient presents with   Follow-up    Pt states her dry cough is unchanged since last OV. Pt states she has ocassional chest tightness and SOB but attributes this to stress. Pt also c/o hoarseness.    coughs up to an hour each am, completely non-productive even with flutter  rec No change in medications Nasty mucus > doxycline 100 mg twice daily x 10 days   Late add for nodular opacity RUL c/w MAI    zmax  x 250 mg daily x 30 days Zmax  250 mg daily 03/01/16 - 05/01/16 improved cough at d/c but diarrhea while on it and no change in nodule RUL > d/c/d 07/07/16    Hunsucker recs  01/02/23  For the nasal sinus and ear congestion, use Afrin 2 sprays each nostril twice a day for 3 days then stop Try Mucinex  D Consider adding loratadine or generic Claritin in the coming days if you are not getting relief If you still are experiencing shortness of breath in the coming weeks despite improvement in the congestion, use Stiolto 2 puffs once a day in the morning. See if this helps with shortness of breath  I provided some sputum cups, lets see if you can cough some mucus into the stomach and sent to the lab to see if anything grows   04/05/2023  f/u ov/[name] re: bronchiectasis maint on no medications  / did not follow above instructions Chief Complaint  Patient presents with   Follow-up    Cough persistent.   Dyspnea:  hips slow  her  down before her breathing  Cough: mucus is clear and thick /worse in am / does not recall benefit from mucinex  dm or using mucinex  d as instructed for nasal obst   Sleeping: bed blocks s  resp cc  SABA use: none  02: none Rec I strongly recommend you follow Dr Hunsucker's recommendations or let him send you to [facility] Follow up in this clinic is as needed    Hunsucker ov  > rec VEST 06/25/23 > never came     12/13/2023  f/u ov/[name] re: bronchiectasis    maint on ppi q am but not ac / no levaquin  this year   Chief Complaint  Patient presents with   Follow-up  Dyspnea:  does fine at [employer] leaning on cart  Cough: mostly dry worst p stir in am  Sleeping: bed bocks and 2 pillows s  resp cc p hydrocodone  for L hip SABA use: none  02: none    No obvious day to day or daytime variability or assoc excess/ purulent sputum or mucus plugs or hemoptysis or cp or chest tightness, subjective wheeze or overt sinus or hb symptoms.    Also denies any obvious fluctuation of symptoms with weather or environmental changes or other aggravating or alleviating factors except as outlined above   No unusual exposure hx or h/o childhood pna/ asthma or knowledge of premature birth.  Current Allergies, Complete Past Medical History, Past Surgical History, Family  History, and Social History were reviewed in [employer] record.  ROS  The following are not active complaints unless bolded Hoarseness, sore throat, dysphagia, dental problems, itching, sneezing,  nasal congestion or discharge of excess mucus or purulent secretions, ear ache,   fever, chills, sweats, unintended wt loss or wt gain, classically pleuritic or exertional cp,  orthopnea pnd or arm/hand swelling  or leg swelling, presyncope, palpitations, abdominal pain, anorexia, nausea, vomiting, diarrhea  or change in bowel habits or change in bladder habits, change in stools or change in urine, dysuria, hematuria,  rash, arthralgias, visual complaints, headache, numbness, weakness or ataxia or problems with walking or coordination,  change in mood or  memory.        Current Meds   Medication Sig   acetaminophen  (TYLENOL ) 500 MG tablet Take 500 mg by mouth every 6 (six) hours as needed.   fluticasone  (FLONASE ) 50 MCG/ACT nasal spray Place 1 spray into both nostrils daily.   HYDROcodone -acetaminophen  (NORCO/VICODIN) 5-325 MG tablet Take 1 tablet by mouth 2 (two) times daily as needed for severe pain.   Misc. Devices MISC by Does not apply route. Flutter valve when needed for cough   omeprazole  (PRILOSEC ) 40 MG capsule 1 capsule 1/2 to 1 hour before morning meal Orally Once a day; Duration: 30 days                Past Medical History:  BRONCHIECTASIS (ICD-494.0) see CT scan of the chest dated 09/29/2005  - Pneumovax April 28, 2009 (over 65 so last one)  Prevnar given 04/27/2014   - Alpha one Screen  August 18, 2010 = MM  - Sinus CT August 18, 2010 >>  neg  COUGH, CHRONIC (ICD-786.2)  Left Kidney Cyst  - 04/21/09 [facility] PROCEDURE(S): ULTRASOUND GUIDED AND FLUOROSCOPIC GUIDED LEFT RENAL CYST ASPIRATION AND SCLEROSIS  LUQ Abd pain 2011...............................................................SABRASchooler  - CT ABD 07/29/10 no etiology        Objective:   Physical Exam  Wt  12/13/2023          121  04/05/2023        124  11/03/2022         126 04/17/2022        124  01/10/2022           122  08/01/2021         126  01/17/2021         121  10/11/2020           118 08/02/2020         126   02/03/2020        124  Wt  09/19/2019  120  07/22/2018         122  05/20/2018       129  11/30/2017         131  March 15,2012 137      Vital signs reviewed  12/13/2023  - Note at rest 02 sats  99% on RA   General appearance:    stoic amb wf nad     HEENT : Oropharynx  clear      Nasal turbinates nl    NECK :  without  apparent JVD/ palpable Nodes/TM    LUNGS: no acc muscle use,  Nl contour chest with a few insp crackles  bilaterally without cough on insp or exp maneuvers   CV:  RRR  no s3 or murmur or increase in P2, and no edema  ABD:  soft and nontender   MS:   Gait nl   ext warm without deformities Or obvious joint restrictions  calf tenderness, cyanosis or clubbing    SKIN: warm and dry without lesions    NEURO:  alert, approp, nl sensorium with  no motor or cerebellar deficits apparent.    CXR PA and Lateral:   12/13/2023 :    I personally reviewed images and agree with radiology impression as follows:    No change vs priors My review:  c/w bronchectasis with non -specific scarring.   Labs ordered/ reviewed:      Chemistry      Component Value Date/Time   NA 130 (L) 12/13/2023 1519   NA 138 01/19/2023 1615   K 4.0 12/13/2023 1519   CL 96 12/13/2023 1519   CO2 28 12/13/2023 1519   BUN 13 12/13/2023 1519   BUN 14 01/19/2023 1615   CREATININE 0.89 12/13/2023 1519   CREATININE 0.84 05/11/2015 1009      Component Value Date/Time   CALCIUM  9.2 12/13/2023 1519   ALKPHOS 53 07/01/2022 1439   AST 37 07/01/2022 1439   ALT 14 07/01/2022 1439   BILITOT 0.8 07/01/2022 1439        Lab Results  Component Value Date   WBC 12.1 (H) 12/13/2023   HGB 14.4 12/13/2023   HCT 43.0 12/13/2023   MCV 91.4 12/13/2023   PLT 318.0 12/13/2023     Lab Results  Component Value Date   TSH 1.54 12/13/2023     Lab Results  Component Value Date   PROBNP 216.0 (H) 12/13/2023       Lab Results  Component Value Date   ESRSEDRATE 18 12/13/2023   ESRSEDRATE 16 06/11/2018   ESRSEDRATE 10 01/08/2011           Assessment & Plan:

## 2023-12-13 ENCOUNTER — Ambulatory Visit: Payer: Self-pay | Admitting: Internal Medicine

## 2023-12-13 ENCOUNTER — Ambulatory Visit: Admitting: Internal Medicine

## 2023-12-13 ENCOUNTER — Encounter: Payer: Self-pay | Admitting: Internal Medicine

## 2023-12-13 ENCOUNTER — Ambulatory Visit

## 2023-12-13 VITALS — BP 140/72 | HR 68 | Temp 97.8°F | Ht 64.0 in | Wt 121.4 lb

## 2023-12-13 DIAGNOSIS — J479 Bronchiectasis, uncomplicated: Secondary | ICD-10-CM

## 2023-12-13 DIAGNOSIS — R0609 Other forms of dyspnea: Secondary | ICD-10-CM | POA: Diagnosis not present

## 2023-12-13 DIAGNOSIS — R059 Cough, unspecified: Secondary | ICD-10-CM | POA: Diagnosis not present

## 2023-12-13 DIAGNOSIS — J42 Unspecified chronic bronchitis: Secondary | ICD-10-CM | POA: Diagnosis not present

## 2023-12-13 LAB — CBC WITH DIFFERENTIAL/PLATELET
Basophils Absolute: 0.1 K/uL (ref 0.0–0.1)
Basophils Relative: 0.6 % (ref 0.0–3.0)
Eosinophils Absolute: 0.1 K/uL (ref 0.0–0.7)
Eosinophils Relative: 0.8 % (ref 0.0–5.0)
HCT: 43 % (ref 36.0–46.0)
Hemoglobin: 14.4 g/dL (ref 12.0–15.0)
Lymphocytes Relative: 9.4 % — ABNORMAL LOW (ref 12.0–46.0)
Lymphs Abs: 1.1 K/uL (ref 0.7–4.0)
MCHC: 33.4 g/dL (ref 30.0–36.0)
MCV: 91.4 fl (ref 78.0–100.0)
Monocytes Absolute: 0.7 K/uL (ref 0.1–1.0)
Monocytes Relative: 5.8 % (ref 3.0–12.0)
Neutro Abs: 10.1 K/uL — ABNORMAL HIGH (ref 1.4–7.7)
Neutrophils Relative %: 83.4 % — ABNORMAL HIGH (ref 43.0–77.0)
Platelets: 318 K/uL (ref 150.0–400.0)
RBC: 4.71 Mil/uL (ref 3.87–5.11)
RDW: 13.1 % (ref 11.5–15.5)
WBC: 12.1 K/uL — ABNORMAL HIGH (ref 4.0–10.5)

## 2023-12-13 LAB — BASIC METABOLIC PANEL WITH GFR
BUN: 13 mg/dL (ref 6–23)
CO2: 28 meq/L (ref 19–32)
Calcium: 9.2 mg/dL (ref 8.4–10.5)
Chloride: 96 meq/L (ref 96–112)
Creatinine, Ser: 0.89 mg/dL (ref 0.40–1.20)
GFR: 57.47 mL/min — ABNORMAL LOW (ref >60.00)
Glucose, Bld: 93 mg/dL (ref 70–99)
Potassium: 4 meq/L (ref 3.5–5.1)
Sodium: 130 meq/L — ABNORMAL LOW (ref 135–145)

## 2023-12-13 LAB — TSH: TSH: 1.54 u[IU]/mL (ref 0.35–5.50)

## 2023-12-13 LAB — BRAIN NATRIURETIC PEPTIDE: Pro B Natriuretic peptide (BNP): 216 pg/mL — ABNORMAL HIGH (ref 0.0–100.0)

## 2023-12-13 LAB — SEDIMENTATION RATE: Sed Rate: 18 mm/h (ref 0–30)

## 2023-12-13 MED ORDER — LEVOFLOXACIN 500 MG PO TABS: 500.0000 mg | ORAL_TABLET | Freq: Every day | ORAL | 0 refills | Status: AC

## 2023-12-13 NOTE — Patient Instructions (Signed)
 Levaquin  500 mg one daily x 10 days  Please remember to go to the  x-ray department  for your tests - we will call you with the results when they are available    Please remember to go to the lab  department  for your tests - we will call you with the results when they are available.  Follow up is as needed

## 2023-12-14 DIAGNOSIS — K5790 Diverticulosis of intestine, part unspecified, without perforation or abscess without bleeding: Secondary | ICD-10-CM | POA: Diagnosis not present

## 2023-12-14 DIAGNOSIS — R1012 Left upper quadrant pain: Secondary | ICD-10-CM | POA: Diagnosis not present

## 2023-12-14 DIAGNOSIS — R058 Other specified cough: Secondary | ICD-10-CM | POA: Diagnosis not present

## 2023-12-14 DIAGNOSIS — K5900 Constipation, unspecified: Secondary | ICD-10-CM | POA: Diagnosis not present

## 2023-12-14 DIAGNOSIS — R1032 Left lower quadrant pain: Secondary | ICD-10-CM | POA: Diagnosis not present

## 2023-12-14 NOTE — Progress Notes (Signed)
 Spoke with pt regarding her labs, she confirmed her understanding NFN

## 2023-12-16 NOTE — Assessment & Plan Note (Signed)
 Onset ? Age 89 wit bronchiectasis first confirmed 2007  - Alpha one Screen  August 18, 2010 = MM  - See CT Chest 09/29/05 Stable bronchiectasis in right middle lobe and lingula.  Scattered tree in bud opacities throughout the lungs, right greater than left,  nonspecific.   - PFT's 06/21/2012 1.67 (91%) ratio 64 and no change,  DLCO 77% - Flutter valve added 04/27/2014  - PFTs 05/24/15   FEV1 1.55 (81%) ratio 69 p 6% improvement from saba and dlco 62% ratio 88%  - HRCT 04/04/16 >>> 1. 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 2. An incidental finding of potential clinical significance has been found. Dominant nodule in the right upper lobe, likely infectious/inflammatory in etiology.  - Zmax  250 mg daily 03/01/16 - 05/01/16 improved cough at d/c but diarrhea while on it and no change in nodule RUL > d/c 07/07/16 - 08/28/2017 added back cycles of zpak prn flare of purulent bronchitis > d/c 05/20/2018  - levaquin  500 x 10 days prn 06/11/2018 - flutter valve training 06/24/18 Chest CT 09/09/2018 Increased tree-in-bud nodularity in right lower lobe since 2016 -10/11/2020  new L lung mid nodule noted and likely inflammatory  - 01/17/2021    Am sputum samples>>>not done as of 08/01/2021 and cxr worse so rec BAL but get HRCT 1st  - 08/09/21  HRCT minimal change since 2017 so no BAL indicated   No specific findings to explain her recent decline but sats are fine and sputum is not purulent nor is there any obvious progression on cxr but she is convinced improves with levaquin  and only levaquin  so rx x 10 days empirically (advsed re tendonitis risk) but hold furhter abx or w/u  for now   F/u with PCP for general health eval and here prn          Each maintenance medication was reviewed in detail including emphasizing most importantly the difference between maintenance and  prns and under what circumstances the prns are to be triggered using an action plan format where appropriate.  Total time for H and P, chart review, counseling, reviewing flutter valve device(s) and generating customized AVS unique to this office visit / same day charting = 31 min

## 2023-12-19 NOTE — Progress Notes (Signed)
 Carelink Summary Report / Loop Recorder

## 2023-12-21 DIAGNOSIS — I10 Essential (primary) hypertension: Secondary | ICD-10-CM | POA: Diagnosis not present

## 2023-12-21 DIAGNOSIS — I480 Paroxysmal atrial fibrillation: Secondary | ICD-10-CM | POA: Diagnosis not present

## 2023-12-21 DIAGNOSIS — I4811 Longstanding persistent atrial fibrillation: Secondary | ICD-10-CM | POA: Diagnosis not present

## 2024-01-03 ENCOUNTER — Ambulatory Visit (INDEPENDENT_AMBULATORY_CARE_PROVIDER_SITE_OTHER)

## 2024-01-03 DIAGNOSIS — R55 Syncope and collapse: Secondary | ICD-10-CM

## 2024-01-03 DIAGNOSIS — I480 Paroxysmal atrial fibrillation: Secondary | ICD-10-CM | POA: Diagnosis not present

## 2024-01-03 DIAGNOSIS — I10 Essential (primary) hypertension: Secondary | ICD-10-CM | POA: Diagnosis not present

## 2024-01-03 DIAGNOSIS — I4811 Longstanding persistent atrial fibrillation: Secondary | ICD-10-CM | POA: Diagnosis not present

## 2024-01-03 LAB — CUP PACEART REMOTE DEVICE CHECK
Date Time Interrogation Session: 20250730230545
Implantable Pulse Generator Implant Date: 20210824

## 2024-01-04 ENCOUNTER — Ambulatory Visit: Payer: Self-pay | Admitting: Cardiology

## 2024-01-20 DIAGNOSIS — I4811 Longstanding persistent atrial fibrillation: Secondary | ICD-10-CM | POA: Diagnosis not present

## 2024-01-20 DIAGNOSIS — I480 Paroxysmal atrial fibrillation: Secondary | ICD-10-CM | POA: Diagnosis not present

## 2024-01-20 DIAGNOSIS — I10 Essential (primary) hypertension: Secondary | ICD-10-CM | POA: Diagnosis not present

## 2024-01-27 NOTE — Progress Notes (Unsigned)
 Cardiology Office Note Date:  01/27/2024  Patient ID:  [name], [name] 06-Apr-1933, MRN [id number] PCP:  Lanis Thresa BROCKS, PA-C  Electrophysiologist: Dr. Inocencio    Chief Complaint:   *** 6 mo  History of Present Illness: [name] is a 89 y.o. female with history of HTN, PAFib/SVT, chronic bronchitis  Admitted 07/01/22 after a syncopal event, had not eaten much that day, had soe elevated HRs and took her diltiazem , started to feel dizzy, ultimately had a syncopal event in the BR Loop interrogated, noted that she had been having multiple episodes of SVTs though none of late, started on Flecainide . Unclear cause of her syncope, though treated for diverticultis Discharged 07/02/22  I saw her 08/01/22 She is feeling fairly terrible She has self stopped cost of her medicines, including the flecainide  and xarelto . She reports the antibiotics made her feel awful, terrible GI side effects, though did complete them She reports she has had near syncope in the past and both times associated with times of infection. She is now on Cipro for a UTI.  The antibiotic making all of her joints ache.  This is how she feels from Cipro having been on it before, but told that it was the antibiotic needed to clear her infection so she is struggling through it. She really does not want to consider another medication. No CP, no palpitations or cardiac awareness, no SOB No near syncope or syncope. She had no bleeding trouble and no particular side effects with Xarelto , but stopped it because she was feeling so bad overall. She doesn't think she has had any AFib, generally can tell and feels like the PRN diltiazem  works well in settling it  We discussed at length rational for [facility], she remained unagreeable to resume. Advised diltiazem  for some RVR she also was unagreeable to this. Planned to see her once she was off antibiotics, hopefully feeling better with resolution of UTI and revisit meds  I  saw her 11/23/22 She never really feels well, but does feel better, very much attributes that to being off the medications and completing the antibiotics She does not think she had had any Afib No CP, palpitations No near syncope or syncope. Chronic cough with her bronchiectasis Remained off AAD and OAC, did not want to resume Noted to have some PVCs with her ILR interrogation and PVC counter turned on to assess burden  I saw her 01/26/23 She is alone today Still just not feeling well. Goes back to January, reminisces on how terrible she felt, and how the antibiotics nearly killed her, and she has just never felt well again. No CP, palpitations. She is tired all of the time She is not taking metoprolol  on her list, never started it Does have PRN dilt but rarely feels the need to take it She feels a constant sense of low grade dizziness, no energy. 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 No near syncope or syncope. She requests labs today to see if that may explain her ongoing fatigue Planned for echo Advised f/u with her PMD  Echo labs looked good, nothing to explain her fatigue  I saw her 04/17/23 She feels like she may be feeling a bit better Again, reminisces about early in the year and the antibiotics that made her feel so awful Feels a bit defeated, pulmonology team no longer feels they have much to add to benefit and advised she see another specialist in [facility] No near syncope or  syncope Occasionally lightheaded Reports BP at home is 140/70 generally A recheck on her BP remains 160/70 She does not want any medications for her BP No CP Baseline cough/SOB Low AFib/PVC burden No changes were made  I saw her 07/31/23 Never really feels good unfortunately. Her chronic bronchitis is bothersome Chronic neck/R scapular pain Chronic dizziness, poor gait stability.  Denies falls No CP No near syncope or syncope Primary team has discussed a BP medication,  but not started it yet BP deferred to her PMD/team  TODAY  *** AFib, PVC burden *** HR histogram >> symptoms *** WC next given time  Device information MDT LINQ II, implanted 01/27/2020   Past Medical History:  Diagnosis Date   Arm numbness left    Back pain    BCC (basal cell carcinoma of skin) 03/16/1986   Left Shoulder (curet and excision)   BCC (basal cell carcinoma of skin) 03/10/1988   Mid Back (tx p bx)   BCC (basal cell carcinoma of skin) 01/28/1998   Upper Lip (curet and excision)   BCC (basal cell carcinoma of skin) 10/24/2005   Right Mid Back (curet and 5FU)   Bronchitis, chronic (HCC)    Depression    Dizziness    Fatigue    Hypertension    mild   Obstructive bronchiectasis (HCC) 08/28/2007    Followed in Pulmonary clinic/ [facility]/ [place]  Onset ? Age 60 wit bronchiectasis first confirmed 2007  - Alpha one Screen  August 18, 2010 = MM  - See CT Chest 09/29/05 Stable bronchiectasis in right middle lobe and lingula.  Scattered tree in bud opacities throughout the lungs, right greater than left,  nonspecific.   - PFT's 06/21/2012 1.67 (91%) ratio 64 and no change,  DLCO 77% - Flutte   Palpitations    SCCA (squamous cell carcinoma) of skin 05/08/2011   Left Hand (in situ) (tx p bx)   Skin cancer    Stress    Superficial basal cell carcinoma (BCC) 07/11/1995   Top Right Upper Leg (curet and 5FU)   Superficial basal cell carcinoma (BCC) 01/28/1998   Right Back, Upper Thigh (curet and cautery)   Superficial basal cell carcinoma (BCC) 02/18/2002   Upper Inner Right Leg (curet and cautery)   Superficial basal cell carcinoma (BCC) 02/18/2002   Lower Abdomen (curet and 5FU)   Superficial basal cell carcinoma (BCC) 01/27/2013   Right Inner Thigh (tx p bx)   Syncope    History of     Past Surgical History:  Procedure Laterality Date   APPENDECTOMY     BIOPSY  10/18/2022   Procedure: BIOPSY;  Surgeon: Dianna Specking, MD;  Location: [facility];  Service:  Gastroenterology;;   CESAREAN SECTION  [phone]   COLONOSCOPY WITH PROPOFOL  N/A 10/18/2022   Procedure: COLONOSCOPY WITH PROPOFOL ;  Surgeon: Dianna Specking, MD;  Location: [facility];  Service: Gastroenterology;  Laterality: N/A;   POLYPECTOMY  10/18/2022   Procedure: POLYPECTOMY;  Surgeon: Dianna Specking, MD;  Location: [facility];  Service: Gastroenterology;;    Current Outpatient Medications  Medication Sig Dispense Refill   acetaminophen  (TYLENOL ) 500 MG tablet Take 500 mg by mouth every 6 (six) hours as needed.     fluticasone  (FLONASE ) 50 MCG/ACT nasal spray Place 1 spray into both nostrils daily. 16 g 2   HYDROcodone -acetaminophen  (NORCO/VICODIN) 5-325 MG tablet Take 1 tablet by mouth 2 (two) times daily as needed for severe pain.     levofloxacin  (LEVAQUIN ) 500 MG tablet Take  1 tablet (500 mg total) by mouth daily. 10 tablet 0   Misc. Devices MISC by Does not apply route. Flutter valve when needed for cough     omeprazole  (PRILOSEC ) 40 MG capsule 1 capsule 1/2 to 1 hour before morning meal Orally Once a day; Duration: 30 days     No current facility-administered medications for this visit.    Allergies:   Hydralazine  hcl, Ciprofloxacin, Penicillins, Sulfonamide derivatives, Apixaban , Avelox [moxifloxacin], Band-aid infection defense [bacitracin-polymyxin b], Dome-paste bandage [wound dressings], Mannitol, Multaq  [dronedarone ], Nitrofurantoin monohyd macro, Other, Reclast  [zoledronic  acid], Sulfa antibiotics, Chlorpheniramine, and Prednisone    Social History:  The patient  reports that she has never smoked. She has never used smokeless tobacco. She reports current alcohol use. She reports that she does not use drugs.   Family History:  The patient's family history includes Alcohol abuse in her father; Atrial fibrillation in her mother; Thyroid  disease in her mother.  ROS:  Please see the history of present illness.    All other systems are reviewed and otherwise  negative.   PHYSICAL EXAM:  VS:  There were no vitals taken for this visit. BMI: There is no height or weight on file to calculate BMI. Well nourished, thin body habitus, extremely well dressed, well developed, in no acute distress HEENT: normocephalic, atraumatic Neck: no JVD, carotid bruits or masses Cardiac: *** RRR; no significant murmurs, no rubs, or gallops Lungs: *** CTA b/l, no wheezing, rhonchi or rales Abd: soft, nontender MS: no deformity, age appropriate atrophy Ext: *** no edema Skin: warm and dry, no rash Neuro:  No gross deficits appreciated Psych: euthymic mood, full affect  *** ILR site is stable   EKG: not done today  Device interrogation transmission today and reviewed by myself  *** Battery is good ***    02/09/23: TTE 1. Left ventricular ejection fraction, by estimation, is 60 to 65%. The  left ventricle has normal function. The left ventricle has no regional  wall motion abnormalities. Left ventricular diastolic parameters are  consistent with Grade I diastolic  dysfunction (impaired relaxation).   2. Right ventricular systolic function is normal. The right ventricular  size is normal. There is normal pulmonary artery systolic pressure. The  estimated right ventricular systolic pressure is 27.0 mmHg.   3. Right atrial size was mildly dilated.   4. The mitral valve is normal in structure. Trivial mitral valve  regurgitation. No evidence of mitral stenosis.   5. The aortic valve is normal in structure. Aortic valve regurgitation is  moderate. Aortic valve sclerosis/calcification is present, without any  evidence of aortic stenosis. Aortic regurgitation PHT measures 396 msec.   6. The inferior vena cava is normal in size with greater than 50%  respiratory variability, suggesting right atrial pressure of 3 mmHg.     ECHO: 10/30/2021  1. Left ventricular ejection fraction, by estimation, is 60 to 65%. The  left ventricle has normal function. The left  ventricle has no regional  wall motion abnormalities. Left ventricular diastolic parameters were  normal.   2. Right ventricular systolic function is normal. The right ventricular  size is normal. There is normal pulmonary artery systolic pressure.   3. The pericardial effusion is anterior to the right ventricle.   4. The mitral valve is normal in structure. No evidence of mitral valve  regurgitation. No evidence of mitral stenosis.   5. The aortic valve is tricuspid. There is mild calcification of the  aortic valve. Aortic valve regurgitation is mild.  Aortic valve sclerosis  is present, with no evidence of aortic valve stenosis.   6. The inferior vena cava is dilated in size with >50% respiratory  variability, suggesting right atrial pressure of 8 mmHg.   Recent Labs: 12/13/2023: BUN 13; Creatinine, Ser 0.89; Hemoglobin 14.4; Platelets 318.0; Potassium 4.0; Pro B Natriuretic peptide (BNP) 216.0; Sodium 130; TSH 1.54  No results found for requested labs within last 365 days.   CrCl cannot be calculated (Patient's most recent lab result is older than the maximum 21 days allowed.).   Wt Readings from Last 3 Encounters:  12/13/23 121 lb 6.4 oz (55.1 kg)  07/31/23 121 lb 12.8 oz (55.2 kg)  06/25/23 121 lb 12.8 oz (55.2 kg)     Other studies reviewed: Additional studies/records reviewed today include: summarized above  ASSESSMENT AND PLAN:  Paroxysmal Afib CHA2DS2Vasc is 4, self stopped xarelto  Self stopped flecainide  *** %burden   *** We have had a long discussions in the past on rational for OAC and her risk. She does not want to resume meds Understands stroke risk  *** She does not want any rate or BP control medications, extremely weary of all medications and side effects   HTN *** She is managing BP with her primary team   3. PVC No symptoms Low burden, *** % burden Doubt source for her fatigue  4. Fatigue ***Chronic and perhaps improving  5. Secondary  hypercoagulable state   Disposition:  back in *** mo, sooner if needed     Current medicines are reviewed at length with the patient today.  The patient did not have any concerns regarding medicines.  Bonney Charlies Arthur, PA-C 01/27/2024 11:43 AM     [facility] [address] [place] [address] [phone] (office)  [phone] (fax)

## 2024-01-29 ENCOUNTER — Ambulatory Visit: Attending: Physician Assistant | Admitting: Physician Assistant

## 2024-01-29 VITALS — BP 138/90 | HR 64 | Ht 64.0 in | Wt 119.0 lb

## 2024-01-29 DIAGNOSIS — D6869 Other thrombophilia: Secondary | ICD-10-CM | POA: Diagnosis not present

## 2024-01-29 DIAGNOSIS — Z9889 Other specified postprocedural states: Secondary | ICD-10-CM | POA: Insufficient documentation

## 2024-01-29 DIAGNOSIS — R42 Dizziness and giddiness: Secondary | ICD-10-CM | POA: Insufficient documentation

## 2024-01-29 DIAGNOSIS — I480 Paroxysmal atrial fibrillation: Secondary | ICD-10-CM | POA: Diagnosis not present

## 2024-01-29 DIAGNOSIS — I493 Ventricular premature depolarization: Secondary | ICD-10-CM | POA: Insufficient documentation

## 2024-01-29 DIAGNOSIS — I10 Essential (primary) hypertension: Secondary | ICD-10-CM | POA: Diagnosis not present

## 2024-01-29 NOTE — Patient Instructions (Signed)
 Medication Instructions:  Your physician recommends that you continue on your current medications as directed. Please refer to the Current Medication list given to you today. *If you need a refill on your cardiac medications before your next appointment, please call your pharmacy*  Lab Work: NONE ORDERED If you have labs (blood work) drawn today and your tests are completely normal, you will receive your results only by: MyChart Message (if you have MyChart) OR A paper copy in the mail If you have any lab test that is abnormal or we need to change your treatment, we will call you to review the results.  Testing/Procedures: NONE ORDERED  Follow-Up: At [facility], you and your health needs are our priority.  As part of our continuing mission to provide you with exceptional heart care, our providers are all part of one team.  This team includes your primary Cardiologist (physician) and Advanced Practice Providers or APPs (Physician Assistants and Nurse Practitioners) who all work together to provide you with the care you need, when you need it.  Your next appointment:   6 month(s)  Provider:   You may see Will Gladis Norton, MD or one of the following Advanced Practice Providers on your designated Care Team:   Charlies Arthur, PA-C Michael Andy Tillery, PA-C Suzann Riddle, NP Daphne Barrack, NP  We recommend signing up for the patient portal called MyChart.  Sign up information is provided on this After Visit Summary.  MyChart is used to connect with patients for Virtual Visits (Telemedicine).  Patients are able to view lab/test results, encounter notes, upcoming appointments, etc.  Non-urgent messages can be sent to your provider as well.   To learn more about what you can do with MyChart, go to [email or web address].   Other Instructions You have been referred to NEUROLOGY

## 2024-01-30 ENCOUNTER — Encounter: Payer: Self-pay | Admitting: Neurology

## 2024-02-03 DIAGNOSIS — I4811 Longstanding persistent atrial fibrillation: Secondary | ICD-10-CM | POA: Diagnosis not present

## 2024-02-03 DIAGNOSIS — I10 Essential (primary) hypertension: Secondary | ICD-10-CM | POA: Diagnosis not present

## 2024-02-03 DIAGNOSIS — I480 Paroxysmal atrial fibrillation: Secondary | ICD-10-CM | POA: Diagnosis not present

## 2024-02-04 ENCOUNTER — Ambulatory Visit

## 2024-02-04 DIAGNOSIS — R55 Syncope and collapse: Secondary | ICD-10-CM | POA: Diagnosis not present

## 2024-02-06 LAB — CUP PACEART REMOTE DEVICE CHECK
Date Time Interrogation Session: 20250830230311
Implantable Pulse Generator Implant Date: 20210824

## 2024-02-08 ENCOUNTER — Ambulatory Visit: Payer: Self-pay | Admitting: Cardiology

## 2024-02-12 NOTE — Progress Notes (Signed)
 Remote Loop Recorder Transmission

## 2024-02-19 DIAGNOSIS — I10 Essential (primary) hypertension: Secondary | ICD-10-CM | POA: Diagnosis not present

## 2024-02-19 DIAGNOSIS — I4811 Longstanding persistent atrial fibrillation: Secondary | ICD-10-CM | POA: Diagnosis not present

## 2024-02-19 DIAGNOSIS — I480 Paroxysmal atrial fibrillation: Secondary | ICD-10-CM | POA: Diagnosis not present

## 2024-02-26 DIAGNOSIS — H04123 Dry eye syndrome of bilateral lacrimal glands: Secondary | ICD-10-CM | POA: Diagnosis not present

## 2024-02-26 DIAGNOSIS — Z961 Presence of intraocular lens: Secondary | ICD-10-CM | POA: Diagnosis not present

## 2024-02-26 DIAGNOSIS — H5713 Ocular pain, bilateral: Secondary | ICD-10-CM | POA: Diagnosis not present

## 2024-03-04 DIAGNOSIS — L2989 Other pruritus: Secondary | ICD-10-CM | POA: Diagnosis not present

## 2024-03-04 DIAGNOSIS — L720 Epidermal cyst: Secondary | ICD-10-CM | POA: Diagnosis not present

## 2024-03-04 DIAGNOSIS — L814 Other melanin hyperpigmentation: Secondary | ICD-10-CM | POA: Diagnosis not present

## 2024-03-04 DIAGNOSIS — L538 Other specified erythematous conditions: Secondary | ICD-10-CM | POA: Diagnosis not present

## 2024-03-04 DIAGNOSIS — D485 Neoplasm of uncertain behavior of skin: Secondary | ICD-10-CM | POA: Diagnosis not present

## 2024-03-04 DIAGNOSIS — D1801 Hemangioma of skin and subcutaneous tissue: Secondary | ICD-10-CM | POA: Diagnosis not present

## 2024-03-04 DIAGNOSIS — L820 Inflamed seborrheic keratosis: Secondary | ICD-10-CM | POA: Diagnosis not present

## 2024-03-04 DIAGNOSIS — Z85828 Personal history of other malignant neoplasm of skin: Secondary | ICD-10-CM | POA: Diagnosis not present

## 2024-03-04 DIAGNOSIS — I480 Paroxysmal atrial fibrillation: Secondary | ICD-10-CM | POA: Diagnosis not present

## 2024-03-04 DIAGNOSIS — R208 Other disturbances of skin sensation: Secondary | ICD-10-CM | POA: Diagnosis not present

## 2024-03-04 DIAGNOSIS — Z789 Other specified health status: Secondary | ICD-10-CM | POA: Diagnosis not present

## 2024-03-04 DIAGNOSIS — I10 Essential (primary) hypertension: Secondary | ICD-10-CM | POA: Diagnosis not present

## 2024-03-04 DIAGNOSIS — Z08 Encounter for follow-up examination after completed treatment for malignant neoplasm: Secondary | ICD-10-CM | POA: Diagnosis not present

## 2024-03-04 DIAGNOSIS — L821 Other seborrheic keratosis: Secondary | ICD-10-CM | POA: Diagnosis not present

## 2024-03-04 DIAGNOSIS — I4811 Longstanding persistent atrial fibrillation: Secondary | ICD-10-CM | POA: Diagnosis not present

## 2024-03-04 NOTE — Progress Notes (Signed)
 Remote Loop Recorder Transmission

## 2024-03-05 NOTE — Progress Notes (Signed)
 Remote Loop Recorder Transmission

## 2024-03-06 ENCOUNTER — Ambulatory Visit

## 2024-03-06 ENCOUNTER — Ambulatory Visit: Payer: Self-pay | Admitting: Cardiology

## 2024-03-06 DIAGNOSIS — R55 Syncope and collapse: Secondary | ICD-10-CM | POA: Diagnosis not present

## 2024-03-06 LAB — CUP PACEART REMOTE DEVICE CHECK
Date Time Interrogation Session: 20251001230347
Implantable Pulse Generator Implant Date: 20210824

## 2024-03-10 NOTE — Progress Notes (Signed)
 Remote Loop Recorder Transmission

## 2024-03-20 DIAGNOSIS — I10 Essential (primary) hypertension: Secondary | ICD-10-CM | POA: Diagnosis not present

## 2024-03-20 DIAGNOSIS — I480 Paroxysmal atrial fibrillation: Secondary | ICD-10-CM | POA: Diagnosis not present

## 2024-03-20 DIAGNOSIS — I4811 Longstanding persistent atrial fibrillation: Secondary | ICD-10-CM | POA: Diagnosis not present

## 2024-04-04 DIAGNOSIS — I480 Paroxysmal atrial fibrillation: Secondary | ICD-10-CM | POA: Diagnosis not present

## 2024-04-04 DIAGNOSIS — I4811 Longstanding persistent atrial fibrillation: Secondary | ICD-10-CM | POA: Diagnosis not present

## 2024-04-04 DIAGNOSIS — I10 Essential (primary) hypertension: Secondary | ICD-10-CM | POA: Diagnosis not present

## 2024-04-07 ENCOUNTER — Ambulatory Visit

## 2024-04-07 DIAGNOSIS — R55 Syncope and collapse: Secondary | ICD-10-CM | POA: Diagnosis not present

## 2024-04-07 LAB — CUP PACEART REMOTE DEVICE CHECK
Date Time Interrogation Session: 20251102230052
Implantable Pulse Generator Implant Date: 20210824

## 2024-04-08 ENCOUNTER — Emergency Department (HOSPITAL_COMMUNITY)

## 2024-04-08 ENCOUNTER — Ambulatory Visit: Payer: Self-pay | Admitting: Cardiology

## 2024-04-08 ENCOUNTER — Emergency Department (HOSPITAL_COMMUNITY)
Admission: EM | Admit: 2024-04-08 | Discharge: 2024-04-08 | Disposition: A | Discharge status: Home / Self Care | Attending: Emergency Medicine | Admitting: Emergency Medicine

## 2024-04-08 DIAGNOSIS — M5030 Other cervical disc degeneration, unspecified cervical region: Secondary | ICD-10-CM | POA: Insufficient documentation

## 2024-04-08 DIAGNOSIS — S0990XA Unspecified injury of head, initial encounter: Secondary | ICD-10-CM | POA: Diagnosis present

## 2024-04-08 DIAGNOSIS — S4991XA Unspecified injury of right shoulder and upper arm, initial encounter: Secondary | ICD-10-CM | POA: Diagnosis present

## 2024-04-08 DIAGNOSIS — I10 Essential (primary) hypertension: Secondary | ICD-10-CM | POA: Diagnosis not present

## 2024-04-08 DIAGNOSIS — Z043 Encounter for examination and observation following other accident: Secondary | ICD-10-CM | POA: Diagnosis not present

## 2024-04-08 DIAGNOSIS — W07XXXA Fall from chair, initial encounter: Secondary | ICD-10-CM | POA: Diagnosis not present

## 2024-04-08 DIAGNOSIS — R9082 White matter disease, unspecified: Secondary | ICD-10-CM | POA: Diagnosis not present

## 2024-04-08 DIAGNOSIS — I6789 Other cerebrovascular disease: Secondary | ICD-10-CM | POA: Diagnosis not present

## 2024-04-08 DIAGNOSIS — M47812 Spondylosis without myelopathy or radiculopathy, cervical region: Secondary | ICD-10-CM | POA: Diagnosis not present

## 2024-04-08 DIAGNOSIS — S42021A Displaced fracture of shaft of right clavicle, initial encounter for closed fracture: Secondary | ICD-10-CM | POA: Insufficient documentation

## 2024-04-08 DIAGNOSIS — S42001A Fracture of unspecified part of right clavicle, initial encounter for closed fracture: Secondary | ICD-10-CM | POA: Diagnosis not present

## 2024-04-08 DIAGNOSIS — S199XXA Unspecified injury of neck, initial encounter: Secondary | ICD-10-CM | POA: Diagnosis not present

## 2024-04-08 DIAGNOSIS — W19XXXA Unspecified fall, initial encounter: Secondary | ICD-10-CM

## 2024-04-08 LAB — CBC WITH DIFFERENTIAL/PLATELET
Abs Immature Granulocytes: 0.08 K/uL — ABNORMAL HIGH (ref 0.00–0.07)
Basophils Absolute: 0.1 K/uL (ref 0.0–0.1)
Basophils Relative: 1 %
Eosinophils Absolute: 0.1 K/uL (ref 0.0–0.5)
Eosinophils Relative: 1 %
HCT: 44 % (ref 36.0–46.0)
Hemoglobin: 14.4 g/dL (ref 12.0–15.0)
Immature Granulocytes: 1 %
Lymphocytes Relative: 8 %
Lymphs Abs: 0.8 K/uL (ref 0.7–4.0)
MCH: 29.8 pg (ref 26.0–34.0)
MCHC: 32.7 g/dL (ref 30.0–36.0)
MCV: 91.1 fL (ref 80.0–100.0)
Monocytes Absolute: 0.4 K/uL (ref 0.1–1.0)
Monocytes Relative: 4 %
Neutro Abs: 8.7 K/uL — ABNORMAL HIGH (ref 1.7–7.7)
Neutrophils Relative %: 85 %
Platelets: 315 K/uL (ref 150–400)
RBC: 4.83 MIL/uL (ref 3.87–5.11)
RDW: 13 % (ref 11.5–15.5)
WBC: 10.1 K/uL (ref 4.0–10.5)
nRBC: 0 % (ref 0.0–0.2)

## 2024-04-08 LAB — BASIC METABOLIC PANEL WITH GFR
Anion gap: 11 (ref 5–15)
BUN: 10 mg/dL (ref 8–23)
CO2: 26 mmol/L (ref 22–32)
Calcium: 10 mg/dL (ref 8.9–10.3)
Chloride: 98 mmol/L (ref 98–111)
Creatinine, Ser: 0.78 mg/dL (ref 0.44–1.00)
GFR, Estimated: >60 mL/min (ref >60)
Glucose, Bld: 100 mg/dL — ABNORMAL HIGH (ref 70–99)
Potassium: 3.8 mmol/L (ref 3.5–5.1)
Sodium: 135 mmol/L (ref 135–145)

## 2024-04-08 MED ORDER — OXYCODONE-ACETAMINOPHEN 5-325 MG PO TABS: 1.0000 | ORAL_TABLET | Freq: Three times a day (TID) | ORAL | 0 refills | Status: AC | PRN

## 2024-04-08 MED ORDER — MORPHINE SULFATE (PF) 4 MG/ML IV SOLN
4.0000 mg | Freq: Once | INTRAVENOUS | Status: AC
  Administered 2024-04-08: 4 mg via INTRAVENOUS
  Filled 2024-04-08: qty 1

## 2024-04-08 MED ORDER — ONDANSETRON HCL 4 MG/2ML IJ SOLN
4.0000 mg | Freq: Once | INTRAMUSCULAR | Status: AC
  Administered 2024-04-08: 4 mg via INTRAVENOUS
  Filled 2024-04-08: qty 2

## 2024-04-08 NOTE — Discharge Instructions (Addendum)
 [name] was seen in the emerged ferment after a fall She has a close fracture of the right clavicle (collarbone) there is nothing to do for this tonight but it is important that she follows up with orthopedics in the office Remain in the sling until cleared by orthopedics but remember to stretch oute the arm hand and elbow a few times a day We have called in a prescription for Percocet for you to pick up from your pharmacy begin taking as directed for severe pain only Do not drink alcohol or drive while taking Percocet Be aware this medication can make you dizzy and more likely for you to fall again Take Tylenol  or Motrin  as directed for mild to moderate pain Return to the emergency room for severe pain or any other concerns

## 2024-04-08 NOTE — ED Triage Notes (Signed)
 BIB EMS fell asleep at dinner table, fell forward and has deformity to right clavicle area, due to hitting table. 172/88-104-99% RA -20

## 2024-04-08 NOTE — ED Provider Notes (Signed)
 [place] EMERGENCY DEPARTMENT AT [facility] Provider Note   CSN: [id number] Arrival date & time: 04/08/24  1827     Patient presents with: [name] is a 89 y.o. female.  With a history of hypertension and syncope presents to the ED after fall.  Patient fell asleep at the dinner table earlier tonight around 1730.  She fell out of her chair and landed on her right side.  Now with severe pain over the right collarbone.  Positive head strike no LOC.  Denies neck pain back pain pain in other extremities.  No anticoagulation    Fall       Prior to Admission medications   Medication Sig Start Date End Date Taking? Authorizing Provider  oxyCODONE -acetaminophen  (PERCOCET/ROXICET) 5-325 MG tablet Take 1 tablet by mouth every 8 (eight) hours as needed for up to 5 days for severe pain (pain score 7-10). 04/08/24 04/13/24 Yes Pamella Ozell LABOR, DO  acetaminophen  (TYLENOL ) 500 MG tablet Take 500 mg by mouth every 6 (six) hours as needed.    [provider]  fluticasone  (FLONASE ) 50 MCG/ACT nasal spray Place 1 spray into both nostrils daily. 06/25/23   Hunsucker, Donnice SAUNDERS, MD  HYDROcodone -acetaminophen  (NORCO/VICODIN) 5-325 MG tablet Take 1 tablet by mouth 2 (two) times daily as needed for severe pain. 04/07/22   [provider]  levofloxacin  (LEVAQUIN ) 500 MG tablet Take 1 tablet (500 mg total) by mouth daily. 12/13/23   Darlean Ozell NOVAK, MD  Misc. Devices MISC by Does not apply route. Flutter valve when needed for cough    [provider]  omeprazole  (PRILOSEC ) 40 MG capsule 1 capsule 1/2 to 1 hour before morning meal Orally Once a day; Duration: 30 days 11/02/23   [provider]    Allergies: Hydralazine  hcl, Avelox [moxifloxacin], Ciprofloxacin, Nitrofurantoin monohyd macro, Other, Penicillins, Sulfonamide derivatives, Apixaban , Band-aid infection defense [bacitracin-polymyxin b], Chlorpheniramine, Dome-paste bandage [wound dressings],  Mannitol, Multaq  [dronedarone ], Prednisone , Reclast  [zoledronic  acid], and Sulfa antibiotics    Review of Systems  Updated Vital Signs BP (!) 205/97   Temp 98.1 F (36.7 C) (Oral)   Ht 5' 4 (1.626 m)   Wt 54 kg   SpO2 97%   BMI 20.43 kg/m   Physical Exam Vitals and nursing note reviewed.  HENT:     Head: Normocephalic and atraumatic.  Eyes:     Pupils: Pupils are equal, round, and reactive to light.  Cardiovascular:     Rate and Rhythm: Normal rate and regular rhythm.  Pulmonary:     Effort: Pulmonary effort is normal.     Breath sounds: Normal breath sounds.  Abdominal:     Palpations: Abdomen is soft.     Tenderness: There is no abdominal tenderness.  Musculoskeletal:     Cervical back: Neck supple. No tenderness.     Comments: Large area of ecchymosis over midshaft right clavicle with exquisite tenderness to palpation of the clavicle Limited range of motion of right shoulder joint secondary to pain Full active range of motion of right elbow right hand right wrist with sensation intact to touch throughout 2+ radial pulses bilaterally No midline tenderness step-off deformity of back  Skin:    General: Skin is warm and dry.  Neurological:     Mental Status: She is alert.  Psychiatric:        Mood and Affect: Mood normal.     (all labs ordered are listed, but only abnormal results are displayed) Labs Reviewed  BASIC METABOLIC PANEL WITH GFR - Abnormal; Notable for the following components:      Result Value   Glucose, Bld 100 (*)    All other components within normal limits  CBC WITH DIFFERENTIAL/PLATELET - Abnormal; Notable for the following components:   Neutro Abs 8.7 (*)    Abs Immature Granulocytes 0.08 (*)    All other components within normal limits    EKG: None  Radiology: DG Shoulder Right Result Date: 04/08/2024 EXAM: 1 VIEW XRAY OF THE RIGHT SHOULDER 04/08/2024 08:22:19 PM COMPARISON: Unchanged from prior study. CLINICAL HISTORY: fall fall  FINDINGS: BONES AND JOINTS: Glenohumeral joint is normally aligned. Displaced mid right clavicular fracture is unchanged from prior. No additional fractures of the shoulder. The [facility] joint is unremarkable in appearance. SOFT TISSUES: No abnormal calcifications. Visualized lung is unremarkable. IMPRESSION: 1. Displaced mid right clavicular fracture, unchanged from prior. 2. No additional fractures of the shoulder. Electronically signed by: Greig Pique MD 04/08/2024 08:41 PM EST RP Workstation: [id number]   DG Humerus Right Result Date: 04/08/2024 EXAM: 1 VIEW(S) XRAY OF THE RIGHT HUMERUS 04/08/2024 08:22:19 PM COMPARISON: None available. CLINICAL HISTORY: fall, fall FINDINGS: BONES AND JOINTS: Displaced right clavicular fracture is again seen. Please see dedicated right clavicle x-ray report for further description. No acute fracture of the humerus. No focal osseous lesion. No joint dislocation. SOFT TISSUES: The soft tissues are unremarkable. IMPRESSION: 1. Displaced right clavicular fracture. Please see dedicated right clavicle x-ray report for further description. Electronically signed by: Greig Pique MD 04/08/2024 08:36 PM EST RP Workstation: [id number]   DG Clavicle Right Result Date: 04/08/2024 EXAM: 2 VIEW(S) XRAY OF THE RIGHT CLAVICLE COMPLETE 04/08/2024 08:22:19 PM COMPARISON: None available. CLINICAL HISTORY: fall fall FINDINGS: BONES: There is an acute fracture of the mid right clavicle. There is 1.5 cm of shaft overlap with inferior displacement of the distal fracture fragment. JOINTS: No joint dislocation. SOFT TISSUES: There is overlying soft tissue swelling. IMPRESSION: 1. Acute fracture of the mid right clavicle with 1.5 shaft and inferior displacement of the distal fracture fragment. No dislocation. 2. Overlying soft tissue swelling. Electronically signed by: Greig Pique MD 04/08/2024 08:34 PM EST RP Workstation: [id number]   CT Cervical Spine Wo Contrast Result Date: 04/08/2024 EXAM: CT  CERVICAL SPINE WITHOUT CONTRAST 04/08/2024 08:24:22 PM TECHNIQUE: CT of the cervical spine was performed without the administration of intravenous contrast. Multiplanar reformatted images are provided for review. Automated exposure control, iterative reconstruction, and/or weight based adjustment of the mA/kV was utilized to reduce the radiation dose to as low as reasonably achievable. COMPARISON: 05/12/2023 CLINICAL HISTORY: Neck trauma (Age >= 65y) FINDINGS: CERVICAL SPINE: BONES AND ALIGNMENT: No acute fracture or traumatic malalignment. DEGENERATIVE CHANGES: Mild degenerative disc and facet disease. SOFT TISSUES: No prevertebral soft tissue swelling. LUNGS: Nodularity noted throughout the right upper lobe similar to prior chest CT from 08/09/2021 most compatible with chronic/indolent infection such as MAI. IMPRESSION: 1. No acute abnormality of the cervical spine related to the reported neck trauma. 2. Mild degenerative disc and facet disease. Electronically signed by: Franky Crease MD 04/08/2024 08:33 PM EST RP Workstation: [id number]   CT Head Wo Contrast Result Date: 04/08/2024 EXAM: CT HEAD WITHOUT CONTRAST 04/08/2024 08:24:22 PM TECHNIQUE: CT of the head was performed without the administration of intravenous contrast. Automated exposure control, iterative reconstruction, and/or weight based adjustment of the mA/kV was utilized to reduce the radiation dose to as low as reasonably achievable. COMPARISON: 05/12/2023 CLINICAL HISTORY: Head trauma, minor (Age >= 65y). FINDINGS:  BRAIN AND VENTRICLES: No acute hemorrhage. No evidence of acute infarct. No hydrocephalus. No extra-axial collection. No mass effect or midline shift. There is atrophy and chronic small vessel disease throughout the deep white matter. ORBITS: No acute abnormality. SINUSES: No acute abnormality. SOFT TISSUES AND SKULL: No acute soft tissue abnormality. No skull fracture. IMPRESSION: 1. No acute intracranial abnormality. 2. Chronic small  vessel disease and cerebral atrophy. Electronically signed by: Franky Crease MD 04/08/2024 08:29 PM EST RP Workstation: [id number]   DG Chest Portable 1 View Result Date: 04/08/2024 EXAM: 1 VIEW(S) XRAY OF THE CHEST 04/08/2024 08:22:19 PM COMPARISON: 12/13/2023 CLINICAL HISTORY: fall FINDINGS: LINES, TUBES AND DEVICES: Loop recorder projects over the left chest. LUNGS AND PLEURA: Areas of scarring in the right upper lobe. No pulmonary edema. No pleural effusion. No pneumothorax. HEART AND MEDIASTINUM: No acute abnormality of the cardiac and mediastinal silhouettes. BONES AND SOFT TISSUES: No acute osseous abnormality. IMPRESSION: 1. No acute cardiopulmonary process. Electronically signed by: Franky Crease MD 04/08/2024 08:28 PM EST RP Workstation: [id number]   CUP PACEART REMOTE DEVICE CHECK Result Date: 04/07/2024 ILR summary report received. Battery status OK. Normal device function. No new symptom, tachy, brady, or pause episodes. 1 new AF episode, 7 hrs 30 min, AF burden 1%, no OAC, pt refused in the past. Monthly summary reports and ROV/PRN ML, CVRS    Procedures   Medications Ordered in the ED  morphine  (PF) 4 MG/ML injection 4 mg (4 mg Intravenous Given 04/08/24 2038)  ondansetron  (ZOFRAN ) injection 4 mg (4 mg Intravenous Given 04/08/24 2036)                                    Medical Decision Making 89 year old female with history as above presenting to the ED after fall.  Fell asleep at the dinner table and fell onto her right side.  Positive head trauma.  Pain localized over right clavicle with exquisite tenderness and large area of ecchymosis most concerning for right clavicular fracture.  CT head C-spine chest x-ray shoulder x-ray negative.  No fracture of the humerus.  Confirmed right clavicular fracture.  Placed in sling.  Discussed with orthopedics Dr.Swinteck who agrees with plan for sling, pain management follow-up in the office.  Will discharge with a prescription for short course  of opioid analgesia sling and orthopedic follow-up.  She will be discharged in the care of her son at home.  Declined offer for rehab/SNF placement at this time  Amount and/or Complexity of Data Reviewed Labs: ordered. Radiology: ordered.  Risk Prescription drug management.        Final diagnoses:  Fall, initial encounter  Closed displaced fracture of shaft of right clavicle, initial encounter    ED Discharge Orders          Ordered    oxyCODONE -acetaminophen  (PERCOCET/ROXICET) 5-325 MG tablet  Every 8 hours PRN        04/08/24 2154               Keelen Quevedo A, DO 04/08/24 2158

## 2024-04-09 DIAGNOSIS — W19XXXD Unspecified fall, subsequent encounter: Secondary | ICD-10-CM | POA: Diagnosis not present

## 2024-04-09 DIAGNOSIS — S42001A Fracture of unspecified part of right clavicle, initial encounter for closed fracture: Secondary | ICD-10-CM | POA: Diagnosis not present

## 2024-04-11 DIAGNOSIS — S42021A Displaced fracture of shaft of right clavicle, initial encounter for closed fracture: Secondary | ICD-10-CM | POA: Diagnosis not present

## 2024-04-11 NOTE — Progress Notes (Signed)
 Remote Loop Recorder Transmission

## 2024-04-18 DIAGNOSIS — S42021A Displaced fracture of shaft of right clavicle, initial encounter for closed fracture: Secondary | ICD-10-CM | POA: Diagnosis not present

## 2024-04-21 ENCOUNTER — Ambulatory Visit: Admitting: Neurology

## 2024-04-23 ENCOUNTER — Telehealth: Payer: Self-pay | Admitting: Cardiology

## 2024-04-23 NOTE — Telephone Encounter (Signed)
 Patient c/o Palpitations:  STAT if patient reporting lightheadedness, shortness of breath, or chest pain  How long have you had palpitations/irregular HR/ Afib? Are you having the symptoms now?   No  Are you currently experiencing lightheadedness, SOB or CP?   Lightheaded  Do you have a history of afib (atrial fibrillation) or irregular heart rhythm?   Yes  Have you checked your BP or HR? (document readings if available):   No  Are you experiencing any other symptoms?   See below   Pt c/o medication issue:  1. Name of Medication:   HYDROcodone -acetaminophen  (NORCO/VICODIN) 5-325 MG tablet   2. How are you currently taking this medication (dosage and times per day)?   As prescribed  3. Are you having a reaction (difficulty breathing--STAT)?   4. What is your medication issue?   Patient stated she fell and broke her collar bone and was prescribed this medication.  Patient stated this medication makes her very weak and does not completely take away the pain.  Patient wants to know if she has been going in and out of Afib as she cannot tell.

## 2024-04-23 NOTE — Telephone Encounter (Signed)
 Remote transmission received. Patient advised no AF noted this month of November. Patient encouraged to call doctor who prescribed pain medication as it could be her BP. Patient agreeable and states she will contact them. Patient encouraged to take extra precaution while on pain medication. Patient was appreciative of call back.

## 2024-05-08 ENCOUNTER — Encounter

## 2024-05-09 ENCOUNTER — Ambulatory Visit: Attending: Cardiology

## 2024-05-09 DIAGNOSIS — I480 Paroxysmal atrial fibrillation: Secondary | ICD-10-CM | POA: Diagnosis not present

## 2024-05-09 LAB — CUP PACEART REMOTE DEVICE CHECK
Date Time Interrogation Session: 20251204232201
Implantable Pulse Generator Implant Date: 20210824

## 2024-05-13 NOTE — Progress Notes (Signed)
 Remote Loop Recorder Transmission

## 2024-05-15 ENCOUNTER — Ambulatory Visit: Payer: Self-pay | Admitting: Cardiology

## 2024-05-15 DIAGNOSIS — S42021D Displaced fracture of shaft of right clavicle, subsequent encounter for fracture with routine healing: Secondary | ICD-10-CM | POA: Diagnosis not present

## 2024-06-09 ENCOUNTER — Ambulatory Visit: Attending: Cardiology

## 2024-06-09 DIAGNOSIS — I480 Paroxysmal atrial fibrillation: Secondary | ICD-10-CM

## 2024-06-10 ENCOUNTER — Ambulatory Visit: Payer: Self-pay | Admitting: Cardiology

## 2024-06-10 LAB — CUP PACEART REMOTE DEVICE CHECK
Date Time Interrogation Session: 20260104230248
Implantable Pulse Generator Implant Date: 20210824

## 2024-06-13 NOTE — Progress Notes (Signed)
 Remote Loop Recorder Transmission

## 2024-07-10 ENCOUNTER — Ambulatory Visit

## 2024-07-10 LAB — CUP PACEART REMOTE DEVICE CHECK
Date Time Interrogation Session: 20260204230147
Implantable Pulse Generator Implant Date: 20210824

## 2024-08-10 ENCOUNTER — Ambulatory Visit
